# Patient Record
Sex: Male | Born: 1945
Health system: Southern US, Community
[De-identification: ages and names within clinical notes are randomized; demographics above are authoritative.]

## PROBLEM LIST (undated history)

## (undated) DIAGNOSIS — E119 Type 2 diabetes mellitus without complications: Secondary | ICD-10-CM

## (undated) DIAGNOSIS — I951 Orthostatic hypotension: Secondary | ICD-10-CM

## (undated) DIAGNOSIS — E669 Obesity, unspecified: Secondary | ICD-10-CM

## (undated) DIAGNOSIS — N049 Nephrotic syndrome with unspecified morphologic changes: Secondary | ICD-10-CM

## (undated) DIAGNOSIS — IMO0001 Reserved for inherently not codable concepts without codable children: Secondary | ICD-10-CM

## (undated) DIAGNOSIS — IMO0002 Reserved for concepts with insufficient information to code with codable children: Secondary | ICD-10-CM

## (undated) DIAGNOSIS — J45909 Unspecified asthma, uncomplicated: Secondary | ICD-10-CM

## (undated) DIAGNOSIS — Z8739 Personal history of other diseases of the musculoskeletal system and connective tissue: Secondary | ICD-10-CM

## (undated) DIAGNOSIS — I272 Pulmonary hypertension, unspecified: Secondary | ICD-10-CM

## (undated) DIAGNOSIS — I10 Essential (primary) hypertension: Secondary | ICD-10-CM

## (undated) DIAGNOSIS — R0602 Shortness of breath: Secondary | ICD-10-CM

## (undated) DIAGNOSIS — J45998 Other asthma: Secondary | ICD-10-CM

## (undated) DIAGNOSIS — E785 Hyperlipidemia, unspecified: Secondary | ICD-10-CM

## (undated) DIAGNOSIS — R001 Bradycardia, unspecified: Secondary | ICD-10-CM

## (undated) DIAGNOSIS — I38 Endocarditis, valve unspecified: Secondary | ICD-10-CM

## (undated) DIAGNOSIS — G4730 Sleep apnea, unspecified: Secondary | ICD-10-CM

## (undated) DIAGNOSIS — K5900 Constipation, unspecified: Secondary | ICD-10-CM

## (undated) DIAGNOSIS — N184 Chronic kidney disease, stage 4 (severe): Secondary | ICD-10-CM

## (undated) DIAGNOSIS — I493 Ventricular premature depolarization: Secondary | ICD-10-CM

## (undated) DIAGNOSIS — R609 Edema, unspecified: Secondary | ICD-10-CM

## (undated) DIAGNOSIS — M2550 Pain in unspecified joint: Secondary | ICD-10-CM

## (undated) DIAGNOSIS — M1990 Unspecified osteoarthritis, unspecified site: Secondary | ICD-10-CM

## (undated) DIAGNOSIS — M25519 Pain in unspecified shoulder: Secondary | ICD-10-CM

## (undated) DIAGNOSIS — I4891 Unspecified atrial fibrillation: Secondary | ICD-10-CM

## (undated) DIAGNOSIS — C609 Malignant neoplasm of penis, unspecified: Secondary | ICD-10-CM

## (undated) HISTORY — DX: Obesity, unspecified: E66.9

## (undated) HISTORY — DX: Pulmonary hypertension, unspecified: I27.20

## (undated) HISTORY — PX: BUNIONECTOMY WITH HAMMERTOE RECONSTRUCTION: SHX5600

## (undated) HISTORY — DX: Chronic kidney disease, stage 4 (severe): N18.4

## (undated) HISTORY — DX: Bradycardia, unspecified: R00.1

## (undated) HISTORY — PX: CATARACT EXTRACTION: SUR2

## (undated) HISTORY — DX: Shortness of breath: R06.02

## (undated) HISTORY — DX: Ventricular premature depolarization: I49.3

## (undated) HISTORY — PX: HERNIA REPAIR: SHX51

## (undated) HISTORY — DX: Nephrotic syndrome with unspecified morphologic changes: N04.9

## (undated) HISTORY — DX: Pain in unspecified shoulder: M25.519

## (undated) HISTORY — DX: Essential (primary) hypertension: I10

## (undated) HISTORY — DX: Unspecified asthma, uncomplicated: J45.909

## (undated) HISTORY — DX: Endocarditis, valve unspecified: I38

## (undated) HISTORY — DX: Orthostatic hypotension: I95.1

## (undated) HISTORY — DX: Edema, unspecified: R60.9

## (undated) HISTORY — DX: Pain in unspecified joint: M25.50

## (undated) HISTORY — DX: Unspecified atrial fibrillation: I48.91

## (undated) HISTORY — DX: Constipation, unspecified: K59.00

## (undated) HISTORY — PX: SQUAMOUS CELL CARCINOMA EXCISION: SHX2433

## (undated) HISTORY — DX: Sleep apnea, unspecified: G47.30

## (undated) HISTORY — DX: Reserved for inherently not codable concepts without codable children: IMO0001

---

## 2000-10-11 ENCOUNTER — Encounter: Admission: RE | Admit: 2000-10-11 | Discharge: 2001-01-09 | Payer: Self-pay | Admitting: Family Medicine

## 2001-05-16 ENCOUNTER — Encounter: Admission: RE | Admit: 2001-05-16 | Discharge: 2001-08-14 | Payer: Self-pay | Admitting: Family Medicine

## 2001-10-31 ENCOUNTER — Encounter: Admission: RE | Admit: 2001-10-31 | Discharge: 2002-01-29 | Payer: Self-pay | Admitting: Family Medicine

## 2002-02-19 ENCOUNTER — Encounter: Admission: RE | Admit: 2002-02-19 | Discharge: 2002-05-20 | Payer: Self-pay | Admitting: Family Medicine

## 2002-05-21 ENCOUNTER — Encounter: Admission: RE | Admit: 2002-05-21 | Discharge: 2002-08-19 | Payer: Self-pay | Admitting: Family Medicine

## 2002-09-16 ENCOUNTER — Ambulatory Visit (HOSPITAL_COMMUNITY): Admission: RE | Admit: 2002-09-16 | Discharge: 2002-09-16 | Payer: Self-pay | Admitting: Gastroenterology

## 2005-11-04 ENCOUNTER — Ambulatory Visit (HOSPITAL_BASED_OUTPATIENT_CLINIC_OR_DEPARTMENT_OTHER): Admission: RE | Admit: 2005-11-04 | Discharge: 2005-11-04 | Payer: Self-pay | Admitting: Surgery

## 2005-11-04 HISTORY — PX: UMBILICAL HERNIA REPAIR: SHX196

## 2008-05-07 ENCOUNTER — Encounter: Admission: RE | Admit: 2008-05-07 | Discharge: 2008-05-07 | Payer: Self-pay | Admitting: Nephrology

## 2008-05-16 ENCOUNTER — Ambulatory Visit (HOSPITAL_COMMUNITY): Admission: RE | Admit: 2008-05-16 | Discharge: 2008-05-16 | Payer: Self-pay | Admitting: Nephrology

## 2008-05-16 ENCOUNTER — Encounter (INDEPENDENT_AMBULATORY_CARE_PROVIDER_SITE_OTHER): Payer: Self-pay | Admitting: Interventional Radiology

## 2010-08-16 LAB — CBC
Hemoglobin: 16.4 g/dL (ref 13.0–17.0)
RBC: 5.37 MIL/uL (ref 4.22–5.81)
WBC: 5.6 10*3/uL (ref 4.0–10.5)

## 2010-08-16 LAB — GLUCOSE, CAPILLARY
Glucose-Capillary: 93 mg/dL (ref 70–99)
Glucose-Capillary: 98 mg/dL (ref 70–99)

## 2010-08-16 LAB — PROTIME-INR
INR: 1 (ref 0.00–1.49)
Prothrombin Time: 13.4 seconds (ref 11.6–15.2)

## 2010-08-16 LAB — APTT: aPTT: 32 seconds (ref 24–37)

## 2010-09-17 NOTE — Op Note (Signed)
   NAME:  DRADEN, RUMLEY NO.:  [id number]   MEDICAL RECORD NO.:  [id number]                   PATIENT TYPE:  AMB   LOCATION:  ENDO                                 FACILITY:  [facility]   PHYSICIAN:  Earle Gell, M.D.                DATE OF BIRTH:  [birth year]   DATE OF PROCEDURE:  09/16/2002  DATE OF DISCHARGE:                                 OPERATIVE REPORT   PROCEDURE:  Colonoscopy.   INDICATIONS FOR PROCEDURE:  Mr. Mark Singh is a 66 year old male  born 04-Mar-1946. Mr. Marchetta is scheduled to undergo his first  screening colonoscopy with polypectomy to prevent colon cancer.   ENDOSCOPIST:  Garlan Fair, M.D.   PREMEDICATION:  Versed 7.5 mg, Demerol 50 mg .   DESCRIPTION OF PROCEDURE:  After obtaining informed consent, Mr. Shawhan  was placed in the left lateral decubitus position. I administered  intravenous Demerol and intravenous Versed to achieve conscious sedation for  the procedure. The patient's blood pressure, oxygen saturation and cardiac  rhythm were monitored throughout the procedure and documented in the medical  record.   Anal inspection was normal. Digital rectal exam was normal. The Olympus  adult colonoscope was introduced into the rectum and advanced to the cecum.  A normal appearing ileocecal valve was intubated and the distal ileum  inspected.  Colonic preparation for the exam today was excellent.   RECTUM:  Normal.   SIGMOID COLON AND DESCENDING COLON:  Normal.   SPLENIC FLEXURE:  Normal.   TRANSVERSE COLON:  Normal.   HEPATIC FLEXURE:  Normal.   ASCENDING COLON:  Normal.   CECUM AND ILEOCECAL VALVE:  Normal.   DISTAL ILEUM:  Normal.    ASSESSMENT:  Normal screening proctocolonoscopy to the cecum with inspection  of the distal ileum. No endoscopic evidence for the presence of colorectal  neoplasia.                                               Earle Gell, M.D.    MJ/MEDQ   D:  09/16/2002  T:  09/16/2002  Job:  [id number]   cc:   Darletta Moll. Bubba Hales, M.D.  [address]  [place]  [place] [address]  Fax: [phone]

## 2010-09-17 NOTE — Op Note (Signed)
NAME:  Mark Singh, Mark Singh NO.:  [id number]   MEDICAL RECORD NO.:  [id number]          PATIENT TYPE:  AMB   LOCATION:  [facility]                         FACILITY:  [facility]   PHYSICIAN:  Thomas A. Cornett, M.D.DATE OF BIRTH:  Mar 03, 1946   DATE OF PROCEDURE:  11/04/2005  DATE OF DISCHARGE:                                 OPERATIVE REPORT   PREOPERATIVE DIAGNOSIS:  Umbilical hernia.   POSTOP DIAGNOSIS:  Umbilical hernia.   PROCEDURE:  Umbilical hernia pair with the mesh.   SURGEON:  Erroll Luna, M.D.   ANESTHESIA:  LMA with 10 mL of 0.5% Sensorcaine local with epinephrine.   ESTIMATED BLOOD LOSS:  10 mL.   SPECIMENS:  None.   INDICATIONS FOR PROCEDURE:  The patient is a 65 year old male with a  umbilical hernia.  It was reducible but it was giving him symptoms and he  wished to have  it repaired.  I discussed the procedure with him, as well as  potential complications and he understood and agreed to proceed.   DESCRIPTION OF PROCEDURE:  The patient was brought to the operating room,  placed supine.  After induction of general endotracheal anesthesia, the  abdomen was prepped and draped in sterile fashion.  I infiltrated the skin  below his umbilicus with 10 mL of  0.5% Sensorcaine.  Incision was made.  Dissection was carried down and I was able to mobilize the umbilicus off the  fascia.  There was a roughly 1-cm defect noted.  I was able to clear away  the fascial edges well.  I placed my finger into the fascial defect and  swept away tissue from under the fascia for  mesh placement.  A 1.7 x 1.7  inch Ventralex mesh was brought on the field.  The smooth side was placed  taken toward the peritoneal cavity.  I then pulled the mesh up snug to the  undersurface of the fascia.  Four sutures of 0 Novofil were used to hold the  mesh in place.  I then closed the fascia mesh with 0 Novofil to cover it.  I  then used a 3-0 Vicryl to  tack the umbilicus down to the fascia.   4-0  Monocryl was used to close the  skin.  All sponge, needle, and instruments were found be correct for this  portion of the case.  Sterile dressings were applied.  The patient was awoke  and taken to recovery in satisfactory condition.      Thomas A. Cornett, M.D.  Electronically Signed     TAC/MEDQ  D:  11/04/2005  T:  11/04/2005  Job:  [id number]   cc:   Darletta Moll. Bubba Hales, M.D.  Fax: [phone]

## 2012-11-19 ENCOUNTER — Encounter (HOSPITAL_BASED_OUTPATIENT_CLINIC_OR_DEPARTMENT_OTHER): Payer: Self-pay | Admitting: *Deleted

## 2012-11-19 ENCOUNTER — Other Ambulatory Visit: Payer: Self-pay | Admitting: Urology

## 2012-11-21 ENCOUNTER — Encounter (HOSPITAL_BASED_OUTPATIENT_CLINIC_OR_DEPARTMENT_OTHER): Payer: Self-pay | Admitting: *Deleted

## 2012-11-21 NOTE — Progress Notes (Signed)
NPO AFTER MN. ARRIVES AT 0600. NEEDS ISTAT AND EKG. 

## 2012-11-26 NOTE — Anesthesia Preprocedure Evaluation (Addendum)
Anesthesia Evaluation  Patient identified by MRN, date of birth, ID band Patient awake    Reviewed: Allergy & Precautions, H&P , NPO status , Patient's Chart, lab work & pertinent test results  Airway Mallampati: II TM Distance: >3 FB Neck ROM: full    Dental no notable dental hx. (+) Teeth Intact and Dental Advisory Given   Pulmonary asthma ,  Seasonal asthma breath sounds clear to auscultation  Pulmonary exam normal       Cardiovascular Exercise Tolerance: Good negative cardio ROS  Rhythm:regular Rate:Normal     Neuro/Psych negative neurological ROS  negative psych ROS   GI/Hepatic negative GI ROS, Neg liver ROS,   Endo/Other  diabetes, Well Controlled, Type 2, Oral Hypoglycemic Agents  Renal/GU negative Renal ROS  negative genitourinary   Musculoskeletal   Abdominal   Peds  Hematology negative hematology ROS (+)   Anesthesia Other Findings   Reproductive/Obstetrics negative OB ROS                         Anesthesia Physical Anesthesia Plan  ASA: III  Anesthesia Plan: General   Post-op Pain Management:    Induction: Intravenous  Airway Management Planned: LMA  Additional Equipment:   Intra-op Plan:   Post-operative Plan:   Informed Consent: I have reviewed the patients History and Physical, chart, labs and discussed the procedure including the risks, benefits and alternatives for the proposed anesthesia with the patient or authorized representative who has indicated his/her understanding and acceptance.   Dental Advisory Given  Plan Discussed with: CRNA and Surgeon  Anesthesia Plan Comments:         Anesthesia Quick Evaluation

## 2012-11-26 NOTE — H&P (Signed)
History of Present Illness        Patient seen in consult for foreskin mass referred by Dr. Kathyrn Lass Jul 2014. He has noticed a growth on the foreskin about the size of a pencil eraser for about 6 months. He tried nystatin and triamcinolone ointment with no improvement. He hasnt noted anything to make it worse. There are no associated sign or symptoms. He saw Dr. Terance Hart in 2009 for penile irritation of the glans penis that had occured since 2005. He had been treated with steroids and nystatin cream. He had cracking and splitting of the foreskin.   He has a good flow. No dysuria or hematuria. No excessive frequency or urgency. He has occasional urgency after lasix.   He works as a Development worker, international aid. He is here with his wife, they are not sexually active. He has had squamous cell ca.    Past Medical History Problems  1. History of  Arthritis V13.4 2. History of  Asthma 493.90 3. History of  Diabetes Mellitus 250.00 4. History of  Gout 274.9 5. History of  Hypercholesterolemia 272.0 6. History of  Renal Disease 593.9 7. History of  Skin Cancer V10.83  Surgical History Problems  1. History of  Biopsy Skin 2. History of  Foot Surgery Left 3. History of  Foot Surgery Left 4. History of  Umbilical Hernia Repair  Current Meds 1. Advair Diskus 100-50 MCG/DOSE MISC; Therapy: (Recorded:10Dec2009) to 2. Aspirin 81 MG Oral Tablet; Therapy: (Recorded:10Dec2009) to 3. Elidel 1 % External Cream; Therapy: (Recorded:10Dec2009) to 4. Furosemide 80 MG Oral Tablet; Therapy: 14Apr2014 to 5. MetFORMIN HCl ER 500 MG Oral Tablet Extended Release 24 Hour; Therapy:  (Recorded:21Jul2014) to 6. Nystatin-Triamcinolone CREA; Therapy: (Recorded:10Dec2009) to 7. Probenecid 500 MG Oral Tablet; Therapy: [id number] to 8. Vytorin 10-40 MG Oral Tablet; Therapy: (Recorded:10Dec2009) to  Allergies Medication  1. No Known Drug Allergies  Family History Problems  1. Fraternal history of  Acute Myocardial Infarction  V17.3 2. Sororal history of  Arthritis V17.7 3. Fraternal history of  Arthritis V17.7 4. Paternal history of  Death In The Family Father StrokeAge 10-04-1972. Maternal history of  Death In The Family Mother Complications from broken hipAge 84 6. Fraternal history of  Diabetes Mellitus V18.0 7. Sororal history of  Diabetes Mellitus V18.0 8. Family history of  Family Health Status Number Of Children No children 9. Maternal history of  Reported Previous Cardiac Problems 10. Paternal history of  Stroke Syndrome V17.1  Social History Problems    Daily Cola Consumption (___ Cans/Day) 3 per day   Marital History - Currently Married   Occupation: Landscaper Denied    History of  Alcohol Use   History of  Tobacco Use  Review of Systems Genitourinary, constitutional, skin, eye, otolaryngeal, hematologic/lymphatic, cardiovascular, pulmonary, endocrine, musculoskeletal, gastrointestinal, neurological and psychiatric system(s) were reviewed and pertinent findings if present are noted.  Hematologic/Lymphatic: a tendency to easily bruise.  Respiratory: shortness of breath.  Musculoskeletal: joint pain.  Neurological: dizziness.    Vitals Vital Signs [Data Includes: Last 1 Day]  21Jul2014 09:52AM  BMI Calculated: 36.14 BSA Calculated: 2.25 Height: 5 ft 9 in Weight: 244 lb  Blood Pressure: 139 / 81 Temperature: 98 F Heart Rate: 60  Physical Exam Constitutional: Well nourished and well developed . No acute distress.  ENT:. The ears and nose are normal in appearance.  Neck: The appearance of the neck is normal and no neck mass is present.  Pulmonary: No respiratory distress and normal respiratory  rhythm and effort.  Cardiovascular: Heart rate and rhythm are normal . No peripheral edema.  Abdomen: The abdomen is soft and nontender. No masses are palpated. No CVA tenderness. No hernias are palpable. No hepatosplenomegaly noted.  Rectal: Rectal exam demonstrates normal sphincter tone, no  tenderness and no masses. The prostate has no nodularity and is not tender. The left seminal vesicle is nonpalpable. The right seminal vesicle is nonpalpable. The perineum is normal on inspection.  Genitourinary: Examination of the penis demonstrates no discharge, no masses and a normal meatus. Penile lesion: Multiple papule(s) noted.  Large warty lesion on left dorsal foreskin, 1-2 cm, raised possbile involvement of glans penis but difficult to tell with some phimosis. Glans has some erythema and possible mass. The scrotum is without lesions. The right epididymis is palpably normal and non-tender. The left epididymis is palpably normal and non-tender. The right testis is non-tender and without masses. The left testis is non-tender and without masses.  Lymphatics: The femoral and inguinal nodes are not enlarged or tender.  Skin: Normal skin turgor, no visible rash and no visible skin lesions.  Neuro/Psych:. Mood and affect are appropriate.    Results/Data Urine [Data Includes: Last 1 Day]   [id number]  COLOR YELLOW   APPEARANCE CLEAR   SPECIFIC GRAVITY 1.020   pH 5.5   GLUCOSE NEG mg/dL  BILIRUBIN NEG   KETONE NEG mg/dL  BLOOD NEG   PROTEIN NEG mg/dL  UROBILINOGEN 0.2 mg/dL  NITRITE NEG   LEUKOCYTE ESTERASE NEG    Assessment Assessed  1. Phimosis 605 2. Penile Neoplasm 239.5  Plan Health Maintenance (V70.0)  1. UA With REFLEX  Done: [id number] 09:33AM Penile Neoplasm (239.5)  2. Follow-up Schedule Surgery Office  Follow-up  Requested for: 21Jul2014 3. Get Outside Records Office  Follow-up  Requested for: [phone]  Discussion/Summary   Discussed with patient and his wife this is concerning for penile cancer but could be a benign lesion such as a large condyloma.  Plan mass excision, possible circumcision, poss frozen, poss excision of glans mass/tumor. Discussed risks such as poor cosmesis and sexual dysfunction postoperatively among others. Discussed may need multiple procedures  depending on path, referral to tertiary referral center. I didn' t note any inguinal LAD.       Signatures Electronically signed by : Festus Aloe, M.D.; Nov 19 2012 11:01AM

## 2012-11-27 ENCOUNTER — Encounter (HOSPITAL_BASED_OUTPATIENT_CLINIC_OR_DEPARTMENT_OTHER)
Admission: RE | Disposition: A | Discharge status: Home / Self Care | Payer: Self-pay | Source: Ambulatory Visit | Attending: Urology

## 2012-11-27 ENCOUNTER — Ambulatory Visit (HOSPITAL_BASED_OUTPATIENT_CLINIC_OR_DEPARTMENT_OTHER)
Admission: RE | Admit: 2012-11-27 | Discharge: 2012-11-27 | Disposition: A | Discharge status: Home / Self Care | Payer: Medicare Other | Source: Ambulatory Visit | Attending: Urology | Admitting: Urology

## 2012-11-27 ENCOUNTER — Other Ambulatory Visit: Payer: Self-pay

## 2012-11-27 ENCOUNTER — Encounter (HOSPITAL_BASED_OUTPATIENT_CLINIC_OR_DEPARTMENT_OTHER): Payer: Self-pay | Admitting: *Deleted

## 2012-11-27 ENCOUNTER — Encounter (HOSPITAL_BASED_OUTPATIENT_CLINIC_OR_DEPARTMENT_OTHER): Payer: Self-pay | Admitting: Anesthesiology

## 2012-11-27 ENCOUNTER — Ambulatory Visit (HOSPITAL_BASED_OUTPATIENT_CLINIC_OR_DEPARTMENT_OTHER): Payer: Medicare Other | Admitting: Anesthesiology

## 2012-11-27 DIAGNOSIS — M109 Gout, unspecified: Secondary | ICD-10-CM | POA: Insufficient documentation

## 2012-11-27 DIAGNOSIS — Z79899 Other long term (current) drug therapy: Secondary | ICD-10-CM | POA: Insufficient documentation

## 2012-11-27 DIAGNOSIS — N476 Balanoposthitis: Secondary | ICD-10-CM | POA: Insufficient documentation

## 2012-11-27 DIAGNOSIS — J45909 Unspecified asthma, uncomplicated: Secondary | ICD-10-CM | POA: Insufficient documentation

## 2012-11-27 DIAGNOSIS — Z85828 Personal history of other malignant neoplasm of skin: Secondary | ICD-10-CM | POA: Insufficient documentation

## 2012-11-27 DIAGNOSIS — C600 Malignant neoplasm of prepuce: Secondary | ICD-10-CM | POA: Insufficient documentation

## 2012-11-27 DIAGNOSIS — E119 Type 2 diabetes mellitus without complications: Secondary | ICD-10-CM | POA: Insufficient documentation

## 2012-11-27 DIAGNOSIS — C601 Malignant neoplasm of glans penis: Secondary | ICD-10-CM | POA: Insufficient documentation

## 2012-11-27 DIAGNOSIS — Z7982 Long term (current) use of aspirin: Secondary | ICD-10-CM | POA: Insufficient documentation

## 2012-11-27 DIAGNOSIS — E7800 Pure hypercholesterolemia, unspecified: Secondary | ICD-10-CM | POA: Insufficient documentation

## 2012-11-27 HISTORY — DX: Personal history of other diseases of the musculoskeletal system and connective tissue: Z87.39

## 2012-11-27 HISTORY — DX: Unspecified osteoarthritis, unspecified site: M19.90

## 2012-11-27 HISTORY — DX: Type 2 diabetes mellitus without complications: E11.9

## 2012-11-27 HISTORY — DX: Other asthma: J45.998

## 2012-11-27 HISTORY — PX: CIRCUMCISION: SHX1350

## 2012-11-27 HISTORY — DX: Hyperlipidemia, unspecified: E78.5

## 2012-11-27 LAB — GLUCOSE, CAPILLARY: Glucose-Capillary: 91 mg/dL (ref 70–99)

## 2012-11-27 LAB — POCT I-STAT 4, (NA,K, GLUC, HGB,HCT)
Glucose, Bld: 107 mg/dL — ABNORMAL HIGH (ref 70–99)
Potassium: 3.6 mEq/L (ref 3.5–5.1)
Sodium: 142 mEq/L (ref 135–145)

## 2012-11-27 SURGERY — CIRCUMCISION, ADULT
Anesthesia: General | Site: Penis | Wound class: Clean

## 2012-11-27 MED ORDER — BUPIVACAINE HCL 0.25 % IJ SOLN
INTRAMUSCULAR | Status: DC | PRN
  Administered 2012-11-27: 10 mL

## 2012-11-27 MED ORDER — PROPOFOL 10 MG/ML IV BOLUS
INTRAVENOUS | Status: DC | PRN
  Administered 2012-11-27: 150 mg via INTRAVENOUS

## 2012-11-27 MED ORDER — ONDANSETRON HCL 4 MG/2ML IJ SOLN
INTRAMUSCULAR | Status: DC | PRN
  Administered 2012-11-27: 4 mg via INTRAVENOUS

## 2012-11-27 MED ORDER — LACTATED RINGERS IV SOLN
INTRAVENOUS | Status: DC
  Administered 2012-11-27: 07:00:00 via INTRAVENOUS
  Filled 2012-11-27: qty 1000

## 2012-11-27 MED ORDER — MIDAZOLAM HCL 5 MG/5ML IJ SOLN
INTRAMUSCULAR | Status: DC | PRN
  Administered 2012-11-27: 1 mg via INTRAVENOUS

## 2012-11-27 MED ORDER — OXYCODONE-ACETAMINOPHEN 5-325 MG PO TABS: 1.0000 | ORAL_TABLET | ORAL | Status: DC | PRN

## 2012-11-27 MED ORDER — CEFAZOLIN SODIUM-DEXTROSE 2-3 GM-% IV SOLR
2.0000 g | INTRAVENOUS | Status: AC
  Administered 2012-11-27: 2 g via INTRAVENOUS
  Filled 2012-11-27: qty 50

## 2012-11-27 MED ORDER — FENTANYL CITRATE 0.05 MG/ML IJ SOLN
25.0000 ug | INTRAMUSCULAR | Status: DC | PRN
  Filled 2012-11-27: qty 1

## 2012-11-27 MED ORDER — FENTANYL CITRATE 0.05 MG/ML IJ SOLN
INTRAMUSCULAR | Status: DC | PRN
  Administered 2012-11-27: 25 ug via INTRAVENOUS
  Administered 2012-11-27: 50 ug via INTRAVENOUS
  Administered 2012-11-27: 25 ug via INTRAVENOUS

## 2012-11-27 MED ORDER — LIDOCAINE HCL (CARDIAC) 20 MG/ML IV SOLN
INTRAVENOUS | Status: DC | PRN
  Administered 2012-11-27: 50 mg via INTRAVENOUS

## 2012-11-27 MED ORDER — LACTATED RINGERS IV SOLN
INTRAVENOUS | Status: DC
  Administered 2012-11-27: 10:00:00 via INTRAVENOUS
  Filled 2012-11-27: qty 1000

## 2012-11-27 MED ORDER — ASPIRIN 81 MG PO TABS: 81.0000 mg | ORAL_TABLET | Freq: Every day | ORAL | Status: DC

## 2012-11-27 SURGICAL SUPPLY — 28 items
BANDAGE CONFORM 2  STR LF (GAUZE/BANDAGES/DRESSINGS) ×2 IMPLANT
BLADE SURG 15 STRL LF DISP TIS (BLADE) ×1 IMPLANT
BLADE SURG 15 STRL SS (BLADE) ×1
BNDG COHESIVE 1X5 TAN STRL LF (GAUZE/BANDAGES/DRESSINGS) ×2 IMPLANT
CLOTH BEACON ORANGE TIMEOUT ST (SAFETY) ×2 IMPLANT
COVER MAYO STAND STRL (DRAPES) ×2 IMPLANT
COVER TABLE BACK 60X90 (DRAPES) ×2 IMPLANT
DRAIN PENROSE 18X1/4 LTX STRL (WOUND CARE) ×2 IMPLANT
DRAPE PED LAPAROTOMY (DRAPES) ×2 IMPLANT
ELECT REM PT RETURN 9FT ADLT (ELECTROSURGICAL) ×2
ELECTRODE REM PT RTRN 9FT ADLT (ELECTROSURGICAL) ×1 IMPLANT
GAUZE VASELINE 1X8 (GAUZE/BANDAGES/DRESSINGS) ×2 IMPLANT
GLOVE BIO SURGEON STRL SZ7.5 (GLOVE) ×2 IMPLANT
GLOVE ECLIPSE 6.5 STRL STRAW (GLOVE) ×2 IMPLANT
GLOVE INDICATOR 6.5 STRL GRN (GLOVE) ×2 IMPLANT
GOWN PREVENTION PLUS LG XLONG (DISPOSABLE) ×2 IMPLANT
GOWN STRL REIN XL XLG (GOWN DISPOSABLE) ×2 IMPLANT
NEEDLE HYPO 25X1 1.5 SAFETY (NEEDLE) ×2 IMPLANT
NS IRRIG 500ML POUR BTL (IV SOLUTION) ×2 IMPLANT
PACK BASIN DAY SURGERY FS (CUSTOM PROCEDURE TRAY) ×2 IMPLANT
PENCIL BUTTON HOLSTER BLD 10FT (ELECTRODE) ×2 IMPLANT
SUT CHROMIC 3 0 PS 2 (SUTURE) IMPLANT
SUT CHROMIC 3 0 SH 27 (SUTURE) ×4 IMPLANT
SUT CHROMIC 4 0 RB 1X27 (SUTURE) ×2 IMPLANT
SUT SILK 2 0 (SUTURE)
SUT SILK 2-0 18XBRD TIE 12 (SUTURE) IMPLANT
SYR CONTROL 10ML LL (SYRINGE) ×2 IMPLANT
WATER STERILE IRR 500ML POUR (IV SOLUTION) IMPLANT

## 2012-11-27 NOTE — Interval H&P Note (Signed)
History and Physical Interval Note:  11/27/2012 7:26 AM  Mark Singh  has presented today for surgery, with the diagnosis of penile neoplasm  The various methods of treatment have been discussed with the patient and family. After consideration of risks, benefits and other options for treatment, the patient has consented to  Procedure(s): [place]  (N/A) as a surgical intervention .  The patient's history has been reviewed, patient examined, no change in status, stable for surgery.  I have reviewed the patient's chart and labs.  Questions were answered to the patient's satisfaction.  I told him we'd probably perform a circumcision with some other biopsies to stage the process. We may or may not need frozens. Istat is normal.    Fredricka Bonine

## 2012-11-27 NOTE — Op Note (Signed)
Preop diagnosis: #1 foreskin neoplasm #2 glans neoplasm #3 severe balanoposthitis  Postop diagnosis: Same  Procedure: #1 excisional biopsy of glans #2 circumcision including foreskin neoplasm #3 exam under anesthesia  Surgeon: Vyctoria Dickman Type of anesthesia: Gen. with a penile block  Findings: There was severe balanoposthitis with complete fusion of the preputial surface covering the back 1/3 to 1/2 of the glans dorsally and no real plain could be made here. There was a raised plaque-like lesion with a fine papillary appearance on the glans. In line with this glans mass and touching it was a raised keratinized warty lesion of the foreskin. There was some remaining significant inflammation and erythema of the glans penis and some of the preputial skin but this did not appear to be neoplastic (there were no well marginated velvety red areas).  There was no palpable inguinal lymphadenopathy.  Description of procedure: After consent was obtained patient brought to the operating room. After adequate anesthesia he was prepped and draped in the usual fashion. A timeout was performed to confirm the patient and procedure. Along this fused to preputial dorsal surface of the glans I developed a plane sharply and bluntly to began coming under the penile skin. A 10 cc Marcaine dorsal penile block was given to the proximal penis. A light tourniquet was placed with a Penrose. There was again a crusty plaque-like lesion on the glans which was incised elliptically and removed. There was another area of suspicion contiguous with this and this was excised. These were sent as specimens 1 and 2. The wound was irrigated. The glans defect was then run closed with a 4-0 chromic suture.   Next I marked a line for the circumcision. The proximal incision was marked proximal to the suspicious foreskin lesion, and around more distal on the ventral surface. The mucosal marked included all the epithelium visible on the dorsal  glans to include the plain I developed earlier to come under the lesion. Laterally on both sides I was able to incorporate some normal foreskin and the incision down ventrally. The distal incision was made. Then the proximal incision. I started ventrally to develop a plane under the dartos fascia right on top of the urethra and corporal bodies more laterally. This plane was brought up dorsally with cautery dissection as well as sharp dissection.  I was able to completely come under the concerning lesion. The foreskin mass and foreskin were sent to path as #3. In the area where the mucosal surface was fused to the glans under the foreskin mass I took some more tissue as a deep margin as #4. The tourniquet was released. Hemostasis was obtained with the Bovie cautery and was excellent. The wound was irrigated. The penile skin and preputial skin was then reapproximated with interrupted 4-0 chromic. More dorsally the penile skin was attached directly to the glans again as this area suffered complete fusion.   A dressing of Neosporin, Vaseline gauze and Kling wrap was placed. The drapes were removed the patient was cleaned. An exam under anesthesia the inguinal nodes was performed.  Complications: None  Blood loss: Minimal  Drains: None   Specimens:  #1 glans lesion  #2 glans lesion  #3 foreskin lesion and foreskin from circumcision  #4 deep margin of  Foreskin  Disposition: Patient stable to PACU.

## 2012-11-27 NOTE — Transfer of Care (Signed)
Immediate Anesthesia Transfer of Care Note  Patient: Mark Singh  Procedure(s) Performed: Procedure(s):  CIRCUMCISION  AND EXCISION OF GLANS PENIS (N/A)  Patient Location: PACU  Anesthesia Type:General  Level of Consciousness: sedated  Airway & Oxygen Therapy: Patient Spontanous Breathing and Patient connected to nasal cannula oxygen  Post-op Assessment: Report given to PACU RN and Post -op Vital signs reviewed and stable  Post vital signs: stable  Complications: No apparent anesthesia complications

## 2012-11-27 NOTE — Anesthesia Postprocedure Evaluation (Signed)
  Anesthesia Post-op Note  Patient: Mark Singh  Procedure(s) Performed: Procedure(s) (LRB):  CIRCUMCISION  AND EXCISION OF GLANS PENIS (N/A)  Patient Location: PACU  Anesthesia Type: General  Level of Consciousness: awake and alert   Airway and Oxygen Therapy: Patient Spontanous Breathing  Post-op Pain: mild  Post-op Assessment: Post-op Vital signs reviewed, Patient's Cardiovascular Status Stable, Respiratory Function Stable, Patent Airway and No signs of Nausea or vomiting  Last Vitals:  Filed Vitals:   11/27/12 0945  BP: 105/92  Pulse: 60  Temp:   Resp: 13    Post-op Vital Signs: stable   Complications: No apparent anesthesia complications

## 2012-11-28 ENCOUNTER — Encounter (HOSPITAL_BASED_OUTPATIENT_CLINIC_OR_DEPARTMENT_OTHER): Payer: Self-pay | Admitting: Urology

## 2012-12-03 ENCOUNTER — Other Ambulatory Visit: Payer: Self-pay | Admitting: Dermatology

## 2012-12-05 ENCOUNTER — Other Ambulatory Visit: Payer: Self-pay

## 2013-03-07 ENCOUNTER — Other Ambulatory Visit: Payer: Self-pay

## 2013-04-29 ENCOUNTER — Encounter: Payer: Self-pay | Admitting: Cardiology

## 2013-04-29 DIAGNOSIS — R609 Edema, unspecified: Secondary | ICD-10-CM | POA: Insufficient documentation

## 2013-04-29 DIAGNOSIS — I10 Essential (primary) hypertension: Secondary | ICD-10-CM | POA: Insufficient documentation

## 2013-04-29 DIAGNOSIS — I951 Orthostatic hypotension: Secondary | ICD-10-CM | POA: Insufficient documentation

## 2013-05-07 ENCOUNTER — Encounter (INDEPENDENT_AMBULATORY_CARE_PROVIDER_SITE_OTHER): Payer: Medicare Other

## 2013-05-07 ENCOUNTER — Encounter: Payer: Self-pay | Admitting: *Deleted

## 2013-05-07 ENCOUNTER — Ambulatory Visit (INDEPENDENT_AMBULATORY_CARE_PROVIDER_SITE_OTHER): Payer: Medicare Other | Admitting: Cardiology

## 2013-05-07 ENCOUNTER — Encounter: Payer: Self-pay | Admitting: Cardiology

## 2013-05-07 VITALS — BP 143/58 | HR 66 | Ht 69.0 in | Wt 257.0 lb

## 2013-05-07 DIAGNOSIS — I4949 Other premature depolarization: Secondary | ICD-10-CM

## 2013-05-07 DIAGNOSIS — E669 Obesity, unspecified: Secondary | ICD-10-CM

## 2013-05-07 DIAGNOSIS — I493 Ventricular premature depolarization: Secondary | ICD-10-CM

## 2013-05-07 DIAGNOSIS — E785 Hyperlipidemia, unspecified: Secondary | ICD-10-CM

## 2013-05-07 DIAGNOSIS — I10 Essential (primary) hypertension: Secondary | ICD-10-CM

## 2013-05-07 LAB — BASIC METABOLIC PANEL
BUN: 13 mg/dL (ref 6–23)
CO2: 31 mEq/L (ref 19–32)
CREATININE: 1.4 mg/dL (ref 0.4–1.5)
Calcium: 8.7 mg/dL (ref 8.4–10.5)
Chloride: 99 mEq/L (ref 96–112)
GFR: 52.24 mL/min — AB (ref >60.00)
Glucose, Bld: 147 mg/dL — ABNORMAL HIGH (ref 70–99)
POTASSIUM: 3.9 meq/L (ref 3.5–5.1)
Sodium: 138 mEq/L (ref 135–145)

## 2013-05-07 LAB — T4, FREE: Free T4: 0.57 ng/dL — ABNORMAL LOW (ref 0.60–1.60)

## 2013-05-07 LAB — TSH: TSH: 3.45 u[IU]/mL (ref 0.35–5.50)

## 2013-05-07 NOTE — Patient Instructions (Addendum)
Your physician recommends that you continue on your current medications as directed. Please refer to the Current Medication list given to you today.  Your physician recommends that you have labs today: BMET, TSH, FreeT4   Your physician has recommended that you wear a 24 hour holter monitor. Holter monitors are medical devices that record the heart's electrical activity. Doctors most often use these monitors to diagnose arrhythmias. Arrhythmias are problems with the speed or rhythm of the heartbeat. The monitor is a small, portable device. You can wear one while you do your normal daily activities. This is usually used to diagnose what is causing palpitations/syncope (passing out).  Your physician wants you to follow-up in: 6 months with Dr. Marlou Porch. You will receive a reminder letter in the mail two months in advance. If you don't receive a letter, please call our office to schedule the follow-up appointment.  *[place] Blood Pressure Cuff

## 2013-05-07 NOTE — Progress Notes (Signed)
Patient ID: Mark Singh, male   DOB: August 26, 1945, 68 y.o.   MRN: [id number] E=Cardio 24 hour holter monitor applied to patient.

## 2013-05-07 NOTE — Progress Notes (Signed)
[facility] [address], [place]  [address] Phone: [phone] Fax:  [phone]  Date:  05/07/2013   ID:  Mark Singh, DOB Oct 18, 1945, MRN [id number]  PCP:  [name], MD   History of Present Illness: Mark Singh is a 68 y.o. male with nephrotic syndrome a complete responder to steroid therapy by Dr. Lorrene Reid, diabetes, hypertension who is here for a one-year followup. His prior echocardiogram showed a normal EF with diastolic dysfunction and dilated left atrium. He no longer complains of any excessive dyspnea or chest burning/tightness. Prior nuclear stress test was low risk.He still continues to battle with obesity and is eager to lose weight. He continues to take his cholesterol medication. He is normotensive with blood pressures running in the 120s to 130s at all doctors visits.  In 2014 had skin cancer surgery. Tired all of the time at that point. HR at that point was in the 40's, during colonoscopy. ECG showed PVC's frequent. Personally viewed EKG which shows sinus rhythm with ventricular trigeminy. Upright in aVF, upright in V3 through V6, down in aVR.    Wt Readings from Last 3 Encounters:  05/07/13 257 lb (116.574 kg)  11/27/12 246 lb (111.585 kg)  11/27/12 246 lb (111.585 kg)     Past Medical History  Diagnosis Date  . Penile neoplasm   . Hyperlipidemia   . Type 2 diabetes mellitus   . H/O: gout     right foot  . Arthritis   . Seasonal asthma     mild  . Asthma   . HTN (hypertension)   . Orthostatic hypotension   . Myalgia and myositis, unspecified   . Edema     , ECHO 04/18/08 - Nl EF 65%, mild to moderate LAE, mild diastolic dysfunction  . Nephrotic syndrome   . Back pain   . Shoulder pain     Past Surgical History  Procedure Laterality Date  . Umbilical hernia repair  11-04-2005  . Umbilical hernia repair  11-04-2005  . Left foot surgery  1992  &  2008    REMOVAL HEAL SPUR/ BUNIONECTOMY AND HAMMERTOE REPAIR X2   . Circumcision N/A 11/27/2012    Procedure:  CIRCUMCISION  AND EXCISION OF GLANS PENIS;  Surgeon: Fredricka Bonine, MD;  Location[facility];  Service: Urology;  Laterality: N/A;  . Hammer toe surgery      Current Outpatient Prescriptions  Medication Sig Dispense Refill  . aspirin 81 MG tablet Take 1 tablet (81 mg total) by mouth daily.  30 tablet  0  . Fluticasone-Salmeterol (ADVAIR DISKUS) 250-50 MCG/DOSE AEPB Inhale 1 puff into the lungs as needed.      . furosemide (LASIX) 80 MG tablet Take 40 mg by mouth daily.       . metFORMIN (GLUCOPHAGE) 500 MG tablet Take 500 mg by mouth 2 (two) times daily with a meal.      . probenecid (BENEMID) 500 MG tablet Take 500 mg by mouth 2 (two) times daily.       . simvastatin (ZOCOR) 40 MG tablet Take 40 mg by mouth daily.       No current facility-administered medications for this visit.    Allergies:    Allergies  Allergen Reactions  . Indomethacin Other (See Comments)    dizziness    Social History:  The patient  reports that he has never smoked. He has never used smokeless tobacco. He reports that  he does not drink alcohol or use illicit drugs.   ROS:  Please see the history of present illness.   Denies any syncope, stroke like symptoms, orthopnea, PND, chest pain. Occasionally he will feel some slight dizziness.  PHYSICAL EXAM: VS:  BP 143/58  Pulse 66  Ht 5\' 9"  (1.753 m)  Wt 257 lb (116.574 kg)  BMI 37.93 kg/m2 Well nourished, well developed, in no acute distress HEENT: normal Neck: no JVD Cardiac:  normal S1, S2; RRR; no murmur occasional ectopy Lungs:  clear to auscultation bilaterally, no wheezing, rhonchi or rales Abd: soft, nontender, no hepatomegalyObese Ext: no edema Skin: warm and dry Neuro: no focal abnormalities noted  EKG:  04/22/13 from Dr. Ammie Ferrier office:  ECG showed PVC's frequent. Personally viewed EKG which shows sinus rhythm with ventricular trigeminy. Upright in aVF, upright in V3  through V6, down in aVR.   ASSESSMENT AND PLAN:  1. Frequent PVCs-unifocal. No syncope. Mild dizziness may be associated with lower effective heart rate. I would like to check a 24-hour Holter monitor to ensure that there no dangerous arrhythmias and to make sure that he does not have a significant PVC burden. I will also check a TSH, free T4 and basic metabolic profile. Previous echocardiogram performed showed normal ejection fraction without any significant left ventricular hypertrophy. Prior nuclear stress test was also low risk. 2. Obesity-encourage weight loss. 3. Hypertension-currently reasonably controlled. Recommended Omron blood pressure cuff. 4. Hyperlipidemia-simvastatin. Continue.  Signed, [name], MD [facility]  05/07/2013 1:12 PM

## 2013-05-13 ENCOUNTER — Telehealth: Payer: Self-pay | Admitting: *Deleted

## 2013-05-13 MED ORDER — METOPROLOL SUCCINATE ER 25 MG PO TB24: 25.0000 mg | ORAL_TABLET | Freq: Every day | ORAL | Status: DC

## 2013-05-13 NOTE — Telephone Encounter (Signed)
Per Dr. Marlou Porch Start Metoprolol ER 25 mg 1 tab daily See him back in 1 month Called and told him the plan, Pt aware of follow up appointment  after starting medication

## 2013-06-18 ENCOUNTER — Ambulatory Visit: Payer: Medicare Other | Admitting: Cardiology

## 2013-06-28 ENCOUNTER — Ambulatory Visit (INDEPENDENT_AMBULATORY_CARE_PROVIDER_SITE_OTHER): Payer: Medicare Other | Admitting: Cardiology

## 2013-06-28 ENCOUNTER — Encounter: Payer: Self-pay | Admitting: Cardiology

## 2013-06-28 VITALS — BP 130/78 | HR 59 | Ht 69.0 in | Wt 260.0 lb

## 2013-06-28 DIAGNOSIS — I10 Essential (primary) hypertension: Secondary | ICD-10-CM

## 2013-06-28 DIAGNOSIS — I4949 Other premature depolarization: Secondary | ICD-10-CM

## 2013-06-28 DIAGNOSIS — E669 Obesity, unspecified: Secondary | ICD-10-CM

## 2013-06-28 DIAGNOSIS — I493 Ventricular premature depolarization: Secondary | ICD-10-CM

## 2013-06-28 DIAGNOSIS — E7800 Pure hypercholesterolemia, unspecified: Secondary | ICD-10-CM

## 2013-06-28 NOTE — Progress Notes (Signed)
[place]. [address], [address] [place], [place]  [address] Phone: [phone] Fax:  [phone]  Date:  06/28/2013   ID:  HAIDEN GARDE, DOB 06-Apr-1946, MRN [id number]  PCP:  [name], MD   History of Present Illness: Mark Singh is a 68 y.o. male with nephrotic syndrome a complete responder to steroid therapy by Dr. Lorrene Reid, diabetes, hypertension who is here for a one-year followup. His prior echocardiogram showed a normal EF with diastolic dysfunction and dilated left atrium. He no longer complains of any excessive dyspnea or chest burning/tightness. Prior nuclear stress test was low risk.He still continues to battle with obesity and is eager to lose weight. He continues to take his cholesterol medication. He is normotensive with blood pressures running in the 120s to 130s at all doctors visits.  In 2014 had skin cancer surgery. Tired all of the time at that point. HR at that point was in the 40's, during colonoscopy. ECG showed PVC's frequent. Personally viewed EKG which shows sinus rhythm with ventricular trigeminy. Upright in aVF, upright in V3 through V6, down in aVR.  Frequent PVCs 18000 were noted on Holter monitor. We started metoprolol extended release 25 mg once a day on 05/13/13.  Wt Readings from Last 3 Encounters:  06/28/13 260 lb (117.935 kg)  05/07/13 257 lb (116.574 kg)  11/27/12 246 lb (111.585 kg)     Past Medical History  Diagnosis Date  . Penile neoplasm   . Hyperlipidemia   . Type 2 diabetes mellitus   . H/O: gout     right foot  . Arthritis   . Seasonal asthma     mild  . Asthma   . HTN (hypertension)   . Orthostatic hypotension   . Myalgia and myositis, unspecified   . Edema     , ECHO 04/18/08 - Nl EF 65%, mild to moderate LAE, mild diastolic dysfunction  . Nephrotic syndrome   . Back pain   . Shoulder pain     Past Surgical History  Procedure Laterality Date  . Umbilical hernia repair  11-04-2005  . Umbilical  hernia repair  11-04-2005  . Left foot surgery  1992  &  2008    REMOVAL HEAL SPUR/ BUNIONECTOMY AND HAMMERTOE REPAIR X2  . Circumcision N/A 11/27/2012    Procedure:  CIRCUMCISION  AND EXCISION OF GLANS PENIS;  Surgeon: Fredricka Bonine, MD;  Location: [facility];  Service: Urology;  Laterality: N/A;  . Hammer toe surgery      Current Outpatient Prescriptions  Medication Sig Dispense Refill  . aspirin 81 MG tablet Take 1 tablet (81 mg total) by mouth daily.  30 tablet  0  . Fluticasone-Salmeterol (ADVAIR DISKUS) 250-50 MCG/DOSE AEPB Inhale 1 puff into the lungs as needed.      . furosemide (LASIX) 40 MG tablet Take 40 mg by mouth daily.      . metFORMIN (GLUCOPHAGE) 500 MG tablet Take 500 mg by mouth 2 (two) times daily with a meal.      . metoprolol succinate (TOPROL XL) 25 MG 24 hr tablet Take 1 tablet (25 mg total) by mouth daily.  90 tablet  3  . probenecid (BENEMID) 500 MG tablet Take 500 mg by mouth 2 (two) times daily.       . simvastatin (ZOCOR) 40 MG tablet Take 40 mg by mouth daily.      . vitamin B-12 (CYANOCOBALAMIN) 1000 MCG tablet Take 1,000  mcg by mouth daily.       No current facility-administered medications for this visit.    Allergies:    Allergies  Allergen Reactions  . Indomethacin Other (See Comments)    dizziness    Social History:  The patient  reports that he has never smoked. He has never used smokeless tobacco. He reports that he does not drink alcohol or use illicit drugs.   ROS:  Please see the history of present illness.   Denies any syncope, stroke like symptoms, orthopnea, PND, chest pain. Occasionally he will feel some slight dizziness.  PHYSICAL EXAM: VS:  BP 130/78  Pulse 59  Ht 5\' 9"  (1.753 m)  Wt 260 lb (117.935 kg)  BMI 38.38 kg/m2  SpO2 99% Well nourished, well developed, in no acute distress HEENT: normal Neck: no JVD Cardiac:  normal S1, S2; RRR; no murmur NO ectopy Lungs:  clear to auscultation bilaterally, no  wheezing, rhonchi or rales Abd: soft, nontender, no hepatomegalyObese Ext: no edema Skin: warm and dry Neuro: no focal abnormalities noted  EKG:  04/22/13 from Dr. Ammie Ferrier office:  ECG showed PVC's frequent. Personally viewed EKG which shows sinus rhythm with ventricular trigeminy. Upright in aVF, upright in V3 through V6, down in aVR.  Holter monitor 05/07/13-18,000 PVCs   ASSESSMENT AND PLAN:  1. Frequent PVCs-unifocal. No syncope. Much improved on low-dose metoprolol 25 mg extended release. On auscultation today I did not hear one ectopic beat. He is feeling much better. Mild dizziness previously may have been associated with lower effective heart rate. 24-hour Holter monitor-18,000 PVCs. Very frequent burden. I explained to him that suppression is important to help prevent cardiomyopathy. Previous echocardiogram performed showed normal ejection fraction without any significant left ventricular hypertrophy. Prior nuclear stress test was also low risk. TSH was normal. 2. Obesity-encourage weight loss. Fit bit. 3. Hypertension-currently reasonably controlled. Recommended Omron blood pressure cuff. 4. Hyperlipidemia-simvastatin. Continue. 5. We will see him back in 6 months.  Signed, [name], MD [facility]  06/28/2013 11:16 AM

## 2013-06-28 NOTE — Patient Instructions (Signed)
Your physician recommends that you continue on your current medications as directed. Please refer to the Current Medication list given to you today.  Your physician wants you to follow-up in: 6 months with Dr. Skains. You will receive a reminder letter in the mail two months in advance. If you don't receive a letter, please call our office to schedule the follow-up appointment.  

## 2013-10-09 ENCOUNTER — Other Ambulatory Visit: Payer: Self-pay | Admitting: Dermatology

## 2013-12-25 ENCOUNTER — Ambulatory Visit (INDEPENDENT_AMBULATORY_CARE_PROVIDER_SITE_OTHER): Payer: Medicare Other | Admitting: Cardiology

## 2013-12-25 ENCOUNTER — Encounter: Payer: Self-pay | Admitting: Cardiology

## 2013-12-25 VITALS — BP 138/78 | HR 71 | Ht 70.0 in | Wt 266.0 lb

## 2013-12-25 DIAGNOSIS — I10 Essential (primary) hypertension: Secondary | ICD-10-CM

## 2013-12-25 DIAGNOSIS — I4949 Other premature depolarization: Secondary | ICD-10-CM

## 2013-12-25 DIAGNOSIS — I493 Ventricular premature depolarization: Secondary | ICD-10-CM

## 2013-12-25 DIAGNOSIS — R9431 Abnormal electrocardiogram [ECG] [EKG]: Secondary | ICD-10-CM | POA: Insufficient documentation

## 2013-12-25 DIAGNOSIS — E669 Obesity, unspecified: Secondary | ICD-10-CM

## 2013-12-25 NOTE — Patient Instructions (Signed)
The current medical regimen is effective;  continue present plan and medications.  Your physician has requested that you have an echocardiogram. Echocardiography is a painless test that uses sound waves to create images of your heart. It provides your doctor with information about the size and shape of your heart and how well your heart's chambers and valves are working. This procedure takes approximately one hour. There are no restrictions for this procedure.  Follow up in 6 months with Dr Skains.  You will receive a letter in the mail 2 months before you are due.  Please call us when you receive this letter to schedule your follow up appointment.  

## 2013-12-25 NOTE — Progress Notes (Signed)
[place]. [address] [place], [place]  [address] Phone: [phone] Fax:  [phone]  Date:  12/25/2013   ID:  Mark Singh, DOB August 06, 1945, MRN [id number]  PCP:  [name], MD   History of Present Illness: KLARK CLERC is a 68 y.o. male with nephrotic syndrome a complete responder to steroid therapy by Dr. Lorrene Reid, diabetes, hypertension who is here for a one-year followup. His prior echocardiogram showed a normal EF with diastolic dysfunction and dilated left atrium. He no longer complains of any excessive dyspnea or chest burning/tightness. Prior nuclear stress test was low risk.He still continues to battle with obesity and is eager to lose weight. He continues to take his cholesterol medication. He is normotensive with blood pressures running in the 120s to 130s at all doctors visits.  In 2014 had skin cancer surgery. Tired all of the time at that point. HR at that point was in the 40's, during colonoscopy. ECG showed PVC's frequent. Personally viewed EKG which shows sinus rhythm with ventricular trigeminy. Upright in aVF, upright in V3 through V6, down in aVR.  Frequent PVCs 18000 were noted on Holter monitor. We started metoprolol extended release 25 mg once a day on 05/13/13.  Once again similar PVCs were seen on EKG. We discussed at length. He is walking quite well in doors. He does have an asthma-like sensation when he walks in the summer heat.  Wt Readings from Last 3 Encounters:  12/25/13 266 lb (120.657 kg)  06/28/13 260 lb (117.935 kg)  05/07/13 257 lb (116.574 kg)     Past Medical History  Diagnosis Date  . Penile neoplasm   . Hyperlipidemia   . Type 2 diabetes mellitus   . H/O: gout     right foot  . Arthritis   . Seasonal asthma     mild  . Asthma   . HTN (hypertension)   . Orthostatic hypotension   . Myalgia and myositis, unspecified   . Edema     , ECHO 04/18/08 - Nl EF 65%, mild to moderate LAE, mild diastolic  dysfunction  . Nephrotic syndrome   . Back pain   . Shoulder pain     Past Surgical History  Procedure Laterality Date  . Umbilical hernia repair  11-04-2005  . Umbilical hernia repair  11-04-2005  . Left foot surgery  1992  &  2008    REMOVAL HEAL SPUR/ BUNIONECTOMY AND HAMMERTOE REPAIR X2  . Circumcision N/A 11/27/2012    Procedure:  CIRCUMCISION  AND EXCISION OF GLANS PENIS;  Surgeon: Fredricka Bonine, MD;  Location: [facility];  Service: Urology;  Laterality: N/A;  . Hammer toe surgery      Current Outpatient Prescriptions  Medication Sig Dispense Refill  . aspirin 81 MG tablet Take 1 tablet (81 mg total) by mouth daily.  30 tablet  0  . Fluticasone-Salmeterol (ADVAIR DISKUS) 100-50 MCG/DOSE AEPB Inhale 1 puff into the lungs 2 (two) times daily.      . furosemide (LASIX) 40 MG tablet Take 40 mg by mouth daily.      [name] glucose blood (ONE TOUCH ULTRA TEST) test strip Inject into the skin.      . metFORMIN (GLUCOPHAGE) 500 MG tablet Take 500 mg by mouth 2 (two) times daily with a meal.      . metoprolol succinate (TOPROL XL) 25 MG 24 hr tablet Take 1 tablet (25 mg total) by mouth daily.  90 tablet  3  . nystatin (MYCOSTATIN) powder Apply topically.      . pimecrolimus (ELIDEL) 1 % cream Apply topically.      . probenecid (BENEMID) 500 MG tablet Take 500 mg by mouth 2 (two) times daily.       . simvastatin (ZOCOR) 40 MG tablet Take 40 mg by mouth daily.      . vitamin B-12 (CYANOCOBALAMIN) 1000 MCG tablet Take 1,000 mcg by mouth daily.       No current facility-administered medications for this visit.    Allergies:    Allergies  Allergen Reactions  . Indomethacin Other (See Comments)    dizziness    Social History:  The patient  reports that he has never smoked. He has never used smokeless tobacco. He reports that he does not drink alcohol or use illicit drugs.   ROS:  Please see the history of present illness.   Denies any syncope, stroke like  symptoms, orthopnea, PND, chest pain. Occasionally he will feel some slight dizziness.  PHYSICAL EXAM: VS:  BP 138/78  Pulse 71  Ht 5\' 10"  (1.778 m)  Wt 266 lb (120.657 kg)  BMI 38.17 kg/m2 Well nourished, well developed, in no acute distress HEENT: normal Neck: no JVD Cardiac:  normal S1, S2; RRR; no murmur NO ectopy Lungs:  clear to auscultation bilaterally, no wheezing, rhonchi or rales Abd: soft, nontender, no hepatomegalyObese Ext: no edema Skin: warm and dry Neuro: no focal abnormalities noted  EKG:  04/22/13 from Dr. Ammie Ferrier office:  ECG showed PVC's frequent. Personally viewed EKG which shows sinus rhythm with ventricular trigeminy. Upright in aVF, upright in V3 through V6, down in aVR. 12/25/13-sinus bradycardia underlying rate 50 with 3 PVCs noted on 12-lead-frequent. PVC isoelectric in lead 1, positive lead to, 3, aVF, negative in aVL, negative aVR, positive in V4 through 6.  Holter monitor 05/07/13-18,000 PVCs Echocardiogram pending   ASSESSMENT AND PLAN:  1. Frequent PVCs-unifocal. No syncope. At last visit, seemed like it improved on metoprolol. None were heard on auscultation. His EKG today however demonstrates 3 PVCs. I would like to check an echocardiogram to ensure that he is having proper structure and function given his abnormal EKG. He states that in the summertime, he has "asthma" and some difficulty with shortness of breath when walking in a hot environment. He has been walking otherwise at the [facility] several miles a day. Doing very well. Continue on low-dose metoprolol 25 mg extended release.  Mild dizziness previously may have been associated with lower effective heart rate. 24-hour Holter monitor-18,000 PVCs. Very frequent burden. I explained to him that suppression is important to help prevent cardiomyopathy. Previous echocardiogram years ago 2009 performed showed normal ejection fraction without any significant left ventricular hypertrophy. Prior nuclear stress test  was also low risk. TSH was normal. Repeat echocardiogram. 2. Obesity-encourage weight loss. Fit bit. Discussed decreasing carbohydrates. Walks [employer]. 3. Hypertension-currently reasonably controlled. Recommended Omron blood pressure cuff. 4. Hyperlipidemia-simvastatin. Continue. 5. We will see him back in 6 months.  Signed, [name], MD [facility]  12/25/2013 10:18 AM

## 2013-12-27 ENCOUNTER — Ambulatory Visit (HOSPITAL_COMMUNITY): Payer: Medicare Other | Attending: Family Medicine | Admitting: Cardiology

## 2013-12-27 DIAGNOSIS — I4949 Other premature depolarization: Secondary | ICD-10-CM | POA: Insufficient documentation

## 2013-12-27 DIAGNOSIS — R9389 Abnormal findings on diagnostic imaging of other specified body structures: Secondary | ICD-10-CM | POA: Insufficient documentation

## 2013-12-27 DIAGNOSIS — E785 Hyperlipidemia, unspecified: Secondary | ICD-10-CM | POA: Insufficient documentation

## 2013-12-27 DIAGNOSIS — R9431 Abnormal electrocardiogram [ECG] [EKG]: Secondary | ICD-10-CM | POA: Insufficient documentation

## 2013-12-27 DIAGNOSIS — I493 Ventricular premature depolarization: Secondary | ICD-10-CM

## 2013-12-27 DIAGNOSIS — E119 Type 2 diabetes mellitus without complications: Secondary | ICD-10-CM | POA: Insufficient documentation

## 2013-12-27 DIAGNOSIS — E669 Obesity, unspecified: Secondary | ICD-10-CM | POA: Insufficient documentation

## 2013-12-27 DIAGNOSIS — I10 Essential (primary) hypertension: Secondary | ICD-10-CM | POA: Diagnosis not present

## 2013-12-27 NOTE — Progress Notes (Signed)
Echo performed. 

## 2013-12-30 ENCOUNTER — Telehealth: Payer: Self-pay | Admitting: Cardiology

## 2013-12-30 NOTE — Telephone Encounter (Signed)
New message ° ° ° ° ° °Want echo results °

## 2013-12-30 NOTE — Telephone Encounter (Signed)
Reviewed results of echo with pt who states understanding.

## 2014-04-30 ENCOUNTER — Other Ambulatory Visit: Payer: Self-pay | Admitting: Cardiology

## 2014-06-25 ENCOUNTER — Encounter: Payer: Self-pay | Admitting: Cardiology

## 2014-06-25 ENCOUNTER — Ambulatory Visit (INDEPENDENT_AMBULATORY_CARE_PROVIDER_SITE_OTHER): Payer: Medicare Other | Admitting: Cardiology

## 2014-06-25 VITALS — BP 140/70 | HR 70 | Ht 70.0 in | Wt 270.4 lb

## 2014-06-25 DIAGNOSIS — E785 Hyperlipidemia, unspecified: Secondary | ICD-10-CM | POA: Insufficient documentation

## 2014-06-25 DIAGNOSIS — I10 Essential (primary) hypertension: Secondary | ICD-10-CM

## 2014-06-25 DIAGNOSIS — I493 Ventricular premature depolarization: Secondary | ICD-10-CM

## 2014-06-25 DIAGNOSIS — E669 Obesity, unspecified: Secondary | ICD-10-CM

## 2014-06-25 NOTE — Progress Notes (Signed)
[place]. [address], [address] [place], [place]  [address] Phone: [phone] Fax:  [phone]  Date:  06/25/2014   ID:  Mark Singh, DOB 02-26-1946, MRN [id number]  PCP:  [name], MD   History of Present Illness: Mark Singh is a 69 y.o. male with nephrotic syndrome a complete responder to steroid therapy by Dr. Lorrene Reid, diabetes, hypertension who is here for a one-year followup. His prior echocardiogram showed a normal EF with diastolic dysfunction and dilated left atrium. He no longer complains of any excessive dyspnea or chest burning/tightness. Prior nuclear stress test was low risk.He still continues to battle with obesity and is eager to lose weight. He continues to take his cholesterol medication. He is normotensive with blood pressures running in the 120s to 130s at all doctors visits.  In 2014 had skin cancer surgery. Tired all of the time at that point. HR at that point was in the 40's, during colonoscopy. ECG showed PVC's frequent. Personally viewed EKG which shows sinus rhythm with ventricular trigeminy. Upright in aVF, upright in V3 through V6, down in aVR.  Frequent PVCs 18000 were noted on Holter monitor. We started metoprolol extended release 25 mg once a day on 05/13/13.  Once again similar PVCs were seen on EKG. We discussed at length. He is walking quite well inside. He does have an asthma-like sensation when he walks in the summer heat.  06/25/14-overall he's been doing well that he has gained some weight. No significant shortness of breath. No chest pain. He does not feel his PVCs. Echocardiogram was reassuring. He does have mild pulmonary hypertension which is secondary from obesity. Prior thyroid evaluation was unremarkable. No evidence of hyperthyroidism.  Wt Readings from Last 3 Encounters:  06/25/14 270 lb 6.4 oz (122.653 kg)  12/25/13 266 lb (120.657 kg)  06/28/13 260 lb (117.935 kg)     Past Medical History  Diagnosis Date    . Penile neoplasm   . Hyperlipidemia   . Type 2 diabetes mellitus   . H/O: gout     right foot  . Arthritis   . Seasonal asthma     mild  . Asthma   . HTN (hypertension)   . Orthostatic hypotension   . Myalgia and myositis, unspecified   . Edema     , ECHO 04/18/08 - Nl EF 65%, mild to moderate LAE, mild diastolic dysfunction  . Nephrotic syndrome   . Back pain   . Shoulder pain     Past Surgical History  Procedure Laterality Date  . Umbilical hernia repair  11-04-2005  . Umbilical hernia repair  11-04-2005  . Left foot surgery  1992  &  2008    REMOVAL HEAL SPUR/ BUNIONECTOMY AND HAMMERTOE REPAIR X2  . Circumcision N/A 11/27/2012    Procedure:  CIRCUMCISION  AND EXCISION OF GLANS PENIS;  Surgeon: Fredricka Bonine, MD;  Location: [facility];  Service: Urology;  Laterality: N/A;  . Hammer toe surgery      Current Outpatient Prescriptions  Medication Sig Dispense Refill  . aspirin 81 MG tablet Take 1 tablet (81 mg total) by mouth daily. 30 tablet 0  . Fluticasone-Salmeterol (ADVAIR DISKUS) 100-50 MCG/DOSE AEPB Inhale 1 puff into the lungs 2 (two) times daily.    . furosemide (LASIX) 40 MG tablet Take 40 mg by mouth daily.    [name] glucose blood (ONE TOUCH ULTRA TEST) test strip Inject into the skin.    [name]  metFORMIN (GLUCOPHAGE) 500 MG tablet Take 500 mg by mouth 2 (two) times daily with a meal.    . metoprolol succinate (TOPROL-XL) 25 MG 24 hr tablet TAKE 1 TABLET (25 MG TOTAL) BY MOUTH DAILY. 90 tablet 1  . nystatin (MYCOSTATIN) powder Apply topically.    . pimecrolimus (ELIDEL) 1 % cream Apply topically.    . probenecid (BENEMID) 500 MG tablet Take 500 mg by mouth 2 (two) times daily.     . simvastatin (ZOCOR) 40 MG tablet Take 40 mg by mouth daily.    . vitamin B-12 (CYANOCOBALAMIN) 1000 MCG tablet Take 1,000 mcg by mouth daily.     No current facility-administered medications for this visit.    Allergies:    Allergies  Allergen Reactions  .  Indomethacin Other (See Comments)    dizziness    Social History:  The patient  reports that he has never smoked. He has never used smokeless tobacco. He reports that he does not drink alcohol or use illicit drugs.   ROS:  Please see the history of present illness.   Denies any syncope, stroke like symptoms, orthopnea, PND, chest pain. Occasionally he will feel some slight dizziness.  PHYSICAL EXAM: VS:  BP 140/70 mmHg  Pulse 70  Ht 5\' 10"  (1.778 m)  Wt 270 lb 6.4 oz (122.653 kg)  BMI 38.80 kg/m2  SpO2 91% Well nourished, well developed, in no acute distress HEENT: normal Neck: no JVD Cardiac:  normal S1, S2; RRR; no murmur NO ectopy Lungs:  clear to auscultation bilaterally, no wheezing, rhonchi or rales Abd: soft, nontender, no hepatomegalyObese Ext: no edema Skin: warm and dry Neuro: no focal abnormalities noted  EKG:  04/22/13 from Dr. Ammie Ferrier office:  ECG showed PVC's frequent. Personally viewed EKG which shows sinus rhythm with ventricular trigeminy. Upright in aVF, upright in V3 through V6, down in aVR. 12/25/13-sinus bradycardia underlying rate 50 with 3 PVCs noted on 12-lead-frequent. PVC isoelectric in lead 1, positive lead to, 3, aVF, negative in aVL, negative aVR, positive in V4 through 6.  Holter monitor 05/07/13-18,000 PVCs Echocardiogram 12/27/13-normal ejection fraction, mild LVH, pulmonary artery pressure estimated 38 mmHg. Labs: 05/07/13-TSH was normal. Electrolytes normal.   ASSESSMENT AND PLAN:  1. Frequent PVCs-unifocal. No syncope. Echocardiogram reassuring with normal pump function. At prior visit, seemed like it improved on metoprolol. None were heard on auscultation. Last visit EKG however demonstrates 3 PVCs. He states that in the summertime, he has "asthma" and some difficulty with shortness of breath when walking in a hot environment. He is no longer going to the [facility], trouble parking. 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 He is  motivated to try to lose weight. Continue on low-dose metoprolol 25 mg extended release.  Mild dizziness previously may have been associated with lower effective heart rate. 24-hour Holter monitor-18,000 PVCs. Very frequent burden. Previous echocardiogram years ago 2009 performed showed normal ejection fraction without any significant left ventricular hypertrophy. Prior nuclear stress test was also low risk. TSH was normal. Most recent echocardiogram shows normal ejection fraction as well. 2. Obesity-encourage weight loss. Fit bit. Discussed decreasing carbohydrates. 3. Hypertension-currently reasonably controlled but on upper limit of normal.. Has Omron blood pressure cuff. 4. Hyperlipidemia-simvastatin. Continue. 5. We will see him back in 12 months.  Signed, [name], MD [facility]  06/25/2014 2:18 PM

## 2014-06-25 NOTE — Patient Instructions (Signed)
**Note De-identified Samanda Buske Obfuscation** Your physician recommends that you continue on your current medications as directed. Please refer to the Current Medication list given to you today.  Your physician wants you to follow-up in: 1 year. You will receive a reminder letter in the mail two months in advance. If you don't receive a letter, please call our office to schedule the follow-up appointment.  

## 2014-10-22 ENCOUNTER — Other Ambulatory Visit: Payer: Self-pay | Admitting: Cardiology

## 2015-02-02 ENCOUNTER — Encounter: Payer: Self-pay | Admitting: *Deleted

## 2015-02-02 NOTE — Progress Notes (Signed)
Cardiology Office Note Date:  02/03/2015  Patient ID:  Mark Singh, DOB May 22, 1945, MRN [id number] PCP:  Mark Solo, MD  Cardiologist:  Dr. Marlou Singh   Chief Complaint: evaluate PVCs and possible bradycardia  History of Present Illness: Mark Singh is a 69 y.o. male with history of PVCs, bradycardia, nephrotic syndrome (complete responder to steroid therapy by Dr. Lorrene Singh), DM, HTN, HLD, obesity, orthostatic hypotension, mild pulm HTN felt 2/2 obesity who presents for evaluation of bradycardia. Per Dr. Marlou Singh' note, he has prior nuclear stress test that was low risk. In 2014 he had skin cancer surgery and was tired all the time at that point. HR at that point was in the 40's, during colonoscopy and ECG showed frequent PVCs/ventricular trigeminy. He wore a Holter 05/2013 showing 18k PVCs (18% of the time), min HR 47bpm, mean rate 68bpm. He was started on metoprolol ER. Labs 05/2013 showed normal TSH, free T4 0.57, K 3.9, Cr 1.4. 2D echo 11/2013: EF 0000000, LV diastolic function parameters were normal, severely dilated LA, PASP 70mmHg.  He saw his PCP recently for his physical on 01/20/15. He was reporting dizziness upon standing. HR was 52. EKG reportedly showed 1st degree AVB, bigeminal PVCs. His PCP stopped metoprolol. Mark Singh went back for follow-up 02/02/15 at which time he continued to report unchanged dizziness upon standing. He was following his vitals at home with an auto-BP-cuff. He had 2 recorded HRs of 34 (unclear if this was pseudobradycardia due to monitor not picking up PVCs versus true bradycardia). Repeat EKG reportedly showed trigeminy, PVCs, HR 61. Manual pulse check by PCP was 40 (again, question pseudobradycardia). Labs 01/20/15: A1C 6.2, WBC 6.9, Hgb 15.3, plt 282. No recent BMET or thyroid function available.  He comes in today for evaluation of the above. He only reports dizziness upon postural changes. He denies any dizziness occurring any other time.  EKG shows ventricular trigeminy. He is not particularly aware of the PVCs themselves. No syncope, chest pain, dyspnea. He exercises regularly without functional limitation. We ambulated him in clinic today and HR got up to the 80s.    Past Medical History  Diagnosis Date  . Penile neoplasm   . Hyperlipidemia   . Type 2 diabetes mellitus (Mark Singh)   . H/O: gout     right foot  . Arthritis   . Seasonal asthma     mild  . Asthma   . HTN (hypertension)   . Orthostatic hypotension   . Myalgia and myositis, unspecified   . Edema     a. 2D echo 11/2013: EF 0000000, LV diastolic function parameters were normal, severely dilated LA, PASP 79mmHg..  . Nephrotic syndrome     a. responder to steroid therapy by Dr. Lorrene Singh.   . Back pain   . Shoulder pain   . PVC's (premature ventricular contractions)     a. Holter 05/2013 showing 18k PVCs (18% of the time).  . Bradycardia     a. Repoted h/o HR in the 40s.  . Obesity   . Pulmonary hypertension ([place])     a. Mild pulm HTN felt 2/2 obesity.    Past Surgical History  Procedure Laterality Date  . Umbilical hernia repair  11-04-2005  . Left foot surgery  1992  &  2008    REMOVAL HEAL SPUR/ BUNIONECTOMY AND HAMMERTOE REPAIR X2  . Circumcision N/A 11/27/2012    Procedure:  CIRCUMCISION  AND EXCISION OF GLANS PENIS;  Surgeon: Fredricka Bonine, MD;  Location:  [facility];  Service: Urology;  Laterality: N/A;  . Hammer toe surgery      Current Outpatient Prescriptions  Medication Sig Dispense Refill  . aspirin 81 MG tablet Take 1 tablet (81 mg total) by mouth daily. 30 tablet 0  . Fluticasone-Salmeterol (ADVAIR DISKUS) 100-50 MCG/DOSE AEPB Inhale 1 puff into the lungs 2 (two) times daily as needed (short of breath).     . furosemide (LASIX) 40 MG tablet Take 40 mg by mouth daily.    [name] glucose blood (ONE TOUCH ULTRA TEST) test strip Inject into the skin as directed.     . metFORMIN (GLUCOPHAGE) 500 MG tablet Take 500 mg by mouth 2  (two) times daily with a meal.    . nystatin (MYCOSTATIN) powder Apply topically.    . pimecrolimus (ELIDEL) 1 % cream Apply topically as directed.     . probenecid (BENEMID) 500 MG tablet Take 500 mg by mouth 2 (two) times daily.     . simvastatin (ZOCOR) 40 MG tablet Take 40 mg by mouth daily.    . vitamin B-12 (CYANOCOBALAMIN) 1000 MCG tablet Take 1,000 mcg by mouth daily.    [name] ZOSTAVAX 16109 UNT/0.65ML injection      No current facility-administered medications for this visit.    Allergies:   Indomethacin   Social History:  The patient  reports that he has never smoked. He has never used smokeless tobacco. He reports that he does not drink alcohol or use illicit drugs.   Family History:  The patient's family history includes CVA in his father; Coronary artery disease in his mother.  ROS:  Please see the history of present illness.  All other systems are reviewed and otherwise negative.   PHYSICAL EXAM:  VS:  BP 142/60 mmHg  Pulse 60  Ht 5\' 9"  (1.753 m)  Wt 245 lb (111.131 kg)  BMI 36.16 kg/m2 BMI: Body mass index is 36.16 kg/(m^2). Well nourished, well developed WM, in no acute distress HEENT: normocephalic, atraumatic Neck: no JVD, carotid bruits or masses Cardiac:  normal S1, S2; RRR with regular ectopy; no murmurs, rubs, or gallops Lungs:  clear to auscultation bilaterally, no wheezing, rhonchi or rales Abd: soft, nontender, no hepatomegaly, + BS MS: no deformity or atrophy Ext: no edema Skin: warm and dry, no rash Neuro:  moves all extremities spontaneously, no focal abnormalities noted, follows commands Psych: euthymic mood, full affect   EKG:  Done today shows NSR 60bpm with frequent PVCs in a trigeminal pattern, QRS 53ms, QTc 441ms  Recent Labs: No results found for requested labs within last 365 days.  No results found for requested labs within last 365 days.   CrCl cannot be calculated (Patient has no serum creatinine result on file.).   Wt Readings from  Last 3 Encounters:  02/03/15 245 lb (111.131 kg)  06/25/14 270 lb 6.4 oz (122.653 kg)  12/25/13 266 lb (120.657 kg)     Other studies reviewed: Additional studies/records reviewed today include: summarized above  ASSESSMENT AND PLAN:  1. PVCs - patient has history of high PVC burden. He was recently seen by PCP for dizziness that sounds orthostatic in nature, but there was also reported slow HR as well. It is difficult to know if this was true bradycardia versus an error in pulse check due to his frequent ectopy. We often see pseudobradycardia in patients with frequent PVCs where their ectopy is not accurately represented in the pulse count. Will check BMET, Mg, thyroid function.  Will plan on a repeat 48-hour Holter - the slow HR counts are happening daily so we should be able to discern within this timeframe if they are real. If he continues to have significant PVC burden, he would benefit from referral to electrophysiology for suppression given potential to contribute to future cardiomyopathy. Might consider alternative agent such as acebutolol for specific ventricular premature beat suppression, as long as no significant bradycardia is seen on Holter. If this is added, he may need to reduce dose of Lasix due to his h/o orthostasis. 2. Essential HTN with probable component of orthostatic hypotension - his dizziness sounds predominantly due to orthostasis. He is on Lasix chronically which he reports due to prior kidney issues. I am hesitant to change this as I am not sure if it's related specifically to his history of nephrotic disease. If event monitor is unrevealing for arrhythmia upon standing, he should f/u with his PCP to discuss switching Lasix to see if it helps. Compression stockings recommended. 3. Hyperlipidemia - followed by PCP. 4. Obesity - patient has lost weight compared to last f/u visit. He continues to exercise regularly.  Disposition: F/u with EP as above.  Current medicines are  reviewed at length with the patient today.  The patient did not have any concerns regarding medicines.  Raechel Ache PA-C 02/03/2015 10:52 AM     [facility] [place] [address] [phone] (office)  [phone] (fax)

## 2015-02-03 ENCOUNTER — Encounter: Payer: Self-pay | Admitting: Physician Assistant

## 2015-02-03 ENCOUNTER — Ambulatory Visit (INDEPENDENT_AMBULATORY_CARE_PROVIDER_SITE_OTHER): Payer: Medicare Other | Admitting: Physician Assistant

## 2015-02-03 VITALS — BP 142/60 | HR 60 | Ht 69.0 in | Wt 245.0 lb

## 2015-02-03 DIAGNOSIS — R001 Bradycardia, unspecified: Secondary | ICD-10-CM | POA: Diagnosis not present

## 2015-02-03 DIAGNOSIS — I493 Ventricular premature depolarization: Secondary | ICD-10-CM

## 2015-02-03 DIAGNOSIS — E669 Obesity, unspecified: Secondary | ICD-10-CM

## 2015-02-03 DIAGNOSIS — I10 Essential (primary) hypertension: Secondary | ICD-10-CM | POA: Diagnosis not present

## 2015-02-03 DIAGNOSIS — E785 Hyperlipidemia, unspecified: Secondary | ICD-10-CM

## 2015-02-03 NOTE — Patient Instructions (Addendum)
Medication Instructions:   Your physician recommends that you continue on your current medications as directed. Please refer to the Current Medication list given to you today.    Labwork: BMET TSH MAG AND  T FREE 4    Testing/Procedures:  Your physician has recommended that you wear a holter monitor. Holter monitors are medical devices that record the heart's electrical activity. Doctors most often use these monitors to diagnose arrhythmias. Arrhythmias are problems with the speed or rhythm of the heartbeat. The monitor is a small, portable device. You can wear one while you do your normal daily activities. This is usually used to diagnose what is causing palpitations/syncope (passing out).   Follow-Up:  WITH  EP DR AS NEW EP PATIENT  ([facility]?)   Any Other Special Instructions Will Be Listed Below (If Applicable).

## 2015-02-04 ENCOUNTER — Ambulatory Visit (INDEPENDENT_AMBULATORY_CARE_PROVIDER_SITE_OTHER): Payer: Medicare Other

## 2015-02-04 DIAGNOSIS — I10 Essential (primary) hypertension: Secondary | ICD-10-CM | POA: Diagnosis not present

## 2015-02-04 DIAGNOSIS — I493 Ventricular premature depolarization: Secondary | ICD-10-CM | POA: Diagnosis not present

## 2015-02-04 DIAGNOSIS — E785 Hyperlipidemia, unspecified: Secondary | ICD-10-CM

## 2015-02-04 DIAGNOSIS — R001 Bradycardia, unspecified: Secondary | ICD-10-CM

## 2015-02-04 DIAGNOSIS — E669 Obesity, unspecified: Secondary | ICD-10-CM

## 2015-02-04 LAB — BASIC METABOLIC PANEL
BUN: 15 mg/dL (ref 7–25)
CALCIUM: 9.8 mg/dL (ref 8.6–10.3)
CO2: 27 mmol/L (ref 20–31)
Chloride: 102 mmol/L (ref 98–110)
Creat: 1.35 mg/dL — ABNORMAL HIGH (ref 0.70–1.25)
GLUCOSE: 103 mg/dL — AB (ref 65–99)
Potassium: 4.4 mmol/L (ref 3.5–5.3)
Sodium: 140 mmol/L (ref 135–146)

## 2015-02-04 LAB — T4, FREE: Free T4: 0.68 ng/dL — ABNORMAL LOW (ref 0.80–1.80)

## 2015-02-04 LAB — TSH: TSH: 3.091 u[IU]/mL (ref 0.350–4.500)

## 2015-02-04 LAB — MAGNESIUM: Magnesium: 1.9 mg/dL (ref 1.5–2.5)

## 2015-02-26 NOTE — Progress Notes (Signed)
Electrophysiology Office Note   Date:  02/27/2015   ID:  Mark Singh, DOB Feb 05, 1946, MRN [id number]  PCP:  [name], MD  Cardiologist:  [name] Primary Electrophysiologist: Lynnix Schoneman Meredith Leeds, MD    Chief Complaint  Patient presents with  . PVC  . Hypertension     History of Present Illness: Mark Singh is a 69 y.o. male who presents today for electrophysiology evaluation.   He has a history PVCs, bradycardia, nephrotic syndrome (complete responder to steroid therapy by Dr. Lorrene Reid), DM, HTN, HLD, obesity, orthostatic hypotension, mild pulm HTN felt 2/2 obesity who presents for evaluation of bradycardia.  In 2014 he had skin cancer surgery and was tired all the time at that point. HR at that point was in the 40's, during colonoscopy and ECG showed frequent PVCs/ventricular trigeminy. He wore a Holter 05/2013 showing 18k PVCs (18% of the time), min HR 47bpm, mean rate 68bpm. He was started on metoprolol ER. Labs 05/2013 showed normal TSH, free T4 0.57, K 3.9, Cr 1.4. 2D echo 11/2013: EF 0000000, LV diastolic function parameters were normal, severely dilated LA, PASP 30mmHg.  He saw his PCP in September and was noted to have dizziness on standing.  At that time his HR was 52 with bigeminy.  He continues to have dizziness on standing.  He has had HR at home of 34, possibly due to increased PVCs.  Today, he denies symptoms of palpitations, chest pain, orthopnea, PND, lower extremity edema, claudication, dizziness, presyncope, syncope, bleeding, or neurologic sequela.  He is having increasing fatigue, shortness of breath and orthostatic symptoms.  He states that this has been worsening over the last few months.  He used to exercise each morning, but now just exercises twice per week.   Past Medical History  Diagnosis Date  . Penile neoplasm   . Hyperlipidemia   . Type 2 diabetes mellitus ([place])   . H/O: gout     right foot  . Arthritis   . Seasonal asthma       mild  . Asthma   . HTN (hypertension)   . Orthostatic hypotension   . Myalgia and myositis, unspecified   . Edema     a. 2D echo 11/2013: EF 0000000, LV diastolic function parameters were normal, severely dilated LA, PASP 47mmHg..  . Nephrotic syndrome     a. responder to steroid therapy by Dr. Lorrene Reid.   . Back pain   . Shoulder pain   . PVC's (premature ventricular contractions)     a. Holter 05/2013 showing 18k PVCs (18% of the time).  . Bradycardia     a. Repoted h/o HR in the 40s.  . Obesity   . Pulmonary hypertension ([place])     a. Mild pulm HTN felt 2/2 obesity.   Past Surgical History  Procedure Laterality Date  . Umbilical hernia repair  11-04-2005  . Left foot surgery  1992  &  2008    REMOVAL HEAL SPUR/ BUNIONECTOMY AND HAMMERTOE REPAIR X2  . Circumcision N/A 11/27/2012    Procedure:  CIRCUMCISION  AND EXCISION OF GLANS PENIS;  Surgeon: Fredricka Bonine, MD;  Location: [facility];  Service: Urology;  Laterality: N/A;  . Hammer toe surgery       Current Outpatient Prescriptions  Medication Sig Dispense Refill  . aspirin 81 MG tablet Take 1 tablet (81 mg total) by mouth daily. 30 tablet 0  . Fluticasone-Salmeterol (ADVAIR DISKUS) 100-50 MCG/DOSE AEPB Inhale 1 puff  into the lungs 2 (two) times daily as needed (short of breath).     [name] glucose blood (ONE TOUCH ULTRA TEST) test strip Inject into the skin as directed.     . metFORMIN (GLUCOPHAGE) 500 MG tablet Take 500 mg by mouth 2 (two) times daily with a meal.    . nystatin (MYCOSTATIN) powder Apply topically as directed.     . pimecrolimus (ELIDEL) 1 % cream Apply topically as directed.     . probenecid (BENEMID) 500 MG tablet Take 500 mg by mouth 2 (two) times daily.     . simvastatin (ZOCOR) 40 MG tablet Take 40 mg by mouth daily.    . vitamin B-12 (CYANOCOBALAMIN) 1000 MCG tablet Take 1,000 mcg by mouth daily.    [name] ZOSTAVAX 03474 UNT/0.65ML injection as directed.      No current  facility-administered medications for this visit.    Allergies:   Indomethacin   Social History:  The patient  reports that he has never smoked. He has never used smokeless tobacco. He reports that he does not drink alcohol or use illicit drugs.   Family History:  The patient's family history includes CVA in his father; Coronary artery disease in his mother.    ROS:  Please see the history of present illness.   Otherwise, review of systems is positive for palpitations.   All other systems are reviewed and negative.    PHYSICAL EXAM: VS:  BP 140/70 mmHg  Pulse 87  Ht 5\' 9"  (1.753 m)  Wt 250 lb 9.6 oz (113.671 kg)  BMI 36.99 kg/m2 , BMI Body mass index is 36.99 kg/(m^2). GEN: Well nourished, well developed, in no acute distress HEENT: normal Neck: no JVD, carotid bruits, or masses Cardiac: irregular rhythm; no murmurs, rubs, or gallops,no edema  Respiratory:  clear to auscultation bilaterally, normal work of breathing GI: soft, nontender, nondistended, + BS MS: no deformity or atrophy Skin: warm and dry Neuro:  Strength and sensation are intact Psych: euthymic mood, full affect  EKG:  EKG is ordered today. The ekg ordered today shows sinus rhythm, trigeminy with occasional junctional beats  Recent Labs: 02/03/2015: BUN 15; Creat 1.35*; Magnesium 1.9; Potassium 4.4; Sodium 140; TSH 3.091    Lipid Panel  No results found for: CHOL, TRIG, HDL, CHOLHDL, VLDL, LDLCALC, LDLDIRECT   Wt Readings from Last 3 Encounters:  02/27/15 250 lb 9.6 oz (113.671 kg)  02/03/15 245 lb (111.131 kg)  06/25/14 270 lb 6.4 oz (122.653 kg)      Other studies Reviewed: Additional studies/ records that were reviewed today include: TTE 2015 Review of the above records today demonstrates:  - Left ventricle: The cavity size was normal. There was mild concentric hypertrophy. Systolic function was normal. The estimated ejection fraction was in the range of 55% to 60%. Although no diagnostic  regional wall motion abnormality was identified, this possibility cannot be completely excluded on the basis of this study. Left ventricular diastolic function parameters were normal. - Left atrium: The atrium was severely dilated. - Pulmonary arteries: Systolic pressure was mildly increased. PA peak pressure: 38 mm Hg (S).  Holter   53000 PVC's   Rare ventricular triplet   Ventricular bigeminy 990   Longest pause 2.2 seconds   Mean heart rate 78bpm, lowest 39bpm at 4:18am.  ASSESSMENT AND PLAN:  1.  PVCs: high burden with low HR.  This could be due to his PVCs and a lack of perfusion due to them.  This could also  be the cause of his dizziness on standing.  His PVCs appear to be coming from the anterior base of the heart, likely more posterior.  Discussed medical management with antiarrhythmics vs ablation.  Discussed the advantages of medical therapy with amiodarone as well as ablation.  Patient has agreed to ablation.  Shelvie Salsberry use CARTO mapping.  Explained the risks and benefits of the procedure including bleeding and tamponade.   Current medicines are reviewed at length with the patient today.   The patient does not have concerns regarding his medicines.  The following changes were made today:  none  Labs/ tests ordered today include: PVC ablation  No orders of the defined types were placed in this encounter.     Disposition:   FU with post ablation  Signed, Augusta Hilbert Meredith Leeds, MD  02/27/2015 9:22 AM     [facility] [address] [place] [address] [phone] (office) [phone] (fax)

## 2015-02-27 ENCOUNTER — Encounter: Payer: Self-pay | Admitting: Cardiology

## 2015-02-27 ENCOUNTER — Ambulatory Visit (INDEPENDENT_AMBULATORY_CARE_PROVIDER_SITE_OTHER): Payer: Medicare Other | Admitting: Cardiology

## 2015-02-27 ENCOUNTER — Encounter: Payer: Self-pay | Admitting: *Deleted

## 2015-02-27 VITALS — BP 140/70 | HR 87 | Ht 69.0 in | Wt 250.6 lb

## 2015-02-27 DIAGNOSIS — Z01812 Encounter for preprocedural laboratory examination: Secondary | ICD-10-CM | POA: Diagnosis not present

## 2015-02-27 DIAGNOSIS — I493 Ventricular premature depolarization: Secondary | ICD-10-CM | POA: Diagnosis not present

## 2015-02-27 NOTE — Patient Instructions (Signed)
Medication Instructions:  Your physician recommends that you continue on your current medications as directed. Please refer to the Current Medication list given to you today.  Labwork: Your physician recommends that you return for pre procedure lab work on 03/23/15.  Testing/Procedures: Your physician has recommended that you have an PVC ablation. Catheter ablation is a medical procedure used to treat some cardiac arrhythmias (irregular heartbeats). During catheter ablation, a long, thin, flexible tube is put into a blood vessel in your groin (upper thigh), or neck. This tube is called an ablation catheter. It is then guided to your heart through the blood vessel. Radio frequency waves destroy small areas of heart tissue where abnormal heartbeats may cause an arrhythmia to start. Please see the instruction sheet given to you today.  Follow-Up: Your physician recommends that you schedule a follow-up appointment in: 3 months with [name], after your procedure on 03/30/15.   Any Other Special Instructions Will Be Listed Below (If Applicable).  If you need a refill on your cardiac medications before your next appointment, please call your pharmacy.  Thank you for choosing [facility]!!   [name], RN [phone]    Cardiac Ablation Cardiac ablation is a procedure to disable a small amount of heart tissue in very specific places. The heart has many electrical connections. Sometimes these connections are abnormal and can cause the heart to beat very fast or irregularly. By disabling some of the problem areas, heart rhythm can be improved or made normal. Ablation is done for people who:   Have Wolff-Parkinson-White syndrome.   Have other fast heart rhythms (tachycardia).   Have taken medicines for an abnormal heart rhythm (arrhythmia) that resulted in:   No success.   Side effects.   May have a high-risk heartbeat that could result in death.  LET [facility] CARE  PROVIDER KNOW ABOUT:   Any allergies you have or any previous reactions you have had to X-ray dye, food (such as seafood), medicine, or tape.   All medicines you are taking, including vitamins, herbs, eye drops, creams, and over-the-counter medicines.   Previous problems you or members of your family have had with the use of anesthetics.   Any blood disorders you have.   Previous surgeries or procedures (such as a kidney transplant) you have had.   Medical conditions you have (such as kidney failure).  RISKS AND COMPLICATIONS Generally, cardiac ablation is a safe procedure. However, problems can occur and include:   Increased risk of cancer. Depending on how long it takes to do the ablation, the dose of radiation can be high.  Bruising and bleeding where a thin, flexible tube (catheter) was inserted during the procedure.   Bleeding into the chest, especially into the sac that surrounds the heart (serious).  Need for a permanent pacemaker if the normal electrical system is damaged.   The procedure may not be fully effective, and this may not be recognized for months. Repeat ablation procedures are sometimes required. BEFORE THE PROCEDURE   Follow any instructions from your health care provider regarding eating and drinking before the procedure.   Take your medicines as directed at regular times with water, unless instructed otherwise by your health care provider. If you are taking diabetes medicine, including insulin, ask how you are to take it and if there are any special instructions you should follow. It is common to adjust insulin dosing the day of the ablation.  PROCEDURE  An ablation is usually performed in a  catheterization laboratory with the guidance of fluoroscopy. Fluoroscopy is a type of X-ray that helps your health care provider see images of your heart during the procedure.   An ablation is a minimally invasive procedure. This means a small cut (incision) is  made in either your neck or groin. Your health care provider will decide where to make the incision based on your medical history and physical exam.  An IV tube will be started before the procedure begins. You will be given an anesthetic or medicine to help you relax (sedative).  The skin on your neck or groin will be numbed. A needle will be inserted into a large vein in your neck or groin and catheters will be threaded to your heart.  A special dye that shows up on fluoroscopy pictures may be injected through the catheter. The dye helps your health care provider see the area of the heart that needs treatment.  The catheter has electrodes on the tip. When the area of heart tissue that is causing the arrhythmia is found, the catheter tip will send an electrical current to the area and "scar" the tissue. Three types of energy can be used to ablate the heart tissue:   Heat (radiofrequency energy).   Laser energy.   Extreme cold (cryoablation).   When the area of the heart has been ablated, the catheter will be taken out. Pressure will be held on the insertion site. This will help the insertion site clot and keep it from bleeding. A bandage will be placed on the insertion site.  AFTER THE PROCEDURE   After the procedure, you will be taken to a recovery area where your vital signs (blood pressure, heart rate, and breathing) will be monitored. The insertion site will also be monitored for bleeding.   You will need to lie still for 4-6 hours. This is to ensure you do not bleed from the catheter insertion site.    This information is not intended to replace advice given to you by your health care provider. Make sure you discuss any questions you have with your health care provider.   Document Released: 09/04/2008 Document Revised: 05/09/2014 Document Reviewed: 09/10/2012 Elsevier Interactive Patient Education [employer].

## 2015-02-27 NOTE — Progress Notes (Signed)
Faxed OV note to PCP

## 2015-02-27 NOTE — Addendum Note (Signed)
Addended by: Stanton Kidney on: 02/27/2015 10:10 AM   Modules accepted: Orders

## 2015-03-06 NOTE — Addendum Note (Signed)
Addended by: Freada Bergeron on: 03/06/2015 03:00 PM   Modules accepted: Orders

## 2015-03-13 ENCOUNTER — Telehealth: Payer: Self-pay | Admitting: Cardiology

## 2015-03-13 NOTE — Telephone Encounter (Signed)
New problem    Pt has questions concerning his upcoming surgery on 11.28.16.

## 2015-03-13 NOTE — Telephone Encounter (Signed)
Called patient and advised that I would be fowarding his message to Dr Curt Bears' nurse who will be back on Monday.  Patient in agreement.

## 2015-03-17 NOTE — Telephone Encounter (Signed)
Addressed patient's questions about upcoming procedure. Reviewed restrictions with lifting/exercising post procedure. Patient verbalized understanding and agreeable to proceeding with procedure scheduled for 03/30/15.

## 2015-03-23 ENCOUNTER — Other Ambulatory Visit (INDEPENDENT_AMBULATORY_CARE_PROVIDER_SITE_OTHER): Payer: Medicare Other | Admitting: *Deleted

## 2015-03-23 DIAGNOSIS — I493 Ventricular premature depolarization: Secondary | ICD-10-CM

## 2015-03-23 DIAGNOSIS — Z01812 Encounter for preprocedural laboratory examination: Secondary | ICD-10-CM

## 2015-03-23 LAB — BASIC METABOLIC PANEL
BUN: 14 mg/dL (ref 7–25)
CALCIUM: 9.4 mg/dL (ref 8.6–10.3)
CO2: 29 mmol/L (ref 20–31)
Chloride: 102 mmol/L (ref 98–110)
Creat: 1.26 mg/dL — ABNORMAL HIGH (ref 0.70–1.18)
Glucose, Bld: 84 mg/dL (ref 65–99)
POTASSIUM: 4.6 mmol/L (ref 3.5–5.3)
SODIUM: 139 mmol/L (ref 135–146)

## 2015-03-23 LAB — CBC WITH DIFFERENTIAL/PLATELET
BASOS PCT: 1 % (ref 0–1)
Basophils Absolute: 0.1 10*3/uL (ref 0.0–0.1)
EOS ABS: 0.2 10*3/uL (ref 0.0–0.7)
EOS PCT: 3 % (ref 0–5)
HCT: 46.7 % (ref 39.0–52.0)
Hemoglobin: 15.9 g/dL (ref 13.0–17.0)
Lymphocytes Relative: 24 % (ref 12–46)
Lymphs Abs: 1.6 10*3/uL (ref 0.7–4.0)
MCH: 30.5 pg (ref 26.0–34.0)
MCHC: 34 g/dL (ref 30.0–36.0)
MCV: 89.6 fL (ref 78.0–100.0)
MONO ABS: 0.7 10*3/uL (ref 0.1–1.0)
MPV: 10.9 fL (ref 8.6–12.4)
Monocytes Relative: 11 % (ref 3–12)
NEUTROS ABS: 4 10*3/uL (ref 1.7–7.7)
Neutrophils Relative %: 61 % (ref 43–77)
PLATELETS: 318 10*3/uL (ref 150–400)
RBC: 5.21 MIL/uL (ref 4.22–5.81)
RDW: 13.9 % (ref 11.5–15.5)
WBC: 6.6 10*3/uL (ref 4.0–10.5)

## 2015-03-23 NOTE — Addendum Note (Signed)
Addended by: Eulis Foster on: 03/23/2015 09:38 AM   Modules accepted: Orders

## 2015-03-30 ENCOUNTER — Ambulatory Visit (HOSPITAL_COMMUNITY)
Admission: RE | Admit: 2015-03-30 | Discharge: 2015-03-30 | Disposition: A | Discharge status: Home / Self Care | Payer: Medicare Other | Source: Ambulatory Visit | Attending: Cardiology | Admitting: Cardiology

## 2015-03-30 ENCOUNTER — Encounter (HOSPITAL_COMMUNITY): Payer: Self-pay

## 2015-03-30 ENCOUNTER — Encounter (HOSPITAL_COMMUNITY)
Admission: RE | Disposition: A | Discharge status: Home / Self Care | Payer: Self-pay | Source: Ambulatory Visit | Attending: Cardiology

## 2015-03-30 DIAGNOSIS — I951 Orthostatic hypotension: Secondary | ICD-10-CM | POA: Diagnosis not present

## 2015-03-30 DIAGNOSIS — I472 Ventricular tachycardia: Secondary | ICD-10-CM | POA: Insufficient documentation

## 2015-03-30 DIAGNOSIS — E119 Type 2 diabetes mellitus without complications: Secondary | ICD-10-CM | POA: Insufficient documentation

## 2015-03-30 DIAGNOSIS — E785 Hyperlipidemia, unspecified: Secondary | ICD-10-CM | POA: Insufficient documentation

## 2015-03-30 DIAGNOSIS — J45909 Unspecified asthma, uncomplicated: Secondary | ICD-10-CM | POA: Insufficient documentation

## 2015-03-30 DIAGNOSIS — Z7984 Long term (current) use of oral hypoglycemic drugs: Secondary | ICD-10-CM | POA: Diagnosis not present

## 2015-03-30 DIAGNOSIS — Z79899 Other long term (current) drug therapy: Secondary | ICD-10-CM | POA: Insufficient documentation

## 2015-03-30 DIAGNOSIS — I272 Other secondary pulmonary hypertension: Secondary | ICD-10-CM | POA: Diagnosis not present

## 2015-03-30 DIAGNOSIS — I10 Essential (primary) hypertension: Secondary | ICD-10-CM | POA: Diagnosis not present

## 2015-03-30 DIAGNOSIS — E669 Obesity, unspecified: Secondary | ICD-10-CM | POA: Diagnosis not present

## 2015-03-30 DIAGNOSIS — Z7982 Long term (current) use of aspirin: Secondary | ICD-10-CM | POA: Diagnosis not present

## 2015-03-30 DIAGNOSIS — I493 Ventricular premature depolarization: Secondary | ICD-10-CM | POA: Diagnosis not present

## 2015-03-30 HISTORY — PX: ELECTROPHYSIOLOGIC STUDY: SHX172A

## 2015-03-30 LAB — POCT ACTIVATED CLOTTING TIME
ACTIVATED CLOTTING TIME: 294 s
ACTIVATED CLOTTING TIME: 263 s
ACTIVATED CLOTTING TIME: 146 s

## 2015-03-30 LAB — GLUCOSE, CAPILLARY
GLUCOSE-CAPILLARY: 114 mg/dL — AB (ref 65–99)
GLUCOSE-CAPILLARY: 93 mg/dL (ref 65–99)
GLUCOSE-CAPILLARY: 98 mg/dL (ref 65–99)
GLUCOSE-CAPILLARY: 117 mg/dL — AB (ref 65–99)

## 2015-03-30 SURGERY — V TACH ABLATION
Anesthesia: LOCAL

## 2015-03-30 MED ORDER — PROBENECID 500 MG PO TABS
500.0000 mg | ORAL_TABLET | Freq: Two times a day (BID) | ORAL | Status: DC
  Filled 2015-03-30 (×2): qty 1

## 2015-03-30 MED ORDER — SODIUM CHLORIDE 0.9 % IV SOLN
250.0000 mL | INTRAVENOUS | Status: DC | PRN
Start: 2015-03-30 — End: 2015-03-30

## 2015-03-30 MED ORDER — HEPARIN SODIUM (PORCINE) 1000 UNIT/ML IJ SOLN
INTRAMUSCULAR | Status: AC
  Filled 2015-03-30: qty 1

## 2015-03-30 MED ORDER — ACETAMINOPHEN 325 MG PO TABS: 650.0000 mg | ORAL_TABLET | ORAL | Status: DC | PRN

## 2015-03-30 MED ORDER — FENTANYL CITRATE (PF) 100 MCG/2ML IJ SOLN
INTRAMUSCULAR | Status: AC
  Filled 2015-03-30: qty 2

## 2015-03-30 MED ORDER — HEPARIN (PORCINE) IN NACL 2-0.9 UNIT/ML-% IJ SOLN
INTRAMUSCULAR | Status: DC | PRN
  Administered 2015-03-30: 09:00:00

## 2015-03-30 MED ORDER — ONDANSETRON HCL 4 MG/2ML IJ SOLN: 4.0000 mg | Freq: Four times a day (QID) | INTRAMUSCULAR | Status: DC | PRN

## 2015-03-30 MED ORDER — SODIUM CHLORIDE 0.9 % IJ SOLN: 3.0000 mL | INTRAMUSCULAR | Status: DC | PRN

## 2015-03-30 MED ORDER — HEPARIN SODIUM (PORCINE) 1000 UNIT/ML IJ SOLN
INTRAMUSCULAR | Status: DC | PRN
  Administered 2015-03-30: 15000 [IU] via INTRAVENOUS

## 2015-03-30 MED ORDER — SODIUM CHLORIDE 0.9 % IV SOLN
INTRAVENOUS | Status: DC | PRN
  Administered 2015-03-30: 50 mL/h via INTRAVENOUS

## 2015-03-30 MED ORDER — MIDAZOLAM HCL 5 MG/5ML IJ SOLN
INTRAMUSCULAR | Status: DC | PRN
  Administered 2015-03-30 (×5): 1 mg via INTRAVENOUS

## 2015-03-30 MED ORDER — SODIUM CHLORIDE 0.9 % IJ SOLN: 3.0000 mL | Freq: Two times a day (BID) | INTRAMUSCULAR | Status: DC

## 2015-03-30 MED ORDER — HEPARIN SODIUM (PORCINE) 1000 UNIT/ML IJ SOLN
INTRAMUSCULAR | Status: DC | PRN
  Administered 2015-03-30: 1000 [IU] via INTRAVENOUS

## 2015-03-30 MED ORDER — BUPIVACAINE HCL (PF) 0.25 % IJ SOLN
INTRAMUSCULAR | Status: DC | PRN
  Administered 2015-03-30: 49 mL

## 2015-03-30 MED ORDER — MIDAZOLAM HCL 5 MG/5ML IJ SOLN
INTRAMUSCULAR | Status: AC
  Filled 2015-03-30: qty 5

## 2015-03-30 MED ORDER — BUPIVACAINE HCL (PF) 0.25 % IJ SOLN
INTRAMUSCULAR | Status: AC
  Filled 2015-03-30: qty 60

## 2015-03-30 MED ORDER — SIMVASTATIN 40 MG PO TABS
40.0000 mg | ORAL_TABLET | Freq: Every day | ORAL | Status: DC
  Administered 2015-03-30: 40 mg via ORAL
  Filled 2015-03-30: qty 1

## 2015-03-30 MED ORDER — HEPARIN (PORCINE) IN NACL 2-0.9 UNIT/ML-% IJ SOLN
INTRAMUSCULAR | Status: AC
  Filled 2015-03-30: qty 500

## 2015-03-30 MED ORDER — METFORMIN HCL 500 MG PO TABS
500.0000 mg | ORAL_TABLET | Freq: Two times a day (BID) | ORAL | Status: DC
  Administered 2015-03-30: 500 mg via ORAL
  Filled 2015-03-30: qty 1

## 2015-03-30 MED ORDER — FENTANYL CITRATE (PF) 100 MCG/2ML IJ SOLN
INTRAMUSCULAR | Status: DC | PRN
  Administered 2015-03-30 (×2): 25 ug via INTRAVENOUS
  Administered 2015-03-30 (×2): 12.5 ug via INTRAVENOUS

## 2015-03-30 MED ORDER — MOMETASONE FURO-FORMOTEROL FUM 100-5 MCG/ACT IN AERO
2.0000 | INHALATION_SPRAY | Freq: Two times a day (BID) | RESPIRATORY_TRACT | Status: DC
  Filled 2015-03-30: qty 8.8

## 2015-03-30 MED ORDER — PROTAMINE SULFATE 10 MG/ML IV SOLN
INTRAVENOUS | Status: DC | PRN
  Administered 2015-03-30: 30 mg via INTRAVENOUS

## 2015-03-30 SURGICAL SUPPLY — 17 items
BAG SNAP BAND KOVER 36X36 (MISCELLANEOUS) ×2 IMPLANT
BLANKET WARM UNDERBOD FULL ACC (MISCELLANEOUS) ×2 IMPLANT
CATH JOSEPH QUAD ALLRED 6F REP (CATHETERS) ×2 IMPLANT
CATH NAVISTAR SMARTTOUCH DF (ABLATOR) ×2 IMPLANT
CATH SOUNDSTAR ECO REPROCESSED (CATHETERS) ×2 IMPLANT
COVER SWIFTLINK CONNECTOR (BAG) ×2 IMPLANT
PACK EP LATEX FREE (CUSTOM PROCEDURE TRAY) ×1
PACK EP LF (CUSTOM PROCEDURE TRAY) ×1 IMPLANT
PAD DEFIB LIFELINK (PAD) ×2 IMPLANT
PATCH CARTO3 (PAD) ×2 IMPLANT
SHEATH AVANTI 11F 11CM (SHEATH) ×2 IMPLANT
SHEATH PINNACLE 6F 10CM (SHEATH) ×2 IMPLANT
SHEATH PINNACLE 8F 10CM (SHEATH) ×4 IMPLANT
SHIELD RADPAD SCOOP 12X17 (MISCELLANEOUS) ×2 IMPLANT
TUBING ART PRESS 72  MALE/FEM (TUBING) ×1
TUBING ART PRESS 72 MALE/FEM (TUBING) ×1 IMPLANT
TUBING SMART ABLATE COOLFLOW (TUBING) ×2 IMPLANT

## 2015-03-30 NOTE — Progress Notes (Signed)
    Site area: Right groin a 8 french arterial and a 8 french venous sheath was removed  Site Prior to Removal:  Level 0  Pressure Applied For 15 MINUTES    Minutes Beginning at 1115a  Manual:   Yes.    Patient Status During Pull:  stable  Post Pull Groin Site:  Level 0  Post Pull Instructions Given:  Yes.    Post Pull Pulses Present:  Yes.    Dressing Applied:  Yes.    Comments:  VS remain stable during sheath pull.

## 2015-03-30 NOTE — Progress Notes (Signed)
The patient has been seen by [name], cleared to discharge after bedrest period is completed.  To discharge after 1730 tonight.  Tommye Standard, PA-C

## 2015-03-30 NOTE — H&P (Signed)
ID: Mark Singh, DOB 01-27-1946, MRN [id number]  PCP: [name], MD Cardiologist: [name] Primary Electrophysiologist: [name], MD   Chief Complaint  Patient presents with  . PVC  . Hypertension    History of Present Illness: Mark Singh is a 69 y.o. male who presents today for electrophysiology evaluation. He has a history PVCs, bradycardia, nephrotic syndrome (complete responder to steroid therapy by Dr. Lorrene Reid), DM, HTN, HLD, obesity, orthostatic hypotension, mild pulm HTN felt 2/2 obesity who presents for evaluation of bradycardia. In 2014 he had skin cancer surgery and was tired all the time at that point. HR at that point was in the 40's, during colonoscopy and ECG showed frequent PVCs/ventricular trigeminy. He wore a Holter 05/2013 showing 18k PVCs (18% of the time), min HR 47bpm, mean rate 68bpm. He was started on metoprolol ER. Labs 05/2013 showed normal TSH, free T4 0.57, K 3.9, Cr 1.4. 2D echo 11/2013: EF 0000000, LV diastolic function parameters were normal, severely dilated LA, PASP 25mmHg.  He saw his PCP in September and was noted to have dizziness on standing. At that time his HR was 52 with bigeminy. He continues to have dizziness on standing. He has had HR at home of 34, possibly due to increased PVCs.  Today he feels well without major complaint.  Having PVCs still but without dizziness, weakness, shortness of breath, PND or orthopnea.  Past Medical History  Diagnosis Date  . Penile neoplasm   . Hyperlipidemia   . Type 2 diabetes mellitus ([place])   . H/O: gout     right foot  . Arthritis   . Seasonal asthma     mild  . Asthma   . HTN (hypertension)   . Orthostatic hypotension   . Myalgia and myositis, unspecified   . Edema     a. 2D echo 11/2013: EF 0000000, LV diastolic function parameters were normal, severely dilated LA, PASP 39mmHg..  .  Nephrotic syndrome     a. responder to steroid therapy by Dr. Lorrene Reid.   . Back pain   . Shoulder pain   . PVC's (premature ventricular contractions)     a. Holter 05/2013 showing 18k PVCs (18% of the time).  . Bradycardia     a. Repoted h/o HR in the 40s.  . Obesity   . Pulmonary hypertension ([place])     a. Mild pulm HTN felt 2/2 obesity.   Past Surgical History  Procedure Laterality Date  . Umbilical hernia repair  11-04-2005  . Left foot surgery  1992 & 2008    REMOVAL HEAL SPUR/ BUNIONECTOMY AND HAMMERTOE REPAIR X2  . Circumcision N/A 11/27/2012    Procedure: CIRCUMCISION AND EXCISION OF GLANS PENIS; Surgeon: Fredricka Bonine, MD; Location: [facility]; Service: Urology; Laterality: N/A;  . Hammer toe surgery       Current Outpatient Prescriptions  Medication Sig Dispense Refill  . aspirin 81 MG tablet Take 1 tablet (81 mg total) by mouth daily. 30 tablet 0  . Fluticasone-Salmeterol (ADVAIR DISKUS) 100-50 MCG/DOSE AEPB Inhale 1 puff into the lungs 2 (two) times daily as needed (short of breath).     [name] glucose blood (ONE TOUCH ULTRA TEST) test strip Inject into the skin as directed.     . metFORMIN (GLUCOPHAGE) 500 MG tablet Take 500 mg by mouth 2 (two) times daily with a meal.    . nystatin (MYCOSTATIN) powder Apply topically as directed.     . pimecrolimus (ELIDEL) 1 %  cream Apply topically as directed.     . probenecid (BENEMID) 500 MG tablet Take 500 mg by mouth 2 (two) times daily.     . simvastatin (ZOCOR) 40 MG tablet Take 40 mg by mouth daily.    . vitamin B-12 (CYANOCOBALAMIN) 1000 MCG tablet Take 1,000 mcg by mouth daily.    [name] ZOSTAVAX 69629 UNT/0.65ML injection as directed.      No current facility-administered medications for this visit.    Allergies: Indomethacin   Social History: The patient  reports that he has  never smoked. He has never used smokeless tobacco. He reports that he does not drink alcohol or use illicit drugs.   Family History: The patient's family history includes CVA in his father; Coronary artery disease in his mother.    ROS: Please see the history of present illness. Otherwise, review of systems is positive for palpitations. All other systems are reviewed and negative.    PHYSICAL EXAM: VS: BP 140/70 mmHg  Pulse 87  Ht 5\' 9"  (1.753 m)  Wt 250 lb 9.6 oz (113.671 kg)  BMI 36.99 kg/m2 , BMI Body mass index is 36.99 kg/(m^2). GEN: Well nourished, well developed, in no acute distress  HEENT: normal  Neck: no JVD, carotid bruits, or masses Cardiac: irregular rhythm; no murmurs, rubs, or gallops,no edema  Respiratory: clear to auscultation bilaterally, normal work of breathing GI: soft, nontender, nondistended, + BS MS: no deformity or atrophy  Skin: warm and dry Neuro: Strength and sensation are intact Psych: euthymic mood, full affect  EKG: EKG is ordered today. The ekg ordered today shows sinus rhythm, trigeminy with occasional junctional beats  Recent Labs: 02/03/2015: BUN 15; Creat 1.35*; Magnesium 1.9; Potassium 4.4; Sodium 140; TSH 3.091    Lipid Panel   Labs (Brief)    No results found for: CHOL, TRIG, HDL, CHOLHDL, VLDL, LDLCALC, LDLDIRECT     Wt Readings from Last 3 Encounters:  02/27/15 250 lb 9.6 oz (113.671 kg)  02/03/15 245 lb (111.131 kg)  06/25/14 270 lb 6.4 oz (122.653 kg)      Other studies Reviewed: Additional studies/ records that were reviewed today include: TTE 2015 Review of the above records today demonstrates:  - Left ventricle: The cavity size was normal. There was mild concentric hypertrophy. Systolic function was normal. The estimated ejection fraction was in the range of 55% to 60%. Although no diagnostic regional wall motion abnormality was identified, this possibility cannot be completely excluded on  the basis of this study. Left ventricular diastolic function parameters were normal. - Left atrium: The atrium was severely dilated. - Pulmonary arteries: Systolic pressure was mildly increased. PA peak pressure: 38 mm Hg (S).  Holter   53000 PVC's   Rare ventricular triplet   Ventricular bigeminy 990   Longest pause 2.2 seconds   Mean heart rate 78bpm, lowest 39bpm at 4:18am.  ASSESSMENT AND PLAN:  1. PVCs: high burden with low HR. This could be due to his PVCs and a lack of perfusion due to them. This could also be the cause of his dizziness on standing. His PVCs appear to be coming from the anterior base of the heart, likely more posterior.Ablation planned for today, will use CARTO and ICE for mapping.  Likely from the LVOT/coronary cusps.

## 2015-03-30 NOTE — Progress Notes (Signed)
   Site area: Left  Groin a 6, and 11 french venous sheath was removed  Site Prior to Removal:  Level 0  Pressure Applied For 15 MINUTES    Minutes Beginning at 1100am  Manual:   Yes.    Patient Status During Pull:  stable  Post Pull Groin Site:  Level 0  Post Pull Instructions Given:  Yes.    Post Pull Pulses Present:  Yes.    Dressing Applied:  Yes.    Comments:  VS remain stable during sheath pull

## 2015-03-30 NOTE — Plan of Care (Signed)
Problem: Education: Goal: Knowledge of [facility] Education information/materials will improve Outcome: Completed/Met Date Met:  03/30/15 Pt educated

## 2015-03-30 NOTE — Discharge Instructions (Signed)
No driving for 1 week. No lifting over 5 lbs for 1 week. No sexual activity for 1 week. Keep procedure site clean & dry. If you notice increased pain, swelling, bleeding or pus, call/return!  You may shower, but no soaking baths/hot tubs/pools for 1 week.   

## 2015-04-06 ENCOUNTER — Telehealth: Payer: Self-pay | Admitting: Cardiology

## 2015-04-06 NOTE — Telephone Encounter (Signed)
New message      Pt had an ablation on last Monday----when can he start walking on a treadmill at the gym?

## 2015-04-06 NOTE — Telephone Encounter (Signed)
Informed that it would be ok to begin walking on treadmill tomorrow.  He does not use incline treadmill. Patient will call if issues arise.

## 2015-04-16 ENCOUNTER — Other Ambulatory Visit: Payer: Self-pay | Admitting: Cardiology

## 2015-04-27 ENCOUNTER — Ambulatory Visit (INDEPENDENT_AMBULATORY_CARE_PROVIDER_SITE_OTHER): Payer: Medicare Other | Admitting: Podiatry

## 2015-04-27 ENCOUNTER — Encounter: Payer: Self-pay | Admitting: Podiatry

## 2015-04-27 VITALS — BP 157/83 | HR 57 | Resp 18

## 2015-04-27 DIAGNOSIS — B351 Tinea unguium: Secondary | ICD-10-CM | POA: Diagnosis not present

## 2015-04-27 DIAGNOSIS — M79609 Pain in unspecified limb: Principal | ICD-10-CM

## 2015-04-27 NOTE — Progress Notes (Signed)
   Subjective:    Patient ID: Mark Singh, male    DOB: 08/10/1945, 69 y.o.   MRN: [id number]  HPI I NEED MY TOENAILS TRIMMED UP  This patient presents to the office stating he is diabetic and he desires to have his nails worked on. He also says there are callus on the bottom of his feet, but they are not painful at this time. He states that the nails are painful as he walks and wears his shoes. He presents the office for an evaluation and treatment of this condition   Review of Systems  All other systems reviewed and are negative.      Objective:   Physical Exam GENERAL APPEARANCE: Alert, conversant. Appropriately groomed. No acute distress.  VASCULAR: Pedal pulses palpable at  [facility] and PT bilateral.  Capillary refill time is immediate to all digits,  Normal temperature gradient.  Digital hair growth is present bilateral  NEUROLOGIC: sensation is normal to 5.07 monofilament at 5/5 sites bilateral.  Light touch is intact bilateral, Muscle strength normal.  MUSCULOSKELETAL: acceptable muscle strength, tone and stability bilateral.  Intrinsic muscluature intact bilateral.  Rectus appearance of foot and digits noted bilateral. HAV 1st MPJ right foot  S/P foot surgery 1st MPJ left foot.  DERMATOLOGIC: skin color, texture, and turgor are within normal limits.  No preulcerative lesions or ulcers  are seen, no interdigital maceration noted.  No open lesions present.  . No drainage noted. Asymptomatic sub 5th callus  Nails  Thick disfigured discolored great toenails both feet.         Assessment & Plan:  Onychomycosis B/L  IE  Debridement of Nails.  RTC 3 months   Gardiner Barefoot DPM

## 2015-04-28 NOTE — Progress Notes (Signed)
Cardiology Office Note Date:  04/29/2015  Patient ID:  Mark Singh, DOB 1945/11/26, MRN [id number] PCP:  [name], MD  Cardiologist:  Dr. Marlou Porch Electrophysiologist: [name]   Chief Complaint: office f/u s/p PVC ablation on 03/30/15 by [name]  History of Present Illness: Mark Singh is a 69 y.o. male with history of PVC's, bradycardia, nephrotic syndrome (complete responder to steroid therapy by Dr. Lorrene Reid), DM, HTN, HLD, obesity, orthostatic hypotension, and mild pulm HTN felt 2/2 obesity  Comes in today now s/p ablation procedure for his PVC's.  He feels much better, looking forward to getting back to exercise.   He has had no dizziness, no orthostatic symptoms at all since his procedure.  He mentions a couple episodes of left sided/infraclavicuar pains, very fleeting lasting less then a second, sharp, nonradiating, no associated symptoms, and none in a couple weeks.  He feels very well.  No palpitations, no near syncope or syncope.  He denies any pain, bleeding or complications with his procedure sites.   Past Medical History  Diagnosis Date  . Penile neoplasm   . Hyperlipidemia   . Type 2 diabetes mellitus ([place])   . H/O: gout     right foot  . Arthritis   . Seasonal asthma     mild  . Asthma   . HTN (hypertension)   . Orthostatic hypotension   . Myalgia and myositis, unspecified   . Edema     a. 2D echo 11/2013: EF 0000000, LV diastolic function parameters were normal, severely dilated LA, PASP 35mmHg..  . Nephrotic syndrome     a. responder to steroid therapy by Dr. Lorrene Reid.   . Back pain   . Shoulder pain   . PVC's (premature ventricular contractions)     a. Holter 05/2013 showing 18k PVCs (18% of the time).  . Bradycardia     a. Repoted h/o HR in the 40s.  . Obesity   . Pulmonary hypertension ([place])     a. Mild pulm HTN felt 2/2 obesity.    Past Surgical History  Procedure Laterality Date  . Umbilical hernia repair  11-04-2005    . Left foot surgery  1992  &  2008    REMOVAL HEAL SPUR/ BUNIONECTOMY AND HAMMERTOE REPAIR X2  . Circumcision N/A 11/27/2012    Procedure:  CIRCUMCISION  AND EXCISION OF GLANS PENIS;  Surgeon: Fredricka Bonine, MD;  Location: [facility];  Service: Urology;  Laterality: N/A;  . Hammer toe surgery    . Electrophysiologic study N/A 03/30/2015    Procedure: PVC Ablation;  Surgeon: [name], MD;  Location: [facility];  Service: Cardiovascular;  Laterality: N/A;    Current Outpatient Prescriptions  Medication Sig Dispense Refill  . aspirin 81 MG tablet Take 1 tablet (81 mg total) by mouth daily. 30 tablet 0  . Fluticasone-Salmeterol (ADVAIR DISKUS) 100-50 MCG/DOSE AEPB Inhale 1 puff into the lungs 2 (two) times daily as needed (short of breath).     . metFORMIN (GLUCOPHAGE) 500 MG tablet Take 500 mg by mouth 2 (two) times daily with a meal.    . nystatin (MYCOSTATIN) powder Apply topically as directed.     . ONE TOUCH ULTRA TEST test strip USE TO TEST BLOOD SUGAR ONCE A DAY  5  . pimecrolimus (ELIDEL) 1 % cream Apply topically as directed.     . probenecid (BENEMID) 500 MG tablet Take 500 mg by mouth 2 (two) times daily.     [name]  simvastatin (ZOCOR) 40 MG tablet Take 40 mg by mouth daily.    . vitamin B-12 (CYANOCOBALAMIN) 1000 MCG tablet Take 1,000 mcg by mouth daily.     No current facility-administered medications for this visit.    Allergies:   Indomethacin   Social History:  The patient  reports that he has never smoked. He has never used smokeless tobacco. He reports that he does not drink alcohol or use illicit drugs.   Family History:  The patient's family history includes CVA in his father; Coronary artery disease in his mother.  ROS:  Please see the history of present illness.  All other systems are reviewed and otherwise negative.   PHYSICAL EXAM:  VS:  BP 144/76 mmHg  Pulse 56  Ht 5\' 9"  (1.753 m)  Wt 256 lb 3.2 oz (116.212 kg)  BMI  37.82 kg/m2  SpO2 98% BMI: Body mass index is 37.82 kg/(m^2). Well nourished, well developed, in no acute distress HEENT: normocephalic, atraumatic Neck: no JVD, carotid bruits or masses Cardiac:  normal S1, S2; RRR; no significant murmurs, no rubs, or gallops Lungs:  clear to auscultation bilaterally, no wheezing, rhonchi or rales Abd: soft, nontender MS: no deformity or atrophy Ext: no edema,  2+ pedal pulses b/l Skin: warm and dry, no rash Neuro:  No gross deficits appreciated Psych: euthymic mood, full affect  EKG:  Done today shows SB, 56bpm.  No PVCs 03/30/15: EKG is SR, frequent PVCs in trigemenal pattern  03/30/15: PROCEDURE: 1. Comprehensive EP study. 2. Left atrial and left ventricular pacing and recording. 3. A 3D mapping of PVCs. 4. Radiofrequency ablation of PVC. 5. Arterial blood pressure monitoring. 6. Intracardiac echocardiography  02/04/15: 48 hour holter   53000 PVC's   Rare ventricular triplet   Ventricular bigeminy 990   Longest pause 2.2 seconds   Mean heart rate 78bpm, lowest 39bpm at 4:18am. With frequent PVCs, 53,000 total, please have electrophysiology evaluate. [name], MD  12/27/13: Echocardiogram Study Conclusions - Left ventricle: The cavity size was normal. There was mild concentric hypertrophy. Systolic function was normal. The estimated ejection fraction was in the range of 55% to 60%. Although no diagnostic regional wall motion abnormality was identified, this possibility cannot be completely excluded on the basis of this study. Left ventricular diastolic function parameters were normal. - Left atrium: The atrium was severely dilated. - Pulmonary arteries: Systolic pressure was mildly increased. PA peak pressure: 38 mm Hg (S).  Recent Labs: 02/03/2015: Magnesium 1.9; TSH 3.091 03/23/2015: BUN 14; Creat 1.26*; Hemoglobin 15.9; Platelets 318; Potassium 4.6; Sodium 139   Wt Readings from Last 3 Encounters:    04/29/15 256 lb 3.2 oz (116.212 kg)  03/30/15 246 lb (111.585 kg)  02/27/15 250 lb 9.6 oz (113.671 kg)     Other studies reviewed: Additional studies/records reviewed today include: summarized above   ASSESSMENT AND PLAN:  1. PVC's S/p ablation 03/30/15 with Dr. Fredna Dow very well  2. Bradycardia holter noted as above No symptoms of bradycardia  3. Dizziness Described as orthostatic in nature by notes Reported as resolved by the patient  4. BP today is 144/80 He is encouraged to walk/exercise, minimize his sodium intake, eating a significant amount of canned foods and weight loss He will keep an eye on his home BP and f/u with his PMD and/or Dr. Marlou Porch   Disposition: F/u with [name] as scheduled  Current medicines are reviewed at length with the patient today.  The patient did not have any  concerns regarding medicines.  Haywood Lasso, PA-C 04/29/2015 10:36 AM     [facility] [address] [place] [address] [phone] (office)  [phone] (fax)

## 2015-04-29 ENCOUNTER — Encounter: Payer: Self-pay | Admitting: Physician Assistant

## 2015-04-29 ENCOUNTER — Ambulatory Visit (INDEPENDENT_AMBULATORY_CARE_PROVIDER_SITE_OTHER): Payer: Medicare Other | Admitting: Physician Assistant

## 2015-04-29 VITALS — BP 144/76 | HR 56 | Ht 69.0 in | Wt 256.2 lb

## 2015-04-29 DIAGNOSIS — R9431 Abnormal electrocardiogram [ECG] [EKG]: Secondary | ICD-10-CM | POA: Diagnosis not present

## 2015-04-29 DIAGNOSIS — I493 Ventricular premature depolarization: Secondary | ICD-10-CM

## 2015-04-29 NOTE — Patient Instructions (Signed)
Medication Instructions:   CONTINUE SAME MEDICATIONS  If you need a refill on your cardiac medications before your next appointment, please call your pharmacy.  Labwork: NONE ORDER TODAY  Testing/Procedures: NONE ORDER TODAY   Follow-Up: WITH DR CAMNITZ AS SCHEDULED   Any Other Special Instructions Will Be Listed Below (If Applicable).

## 2015-06-30 ENCOUNTER — Ambulatory Visit (INDEPENDENT_AMBULATORY_CARE_PROVIDER_SITE_OTHER): Payer: Medicare Other | Admitting: Cardiology

## 2015-06-30 ENCOUNTER — Encounter: Payer: Self-pay | Admitting: Cardiology

## 2015-06-30 VITALS — BP 124/82 | HR 83 | Ht 69.0 in | Wt 255.6 lb

## 2015-06-30 DIAGNOSIS — I4891 Unspecified atrial fibrillation: Secondary | ICD-10-CM

## 2015-06-30 DIAGNOSIS — I493 Ventricular premature depolarization: Secondary | ICD-10-CM | POA: Diagnosis not present

## 2015-06-30 DIAGNOSIS — G4710 Hypersomnia, unspecified: Secondary | ICD-10-CM | POA: Diagnosis not present

## 2015-06-30 DIAGNOSIS — R0683 Snoring: Secondary | ICD-10-CM

## 2015-06-30 DIAGNOSIS — R400 Somnolence: Secondary | ICD-10-CM

## 2015-06-30 DIAGNOSIS — I480 Paroxysmal atrial fibrillation: Secondary | ICD-10-CM | POA: Insufficient documentation

## 2015-06-30 MED ORDER — RIVAROXABAN 20 MG PO TABS: 20.0000 mg | ORAL_TABLET | Freq: Every day | ORAL | Status: DC

## 2015-06-30 NOTE — Patient Instructions (Signed)
Medication Instructions:  Your physician has recommended you make the following change in your medication:  1) START Xarelto 20 mg daily at dinnertime  Labwork: None ordered  Testing/Procedures: Your physician has recommended that you have a Cardioversion (DCCV). Electrical Cardioversion uses a jolt of electricity to your heart either through paddles or wired patches attached to your chest. This is a controlled, usually prescheduled, procedure. Defibrillation is done under light anesthesia in the hospital, and you usually go home the day of the procedure. This is done to get your heart back into a normal rhythm. You are not awake for the procedure.   Sherri, RN will call you to schedule this procedure.  You will need to be on Xarelto 3-4 weeks prior to cardioversion.   Your physician has recommended that you have a sleep study. This test records several body functions during sleep, including: brain activity, eye movement, oxygen and carbon dioxide blood levels, heart rate and rhythm, breathing rate and rhythm, the flow of air through your mouth and nose, snoring, body muscle movements, and chest and belly movement.  Follow-Up: Your physician recommends that you schedule a follow-up appointment in: 3 months with [name].   If you need a refill on your cardiac medications before your next appointment, please call your pharmacy.  Thank you for choosing [facility]!!   [name], RN [phone]  Any Other Special Instructions Will Be Listed Below (If Applicable).  Atrial Fibrillation Atrial fibrillation is a type of irregular or rapid heartbeat (arrhythmia). In atrial fibrillation, the heart quivers continuously in a chaotic pattern. This occurs when parts of the heart receive disorganized signals that make the heart unable to pump blood normally. This can increase the risk for stroke, heart failure, and other heart-related conditions. There are different types of atrial  fibrillation, including:  Paroxysmal atrial fibrillation. This type starts suddenly, and it usually stops on its own shortly after it starts.  Persistent atrial fibrillation. This type often lasts longer than a week. It may stop on its own or with treatment.  Long-lasting persistent atrial fibrillation. This type lasts longer than 12 months.  Permanent atrial fibrillation. This type does not go away. Talk with your health care provider to learn about the type of atrial fibrillation that you have. CAUSES This condition is caused by some heart-related conditions or procedures, including:  A heart attack.  Coronary artery disease.  Heart failure.  Heart valve conditions.  High blood pressure.  Inflammation of the sac that surrounds the heart (pericarditis).  Heart surgery.  Certain heart rhythm disorders, such as Wolf-Parkinson-White syndrome. Other causes include:  Pneumonia.  Obstructive sleep apnea.  Blockage of an artery in the lungs (pulmonary embolism, or PE).  Lung cancer.  Chronic lung disease.  Thyroid problems, especially if the thyroid is overactive (hyperthyroidism).  Caffeine.  Excessive alcohol use or illegal drug use.  Use of some medicines, including certain decongestants and diet pills. Sometimes, the cause cannot be found. RISK FACTORS This condition is more likely to develop in:  People who are older in age.  People who smoke.  People who have diabetes mellitus.  People who are overweight (obese).  Athletes who exercise vigorously. SYMPTOMS Symptoms of this condition include:  A feeling that your heart is beating rapidly or irregularly.  A feeling of discomfort or pain in your chest.  Shortness of breath.  Sudden light-headedness or weakness.  Getting tired easily during exercise. In some cases, there are no symptoms. DIAGNOSIS Your health  care provider may be able to detect atrial fibrillation when taking your pulse. If  detected, this condition may be diagnosed with:  An electrocardiogram (ECG).  A Holter monitor test that records your heartbeat patterns over a 24-hour period.  Transthoracic echocardiogram (TTE) to evaluate how blood flows through your heart.  Transesophageal echocardiogram (TEE) to view more detailed images of your heart.  A stress test.  Imaging tests, such as a CT scan or chest X-ray.  Blood tests. TREATMENT The main goals of treatment are to prevent blood clots from forming and to keep your heart beating at a normal rate and rhythm. The type of treatment that you receive depends on many factors, such as your underlying medical conditions and how you feel when you are experiencing atrial fibrillation. This condition may be treated with:  Medicine to slow down the heart rate, bring the heart's rhythm back to normal, or prevent clots from forming.  Electrical cardioversion. This is a procedure that resets your heart's rhythm by delivering a controlled, low-energy shock to the heart through your skin.  Different types of ablation, such as catheter ablation, catheter ablation with pacemaker, or surgical ablation. These procedures destroy the heart tissues that send abnormal signals. When the pacemaker is used, it is placed under your skin to help your heart beat in a regular rhythm. HOME CARE INSTRUCTIONS  Take over-the counter and prescription medicines only as told by your health care provider.  If your health care provider prescribed a blood-thinning medicine (anticoagulant), take it exactly as told. Taking too much blood-thinning medicine can cause bleeding. If you do not take enough blood-thinning medicine, you will not have the protection that you need against stroke and other problems.  Do not use tobacco products, including cigarettes, chewing tobacco, and e-cigarettes. If you need help quitting, ask your health care provider.  If you have obstructive sleep apnea, manage your  condition as told by your health care provider.  Do not drink alcohol.  Do not drink beverages that contain caffeine, such as coffee, soda, and tea.  Maintain a healthy weight. Do not use diet pills unless your health care provider approves. Diet pills may make heart problems worse.  Follow diet instructions as told by your health care provider.  Exercise regularly as told by your health care provider.  Keep all follow-up visits as told by your health care provider. This is important. PREVENTION  Avoid drinking beverages that contain caffeine or alcohol.  Avoid certain medicines, especially medicines that are used for breathing problems.  Avoid certain herbs and herbal medicines, such as those that contain ephedra or ginseng.  Do not use illegal drugs, such as cocaine and amphetamines.  Do not smoke.  Manage your high blood pressure. SEEK MEDICAL CARE IF:  You notice a change in the rate, rhythm, or strength of your heartbeat.  You are taking an anticoagulant and you notice increased bruising.  You tire more easily when you exercise or exert yourself. SEEK IMMEDIATE MEDICAL CARE IF:  You have chest pain, abdominal pain, sweating, or weakness.  You feel nauseous.  You notice blood in your vomit, bowel movement, or urine.  You have shortness of breath.  You suddenly have swollen feet and ankles.  You feel dizzy.  You have sudden weakness or numbness of the face, arm, or leg, especially on one side of the body.  You have trouble speaking, trouble understanding, or both (aphasia).  Your face or your eyelid droops on one side. These symptoms  may represent a serious problem that is an emergency. Do not wait to see if the symptoms will go away. Get medical help right away. Call your local emergency services (911 in the [place] Do not drive yourself to the hospital.   This information is not intended to replace advice given to you by your health care provider. Make sure  you discuss any questions you have with your health care provider.   Document Released: 04/18/2005 Document Revised: 01/07/2015 Document Reviewed: 08/13/2014 Elsevier Interactive Patient Education 2016 Elsevier Inc.   Rivaroxaban oral tablets What is this medicine? RIVAROXABAN (ri va ROX a ban) is an anticoagulant (blood thinner). It is used to treat blood clots in the lungs or in the veins. It is also used after knee or hip surgeries to prevent blood clots. It is also used to lower the chance of stroke in people with a medical condition called atrial fibrillation. This medicine may be used for other purposes; ask your health care provider or pharmacist if you have questions. What should I tell my health care provider before I take this medicine? They need to know if you have any of these conditions: -bleeding disorders -bleeding in the brain -blood in your stools (black or tarry stools) or if you have blood in your vomit -history of stomach bleeding -kidney disease -liver disease -low blood counts, like low white cell, platelet, or red cell counts -recent or planned spinal or epidural procedure -take medicines that treat or prevent blood clots -an unusual or allergic reaction to rivaroxaban, other medicines, foods, dyes, or preservatives -pregnant or trying to get pregnant -breast-feeding How should I use this medicine? Take this medicine by mouth with a glass of water. Follow the directions on the prescription label. Take your medicine at regular intervals. Do not take it more often than directed. Do not stop taking except on your doctor's advice. Stopping this medicine may increase your risk of a blood clot. Be sure to refill your prescription before you run out of medicine. If you are taking this medicine after hip or knee replacement surgery, take it with or without food. If you are taking this medicine for atrial fibrillation, take it with your evening meal. If you are taking this  medicine to treat blood clots, take it with food at the same time each day. If you are unable to swallow your tablet, you may crush the tablet and mix it in applesauce. Then, immediately eat the applesauce. You should eat more food right after you eat the applesauce containing the crushed tablet. Talk to your pediatrician regarding the use of this medicine in children. Special care may be needed. Overdosage: If you think you have taken too much of this medicine contact a poison control center or emergency room at once. NOTE: This medicine is only for you. Do not share this medicine with others. What if I miss a dose? If you take your medicine once a day and miss a dose, take the missed dose as soon as you remember. If you take your medicine twice a day and miss a dose, take the missed dose immediately. In this instance, 2 tablets may be taken at the same time. The next day you should take 1 tablet twice a day as directed. What may interact with this medicine? -aspirin and aspirin-like medicines -certain antibiotics like erythromycin, azithromycin, and clarithromycin -certain medicines for fungal infections like ketoconazole and itraconazole -certain medicines for irregular heart beat like amiodarone, quinidine, dronedarone -certain medicines for seizures  like carbamazepine, phenytoin -certain medicines that treat or prevent blood clots like warfarin, enoxaparin, and dalteparin -conivaptan -diltiazem -felodipine -indinavir -lopinavir; ritonavir -NSAIDS, medicines for pain and inflammation, like ibuprofen or naproxen -ranolazine -rifampin -ritonavir -St. John's wort -verapamil This list may not describe all possible interactions. Give your health care provider a list of all the medicines, herbs, non-prescription drugs, or dietary supplements you use. Also tell them if you smoke, drink alcohol, or use illegal drugs. Some items may interact with your medicine. What should I watch for while using  this medicine? Visit your doctor or health care professional for regular checks on your progress. Your condition will be monitored carefully while you are receiving this medicine. Notify your doctor or health care professional and seek emergency treatment if you develop breathing problems; changes in vision; chest pain; severe, sudden headache; pain, swelling, warmth in the leg; trouble speaking; sudden numbness or weakness of the face, arm, or leg. These can be signs that your condition has gotten worse. If you are going to have surgery, tell your doctor or health care professional that you are taking this medicine. Tell your health care professional that you use this medicine before you have a spinal or epidural procedure. Sometimes people who take this medicine have bleeding problems around the spine when they have a spinal or epidural procedure. This bleeding is very rare. If you have a spinal or epidural procedure while on this medicine, call your health care professional immediately if you have back pain, numbness or tingling (especially in your legs and feet), muscle weakness, paralysis, or loss of bladder or bowel control. Avoid sports and activities that might cause injury while you are using this medicine. Severe falls or injuries can cause unseen bleeding. Be careful when using sharp tools or knives. Consider using an Copy. Take special care brushing or flossing your teeth. Report any injuries, bruising, or red spots on the skin to your doctor or health care professional. What side effects may I notice from receiving this medicine? Side effects that you should report to your doctor or health care professional as soon as possible: -allergic reactions like skin rash, itching or hives, swelling of the face, lips, or tongue -back pain -redness, blistering, peeling or loosening of the skin, including inside the mouth -signs and symptoms of bleeding such as bloody or black, tarry stools;  red or dark-brown urine; spitting up blood or brown material that looks like coffee grounds; red spots on the skin; unusual bruising or bleeding from the eye, gums, or nose Side effects that usually do not require medical attention (Report these to your doctor or health care professional if they continue or are bothersome.): -dizziness -muscle pain This list may not describe all possible side effects. Call your doctor for medical advice about side effects. You may report side effects to FDA at 1-800-FDA-1088. Where should I keep my medicine? Keep out of the reach of children. Store at room temperature between 15 and 30 degrees C (59 and 86 degrees F). Throw away any unused medicine after the expiration date. NOTE: This sheet is a summary. It may not cover all possible information. If you have questions about this medicine, talk to your doctor, pharmacist, or health care provider.    2016, Elsevier/Gold Standard. (2014-04-16 12:45:34)

## 2015-06-30 NOTE — Progress Notes (Signed)
Electrophysiology Office Note   Date:  06/30/2015   ID:  Mark Singh, DOB Aug 02, 1945, MRN [id number]  PCP:  [name], MD  Cardiologist:  Marlou Porch Primary Electrophysiologist:  Brettany Sydney Meredith Leeds, MD    Chief Complaint  Patient presents with  . Follow-up  . Atrial Fibrillation     History of Present Illness: Mark Singh is a 70 y.o. male who presents today for electrophysiology evaluation.   history of PVC's, bradycardia, nephrotic syndrome (complete responder to steroid therapy by Dr. Lorrene Reid), DM, HTN, HLD, obesity, orthostatic hypotension, and mild pulm HTN felt 2/2 obesity Comes in today now s/p ablation procedure for his PVC's.  His PVCs were mapped to inferior to the [facility] and ablated.    Today, he denies symptoms of palpitations, chest pain, shortness of breath, orthopnea, PND, lower extremity edema, claudication, dizziness, presyncope, syncope, bleeding, or neurologic sequela. The patient is tolerating medications without difficulties and is otherwise without complaint today.  His EKG today is consistent with atrial fibrillation. He did not know that he had atrial fibrillation in the past. He has not been anticoagulated. He does not have palpitations, shortness of breath, fatigue or weakness. He was seen by a nutritionist a few weeks ago who did an EKG and told him that he should see a cardiologist.   Past Medical History  Diagnosis Date  . Penile neoplasm   . Hyperlipidemia   . Type 2 diabetes mellitus ([place])   . H/O: gout     right foot  . Arthritis   . Seasonal asthma     mild  . Asthma   . HTN (hypertension)   . Orthostatic hypotension   . Myalgia and myositis, unspecified   . Edema     a. 2D echo 11/2013: EF 0000000, LV diastolic function parameters were normal, severely dilated LA, PASP 28mmHg..  . Nephrotic syndrome     a. responder to steroid therapy by Dr. Lorrene Reid.   . Back pain   . Shoulder pain   . PVC's (premature ventricular  contractions)     a. Holter 05/2013 showing 18k PVCs (18% of the time).  . Bradycardia     a. Repoted h/o HR in the 40s.  . Obesity   . Pulmonary hypertension ([place])     a. Mild pulm HTN felt 2/2 obesity.   Past Surgical History  Procedure Laterality Date  . Umbilical hernia repair  11-04-2005  . Left foot surgery  1992  &  2008    REMOVAL HEAL SPUR/ BUNIONECTOMY AND HAMMERTOE REPAIR X2  . Circumcision N/A 11/27/2012    Procedure:  CIRCUMCISION  AND EXCISION OF GLANS PENIS;  Surgeon: Fredricka Bonine, MD;  Location: [facility];  Service: Urology;  Laterality: N/A;  . Hammer toe surgery    . Electrophysiologic study N/A 03/30/2015    Procedure: PVC Ablation;  Surgeon: Addiel Mccardle Meredith Leeds, MD;  Location: [facility];  Service: Cardiovascular;  Laterality: N/A;     Current Outpatient Prescriptions  Medication Sig Dispense Refill  . aspirin 81 MG tablet Take 1 tablet (81 mg total) by mouth daily. 30 tablet 0  . Cholecalciferol (VITAMIN D3) 5000 units CAPS Take 1 capsule by mouth daily.    . Fluticasone-Salmeterol (ADVAIR DISKUS) 100-50 MCG/DOSE AEPB Inhale 1 puff into the lungs 2 (two) times daily as needed (short of breath).     . metFORMIN (GLUCOPHAGE) 500 MG tablet Take 500 mg by mouth 2 (two) times daily with  a meal.    . nystatin (MYCOSTATIN) powder Apply topically as directed.     . pimecrolimus (ELIDEL) 1 % cream Apply topically as directed.     . probenecid (BENEMID) 500 MG tablet Take 500 mg by mouth 2 (two) times daily.     . simvastatin (ZOCOR) 40 MG tablet Take 40 mg by mouth daily.    . vitamin B-12 (CYANOCOBALAMIN) 1000 MCG tablet Take 1,000 mcg by mouth daily.     No current facility-administered medications for this visit.    Allergies:   Indomethacin   Social History:  The patient  reports that he has never smoked. He has never used smokeless tobacco. He reports that he does not drink alcohol or use illicit drugs.   Family History:  The  patient's family history includes CVA in his father; Coronary artery disease in his mother.    ROS:  Please see the history of present illness.   Otherwise, review of systems is positive for none.   All other systems are reviewed and negative.    PHYSICAL EXAM: VS:  BP 124/82 mmHg  Pulse 83  Ht 5\' 9"  (1.753 m)  Wt 255 lb 9.6 oz (115.939 kg)  BMI 37.73 kg/m2 , BMI Body mass index is 37.73 kg/(m^2). GEN: Well nourished, well developed, in no acute distress HEENT: normal Neck: no JVD, carotid bruits, or masses Cardiac: irregular; no murmurs, rubs, or gallops,no edema  Respiratory:  clear to auscultation bilaterally, normal work of breathing GI: soft, nontender, nondistended, + BS MS: no deformity or atrophy Skin: warm and dry Neuro:  Strength and sensation are intact Psych: euthymic mood, full affect  EKG:  EKG is ordered today. The ekg ordered today shows atrial fibrillation  Recent Labs: 02/03/2015: Magnesium 1.9; TSH 3.091 03/23/2015: BUN 14; Creat 1.26*; Hemoglobin 15.9; Platelets 318; Potassium 4.6; Sodium 139    Lipid Panel  No results found for: CHOL, TRIG, HDL, CHOLHDL, VLDL, LDLCALC, LDLDIRECT   Wt Readings from Last 3 Encounters:  06/30/15 255 lb 9.6 oz (115.939 kg)  04/29/15 256 lb 3.2 oz (116.212 kg)  03/30/15 246 lb (111.585 kg)      Other studies Reviewed: Additional studies/ records that were reviewed today include: TTE 12/28/14  Review of the above records today demonstrates:  - Left ventricle: The cavity size was normal. There was mild concentric hypertrophy. Systolic function was normal. The estimated ejection fraction was in the range of 55% to 60%. Although no diagnostic regional wall motion abnormality was identified, this possibility cannot be completely excluded on the basis of this study. Left ventricular diastolic function parameters were normal. - Left atrium: The atrium was severely dilated. - Pulmonary arteries: Systolic pressure  was mildly increased. PA peak pressure: 38 mm Hg (S).   ASSESSMENT AND PLAN:  1.  PVCs: had ablation done in November with good result.    2. Atrial fibrillation: Currently with no symptoms of atrial fibrillation. He does say that his heart rate has been up for the last few weeks though. Due to his new onset atrial fibrillation, we Gresham Caetano put him on Xarelto for anticoagulation. He has not had many symptoms, but in speaking with him, a trial of sinus rhythm would be good. Unfortunately his seems that he has been in atrial fibrillation for the past few weeks, and we would need a 3 week time span before cardioversion would be safe. Post cardioversion, we Sheniece Ruggles get an EKG to determine if he is still in sinus rhythm. If he  is not we would consider drug therapy at that time if he is having any symptoms. If he continues to be asymptomatic he may benefit from rate control only. He is currently not on any beta blockers, but is rate controlled, and we Tavin Vernet therefore not start him on any new medications. We've also given him reading materials on atrial fibrillation.  This patients CHA2DS2-VASc Score and unadjusted Ischemic Stroke Rate (% per year) is equal to 3.2 % stroke rate/year from a score of 3  Above score calculated as 1 point each if present [CHF, HTN, DM, Vascular=MI/PAD/Aortic Plaque, Age if 65-74, or Male] Above score calculated as 2 points each if present [Age > 75, or Stroke/TIA/TE]    Current medicines are reviewed at length with the patient today.   The patient does not have concerns regarding his medicines.  The following changes were made today:  Xarelto  Labs/ tests ordered today include:  No orders of the defined types were placed in this encounter.     Disposition:   FU with Charlen Bakula 3 months  Signed, Yamil Oelke Meredith Leeds, MD  06/30/2015 9:51 AM     [facility] [address] [place] [address] [phone] (office) [phone] (fax)

## 2015-07-02 ENCOUNTER — Telehealth: Payer: Self-pay

## 2015-07-02 NOTE — Telephone Encounter (Signed)
Prior auth for Xarelto 20mg  obtained through [employer]. Good through 05/01/2016. [id number].

## 2015-07-15 ENCOUNTER — Telehealth: Payer: Self-pay | Admitting: *Deleted

## 2015-07-15 ENCOUNTER — Encounter: Payer: Self-pay | Admitting: *Deleted

## 2015-07-15 DIAGNOSIS — Z01812 Encounter for preprocedural laboratory examination: Secondary | ICD-10-CM

## 2015-07-15 DIAGNOSIS — I480 Paroxysmal atrial fibrillation: Secondary | ICD-10-CM

## 2015-07-15 NOTE — Telephone Encounter (Signed)
Scheduled DCCV for 3/22. Patient to arrive to [facility] between 8-8:30 a.m. Reviewed instructions with patient and left copy at front desk for him to pick up tomorrow when he comes in for pre procedure labs. Patient verbalized understanding and agreeable to plan.

## 2015-07-16 ENCOUNTER — Other Ambulatory Visit (INDEPENDENT_AMBULATORY_CARE_PROVIDER_SITE_OTHER): Payer: Medicare Other | Admitting: *Deleted

## 2015-07-16 DIAGNOSIS — Z01812 Encounter for preprocedural laboratory examination: Secondary | ICD-10-CM

## 2015-07-16 DIAGNOSIS — I480 Paroxysmal atrial fibrillation: Secondary | ICD-10-CM

## 2015-07-16 LAB — CBC WITH DIFFERENTIAL/PLATELET
BASOS PCT: 1 % (ref 0–1)
Basophils Absolute: 0.1 10*3/uL (ref 0.0–0.1)
EOS ABS: 0.1 10*3/uL (ref 0.0–0.7)
EOS PCT: 2 % (ref 0–5)
HCT: 45.9 % (ref 39.0–52.0)
Hemoglobin: 15.4 g/dL (ref 13.0–17.0)
Lymphocytes Relative: 21 % (ref 12–46)
Lymphs Abs: 1.3 10*3/uL (ref 0.7–4.0)
MCH: 29.9 pg (ref 26.0–34.0)
MCHC: 33.6 g/dL (ref 30.0–36.0)
MCV: 89.1 fL (ref 78.0–100.0)
MONO ABS: 0.6 10*3/uL (ref 0.1–1.0)
MONOS PCT: 10 % (ref 3–12)
MPV: 10.5 fL (ref 8.6–12.4)
Neutro Abs: 4 10*3/uL (ref 1.7–7.7)
Neutrophils Relative %: 66 % (ref 43–77)
Platelets: 328 10*3/uL (ref 150–400)
RBC: 5.15 MIL/uL (ref 4.22–5.81)
RDW: 14.8 % (ref 11.5–15.5)
WBC: 6 10*3/uL (ref 4.0–10.5)

## 2015-07-16 LAB — BASIC METABOLIC PANEL
BUN: 13 mg/dL (ref 7–25)
CALCIUM: 9.5 mg/dL (ref 8.6–10.3)
CO2: 25 mmol/L (ref 20–31)
CREATININE: 1.24 mg/dL — AB (ref 0.70–1.18)
Chloride: 104 mmol/L (ref 98–110)
GLUCOSE: 104 mg/dL — AB (ref 65–99)
Potassium: 4.6 mmol/L (ref 3.5–5.3)
Sodium: 141 mmol/L (ref 135–146)

## 2015-07-18 ENCOUNTER — Encounter (HOSPITAL_BASED_OUTPATIENT_CLINIC_OR_DEPARTMENT_OTHER): Payer: Medicare Other

## 2015-07-21 ENCOUNTER — Other Ambulatory Visit: Payer: Self-pay | Admitting: Cardiology

## 2015-07-22 ENCOUNTER — Ambulatory Visit (HOSPITAL_COMMUNITY): Payer: Medicare Other | Admitting: Certified Registered"

## 2015-07-22 ENCOUNTER — Encounter (HOSPITAL_COMMUNITY): Payer: Self-pay | Admitting: *Deleted

## 2015-07-22 ENCOUNTER — Ambulatory Visit (HOSPITAL_COMMUNITY)
Admission: RE | Admit: 2015-07-22 | Discharge: 2015-07-22 | Disposition: A | Discharge status: Home / Self Care | Payer: Medicare Other | Source: Ambulatory Visit | Attending: Cardiology | Admitting: Cardiology

## 2015-07-22 ENCOUNTER — Encounter (HOSPITAL_COMMUNITY)
Admission: RE | Disposition: A | Discharge status: Home / Self Care | Payer: Self-pay | Source: Ambulatory Visit | Attending: Cardiology

## 2015-07-22 DIAGNOSIS — E119 Type 2 diabetes mellitus without complications: Secondary | ICD-10-CM | POA: Insufficient documentation

## 2015-07-22 DIAGNOSIS — I4891 Unspecified atrial fibrillation: Secondary | ICD-10-CM | POA: Insufficient documentation

## 2015-07-22 DIAGNOSIS — I10 Essential (primary) hypertension: Secondary | ICD-10-CM | POA: Diagnosis not present

## 2015-07-22 DIAGNOSIS — I272 Other secondary pulmonary hypertension: Secondary | ICD-10-CM | POA: Diagnosis not present

## 2015-07-22 DIAGNOSIS — Z7984 Long term (current) use of oral hypoglycemic drugs: Secondary | ICD-10-CM | POA: Diagnosis not present

## 2015-07-22 DIAGNOSIS — E785 Hyperlipidemia, unspecified: Secondary | ICD-10-CM | POA: Diagnosis not present

## 2015-07-22 DIAGNOSIS — Z6835 Body mass index (BMI) 35.0-35.9, adult: Secondary | ICD-10-CM | POA: Insufficient documentation

## 2015-07-22 DIAGNOSIS — E669 Obesity, unspecified: Secondary | ICD-10-CM | POA: Diagnosis not present

## 2015-07-22 DIAGNOSIS — J45909 Unspecified asthma, uncomplicated: Secondary | ICD-10-CM | POA: Insufficient documentation

## 2015-07-22 DIAGNOSIS — Z7982 Long term (current) use of aspirin: Secondary | ICD-10-CM | POA: Diagnosis not present

## 2015-07-22 DIAGNOSIS — Z7901 Long term (current) use of anticoagulants: Secondary | ICD-10-CM | POA: Insufficient documentation

## 2015-07-22 DIAGNOSIS — Z79899 Other long term (current) drug therapy: Secondary | ICD-10-CM | POA: Diagnosis not present

## 2015-07-22 HISTORY — PX: CARDIOVERSION: SHX1299

## 2015-07-22 LAB — GLUCOSE, CAPILLARY: Glucose-Capillary: 102 mg/dL — ABNORMAL HIGH (ref 65–99)

## 2015-07-22 SURGERY — CARDIOVERSION
Anesthesia: Monitor Anesthesia Care

## 2015-07-22 MED ORDER — SODIUM CHLORIDE 0.9 % IV SOLN
INTRAVENOUS | Status: DC
  Administered 2015-07-22: 500 mL via INTRAVENOUS

## 2015-07-22 MED ORDER — LACTATED RINGERS IV SOLN: INTRAVENOUS | Status: DC

## 2015-07-22 MED ORDER — PROPOFOL 10 MG/ML IV BOLUS
INTRAVENOUS | Status: DC | PRN
  Administered 2015-07-22: 65 mg via INTRAVENOUS

## 2015-07-22 MED ORDER — LIDOCAINE HCL (CARDIAC) 20 MG/ML IV SOLN
INTRAVENOUS | Status: DC | PRN
  Administered 2015-07-22: 50 mg via INTRAVENOUS

## 2015-07-22 NOTE — Discharge Instructions (Signed)
Electrical Cardioversion, Care After °Refer to this sheet in the next few weeks. These instructions provide you with information on caring for yourself after your procedure. Your health care provider may also give you more specific instructions. Your treatment has been planned according to current medical practices, but problems sometimes occur. Call your health care provider if you have any problems or questions after your procedure. °WHAT TO EXPECT AFTER THE PROCEDURE °After your procedure, it is typical to have the following sensations: °· Some redness on the skin where the shocks were delivered. If this is tender, a sunburn lotion or hydrocortisone cream may help. °· Possible return of an abnormal heart rhythm within hours or days after the procedure. °HOME CARE INSTRUCTIONS °· Take medicines only as directed by your health care provider. Be sure you understand how and when to take your medicine. °· Learn how to feel your pulse and check it often. °· Limit your activity for 48 hours after the procedure or as directed by your health care provider. °· Avoid or minimize caffeine and other stimulants as directed by your health care provider. °SEEK MEDICAL CARE IF: °· You feel like your heart is beating too fast or your pulse is not regular. °· You have any questions about your medicines. °· You have bleeding that will not stop. °SEEK IMMEDIATE MEDICAL CARE IF: °· You are dizzy or feel faint. °· It is hard to breathe or you feel short of breath. °· There is a change in discomfort in your chest. °· Your speech is slurred or you have trouble moving an arm or leg on one side of your body. °· You get a serious muscle cramp that does not go away. °· Your fingers or toes turn cold or blue. °  °This information is not intended to replace advice given to you by your health care provider. Make sure you discuss any questions you have with your health care provider. °  °Document Released: 02/06/2013 Document Revised: 05/09/2014  Document Reviewed: 02/06/2013 °Elsevier Interactive Patient Education ©2016 [employer] ° °

## 2015-07-22 NOTE — Anesthesia Postprocedure Evaluation (Signed)
Anesthesia Post Note  Patient: Dayceon Ratkovich Joshi  Procedure(s) Performed: Procedure(s) (LRB): CARDIOVERSION (N/A)  Patient location during evaluation: PACU Anesthesia Type: General Level of consciousness: awake and alert Pain management: pain level controlled Vital Signs Assessment: post-procedure vital signs reviewed and stable Respiratory status: spontaneous breathing, nonlabored ventilation, respiratory function stable and patient connected to nasal cannula oxygen Cardiovascular status: blood pressure returned to baseline and stable Postop Assessment: no signs of nausea or vomiting Anesthetic complications: no    Last Vitals:  Filed Vitals:   07/22/15 0806 07/22/15 0931  BP: 150/82 141/76  Pulse:  61  Temp:  36.8 C  Resp:  21    Last Pain: There were no vitals filed for this visit.               Effie Berkshire

## 2015-07-22 NOTE — Anesthesia Preprocedure Evaluation (Addendum)
Anesthesia Evaluation  Patient identified by MRN, date of birth, ID band Patient awake    Reviewed: Allergy & Precautions, NPO status , Patient's Chart, lab work & pertinent test results  Airway Mallampati: III  TM Distance: >3 FB Neck ROM: Full    Dental  (+) Teeth Intact   Pulmonary asthma ,    breath sounds clear to auscultation       Cardiovascular hypertension,  Rhythm:Regular Rate:Normal     Neuro/Psych  Neuromuscular disease negative psych ROS   GI/Hepatic negative GI ROS, Neg liver ROS,   Endo/Other  diabetes, Type 2, Oral Hypoglycemic Agents  Renal/GU Renal disease  negative genitourinary   Musculoskeletal  (+) Arthritis ,   Abdominal   Peds negative pediatric ROS (+)  Hematology negative hematology ROS (+)   Anesthesia Other Findings   Reproductive/Obstetrics negative OB ROS                            Lab Results  Component Value Date   WBC 6.0 07/16/2015   HGB 15.4 07/16/2015   HCT 45.9 07/16/2015   MCV 89.1 07/16/2015   PLT 328 07/16/2015     Anesthesia Physical Anesthesia Plan  ASA: III  Anesthesia Plan: MAC   Post-op Pain Management:    Induction: Intravenous  Airway Management Planned: Simple Face Mask and Natural Airway  Additional Equipment:   Intra-op Plan:   Post-operative Plan:   Informed Consent: I have reviewed the patients History and Physical, chart, labs and discussed the procedure including the risks, benefits and alternatives for the proposed anesthesia with the patient or authorized representative who has indicated his/her understanding and acceptance.   Dental advisory given  Plan Discussed with: CRNA  Anesthesia Plan Comments:         Anesthesia Quick Evaluation

## 2015-07-22 NOTE — Transfer of Care (Signed)
Immediate Anesthesia Transfer of Care Note  Patient: Mark Singh  Procedure(s) Performed: Procedure(s): CARDIOVERSION (N/A)  Patient Location: Endoscopy Unit  Anesthesia Type:General  Level of Consciousness: awake, oriented and patient cooperative  Airway & Oxygen Therapy: Patient Spontanous Breathing and Patient connected to nasal cannula oxygen  Post-op Assessment: Report given to RN, Post -op Vital signs reviewed and stable and Patient moving all extremities  Post vital signs: Reviewed and stable  Last Vitals:  Filed Vitals:   07/22/15 0804 07/22/15 0806  BP:  150/82  Temp: 36.6 C   Resp: 20     Complications: No apparent anesthesia complications

## 2015-07-22 NOTE — H&P (View-Only) (Signed)
Electrophysiology Office Note   Date:  06/30/2015   ID:  Mark Singh, DOB 1945-08-20, MRN [id number]  PCP:  [name], MD  Cardiologist:  Marlou Porch Primary Electrophysiologist:  Chestine Belknap Meredith Leeds, MD    Chief Complaint  Patient presents with  . Follow-up  . Atrial Fibrillation     History of Present Illness: Mark Singh is a 70 y.o. male who presents today for electrophysiology evaluation.   history of PVC's, bradycardia, nephrotic syndrome (complete responder to steroid therapy by Dr. Lorrene Reid), DM, HTN, HLD, obesity, orthostatic hypotension, and mild pulm HTN felt 2/2 obesity Comes in today now s/p ablation procedure for his PVC's.  His PVCs were mapped to inferior to the [facility] and ablated.    Today, he denies symptoms of palpitations, chest pain, shortness of breath, orthopnea, PND, lower extremity edema, claudication, dizziness, presyncope, syncope, bleeding, or neurologic sequela. The patient is tolerating medications without difficulties and is otherwise without complaint today.  His EKG today is consistent with atrial fibrillation. He did not know that he had atrial fibrillation in the past. He has not been anticoagulated. He does not have palpitations, shortness of breath, fatigue or weakness. He was seen by a nutritionist a few weeks ago who did an EKG and told him that he should see a cardiologist.   Past Medical History  Diagnosis Date  . Penile neoplasm   . Hyperlipidemia   . Type 2 diabetes mellitus ([place])   . H/O: gout     right foot  . Arthritis   . Seasonal asthma     mild  . Asthma   . HTN (hypertension)   . Orthostatic hypotension   . Myalgia and myositis, unspecified   . Edema     a. 2D echo 11/2013: EF 0000000, LV diastolic function parameters were normal, severely dilated LA, PASP 37mmHg..  . Nephrotic syndrome     a. responder to steroid therapy by Dr. Lorrene Reid.   . Back pain   . Shoulder pain   . PVC's (premature ventricular  contractions)     a. Holter 05/2013 showing 18k PVCs (18% of the time).  . Bradycardia     a. Repoted h/o HR in the 40s.  . Obesity   . Pulmonary hypertension ([place])     a. Mild pulm HTN felt 2/2 obesity.   Past Surgical History  Procedure Laterality Date  . Umbilical hernia repair  11-04-2005  . Left foot surgery  1992  &  2008    REMOVAL HEAL SPUR/ BUNIONECTOMY AND HAMMERTOE REPAIR X2  . Circumcision N/A 11/27/2012    Procedure:  CIRCUMCISION  AND EXCISION OF GLANS PENIS;  Surgeon: Fredricka Bonine, MD;  Location: [facility];  Service: Urology;  Laterality: N/A;  . Hammer toe surgery    . Electrophysiologic study N/A 03/30/2015    Procedure: PVC Ablation;  Surgeon: Sevon Rotert Meredith Leeds, MD;  Location: [facility];  Service: Cardiovascular;  Laterality: N/A;     Current Outpatient Prescriptions  Medication Sig Dispense Refill  . aspirin 81 MG tablet Take 1 tablet (81 mg total) by mouth daily. 30 tablet 0  . Cholecalciferol (VITAMIN D3) 5000 units CAPS Take 1 capsule by mouth daily.    . Fluticasone-Salmeterol (ADVAIR DISKUS) 100-50 MCG/DOSE AEPB Inhale 1 puff into the lungs 2 (two) times daily as needed (short of breath).     . metFORMIN (GLUCOPHAGE) 500 MG tablet Take 500 mg by mouth 2 (two) times daily with  a meal.    . nystatin (MYCOSTATIN) powder Apply topically as directed.     . pimecrolimus (ELIDEL) 1 % cream Apply topically as directed.     . probenecid (BENEMID) 500 MG tablet Take 500 mg by mouth 2 (two) times daily.     . simvastatin (ZOCOR) 40 MG tablet Take 40 mg by mouth daily.    . vitamin B-12 (CYANOCOBALAMIN) 1000 MCG tablet Take 1,000 mcg by mouth daily.     No current facility-administered medications for this visit.    Allergies:   Indomethacin   Social History:  The patient  reports that he has never smoked. He has never used smokeless tobacco. He reports that he does not drink alcohol or use illicit drugs.   Family History:  The  patient's family history includes CVA in his father; Coronary artery disease in his mother.    ROS:  Please see the history of present illness.   Otherwise, review of systems is positive for none.   All other systems are reviewed and negative.    PHYSICAL EXAM: VS:  BP 124/82 mmHg  Pulse 83  Ht 5\' 9"  (1.753 m)  Wt 255 lb 9.6 oz (115.939 kg)  BMI 37.73 kg/m2 , BMI Body mass index is 37.73 kg/(m^2). GEN: Well nourished, well developed, in no acute distress HEENT: normal Neck: no JVD, carotid bruits, or masses Cardiac: irregular; no murmurs, rubs, or gallops,no edema  Respiratory:  clear to auscultation bilaterally, normal work of breathing GI: soft, nontender, nondistended, + BS MS: no deformity or atrophy Skin: warm and dry Neuro:  Strength and sensation are intact Psych: euthymic mood, full affect  EKG:  EKG is ordered today. The ekg ordered today shows atrial fibrillation  Recent Labs: 02/03/2015: Magnesium 1.9; TSH 3.091 03/23/2015: BUN 14; Creat 1.26*; Hemoglobin 15.9; Platelets 318; Potassium 4.6; Sodium 139    Lipid Panel  No results found for: CHOL, TRIG, HDL, CHOLHDL, VLDL, LDLCALC, LDLDIRECT   Wt Readings from Last 3 Encounters:  06/30/15 255 lb 9.6 oz (115.939 kg)  04/29/15 256 lb 3.2 oz (116.212 kg)  03/30/15 246 lb (111.585 kg)      Other studies Reviewed: Additional studies/ records that were reviewed today include: TTE 12/28/14  Review of the above records today demonstrates:  - Left ventricle: The cavity size was normal. There was mild concentric hypertrophy. Systolic function was normal. The estimated ejection fraction was in the range of 55% to 60%. Although no diagnostic regional wall motion abnormality was identified, this possibility cannot be completely excluded on the basis of this study. Left ventricular diastolic function parameters were normal. - Left atrium: The atrium was severely dilated. - Pulmonary arteries: Systolic pressure  was mildly increased. PA peak pressure: 38 mm Hg (S).   ASSESSMENT AND PLAN:  1.  PVCs: had ablation done in November with good result.    2. Atrial fibrillation: Currently with no symptoms of atrial fibrillation. He does say that his heart rate has been up for the last few weeks though. Due to his new onset atrial fibrillation, we Contrell Ballentine put him on Xarelto for anticoagulation. He has not had many symptoms, but in speaking with him, a trial of sinus rhythm would be good. Unfortunately his seems that he has been in atrial fibrillation for the past few weeks, and we would need a 3 week time span before cardioversion would be safe. Post cardioversion, we Amelio Brosky get an EKG to determine if he is still in sinus rhythm. If he  is not we would consider drug therapy at that time if he is having any symptoms. If he continues to be asymptomatic he may benefit from rate control only. He is currently not on any beta blockers, but is rate controlled, and we Andon Villard therefore not start him on any new medications. We've also given him reading materials on atrial fibrillation.  This patients CHA2DS2-VASc Score and unadjusted Ischemic Stroke Rate (% per year) is equal to 3.2 % stroke rate/year from a score of 3  Above score calculated as 1 point each if present [CHF, HTN, DM, Vascular=MI/PAD/Aortic Plaque, Age if 65-74, or Male] Above score calculated as 2 points each if present [Age > 75, or Stroke/TIA/TE]    Current medicines are reviewed at length with the patient today.   The patient does not have concerns regarding his medicines.  The following changes were made today:  Xarelto  Labs/ tests ordered today include:  No orders of the defined types were placed in this encounter.     Disposition:   FU with Brentin Shin 3 months  Signed, Velvie Thomaston Meredith Leeds, MD  06/30/2015 9:51 AM     [facility] [address] [place] [address] [phone] (office) [phone] (fax)

## 2015-07-22 NOTE — Interval H&P Note (Signed)
History and Physical Interval Note:  07/22/2015 8:27 AM  Mark Singh  has presented today for surgery, with the diagnosis of AFIB  The various methods of treatment have been discussed with the patient and family. After consideration of risks, benefits and other options for treatment, the patient has consented to  Procedure(s): CARDIOVERSION (N/A) as a surgical intervention .  The patient's history has been reviewed, patient examined, no change in status, stable for surgery.  I have reviewed the patient's chart and labs.  Questions were answered to the patient's satisfaction.     Dorothy Spark

## 2015-07-22 NOTE — Anesthesia Procedure Notes (Signed)
Procedure Name: MAC Date/Time: 07/22/2015 9:21 AM Performed by: Melina Copa, Jaxx Huish R Pre-anesthesia Checklist: Patient identified, Emergency Drugs available, Suction available, Patient being monitored and Timeout performed Patient Re-evaluated:Patient Re-evaluated prior to inductionOxygen Delivery Method: Ambu bag Preoxygenation: Pre-oxygenation with 100% oxygen Intubation Type: IV induction Ventilation: Mask ventilation throughout procedure Placement Confirmation: positive ETCO2 Dental Injury: Teeth and Oropharynx as per pre-operative assessment

## 2015-07-22 NOTE — CV Procedure (Signed)
   Cardioversion Note  Mark Singh [id number] 07/27/1945  Procedure: DC Cardioversion Indications: atrial fibrillation  Procedure Details Consent: Obtained Time Out: Verified patient identification, verified procedure, site/side was marked, verified correct patient position, special equipment/implants available, Radiology Safety Procedures followed,  medications/allergies/relevent history reviewed, required imaging and test results available.  Performed  The patient has been on adequate anticoagulation.  The patient received IV propofol administered by anesthesia staff for sedation.  Synchronous cardioversion was performed at 120 joules.  The cardioversion was successful.   Complications: No apparent complications Patient did tolerate procedure well.   Dorothy Spark, MD, [facility] 07/22/2015, 9:35 AM

## 2015-07-23 ENCOUNTER — Encounter (HOSPITAL_COMMUNITY): Payer: Self-pay | Admitting: Cardiology

## 2015-07-29 ENCOUNTER — Ambulatory Visit: Payer: Medicare Other | Admitting: Podiatry

## 2015-07-29 ENCOUNTER — Encounter (HOSPITAL_COMMUNITY): Payer: Medicare Other | Admitting: Nurse Practitioner

## 2015-07-29 ENCOUNTER — Encounter (HOSPITAL_COMMUNITY): Payer: Self-pay | Admitting: Nurse Practitioner

## 2015-07-29 ENCOUNTER — Ambulatory Visit (HOSPITAL_COMMUNITY)
Admission: RE | Admit: 2015-07-29 | Discharge: 2015-07-29 | Disposition: A | Discharge status: Home / Self Care | Payer: Medicare Other | Source: Ambulatory Visit | Attending: Nurse Practitioner | Admitting: Nurse Practitioner

## 2015-07-29 VITALS — BP 130/80 | HR 51 | Ht 69.5 in | Wt 250.4 lb

## 2015-07-29 DIAGNOSIS — I10 Essential (primary) hypertension: Secondary | ICD-10-CM | POA: Diagnosis not present

## 2015-07-29 DIAGNOSIS — Z8249 Family history of ischemic heart disease and other diseases of the circulatory system: Secondary | ICD-10-CM | POA: Diagnosis not present

## 2015-07-29 DIAGNOSIS — Z7901 Long term (current) use of anticoagulants: Secondary | ICD-10-CM | POA: Insufficient documentation

## 2015-07-29 DIAGNOSIS — I4819 Other persistent atrial fibrillation: Secondary | ICD-10-CM

## 2015-07-29 DIAGNOSIS — Z888 Allergy status to other drugs, medicaments and biological substances status: Secondary | ICD-10-CM | POA: Insufficient documentation

## 2015-07-29 DIAGNOSIS — E119 Type 2 diabetes mellitus without complications: Secondary | ICD-10-CM | POA: Insufficient documentation

## 2015-07-29 DIAGNOSIS — I481 Persistent atrial fibrillation: Secondary | ICD-10-CM | POA: Diagnosis not present

## 2015-07-29 DIAGNOSIS — I272 Other secondary pulmonary hypertension: Secondary | ICD-10-CM | POA: Diagnosis not present

## 2015-07-29 DIAGNOSIS — I4891 Unspecified atrial fibrillation: Secondary | ICD-10-CM | POA: Diagnosis present

## 2015-07-29 DIAGNOSIS — E785 Hyperlipidemia, unspecified: Secondary | ICD-10-CM | POA: Diagnosis not present

## 2015-07-29 DIAGNOSIS — E669 Obesity, unspecified: Secondary | ICD-10-CM | POA: Insufficient documentation

## 2015-07-29 DIAGNOSIS — Z6836 Body mass index (BMI) 36.0-36.9, adult: Secondary | ICD-10-CM | POA: Diagnosis not present

## 2015-07-29 DIAGNOSIS — Z79899 Other long term (current) drug therapy: Secondary | ICD-10-CM | POA: Diagnosis not present

## 2015-07-29 DIAGNOSIS — J45909 Unspecified asthma, uncomplicated: Secondary | ICD-10-CM | POA: Diagnosis not present

## 2015-07-29 DIAGNOSIS — Z7984 Long term (current) use of oral hypoglycemic drugs: Secondary | ICD-10-CM | POA: Insufficient documentation

## 2015-07-29 DIAGNOSIS — Z823 Family history of stroke: Secondary | ICD-10-CM | POA: Insufficient documentation

## 2015-07-29 NOTE — Progress Notes (Signed)
Patient ID: Mark Singh, male   DOB: 10/04/1945, 70 y.o.   MRN: [id number]     Primary Care Physician: [name], MD Referring Physician: Dr. Norlene Campbell Teas is a 70 y.o. male with a h/o s/p PVC ablation with recent onset afib. He was set up for cardioversion and had f/u today. He did have successful cardioversion and remains in SR today.  He does not notice any difference in SR than when in  Afib. Compliant with eliquis.  Today, he denies symptoms of palpitations, chest pain, shortness of breath, orthopnea, PND, lower extremity edema, dizziness, presyncope, syncope, or neurologic sequela. The patient is tolerating medications without difficulties and is otherwise without complaint today.   Past Medical History  Diagnosis Date  . Penile neoplasm   . Hyperlipidemia   . Type 2 diabetes mellitus ([place])   . H/O: gout     right foot  . Arthritis   . Seasonal asthma     mild  . Asthma   . HTN (hypertension)   . Orthostatic hypotension   . Myalgia and myositis, unspecified   . Edema     a. 2D echo 11/2013: EF 0000000, LV diastolic function parameters were normal, severely dilated LA, PASP 92mmHg..  . Nephrotic syndrome     a. responder to steroid therapy by Dr. Lorrene Reid.   . Back pain   . Shoulder pain   . PVC's (premature ventricular contractions)     a. Holter 05/2013 showing 18k PVCs (18% of the time).  . Bradycardia     a. Repoted h/o HR in the 40s.  . Obesity   . Pulmonary hypertension ([place])     a. Mild pulm HTN felt 2/2 obesity.   Past Surgical History  Procedure Laterality Date  . Umbilical hernia repair  11-04-2005  . Left foot surgery  1992  &  2008    REMOVAL HEAL SPUR/ BUNIONECTOMY AND HAMMERTOE REPAIR X2  . Circumcision N/A 11/27/2012    Procedure:  CIRCUMCISION  AND EXCISION OF GLANS PENIS;  Surgeon: Fredricka Bonine, MD;  Location: [facility];  Service: Urology;  Laterality: N/A;  . Hammer toe surgery    .  Electrophysiologic study N/A 03/30/2015    Procedure: PVC Ablation;  Surgeon: [name], MD;  Location: [place] [facility];  Service: Cardiovascular;  Laterality: N/A;  . Cardioversion N/A 07/22/2015    Procedure: CARDIOVERSION;  Surgeon: Dorothy Spark, MD;  Location: [facility];  Service: Cardiovascular;  Laterality: N/A;    Current Outpatient Prescriptions  Medication Sig Dispense Refill  . Cholecalciferol (VITAMIN D3) 5000 units CAPS Take 1 capsule by mouth daily.    . Fluticasone-Salmeterol (ADVAIR DISKUS) 100-50 MCG/DOSE AEPB Inhale 1 puff into the lungs 2 (two) times daily as needed (short of breath).     . metFORMIN (GLUCOPHAGE) 500 MG tablet Take 500 mg by mouth 2 (two) times daily with a meal.    . nystatin (MYCOSTATIN) powder Apply topically as directed.     . pimecrolimus (ELIDEL) 1 % cream Apply topically as directed.     . probenecid (BENEMID) 500 MG tablet Take 500 mg by mouth 2 (two) times daily.     . rivaroxaban (XARELTO) 20 MG TABS tablet Take 1 tablet (20 mg total) by mouth daily with supper. 30 tablet 0  . simvastatin (ZOCOR) 40 MG tablet Take 40 mg by mouth daily.    . vitamin B-12 (CYANOCOBALAMIN) 1000 MCG tablet Take 1,000  mcg by mouth daily.     No current facility-administered medications for this encounter.    Allergies  Allergen Reactions  . Indomethacin Other (See Comments)    dizziness    Social History   Social History  . Marital Status: Married    Spouse Name: N/A  . Number of Children: N/A  . Years of Education: N/A   Occupational History  . Not on file.   Social History Main Topics  . Smoking status: Never Smoker   . Smokeless tobacco: Never Used  . Alcohol Use: No  . Drug Use: No  . Sexual Activity: No   Other Topics Concern  . Not on file   Social History Narrative    Family History  Problem Relation Age of Onset  . Coronary artery disease Mother   . CVA Father     ROS- All systems are reviewed and negative  except as per the HPI above  Physical Exam: Filed Vitals:   07/29/15 0948 07/29/15 1118  BP: 142/80 130/80  Pulse: 51   Height: 5' 9.5" (1.765 m)   Weight: 250 lb 6.4 oz (113.581 kg)     GEN- The patient is well appearing, alert and oriented x 3 today.   Head- normocephalic, atraumatic Eyes-  Sclera clear, conjunctiva pink Ears- hearing intact Oropharynx- clear Neck- supple, no JVP Lymph- no cervical lymphadenopathy Lungs- Clear to ausculation bilaterally, normal work of breathing Heart- Regular rate and rhythm, no murmurs, rubs or gallops, PMI not laterally displaced GI- soft, NT, ND, + BS Extremities- no clubbing, cyanosis, or edema MS- no significant deformity or atrophy Skin- no rash or lesion Psych- euthymic mood, full affect Neuro- strength and sensation are intact  EKG- Sinus brady at 51 bpm, pr int 196 sec, qrs int 88 ms, qtc 392 ms Epic records reviewed  Assessment and Plan: 1. Persistent  minimally symptomatic afib S/P successful cardioversion Does not feel that different in SR than afib Continue xarelto  Was not on rate control and has asymptomatic brady in SR  2. Lifestyle measures Discussed regular exercise and weight loss  F/u with [name]  5/30

## 2015-08-02 ENCOUNTER — Other Ambulatory Visit: Payer: Self-pay | Admitting: Cardiology

## 2015-08-17 ENCOUNTER — Ambulatory Visit (INDEPENDENT_AMBULATORY_CARE_PROVIDER_SITE_OTHER): Payer: Medicare Other | Admitting: Cardiology

## 2015-08-17 ENCOUNTER — Encounter: Payer: Self-pay | Admitting: Cardiology

## 2015-08-17 VITALS — BP 128/84 | HR 58 | Ht 69.0 in | Wt 245.6 lb

## 2015-08-17 DIAGNOSIS — I493 Ventricular premature depolarization: Secondary | ICD-10-CM | POA: Diagnosis not present

## 2015-08-17 DIAGNOSIS — I480 Paroxysmal atrial fibrillation: Secondary | ICD-10-CM | POA: Diagnosis not present

## 2015-08-17 DIAGNOSIS — E669 Obesity, unspecified: Secondary | ICD-10-CM | POA: Diagnosis not present

## 2015-08-17 DIAGNOSIS — E785 Hyperlipidemia, unspecified: Secondary | ICD-10-CM | POA: Diagnosis not present

## 2015-08-17 MED ORDER — RIVAROXABAN 20 MG PO TABS: 20.0000 mg | ORAL_TABLET | Freq: Every day | ORAL | Status: DC

## 2015-08-17 MED ORDER — METOPROLOL SUCCINATE ER 25 MG PO TB24: 25.0000 mg | ORAL_TABLET | Freq: Every day | ORAL | Status: DC

## 2015-08-17 NOTE — Patient Instructions (Signed)
Medication Instructions:  Please start Metoprolol succinate 25 mg a day.  Please call if your heart rate increases and stays above 110 bpm. Continue all other medications as listed.  Follow-Up: Follow up with Dr Marlou Porch in 3 months.  If you need a refill on your cardiac medications before your next appointment, please call your pharmacy.  Thank you for choosing [facility]!!

## 2015-08-17 NOTE — Progress Notes (Signed)
Mark Singh. [address], [address], [place]  [address] Phone: [phone] Fax:  [phone]  Date:  08/17/2015   ID:  Mark Singh, DOB 23-Jul-1945, MRN [id number]  PCP:  [name], MD   History of Present Illness: Mark Singh is a 70 y.o. male with history of PVC ablation with recent onset paroxysmal atrial fibrillation with successful cardioversion in March 2017 followed by [name]. In review of his note, he did not notice any difference in sinus rhythm when compared to fibrillation. He has been compliant with Eliquis. No palpitations, no chest pain.  Prior nephrotic syndrome a complete responder to steroid therapy by Dr. Lorrene Reid, diabetes, hypertension. His prior echocardiogram showed a normal EF with diastolic dysfunction and dilated left atrium. He no longer complains of any excessive dyspnea or chest burning/tightness. Prior nuclear stress test was low risk.He still continues to battle with obesity and is eager to lose weight. He continues to take his cholesterol medication. He is normotensive with blood pressures running in the 120s to 130s at all doctors visits.  In 2014 had skin cancer surgery. Tired all of the time at that point. HR at that point was in the 40's, during colonoscopy. ECG showed PVC's frequent. Personally viewed EKG which shows sinus rhythm with ventricular trigeminy. Upright in aVF, upright in V3 through V6, down in aVR.  Previously, Frequent PVCs 18000 were noted on Holter monitor. We started metoprolol extended release 25 mg once a day on 05/13/13. He then underwent the PVC ablation procedure. Atrial fibrillation was then noted as above. Cardioversion successful. No change in symptoms. Last Friday however while working on the treadmill, his heart rate monitor showed a fairly high heart rate and this was what made him think that he went back into atrial fibrillation. He was correct. He did not necessarily feel any change in his  symptoms. He did state that he sweat more on the treadmill in usual. See below for details.  Previously, He does not feel his PVCs. Echocardiogram was reassuring. He does have mild pulmonary hypertension which is secondary from obesity. Prior thyroid evaluation was unremarkable. No evidence of hyperthyroidism.  Wt Readings from Last 3 Encounters:  08/17/15 245 lb 9.6 oz (111.403 kg)  07/29/15 250 lb 6.4 oz (113.581 kg)  07/22/15 245 lb (111.131 kg)     Past Medical History  Diagnosis Date  . Penile neoplasm   . Hyperlipidemia   . Type 2 diabetes mellitus ([place])   . H/O: gout     right foot  . Arthritis   . Seasonal asthma     mild  . Asthma   . HTN (hypertension)   . Orthostatic hypotension   . Myalgia and myositis, unspecified   . Edema     a. 2D echo 11/2013: EF 0000000, LV diastolic function parameters were normal, severely dilated LA, PASP 13mmHg..  . Nephrotic syndrome     a. responder to steroid therapy by Dr. Lorrene Reid.   . Back pain   . Shoulder pain   . PVC's (premature ventricular contractions)     a. Holter 05/2013 showing 18k PVCs (18% of the time).  . Bradycardia     a. Repoted h/o HR in the 40s.  . Obesity   . Pulmonary hypertension ([place])     a. Mild pulm HTN felt 2/2 obesity.    Past Surgical History  Procedure Laterality Date  . Umbilical hernia repair  11-04-2005  . Left foot  surgery  1992  &  2008    REMOVAL HEAL SPUR/ BUNIONECTOMY AND HAMMERTOE REPAIR X2  . Circumcision N/A 11/27/2012    Procedure:  CIRCUMCISION  AND EXCISION OF GLANS PENIS;  Surgeon: Fredricka Bonine, MD;  Location: [facility];  Service: Urology;  Laterality: N/A;  . Hammer toe surgery    . Electrophysiologic study N/A 03/30/2015    Procedure: PVC Ablation;  Surgeon: [name], MD;  Location: [facility];  Service: Cardiovascular;  Laterality: N/A;  . Cardioversion N/A 07/22/2015    Procedure: CARDIOVERSION;  Surgeon: Dorothy Spark, MD;   Location: [facility]OS[facility];  Service: Cardiovascular;  Laterality: N/A;    Current Outpatient Prescriptions  Medication Sig Dispense Refill  . Cholecalciferol (VITAMIN D3) 5000 units CAPS Take 1 capsule by mouth daily.    . Fluticasone-Salmeterol (ADVAIR DISKUS) 100-50 MCG/DOSE AEPB Inhale 1 puff into the lungs 2 (two) times daily as needed (short of breath).     . metFORMIN (GLUCOPHAGE) 500 MG tablet Take 500 mg by mouth 2 (two) times daily with a meal.    . nystatin (MYCOSTATIN) powder Apply topically as directed.     . pimecrolimus (ELIDEL) 1 % cream Apply topically as directed.     . probenecid (BENEMID) 500 MG tablet Take 500 mg by mouth 2 (two) times daily.     . simvastatin (ZOCOR) 40 MG tablet Take 40 mg by mouth daily.    . vitamin B-12 (CYANOCOBALAMIN) 1000 MCG tablet Take 1,000 mcg by mouth daily.    Alveda Reasons 20 MG TABS tablet TAKE 1 TABLET (20 MG TOTAL) BY MOUTH DAILY WITH SUPPER. 30 tablet 0  . metoprolol succinate (TOPROL-XL) 25 MG 24 hr tablet Take 1 tablet (25 mg total) by mouth daily. 30 tablet 11   No current facility-administered medications for this visit.    Allergies:    Allergies  Allergen Reactions  . Indomethacin Other (See Comments)    dizziness    Social History:  The patient  reports that he has never smoked. He has never used smokeless tobacco. He reports that he does not drink alcohol or use illicit drugs.   ROS:  Please see the history of present illness.   Denies any syncope, stroke like symptoms, orthopnea, PND, chest pain. Occasionally he will feel some slight dizziness.  PHYSICAL EXAM: VS:  BP 128/84 mmHg  Pulse 58  Ht 5\' 9"  (1.753 m)  Wt 245 lb 9.6 oz (111.403 kg)  BMI 36.25 kg/m2 Well nourished, well developed, in no acute distress HEENT: normal Neck: no JVD Cardiac:  normal S1, S2; irreg irreg; no murmur  Lungs:  clear to auscultation bilaterally, no wheezing, rhonchi or rales Abd: soft, nontender, no hepatomegalyObese Ext: no edema Skin:  warm and dry Neuro: no focal abnormalities noted  EKG:  04/22/13 from Dr. Ammie Ferrier office:  ECG showed PVC's frequent. Personally viewed EKG which shows sinus rhythm with ventricular trigeminy. Upright in aVF, upright in V3 through V6, down in aVR. 12/25/13-sinus bradycardia underlying rate 50 with 3 PVCs noted on 12-lead-frequent. PVC isoelectric in lead 1, positive lead to, 3, aVF, negative in aVL, negative aVR, positive in V4 through 6.  Holter monitor 05/07/13-18,000 PVCs Echocardiogram 12/27/13-normal ejection fraction, mild LVH, pulmonary artery pressure estimated 38 mmHg. Labs: 05/07/13-TSH was normal. Electrolytes normal.   ASSESSMENT AND PLAN:  1. Paroxysmal  atrial fibrillation-status post successful cardioversion in March 2017 followed by [name]. Eliquis. No change in symptoms with atrial  fibrillation. Felt like went out last Friday. He is correct. Sweat more than usual on treadmill and heart rate had gone up more than normal. Other than that, he does not feel any symptoms with his fibrillation. He noted that his heart rate was increased on his heart rate monitor. Because of this, we will go ahead and restart Toprol-XL 25 mg once a day. He may need further titration upward. He will call me if this is the case. He has an appointment with [name] in May. 2. Chronic anticoagulation-continue with Xarelto. Prior creatinine 1.24, hemoglobin 15.4. 3. Frequent PVCs-previous PVC ablative therapy on 03/30/15. PVCs previously unifocal. No syncope. Echocardiogram reassuring with normal pump function. He states that in the summertime, he has "asthma" and some difficulty with shortness of breath when walking in a hot environment. He is no longer going to the [facility], trouble parking. 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 He is motivated to try to lose weight. Mild dizziness previously may have been associated with lower effective heart rate. 24-hour Holter monitor-previously  18,000 PVCs. Very frequent burden.  Prior nuclear stress test was also low risk. TSH was normal.  4. Obesity-encourage weight loss. Fit bit. Discussed decreasing carbohydrates. 5. Hypertension-currently reasonably controlled but on upper limit of normal.. Has Omron blood pressure cuff. 6. Hyperlipidemia-simvastatin. Continue. 7. We will see him back in 12 months.  Signed, [name], MD [facility]  08/17/2015 8:29 AM

## 2015-09-28 NOTE — Progress Notes (Signed)
Electrophysiology Office Note   Date:  09/29/2015   ID:  Mark Singh, DOB 1945/10/01, MRN [id number]  PCP:  [name], MD  Cardiologist:  Marlou Porch Primary Electrophysiologist:  Kishaun Erekson Meredith Leeds, MD    Chief Complaint  Patient presents with  . Follow-up  . persistant afib     History of Present Illness: Mark Singh is a 70 y.o. male who presents today for electrophysiology evaluation.   history of PVC's, bradycardia, nephrotic syndrome (complete responder to steroid therapy by Dr. Lorrene Reid), DM, HTN, HLD, obesity, orthostatic hypotension, and mild pulm HTN felt 2/2 obesity Comes in today now s/p ablation procedure for his PVC's.  His PVCs were mapped to inferior to the [facility] and ablated.    Today, he denies symptoms of palpitations, chest pain, shortness of breath, orthopnea, PND, lower extremity edema, claudication, dizziness, presyncope, syncope, bleeding, or neurologic sequela. The patient is tolerating medications without difficulties and is otherwise without complaint today.  He is back in atrial fibrillation currently. He is not aware of atrial fibrillation today. He says that he is able to exercise without any problems. He does say that his heart rate gets into the 130s when he is walking on a treadmill and exerting himself. Otherwise he has no symptoms of shortness of breath or palpitations. He does not complain of fatigue.   Past Medical History  Diagnosis Date  . Penile neoplasm   . Hyperlipidemia   . Type 2 diabetes mellitus ([place])   . H/O: gout     right foot  . Arthritis   . Seasonal asthma     mild  . Asthma   . HTN (hypertension)   . Orthostatic hypotension   . Myalgia and myositis, unspecified   . Edema     a. 2D echo 11/2013: EF 0000000, LV diastolic function parameters were normal, severely dilated LA, PASP 30mmHg..  . Nephrotic syndrome     a. responder to steroid therapy by Dr. Lorrene Reid.   . Back pain   . Shoulder pain   . PVC's  (premature ventricular contractions)     a. Holter 05/2013 showing 18k PVCs (18% of the time).  . Bradycardia     a. Repoted h/o HR in the 40s.  . Obesity   . Pulmonary hypertension ([place])     a. Mild pulm HTN felt 2/2 obesity.   Past Surgical History  Procedure Laterality Date  . Umbilical hernia repair  11-04-2005  . Left foot surgery  1992  &  2008    REMOVAL HEAL SPUR/ BUNIONECTOMY AND HAMMERTOE REPAIR X2  . Circumcision N/A 11/27/2012    Procedure:  CIRCUMCISION  AND EXCISION OF GLANS PENIS;  Surgeon: Fredricka Bonine, MD;  Location: [facility];  Service: Urology;  Laterality: N/A;  . Hammer toe surgery    . Electrophysiologic study N/A 03/30/2015    Procedure: PVC Ablation;  Surgeon: Madgeline Rayo Meredith Leeds, MD;  Location: [place] [facility];  Service: Cardiovascular;  Laterality: N/A;  . Cardioversion N/A 07/22/2015    Procedure: CARDIOVERSION;  Surgeon: Dorothy Spark, MD;  Location: [facility] ENDOSCOPY;  Service: Cardiovascular;  Laterality: N/A;     Current Outpatient Prescriptions  Medication Sig Dispense Refill  . Cholecalciferol (VITAMIN D3) 5000 units CAPS Take 1 capsule by mouth daily.    . Fluticasone-Salmeterol (ADVAIR DISKUS) 100-50 MCG/DOSE AEPB Inhale 1 puff into the lungs 2 (two) times daily as needed (short of breath).     . metFORMIN (GLUCOPHAGE)  500 MG tablet Take 500 mg by mouth 2 (two) times daily with a meal.    . metoprolol succinate (TOPROL-XL) 25 MG 24 hr tablet Take 1 tablet (25 mg total) by mouth daily. 30 tablet 11  . nystatin (MYCOSTATIN) powder Apply topically as directed.     . pimecrolimus (ELIDEL) 1 % cream Apply topically as directed.     . probenecid (BENEMID) 500 MG tablet Take 500 mg by mouth 2 (two) times daily.     . rivaroxaban (XARELTO) 20 MG TABS tablet Take 1 tablet (20 mg total) by mouth daily with supper. 30 tablet 11  . simvastatin (ZOCOR) 40 MG tablet Take 40 mg by mouth daily.    . vitamin B-12 (CYANOCOBALAMIN) 1000 MCG  tablet Take 1,000 mcg by mouth daily.     No current facility-administered medications for this visit.    Allergies:   Indomethacin   Social History:  The patient  reports that he has never smoked. He has never used smokeless tobacco. He reports that he does not drink alcohol or use illicit drugs.   Family History:  The patient's family history includes CVA in his father; Coronary artery disease in his mother.    ROS:  Please see the history of present illness.   Otherwise, review of systems is positive for none.   All other systems are reviewed and negative.    PHYSICAL EXAM: VS:  BP 120/100 mmHg  Pulse 74  Ht 5\' 9"  (1.753 m)  Wt 242 lb 12.8 oz (110.133 kg)  BMI 35.84 kg/m2 , BMI Body mass index is 35.84 kg/(m^2). GEN: Well nourished, well developed, in no acute distress HEENT: normal Neck: no JVD, carotid bruits, or masses Cardiac: irregular; no murmurs, rubs, or gallops,no edema  Respiratory:  clear to auscultation bilaterally, normal work of breathing GI: soft, nontender, nondistended, + BS MS: no deformity or atrophy Skin: warm and dry Neuro:  Strength and sensation are intact Psych: euthymic mood, full affect  EKG:  EKG is ordered today. The ekg ordered today shows atrial fibrillation  Recent Labs: 02/03/2015: Magnesium 1.9; TSH 3.091 07/16/2015: BUN 13; Creat 1.24*; Hemoglobin 15.4; Platelets 328; Potassium 4.6; Sodium 141    Lipid Panel  No results found for: CHOL, TRIG, HDL, CHOLHDL, VLDL, LDLCALC, LDLDIRECT   Wt Readings from Last 3 Encounters:  09/29/15 242 lb 12.8 oz (110.133 kg)  08/17/15 245 lb 9.6 oz (111.403 kg)  07/29/15 250 lb 6.4 oz (113.581 kg)      Other studies Reviewed: Additional studies/ records that were reviewed today include: TTE 12/28/14  Review of the above records today demonstrates:  - Left ventricle: The cavity size was normal. There was mild concentric hypertrophy. Systolic function was normal. The estimated ejection fraction  was in the range of 55% to 60%. Although no diagnostic regional wall motion abnormality was identified, this possibility cannot be completely excluded on the basis of this study. Left ventricular diastolic function parameters were normal. - Left atrium: The atrium was severely dilated. - Pulmonary arteries: Systolic pressure was mildly increased. PA peak pressure: 38 mm Hg (S).   ASSESSMENT AND PLAN:  1.  PVCs: had ablation done in November with good result.    2. Atrial fibrillation: Placed on Xarelto for anticoagulation. He has not had many symptoms, but in speaking with him, a trial of sinus rhythm would be good. He had cardioversion without return to sinus rhythm.  Caroll Weinheimer plan for him to have him start flecainide 75 mg BID  with repeat cardioversion in one week.    This patients CHA2DS2-VASc Score and unadjusted Ischemic Stroke Rate (% per year) is equal to 3.2 % stroke rate/year from a score of 3  Above score calculated as 1 point each if present [CHF, HTN, DM, Vascular=MI/PAD/Aortic Plaque, Age if 65-74, or Male] Above score calculated as 2 points each if present [Age > 75, or Stroke/TIA/TE]    Current medicines are reviewed at length with the patient today.   The patient does not have concerns regarding his medicines.  The following changes were made today:  Xarelto  Labs/ tests ordered today include:  No orders of the defined types were placed in this encounter.     Disposition:   FU with Dimonique Bourdeau 3 months  Signed, Jowan Skillin Meredith Leeds, MD  09/29/2015 11:26 AM     [facility] [address] [place] [address] [phone] (office) [phone] (fax)

## 2015-09-29 ENCOUNTER — Encounter: Payer: Self-pay | Admitting: Cardiology

## 2015-09-29 ENCOUNTER — Encounter: Payer: Self-pay | Admitting: *Deleted

## 2015-09-29 ENCOUNTER — Ambulatory Visit (INDEPENDENT_AMBULATORY_CARE_PROVIDER_SITE_OTHER): Payer: Medicare Other | Admitting: Cardiology

## 2015-09-29 VITALS — BP 120/100 | HR 74 | Ht 69.0 in | Wt 242.8 lb

## 2015-09-29 DIAGNOSIS — I481 Persistent atrial fibrillation: Secondary | ICD-10-CM | POA: Diagnosis not present

## 2015-09-29 DIAGNOSIS — Z01812 Encounter for preprocedural laboratory examination: Secondary | ICD-10-CM

## 2015-09-29 DIAGNOSIS — I4819 Other persistent atrial fibrillation: Secondary | ICD-10-CM

## 2015-09-29 LAB — CBC WITH DIFFERENTIAL/PLATELET
Basophils Absolute: 75 cells/uL (ref 0–200)
Basophils Relative: 1 %
Eosinophils Absolute: 150 cells/uL (ref 15–500)
Eosinophils Relative: 2 %
HEMATOCRIT: 46.2 % (ref 38.5–50.0)
Hemoglobin: 15.4 g/dL (ref 13.2–17.1)
LYMPHS PCT: 23 %
Lymphs Abs: 1725 cells/uL (ref 850–3900)
MCH: 30.3 pg (ref 27.0–33.0)
MCHC: 33.3 g/dL (ref 32.0–36.0)
MCV: 90.8 fL (ref 80.0–100.0)
MONO ABS: 750 {cells}/uL (ref 200–950)
MPV: 9.7 fL (ref 7.5–12.5)
Monocytes Relative: 10 %
NEUTROS PCT: 64 %
Neutro Abs: 4800 cells/uL (ref 1500–7800)
Platelets: 304 10*3/uL (ref 140–400)
RBC: 5.09 MIL/uL (ref 4.20–5.80)
RDW: 13.8 % (ref 11.0–15.0)
WBC: 7.5 10*3/uL (ref 3.8–10.8)

## 2015-09-29 LAB — BASIC METABOLIC PANEL
BUN: 15 mg/dL (ref 7–25)
CO2: 28 mmol/L (ref 20–31)
Calcium: 9.6 mg/dL (ref 8.6–10.3)
Chloride: 103 mmol/L (ref 98–110)
Creat: 1.4 mg/dL — ABNORMAL HIGH (ref 0.70–1.18)
GLUCOSE: 86 mg/dL (ref 65–99)
POTASSIUM: 4.6 mmol/L (ref 3.5–5.3)
Sodium: 140 mmol/L (ref 135–146)

## 2015-09-29 MED ORDER — FLECAINIDE ACETATE 150 MG PO TABS: 75.0000 mg | ORAL_TABLET | Freq: Two times a day (BID) | ORAL | Status: DC

## 2015-09-29 NOTE — Patient Instructions (Addendum)
Medication Instructions:  Your physician has recommended you make the following change in your medication:  1) START Flecainide 75 mg twice a day  Labwork: Pre procedure labs today: CBC & BMP  Testing/Procedures: Your physician has recommended that you have a Cardioversion (DCCV). Electrical Cardioversion uses a jolt of electricity to your heart either through paddles or wired patches attached to your chest. This is a controlled, usually prescheduled, procedure. Defibrillation is done under light anesthesia in the hospital, and you usually go home the day of the procedure. This is done to get your heart back into a normal rhythm. You are not awake for the procedure. Please see the instruction sheet given to you today.  Follow-Up: Your physician recommends that you schedule a follow-up appointment in: 1 week, after procedure on 10/06/15,  with Roderic Palau, NP.    If you need a refill on your cardiac medications before your next appointment, please call your pharmacy.  Thank you for choosing [facility]!!   [name], RN [phone]

## 2015-09-30 DIAGNOSIS — N481 Balanitis: Secondary | ICD-10-CM | POA: Insufficient documentation

## 2015-10-02 NOTE — Addendum Note (Signed)
Addended by: Freada Bergeron on: 10/02/2015 08:59 AM   Modules accepted: Orders

## 2015-10-06 ENCOUNTER — Ambulatory Visit (HOSPITAL_COMMUNITY): Payer: Medicare Other | Admitting: Anesthesiology

## 2015-10-06 ENCOUNTER — Ambulatory Visit (HOSPITAL_COMMUNITY)
Admission: RE | Admit: 2015-10-06 | Discharge: 2015-10-06 | Disposition: A | Discharge status: Home / Self Care | Payer: Medicare Other | Source: Ambulatory Visit | Attending: Cardiology | Admitting: Cardiology

## 2015-10-06 ENCOUNTER — Encounter (HOSPITAL_COMMUNITY): Payer: Self-pay | Admitting: *Deleted

## 2015-10-06 ENCOUNTER — Encounter (HOSPITAL_COMMUNITY)
Admission: RE | Disposition: A | Discharge status: Home / Self Care | Payer: Self-pay | Source: Ambulatory Visit | Attending: Cardiology

## 2015-10-06 DIAGNOSIS — Z79899 Other long term (current) drug therapy: Secondary | ICD-10-CM | POA: Insufficient documentation

## 2015-10-06 DIAGNOSIS — E785 Hyperlipidemia, unspecified: Secondary | ICD-10-CM | POA: Insufficient documentation

## 2015-10-06 DIAGNOSIS — J45909 Unspecified asthma, uncomplicated: Secondary | ICD-10-CM | POA: Diagnosis not present

## 2015-10-06 DIAGNOSIS — E119 Type 2 diabetes mellitus without complications: Secondary | ICD-10-CM | POA: Insufficient documentation

## 2015-10-06 DIAGNOSIS — E669 Obesity, unspecified: Secondary | ICD-10-CM | POA: Insufficient documentation

## 2015-10-06 DIAGNOSIS — Z7984 Long term (current) use of oral hypoglycemic drugs: Secondary | ICD-10-CM | POA: Insufficient documentation

## 2015-10-06 DIAGNOSIS — I10 Essential (primary) hypertension: Secondary | ICD-10-CM | POA: Diagnosis not present

## 2015-10-06 DIAGNOSIS — I481 Persistent atrial fibrillation: Secondary | ICD-10-CM

## 2015-10-06 DIAGNOSIS — Z7901 Long term (current) use of anticoagulants: Secondary | ICD-10-CM | POA: Insufficient documentation

## 2015-10-06 DIAGNOSIS — I4891 Unspecified atrial fibrillation: Secondary | ICD-10-CM | POA: Diagnosis present

## 2015-10-06 DIAGNOSIS — I4819 Other persistent atrial fibrillation: Secondary | ICD-10-CM | POA: Insufficient documentation

## 2015-10-06 HISTORY — PX: CARDIOVERSION: SHX1299

## 2015-10-06 LAB — GLUCOSE, CAPILLARY: GLUCOSE-CAPILLARY: 100 mg/dL — AB (ref 65–99)

## 2015-10-06 SURGERY — CARDIOVERSION
Anesthesia: Monitor Anesthesia Care

## 2015-10-06 MED ORDER — METOPROLOL SUCCINATE ER 25 MG PO TB24: 25.0000 mg | ORAL_TABLET | Freq: Every day | ORAL | Status: DC

## 2015-10-06 MED ORDER — SODIUM CHLORIDE 0.9 % IV SOLN
INTRAVENOUS | Status: DC
  Administered 2015-10-06: 500 mL via INTRAVENOUS

## 2015-10-06 MED ORDER — PROPOFOL 10 MG/ML IV BOLUS
INTRAVENOUS | Status: DC | PRN
  Administered 2015-10-06: 70 mg via INTRAVENOUS

## 2015-10-06 MED ORDER — LIDOCAINE HCL (CARDIAC) 20 MG/ML IV SOLN
INTRAVENOUS | Status: DC | PRN
  Administered 2015-10-06: 60 mg via INTRAVENOUS

## 2015-10-06 NOTE — Interval H&P Note (Signed)
History and Physical Interval Note:  10/06/2015 1:03 PM  Lendon Ka Scobey  has presented today for surgery, with the diagnosis of AFIB  The various methods of treatment have been discussed with the patient and family. After consideration of risks, benefits and other options for treatment, the patient has consented to  Procedure(s): CARDIOVERSION (N/A) as a surgical intervention .  The patient's history has been reviewed, patient examined, no change in status, stable for surgery.  I have reviewed the patient's chart and labs.  Questions were answered to the patient's satisfaction.     Fransico Him

## 2015-10-06 NOTE — Anesthesia Preprocedure Evaluation (Addendum)
Anesthesia Evaluation  Patient identified by MRN, date of birth, ID band Patient awake    Reviewed: Allergy & Precautions, NPO status , Patient's Chart, lab work & pertinent test results, reviewed documented beta blocker date and time   Airway Mallampati: II       Dental  (+) Teeth Intact   Pulmonary asthma ,    breath sounds clear to auscultation       Cardiovascular hypertension, + dysrhythmias Atrial Fibrillation  Rhythm:irregular Rate:Normal     Neuro/Psych    GI/Hepatic   Endo/Other  diabetes, Type 2, Oral Hypoglycemic Agents  Renal/GU Renal InsufficiencyRenal disease     Musculoskeletal   Abdominal   Peds  Hematology   Anesthesia Other Findings   Reproductive/Obstetrics                         Anesthesia Physical Anesthesia Plan  ASA: III  Anesthesia Plan: MAC   Post-op Pain Management:    Induction: Intravenous  Airway Management Planned: Mask  Additional Equipment:   Intra-op Plan:   Post-operative Plan:   Informed Consent: I have reviewed the patients History and Physical, chart, labs and discussed the procedure including the risks, benefits and alternatives for the proposed anesthesia with the patient or authorized representative who has indicated his/her understanding and acceptance.   Dental advisory given  Plan Discussed with: CRNA and Anesthesiologist  Anesthesia Plan Comments:         Anesthesia Quick Evaluation

## 2015-10-06 NOTE — Discharge Instructions (Signed)
Electrical Cardioversion, Care After °Refer to this sheet in the next few weeks. These instructions provide you with information on caring for yourself after your procedure. Your health care provider may also give you more specific instructions. Your treatment has been planned according to current medical practices, but problems sometimes occur. Call your health care provider if you have any problems or questions after your procedure. °WHAT TO EXPECT AFTER THE PROCEDURE °After your procedure, it is typical to have the following sensations: °· Some redness on the skin where the shocks were delivered. If this is tender, a sunburn lotion or hydrocortisone cream may help. °· Possible return of an abnormal heart rhythm within hours or days after the procedure. °HOME CARE INSTRUCTIONS °· Take medicines only as directed by your health care provider. Be sure you understand how and when to take your medicine. °· Learn how to feel your pulse and check it often. °· Limit your activity for 48 hours after the procedure or as directed by your health care provider. °· Avoid or minimize caffeine and other stimulants as directed by your health care provider. °SEEK MEDICAL CARE IF: °· You feel like your heart is beating too fast or your pulse is not regular. °· You have any questions about your medicines. °· You have bleeding that will not stop. °SEEK IMMEDIATE MEDICAL CARE IF: °· You are dizzy or feel faint. °· It is hard to breathe or you feel short of breath. °· There is a change in discomfort in your chest. °· Your speech is slurred or you have trouble moving an arm or leg on one side of your body. °· You get a serious muscle cramp that does not go away. °· Your fingers or toes turn cold or blue. °  °This information is not intended to replace advice given to you by your health care provider. Make sure you discuss any questions you have with your health care provider. °  °Document Released: 02/06/2013 Document Revised: 05/09/2014  Document Reviewed: 02/06/2013 °Elsevier Interactive Patient Education ©2016 [employer] ° °

## 2015-10-06 NOTE — Transfer of Care (Signed)
Immediate Anesthesia Transfer of Care Note  Patient: Mark Singh  Procedure(s) Performed: Procedure(s): CARDIOVERSION (N/A)  Patient Location: Endoscopy Unit  Anesthesia Type:MAC  Level of Consciousness: awake, alert  and oriented  Airway & Oxygen Therapy: Patient Spontanous Breathing and Patient connected to nasal cannula oxygen  Post-op Assessment: Report given to RN, Post -op Vital signs reviewed and stable and Patient moving all extremities X 4  Post vital signs: Reviewed and stable  Last Vitals:  Filed Vitals:   10/06/15 1239  BP: 188/91  Pulse: 66  Temp: 36.8 C  Resp: 15    Last Pain: There were no vitals filed for this visit.       Complications: No apparent anesthesia complications

## 2015-10-06 NOTE — CV Procedure (Signed)
   Electrical Cardioversion Procedure Note DEWY KOLODZIEJ [id number] [birth year]  Procedure: Electrical Cardioversion Indications:  Atrial Fibrillation  Time Out: Verified patient identification, verified procedure,medications/allergies/relevent history reviewed, required imaging and test results available.  Performed  Procedure Details  The patient was NPO after midnight. Anesthesia was administered at the beside  by Dr.Hodierne with 70mg  of propofol and 60mg  Lidocaine.  Cardioversion was done with synchronized biphasic defibrillation with AP pads with 150watts.  The patient converted to normal sinus rhythm. The patient tolerated the procedure well   IMPRESSION:  Successful cardioversion of atrial fibrillation to sinus bradycardia.    Dak Szumski 10/06/2015, 1:04 PM

## 2015-10-06 NOTE — H&P (View-Only) (Signed)
Electrophysiology Office Note   Date:  09/29/2015   ID:  Mark Singh, DOB [birth year], MRN [id number]  PCP:  [name], MD  Cardiologist:  Marlou Porch Primary Electrophysiologist:  Tai Syfert Meredith Leeds, MD    Chief Complaint  Patient presents with  . Follow-up  . persistant afib     History of Present Illness: Mark Singh is a 70 y.o. male who presents today for electrophysiology evaluation.   history of PVC's, bradycardia, nephrotic syndrome (complete responder to steroid therapy by Dr. Lorrene Reid), DM, HTN, HLD, obesity, orthostatic hypotension, and mild pulm HTN felt 2/2 obesity Comes in today now s/p ablation procedure for his PVC's.  His PVCs were mapped to inferior to the [facility] and ablated.    Today, he denies symptoms of palpitations, chest pain, shortness of breath, orthopnea, PND, lower extremity edema, claudication, dizziness, presyncope, syncope, bleeding, or neurologic sequela. The patient is tolerating medications without difficulties and is otherwise without complaint today.  He is back in atrial fibrillation currently. He is not aware of atrial fibrillation today. He says that he is able to exercise without any problems. He does say that his heart rate gets into the 130s when he is walking on a treadmill and exerting himself. Otherwise he has no symptoms of shortness of breath or palpitations. He does not complain of fatigue.   Past Medical History  Diagnosis Date  . Penile neoplasm   . Hyperlipidemia   . Type 2 diabetes mellitus ([place])   . H/O: gout     right foot  . Arthritis   . Seasonal asthma     mild  . Asthma   . HTN (hypertension)   . Orthostatic hypotension   . Myalgia and myositis, unspecified   . Edema     a. 2D echo 11/2013: EF 0000000, LV diastolic function parameters were normal, severely dilated LA, PASP 69mmHg..  . Nephrotic syndrome     a. responder to steroid therapy by Dr. Lorrene Reid.   . Back pain   . Shoulder pain   . PVC's  (premature ventricular contractions)     a. Holter 05/2013 showing 18k PVCs (18% of the time).  . Bradycardia     a. Repoted h/o HR in the 40s.  . Obesity   . Pulmonary hypertension ([place])     a. Mild pulm HTN felt 2/2 obesity.   Past Surgical History  Procedure Laterality Date  . Umbilical hernia repair  11-04-2005  . Left foot surgery  1992  &  2008    REMOVAL HEAL SPUR/ BUNIONECTOMY AND HAMMERTOE REPAIR X2  . Circumcision N/A 11/27/2012    Procedure:  CIRCUMCISION  AND EXCISION OF GLANS PENIS;  Surgeon: Fredricka Bonine, MD;  Location: [facility];  Service: Urology;  Laterality: N/A;  . Hammer toe surgery    . Electrophysiologic study N/A 03/30/2015    Procedure: PVC Ablation;  Surgeon: Iyanna Drummer Meredith Leeds, MD;  Location: [place] [facility];  Service: Cardiovascular;  Laterality: N/A;  . Cardioversion N/A 07/22/2015    Procedure: CARDIOVERSION;  Surgeon: Dorothy Spark, MD;  Location: [facility] ENDOSCOPY;  Service: Cardiovascular;  Laterality: N/A;     Current Outpatient Prescriptions  Medication Sig Dispense Refill  . Cholecalciferol (VITAMIN D3) 5000 units CAPS Take 1 capsule by mouth daily.    . Fluticasone-Salmeterol (ADVAIR DISKUS) 100-50 MCG/DOSE AEPB Inhale 1 puff into the lungs 2 (two) times daily as needed (short of breath).     . metFORMIN (GLUCOPHAGE)  500 MG tablet Take 500 mg by mouth 2 (two) times daily with a meal.    . metoprolol succinate (TOPROL-XL) 25 MG 24 hr tablet Take 1 tablet (25 mg total) by mouth daily. 30 tablet 11  . nystatin (MYCOSTATIN) powder Apply topically as directed.     . pimecrolimus (ELIDEL) 1 % cream Apply topically as directed.     . probenecid (BENEMID) 500 MG tablet Take 500 mg by mouth 2 (two) times daily.     . rivaroxaban (XARELTO) 20 MG TABS tablet Take 1 tablet (20 mg total) by mouth daily with supper. 30 tablet 11  . simvastatin (ZOCOR) 40 MG tablet Take 40 mg by mouth daily.    . vitamin B-12 (CYANOCOBALAMIN) 1000 MCG  tablet Take 1,000 mcg by mouth daily.     No current facility-administered medications for this visit.    Allergies:   Indomethacin   Social History:  The patient  reports that he has never smoked. He has never used smokeless tobacco. He reports that he does not drink alcohol or use illicit drugs.   Family History:  The patient's family history includes CVA in his father; Coronary artery disease in his mother.    ROS:  Please see the history of present illness.   Otherwise, review of systems is positive for none.   All other systems are reviewed and negative.    PHYSICAL EXAM: VS:  BP 120/100 mmHg  Pulse 74  Ht 5\' 9"  (1.753 m)  Wt 242 lb 12.8 oz (110.133 kg)  BMI 35.84 kg/m2 , BMI Body mass index is 35.84 kg/(m^2). GEN: Well nourished, well developed, in no acute distress HEENT: normal Neck: no JVD, carotid bruits, or masses Cardiac: irregular; no murmurs, rubs, or gallops,no edema  Respiratory:  clear to auscultation bilaterally, normal work of breathing GI: soft, nontender, nondistended, + BS MS: no deformity or atrophy Skin: warm and dry Neuro:  Strength and sensation are intact Psych: euthymic mood, full affect  EKG:  EKG is ordered today. The ekg ordered today shows atrial fibrillation  Recent Labs: 02/03/2015: Magnesium 1.9; TSH 3.091 07/16/2015: BUN 13; Creat 1.24*; Hemoglobin 15.4; Platelets 328; Potassium 4.6; Sodium 141    Lipid Panel  No results found for: CHOL, TRIG, HDL, CHOLHDL, VLDL, LDLCALC, LDLDIRECT   Wt Readings from Last 3 Encounters:  09/29/15 242 lb 12.8 oz (110.133 kg)  08/17/15 245 lb 9.6 oz (111.403 kg)  07/29/15 250 lb 6.4 oz (113.581 kg)      Other studies Reviewed: Additional studies/ records that were reviewed today include: TTE 12/28/14  Review of the above records today demonstrates:  - Left ventricle: The cavity size was normal. There was mild concentric hypertrophy. Systolic function was normal. The estimated ejection fraction  was in the range of 55% to 60%. Although no diagnostic regional wall motion abnormality was identified, this possibility cannot be completely excluded on the basis of this study. Left ventricular diastolic function parameters were normal. - Left atrium: The atrium was severely dilated. - Pulmonary arteries: Systolic pressure was mildly increased. PA peak pressure: 38 mm Hg (S).   ASSESSMENT AND PLAN:  1.  PVCs: had ablation done in November with good result.    2. Atrial fibrillation: Placed on Xarelto for anticoagulation. He has not had many symptoms, but in speaking with him, a trial of sinus rhythm would be good. He had cardioversion without return to sinus rhythm.  Antonea Gaut plan for him to have him start flecainide 75 mg BID  with repeat cardioversion in one week.    This patients CHA2DS2-VASc Score and unadjusted Ischemic Stroke Rate (% per year) is equal to 3.2 % stroke rate/year from a score of 3  Above score calculated as 1 point each if present [CHF, HTN, DM, Vascular=MI/PAD/Aortic Plaque, Age if 65-74, or Male] Above score calculated as 2 points each if present [Age > 75, or Stroke/TIA/TE]    Current medicines are reviewed at length with the patient today.   The patient does not have concerns regarding his medicines.  The following changes were made today:  Xarelto  Labs/ tests ordered today include:  No orders of the defined types were placed in this encounter.     Disposition:   FU with Garrie Woodin 3 months  Signed, Iyani Dresner Meredith Leeds, MD  09/29/2015 11:26 AM     [facility] [address] [place] [address] [phone] (office) [phone] (fax)

## 2015-10-07 NOTE — Anesthesia Postprocedure Evaluation (Signed)
Anesthesia Post Note  Patient: Mark Singh  Procedure(s) Performed: Procedure(s) (LRB): CARDIOVERSION (N/A)  Patient location during evaluation: PACU Anesthesia Type: General Level of consciousness: awake and alert and patient cooperative Pain management: pain level controlled Vital Signs Assessment: post-procedure vital signs reviewed and stable Respiratory status: spontaneous breathing and respiratory function stable Cardiovascular status: stable Anesthetic complications: no     Last Vitals:  Filed Vitals:   10/06/15 1405 10/06/15 1415  BP: 129/69 130/66  Pulse: 49 46  Temp:    Resp: 15 15    Last Pain: There were no vitals filed for this visit. Pain Goal:                 Chistine Dematteo S

## 2015-10-13 ENCOUNTER — Other Ambulatory Visit: Payer: Self-pay

## 2015-10-13 ENCOUNTER — Ambulatory Visit (HOSPITAL_COMMUNITY)
Admission: RE | Admit: 2015-10-13 | Discharge: 2015-10-13 | Disposition: A | Discharge status: Home / Self Care | Payer: Medicare Other | Source: Ambulatory Visit | Attending: Nurse Practitioner | Admitting: Nurse Practitioner

## 2015-10-13 ENCOUNTER — Encounter (HOSPITAL_COMMUNITY): Payer: Self-pay | Admitting: Nurse Practitioner

## 2015-10-13 VITALS — BP 118/60 | HR 49 | Ht 69.0 in | Wt 247.6 lb

## 2015-10-13 DIAGNOSIS — Z79899 Other long term (current) drug therapy: Secondary | ICD-10-CM | POA: Insufficient documentation

## 2015-10-13 DIAGNOSIS — Z7901 Long term (current) use of anticoagulants: Secondary | ICD-10-CM | POA: Diagnosis not present

## 2015-10-13 DIAGNOSIS — Z888 Allergy status to other drugs, medicaments and biological substances status: Secondary | ICD-10-CM | POA: Insufficient documentation

## 2015-10-13 DIAGNOSIS — E669 Obesity, unspecified: Secondary | ICD-10-CM | POA: Diagnosis not present

## 2015-10-13 DIAGNOSIS — E785 Hyperlipidemia, unspecified: Secondary | ICD-10-CM | POA: Insufficient documentation

## 2015-10-13 DIAGNOSIS — Z8489 Family history of other specified conditions: Secondary | ICD-10-CM | POA: Diagnosis not present

## 2015-10-13 DIAGNOSIS — Z9889 Other specified postprocedural states: Secondary | ICD-10-CM | POA: Diagnosis not present

## 2015-10-13 DIAGNOSIS — M1990 Unspecified osteoarthritis, unspecified site: Secondary | ICD-10-CM | POA: Insufficient documentation

## 2015-10-13 DIAGNOSIS — I481 Persistent atrial fibrillation: Secondary | ICD-10-CM

## 2015-10-13 DIAGNOSIS — I272 Other secondary pulmonary hypertension: Secondary | ICD-10-CM | POA: Diagnosis not present

## 2015-10-13 DIAGNOSIS — J45909 Unspecified asthma, uncomplicated: Secondary | ICD-10-CM | POA: Diagnosis not present

## 2015-10-13 DIAGNOSIS — I10 Essential (primary) hypertension: Secondary | ICD-10-CM | POA: Diagnosis not present

## 2015-10-13 DIAGNOSIS — Z7984 Long term (current) use of oral hypoglycemic drugs: Secondary | ICD-10-CM | POA: Diagnosis not present

## 2015-10-13 DIAGNOSIS — E119 Type 2 diabetes mellitus without complications: Secondary | ICD-10-CM | POA: Diagnosis not present

## 2015-10-13 DIAGNOSIS — I4819 Other persistent atrial fibrillation: Secondary | ICD-10-CM

## 2015-10-13 DIAGNOSIS — Z823 Family history of stroke: Secondary | ICD-10-CM | POA: Insufficient documentation

## 2015-10-13 MED ORDER — METOPROLOL SUCCINATE ER 25 MG PO TB24: 12.5000 mg | ORAL_TABLET | Freq: Every day | ORAL | Status: DC

## 2015-10-13 NOTE — Progress Notes (Signed)
Patient ID: Mark Singh, male   DOB: 08-01-1945, 70 y.o.   MRN: [id number]     Primary Care Physician: [name], MD Referring Physician: [name] Cardiologist: Dr. Maxwell Caul is a 70 y.o. male with a h/o persistent afib  that recently underwent loading of flecainide by [name] and repeat cardioversion 6/6 and is in sinus brady today. He had a cardioversion 3/29 which was successful for a period of time. He does not feel any different in afib as in SR. Feels slightly lightheaded sometimes when standing.Continues xarelto.  Today, he denies symptoms of palpitations, chest pain, shortness of breath, orthopnea, PND, lower extremity edema, dizziness, presyncope, syncope, or neurologic sequela. The patient is tolerating medications without difficulties and is otherwise without complaint today.   Past Medical History  Diagnosis Date  . Penile neoplasm   . Hyperlipidemia   . Type 2 diabetes mellitus ([place])   . H/O: gout     right foot  . Arthritis   . Seasonal asthma     mild  . Asthma   . HTN (hypertension)   . Orthostatic hypotension   . Myalgia and myositis, unspecified   . Edema     a. 2D echo 11/2013: EF 0000000, LV diastolic function parameters were normal, severely dilated LA, PASP 45mmHg..  . Nephrotic syndrome     a. responder to steroid therapy by Dr. Lorrene Reid.   . Back pain   . Shoulder pain   . PVC's (premature ventricular contractions)     a. Holter 05/2013 showing 18k PVCs (18% of the time).  . Bradycardia     a. Repoted h/o HR in the 40s.  . Obesity   . Pulmonary hypertension ([place])     a. Mild pulm HTN felt 2/2 obesity.   Past Surgical History  Procedure Laterality Date  . Umbilical hernia repair  11-04-2005  . Left foot surgery  1992  &  2008    REMOVAL HEAL SPUR/ BUNIONECTOMY AND HAMMERTOE REPAIR X2  . Circumcision N/A 11/27/2012    Procedure:  CIRCUMCISION  AND EXCISION OF GLANS PENIS;  Surgeon: Fredricka Bonine, MD;   Location: [facility];  Service: Urology;  Laterality: N/A;  . Hammer toe surgery    . Electrophysiologic study N/A 03/30/2015    Procedure: PVC Ablation;  Surgeon: [name], MD;  Location: [place] [facility];  Service: Cardiovascular;  Laterality: N/A;  . Cardioversion N/A 07/22/2015    Procedure: CARDIOVERSION;  Surgeon: Dorothy Spark, MD;  Location: [place];  Service: Cardiovascular;  Laterality: N/A;  . Cardioversion N/A 10/06/2015    Procedure: CARDIOVERSION;  Surgeon: Sueanne Margarita, MD;  Location: [place] ENDOSCOPY;  Service: Cardiovascular;  Laterality: N/A;    Current Outpatient Prescriptions  Medication Sig Dispense Refill  . Cholecalciferol (VITAMIN D3) 5000 units CAPS Take 5,000 Units by mouth daily.     . flecainide (TAMBOCOR) 150 MG tablet Take 0.5 tablets (75 mg total) by mouth 2 (two) times daily. 30 tablet 3  . Fluticasone-Salmeterol (ADVAIR DISKUS) 100-50 MCG/DOSE AEPB Inhale 1 puff into the lungs 2 (two) times daily as needed (shortness of breath).     . metFORMIN (GLUCOPHAGE) 500 MG tablet Take 500 mg by mouth 2 (two) times daily with a meal.    . metoprolol succinate (TOPROL-XL) 25 MG 24 hr tablet Take 0.5 tablets (12.5 mg total) by mouth daily with supper. 30 tablet 11  . nystatin (MYCOSTATIN) powder Apply topically  2 (two) times daily as needed (rash).     . pimecrolimus (ELIDEL) 1 % cream Apply 1 application topically 2 (two) times daily as needed (psoriasis).     . probenecid (BENEMID) 500 MG tablet Take 500 mg by mouth 2 (two) times daily.     . rivaroxaban (XARELTO) 20 MG TABS tablet Take 1 tablet (20 mg total) by mouth daily with supper. 30 tablet 11  . simvastatin (ZOCOR) 40 MG tablet Take 40 mg by mouth daily with supper.     . vitamin B-12 (CYANOCOBALAMIN) 1000 MCG tablet Take 1,000 mcg by mouth daily.     No current facility-administered medications for this encounter.    Allergies  Allergen Reactions  . Indomethacin Other (See  Comments)    dizziness    Social History   Social History  . Marital Status: Married    Spouse Name: N/A  . Number of Children: N/A  . Years of Education: N/A   Occupational History  . Not on file.   Social History Main Topics  . Smoking status: Never Smoker   . Smokeless tobacco: Never Used  . Alcohol Use: No  . Drug Use: No  . Sexual Activity: No   Other Topics Concern  . Not on file   Social History Narrative    Family History  Problem Relation Age of Onset  . Coronary artery disease Mother   . CVA Father     ROS- All systems are reviewed and negative except as per the HPI above  Physical Exam: Filed Vitals:   10/13/15 1321  BP: 118/60  Pulse: 49  Height: 5\' 9"  (1.753 m)  Weight: 247 lb 9.6 oz (112.311 kg)    GEN- The patient is well appearing, alert and oriented x 3 today.   Head- normocephalic, atraumatic Eyes-  Sclera clear, conjunctiva pink Ears- hearing intact Oropharynx- clear Neck- supple, no JVP Lymph- no cervical lymphadenopathy Lungs- Clear to ausculation bilaterally, normal work of breathing Heart- Slow regular rate and rhythm, no murmurs, rubs or gallops, PMI not laterally displaced GI- soft, NT, ND, + BS Extremities- no clubbing, cyanosis, or edema MS- no significant deformity or atrophy Skin- no rash or lesion Psych- euthymic mood, full affect Neuro- strength and sensation are intact  EKG- Sinus brady at 49 bpm, pr int 218 ms, qrs int 96 ms, qtc 402 ms Epic records reviewed  Assessment and Plan: 1. Persistent afib Staying in SR but with slow ventricular response at 49 bpm Continue on flecainide at 75 mg bid Decrease metoprolol to 12.5 mg a day He will return next week for ETT on flecainide  Continue on xarelto 20 mg a day  Butch Penny C. Carroll, [facility] [address] [place], [place] [address] [phone]

## 2015-10-13 NOTE — Patient Instructions (Signed)
Your physician has recommended you make the following change in your medication:  1)Decrease metoprolol to 12.5mg  once a day

## 2015-10-20 ENCOUNTER — Ambulatory Visit (HOSPITAL_COMMUNITY)
Admission: RE | Admit: 2015-10-20 | Discharge: 2015-10-20 | Disposition: A | Discharge status: Home / Self Care | Payer: Medicare Other | Source: Ambulatory Visit | Attending: Nurse Practitioner | Admitting: Nurse Practitioner

## 2015-10-20 ENCOUNTER — Other Ambulatory Visit: Payer: Self-pay

## 2015-10-20 DIAGNOSIS — I481 Persistent atrial fibrillation: Secondary | ICD-10-CM | POA: Diagnosis not present

## 2015-10-20 DIAGNOSIS — I4819 Other persistent atrial fibrillation: Secondary | ICD-10-CM

## 2015-11-18 ENCOUNTER — Telehealth: Payer: Self-pay | Admitting: Cardiology

## 2015-11-18 NOTE — Telephone Encounter (Signed)
Did not need this encounter,we need a call from the surgeon office.

## 2015-11-20 ENCOUNTER — Telehealth: Payer: Self-pay | Admitting: Cardiology

## 2015-11-20 NOTE — Telephone Encounter (Signed)
F/u   FYI   Called pt to ask him to have his surgeon contact our office for clearance, pt said he called twice and told them to call our office and pt as well and no one has responded.  His procedure is on 11/26/15.

## 2015-11-20 NOTE — Telephone Encounter (Signed)
Patient having an additional excision - redo circumcision secondary to penile cancer. Asking about stopping Xarelto prior to procedure. Reviewed with pharmacist, Jinny Blossom -- advised pt to stop 2 days prior to procedure. Patient thanks me for helping with this.

## 2015-11-20 NOTE — Telephone Encounter (Signed)
New message     Pt has an upcoming surgery and wants to know when he needs to stop taking his Xarelto. Please call.

## 2015-11-20 NOTE — Telephone Encounter (Signed)
Please ask the pt to have his surgeon contact the office with questions concerning his surgery as we do not know what kind of surgery the pt is having or by who. Thanks.

## 2015-11-25 ENCOUNTER — Encounter: Payer: Self-pay | Admitting: Cardiology

## 2015-11-25 ENCOUNTER — Ambulatory Visit (INDEPENDENT_AMBULATORY_CARE_PROVIDER_SITE_OTHER): Payer: Medicare Other | Admitting: Cardiology

## 2015-11-25 VITALS — BP 126/76 | HR 58 | Ht 69.0 in | Wt 250.1 lb

## 2015-11-25 DIAGNOSIS — E669 Obesity, unspecified: Secondary | ICD-10-CM

## 2015-11-25 DIAGNOSIS — I4819 Other persistent atrial fibrillation: Secondary | ICD-10-CM | POA: Insufficient documentation

## 2015-11-25 DIAGNOSIS — E785 Hyperlipidemia, unspecified: Secondary | ICD-10-CM | POA: Diagnosis not present

## 2015-11-25 DIAGNOSIS — Z7901 Long term (current) use of anticoagulants: Secondary | ICD-10-CM

## 2015-11-25 DIAGNOSIS — I480 Paroxysmal atrial fibrillation: Secondary | ICD-10-CM

## 2015-11-25 DIAGNOSIS — Z8639 Personal history of other endocrine, nutritional and metabolic disease: Secondary | ICD-10-CM | POA: Insufficient documentation

## 2015-11-25 DIAGNOSIS — Z8679 Personal history of other diseases of the circulatory system: Secondary | ICD-10-CM | POA: Insufficient documentation

## 2015-11-25 NOTE — Patient Instructions (Signed)
Your physician recommends that you continue on your current medications as directed. Please refer to the Current Medication list given to you today.  Your physician wants you to follow-up in: 6 months with Dr. Skains. You will receive a reminder letter in the mail two months in advance. If you don't receive a letter, please call our office to schedule the follow-up appointment.  

## 2015-11-25 NOTE — Progress Notes (Signed)
[facility] [address] [place], [place]  [address] Phone: [phone] Fax:  [phone]  Date:  11/25/2015   ID:  Mark Singh, DOB [birth year], MRN [id number]  PCP:  [name], MD   History of Present Illness: Mark Singh is a 70 y.o. male with history of PVC ablation with recent onset paroxysmal atrial fibrillation with successful cardioversion in March 2017 followed by another occurrence of atrial fibrillation, placed on flecainide then cardioverted again in June 2017 who has been maintaining sinus rhythm since administration of flecainide and low-dose metoprolol. [name]. In review of his note, he did not notice any difference in sinus rhythm when compared to fibrillation. Only possible symptom was sweating slightly more increased when on treadmill at home. He has been compliant with Xarelto. No palpitations, no chest pain. Exercise treadmill test on flecainide was reassuring.  Prior nephrotic syndrome a complete responder to steroid therapy by Dr. Lorrene Reid, diabetes, hypertension. His prior echocardiogram showed a normal EF with diastolic dysfunction and dilated left atrium. He no longer complains of any excessive dyspnea or chest burning/tightness. Prior nuclear stress test was low risk.He still continues to battle with obesity and is eager to lose weight. He continues to take his cholesterol medication. He is normotensive with blood pressures running in the 120s to 130s at all doctors visits. He showed me blood pressures at home which are reasonable.  In 2014 had skin cancer surgery. Tired all of the time at that point. HR at that point was in the 40's, during colonoscopy. ECG showed PVC's frequent. Personally viewed EKG which shows sinus rhythm with ventricular trigeminy. Upright in aVF, upright in V3 through V6, down in aVR.  Previously, Frequent PVCs 18000 were noted on Holter monitor. We started metoprolol extended release 25 mg once a day on  05/13/13. He then underwent the PVC ablation procedure. Previously, He does not feel his PVCs. Echocardiogram was reassuring. He does have mild pulmonary hypertension which is secondary from obesity. Prior thyroid evaluation was unremarkable. No evidence of hyperthyroidism.  He is undergoing redo circumcision for previous penile cancer at [facility], Dr. Dimas Millin.  Wt Readings from Last 3 Encounters:  11/25/15 250 lb 1.9 oz (113.5 kg)  10/13/15 247 lb 9.6 oz (112.3 kg)  10/06/15 242 lb (109.8 kg)     Past Medical History:  Diagnosis Date  . Arthritis   . Asthma   . Back pain   . Bradycardia    a. Repoted h/o HR in the 40s.  . Edema    a. 2D echo 11/2013: EF 0000000, LV diastolic function parameters were normal, severely dilated LA, PASP 42mmHg..  . H/O: gout    right foot  . HTN (hypertension)   . Hyperlipidemia   . Myalgia and myositis, unspecified   . Nephrotic syndrome    a. responder to steroid therapy by Dr. Lorrene Reid.   . Obesity   . Orthostatic hypotension   . Penile neoplasm   . Pulmonary hypertension ([place])    a. Mild pulm HTN felt 2/2 obesity.  [name] PVC's (premature ventricular contractions)    a. Holter 05/2013 showing 18k PVCs (18% of the time).  . Seasonal asthma    mild  . Shoulder pain   . Type 2 diabetes mellitus ([place])     Past Surgical History:  Procedure Laterality Date  . CARDIOVERSION N/A 07/22/2015   Procedure: CARDIOVERSION;  Surgeon: Dorothy Spark, MD;  Location: [place] [place];  Service:  Cardiovascular;  Laterality: N/A;  . CARDIOVERSION N/A 10/06/2015   Procedure: CARDIOVERSION;  Surgeon: Sueanne Margarita, MD;  Location: [place] ENDOSCOPY;  Service: Cardiovascular;  Laterality: N/A;  . CIRCUMCISION N/A 11/27/2012   Procedure:  CIRCUMCISION  AND EXCISION OF GLANS PENIS;  Surgeon: Fredricka Bonine, MD;  Location: [facility];  Service: Urology;  Laterality: N/A;  . ELECTROPHYSIOLOGIC STUDY N/A 03/30/2015   Procedure: PVC Ablation;  Surgeon:  [name], MD;  Location: [place] [facility];  Service: Cardiovascular;  Laterality: N/A;  . HAMMER TOE SURGERY    . LEFT FOOT SURGERY  1992  &  2008   REMOVAL HEAL SPUR/ BUNIONECTOMY AND HAMMERTOE REPAIR X2  . UMBILICAL HERNIA REPAIR  11-04-2005    Current Outpatient Prescriptions  Medication Sig Dispense Refill  . Cholecalciferol (VITAMIN D3) 5000 units CAPS Take 5,000 Units by mouth daily.     . flecainide (TAMBOCOR) 150 MG tablet Take 0.5 tablets (75 mg total) by mouth 2 (two) times daily. 30 tablet 3  . Fluticasone-Salmeterol (ADVAIR DISKUS) 100-50 MCG/DOSE AEPB Inhale 1 puff into the lungs 2 (two) times daily as needed (shortness of breath).     . metFORMIN (GLUCOPHAGE) 500 MG tablet Take 500 mg by mouth 2 (two) times daily with a meal.    . metoprolol succinate (TOPROL-XL) 25 MG 24 hr tablet Take 0.5 tablets (12.5 mg total) by mouth daily with supper. 30 tablet 11  . pimecrolimus (ELIDEL) 1 % cream Apply 1 application topically 2 (two) times daily as needed (psoriasis).     . probenecid (BENEMID) 500 MG tablet Take 500 mg by mouth 2 (two) times daily.     . rivaroxaban (XARELTO) 20 MG TABS tablet Take 1 tablet (20 mg total) by mouth daily with supper. 30 tablet 11  . simvastatin (ZOCOR) 40 MG tablet Take 40 mg by mouth daily with supper.     . vitamin B-12 (CYANOCOBALAMIN) 1000 MCG tablet Take 1,000 mcg by mouth daily.     No current facility-administered medications for this visit.     Allergies:    Allergies  Allergen Reactions  . Indomethacin Other (See Comments)    dizziness    Social History:  The patient  reports that he has never smoked. He has never used smokeless tobacco. He reports that he does not drink alcohol or use drugs.   ROS:  Please see the history of present illness.   Denies any syncope, stroke like symptoms, orthopnea, PND, chest pain. Occasionally he will feel some slight dizziness.  PHYSICAL EXAM: VS:  BP 126/76   Pulse (!) 58   Ht 5\' 9"   (1.753 m)   Wt 250 lb 1.9 oz (113.5 kg)   BMI 36.94 kg/m  Well nourished, well developed, in no acute distress  HEENT: normal  Neck: no JVD  Cardiac:  normal S1, S2; Regular rate and rhythm; no murmur   Lungs:  clear to auscultation bilaterally, no wheezing, rhonchi or rales  Abd: soft, nontender, no hepatomegaly Obese Ext: no edema  Skin: warm and dry  Neuro: no focal abnormalities noted  EKG:   04/22/13 from Dr. Ammie Ferrier office:  ECG showed PVC's frequent. Personally viewed EKG which shows sinus rhythm with ventricular trigeminy. Upright in aVF, upright in V3 through V6, down in aVR. 12/25/13-sinus bradycardia underlying rate 50 with 3 PVCs noted on 12-lead-frequent. PVC isoelectric in lead 1, positive lead to, 3, aVF, negative in aVL, negative aVR, positive in V4 through 6.  Holter monitor 05/07/13-18,000 PVCs Echocardiogram 12/27/13-normal ejection fraction, mild LVH, pulmonary artery pressure estimated 38 mmHg. Labs: 05/07/13-TSH was normal. Electrolytes normal. Exercise treadmill test 10/20/15-on flecainide-normal QRS width. Normal blood pressure response.   ASSESSMENT AND PLAN:  1. Paroxysmal  atrial fibrillation-status post successful cardioversion in March 2017 and June 2017 post Flecainide followed by [name]. Eliquis. Flecainide-slow ventricular response, 49bpm. Metoprolol decreased to 12.5 QD.  No significant change in symptoms with atrial fibrillation. Sweat more than usual on treadmill and heart rate had gone up more than normal when in AFIB. Other than that, he does not feel any symptoms with his fibrillation. He noted that his heart rate was increased on his heart rate monitor.  2. Chronic anticoagulation-continue with Xarelto. Prior creatinine 1.24, hemoglobin 15.4. OK to hold for 2 days prior to redo circumcision on 11/26/15. He has already held his Xarelto last night. He may proceed with surgery with low overall cardiac risk. He is greater than 4 weeks post  cardioversion. 3. Frequent PVCs-previous PVC ablative therapy on 03/30/15. PVCs previously unifocal. No syncope. Echocardiogram reassuring with normal pump function. He states that in the summertime, he has "asthma" and some difficulty with shortness of breath when walking in a hot environment. He is no longer going to the [facility], trouble parking. 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 He is motivated to try to lose weight. Mild dizziness previously may have been associated with lower effective heart rate. 24-hour Holter monitor-previously 18,000 PVCs. Very frequent burden.  Prior nuclear stress test was also low risk. TSH was normal.  4. Obesity-encourage weight loss. Fit bit. Discussed decreasing carbohydrates. 5. Hypertension-currently reasonably controlled but on upper limit of normal.. Has [employer] blood pressure cuff. 6. Hyperlipidemia-simvastatin. Continue. 7. We will see him back in 6 months.  Signed, [name], MD [facility]  11/25/2015 8:58 AM

## 2016-02-12 ENCOUNTER — Other Ambulatory Visit: Payer: Self-pay | Admitting: Cardiology

## 2016-03-18 ENCOUNTER — Other Ambulatory Visit: Payer: Self-pay | Admitting: Cardiology

## 2016-05-30 ENCOUNTER — Ambulatory Visit: Payer: Medicare Other | Admitting: Cardiology

## 2016-06-09 ENCOUNTER — Ambulatory Visit (INDEPENDENT_AMBULATORY_CARE_PROVIDER_SITE_OTHER): Payer: Medicare Other | Admitting: Cardiology

## 2016-06-09 ENCOUNTER — Encounter: Payer: Self-pay | Admitting: Cardiology

## 2016-06-09 VITALS — BP 120/72 | HR 66 | Ht 70.0 in | Wt 257.0 lb

## 2016-06-09 DIAGNOSIS — I493 Ventricular premature depolarization: Secondary | ICD-10-CM | POA: Diagnosis not present

## 2016-06-09 DIAGNOSIS — Z7901 Long term (current) use of anticoagulants: Secondary | ICD-10-CM

## 2016-06-09 DIAGNOSIS — E7800 Pure hypercholesterolemia, unspecified: Secondary | ICD-10-CM

## 2016-06-09 DIAGNOSIS — I480 Paroxysmal atrial fibrillation: Secondary | ICD-10-CM

## 2016-06-09 NOTE — Patient Instructions (Signed)

## 2016-06-09 NOTE — Progress Notes (Signed)
[facility]. [address] [place], [place]  [address] Phone: [phone] Fax:  [phone]  Date:  06/09/2016   ID:  Mark Singh, DOB Dec 28, 1945, MRN [id number]  PCP:  [name], MD   History of Present Illness: Mark Singh is a 71 y.o. male with history of PVC ablation with recent onset paroxysmal atrial fibrillation with successful cardioversion in March 2017 followed by another occurrence of atrial fibrillation, placed on flecainide then cardioverted again in June 2017 who has been maintaining sinus rhythm since administration of flecainide and low-dose metoprolol. [name]. In review of his note, he did not notice any difference in sinus rhythm when compared to fibrillation. Only possible symptom was sweating slightly more increased when on treadmill at home. He has been compliant with Xarelto. No palpitations, no chest pain. Exercise treadmill test on flecainide was reassuring.  Prior nephrotic syndrome a complete responder to steroid therapy by Dr. Lorrene Reid, diabetes, hypertension. His prior echocardiogram showed a normal EF with diastolic dysfunction and dilated left atrium. He no longer complains of any excessive dyspnea or chest burning/tightness. Prior nuclear stress test was low risk.He still continues to battle with obesity and is eager to lose weight. He continues to take his cholesterol medication. He is normotensive with blood pressures running in the 120s to 130s at all doctors visits. He showed me blood pressures at home which are reasonable.  In 2014 had skin cancer surgery. Tired all of the time at that point. HR at that point was in the 40's, during colonoscopy. ECG showed PVC's frequent. Personally viewed EKG which shows sinus rhythm with ventricular trigeminy. Upright in aVF, upright in V3 through V6, down in aVR.  Previously, Frequent PVCs 18000 were noted on Holter monitor. We started metoprolol extended release 25 mg once a day on  05/13/13. He then underwent the PVC ablation procedure. Previously, He does not feel his PVCs. Echocardiogram was reassuring. He does have mild pulmonary hypertension which is secondary from obesity. Prior thyroid evaluation was unremarkable. No evidence of hyperthyroidism.  He went for redo circumcision for previous penile cancer at [facility], Dr. Dimas Millin.  06/09/16-overall doing well. He's had a stressful winter. Has gained approximate 10 pounds since the summer time. No chest pain, no shortness of breath, no syncope. No bleeding episodes with Xarelto. He has not felt any palpitations.  Wt Readings from Last 3 Encounters:  06/09/16 257 lb (116.6 kg)  11/25/15 250 lb 1.9 oz (113.5 kg)  10/13/15 247 lb 9.6 oz (112.3 kg)     Past Medical History:  Diagnosis Date  . Arthritis   . Asthma   . Back pain   . Bradycardia    a. Repoted h/o HR in the 40s.  . Edema    a. 2D echo 11/2013: EF 77-41%, LV diastolic function parameters were normal, severely dilated LA, PASP 16mmHg..  . H/O: gout    right foot  . HTN (hypertension)   . Hyperlipidemia   . Myalgia and myositis, unspecified   . Nephrotic syndrome    a. responder to steroid therapy by Dr. Lorrene Reid.   . Obesity   . Orthostatic hypotension   . Penile neoplasm   . Pulmonary hypertension    a. Mild pulm HTN felt 2/2 obesity.  [name] PVC's (premature ventricular contractions)    a. Holter 05/2013 showing 18k PVCs (18% of the time).  . Seasonal asthma    mild  . Shoulder pain   .  Type 2 diabetes mellitus ([place])     Past Surgical History:  Procedure Laterality Date  . CARDIOVERSION N/A 07/22/2015   Procedure: CARDIOVERSION;  Surgeon: Dorothy Spark, MD;  Location: [facility];  Service: Cardiovascular;  Laterality: N/A;  . CARDIOVERSION N/A 10/06/2015   Procedure: CARDIOVERSION;  Surgeon: Sueanne Margarita, MD;  Location: [facility];  Service: Cardiovascular;  Laterality: N/A;  . CIRCUMCISION N/A 11/27/2012   Procedure:  CIRCUMCISION  AND  EXCISION OF GLANS PENIS;  Surgeon: Fredricka Bonine, MD;  Location: [facility];  Service: Urology;  Laterality: N/A;  . ELECTROPHYSIOLOGIC STUDY N/A 03/30/2015   Procedure: PVC Ablation;  Surgeon: [name], MD;  Location: [facility];  Service: Cardiovascular;  Laterality: N/A;  . HAMMER TOE SURGERY    . LEFT FOOT SURGERY  1992  &  2008   REMOVAL HEAL SPUR/ BUNIONECTOMY AND HAMMERTOE REPAIR X2  . UMBILICAL HERNIA REPAIR  11-04-2005    Current Outpatient Prescriptions  Medication Sig Dispense Refill  . Cholecalciferol (VITAMIN D3) 5000 units CAPS Take 5,000 Units by mouth daily.     . flecainide (TAMBOCOR) 150 MG tablet TAKE 0.5 TABLETS (75 MG TOTAL) BY MOUTH 2 (TWO) TIMES DAILY. 30 tablet 7  . Fluticasone-Salmeterol (ADVAIR DISKUS) 100-50 MCG/DOSE AEPB Inhale 1 puff into the lungs 2 (two) times daily as needed (shortness of breath).     . metFORMIN (GLUCOPHAGE) 500 MG tablet Take 500 mg by mouth 2 (two) times daily with a meal.    . metoprolol succinate (TOPROL-XL) 25 MG 24 hr tablet Take 0.5 tablets (12.5 mg total) by mouth daily with supper. 30 tablet 11  . pimecrolimus (ELIDEL) 1 % cream Apply 1 application topically 2 (two) times daily as needed (psoriasis).     . probenecid (BENEMID) 500 MG tablet Take 500 mg by mouth 2 (two) times daily.     . rivaroxaban (XARELTO) 20 MG TABS tablet Take 1 tablet (20 mg total) by mouth daily with supper. 30 tablet 11  . simvastatin (ZOCOR) 40 MG tablet Take 40 mg by mouth daily with supper.     . tamsulosin (FLOMAX) 0.4 MG CAPS capsule Take 0.4 mg by mouth daily.  4  . vitamin B-12 (CYANOCOBALAMIN) 1000 MCG tablet Take 1,000 mcg by mouth daily.     No current facility-administered medications for this visit.     Allergies:    Allergies  Allergen Reactions  . Indomethacin Other (See Comments)    dizziness    Social History:  The patient  reports that he has never smoked. He has never used smokeless  tobacco. He reports that he does not drink alcohol or use drugs.   ROS:  Please see the history of present illness.   Denies any syncope, stroke like symptoms, orthopnea, PND, chest pain. Occasionally he will feel some slight dizziness.  PHYSICAL EXAM: VS:  BP 120/72   Pulse 66   Ht 5\' 10"  (1.778 m)   Wt 257 lb (116.6 kg)   BMI 36.88 kg/m  Well nourished, well developed, in no acute distress  HEENT: normal  Neck: no JVD  Cardiac:  normal S1, S2; Regular rate and rhythm; no murmur   Lungs:  clear to auscultation bilaterally, no wheezing, rhonchi or rales  Abd: soft, nontender, no hepatomegaly Obese Ext: no edema  Skin: warm and dry  Neuro: no focal abnormalities noted  EKG:   04/22/13 from Dr. Ammie Ferrier office:  ECG showed PVC's frequent. Personally viewed  EKG which shows sinus rhythm with ventricular trigeminy. Upright in aVF, upright in V3 through V6, down in aVR. 12/25/13-sinus bradycardia underlying rate 50 with 3 PVCs noted on 12-lead-frequent. PVC isoelectric in lead 1, positive lead to, 3, aVF, negative in aVL, negative aVR, positive in V4 through 6.  Holter monitor 05/07/13-18,000 PVCs Echocardiogram 12/27/13-normal ejection fraction, mild LVH, pulmonary artery pressure estimated 38 mmHg. Labs: 05/07/13-TSH was normal. Electrolytes normal. Exercise treadmill test 10/20/15-on flecainide-normal QRS width. Normal blood pressure response.   ASSESSMENT AND PLAN:  1. Paroxysmal  atrial fibrillation-status post successful cardioversion in March 2017 and June 2017 post Flecainide followed by [name]. Eliquis. Flecainide-slow ventricular response, 49bpm. Metoprolol decreased to 12.5 QD.  No significant change in symptoms with atrial fibrillation. Sweat more than usual on treadmill and heart rate had gone up more than normal when in AFIB. Other than that, he does not feel any symptoms with his fibrillation. He noted that his heart rate was increased on his heart rate monitor.  2. Chronic  anticoagulation-continue with Xarelto. Prior creatinine 1.24, hemoglobin 15.4. 3. Frequent PVCs-previous PVC ablative therapy on 03/30/15. PVCs previously unifocal. No syncope. Echocardiogram reassuring with normal pump function. He states that in the summertime, he has "asthma" and some difficulty with shortness of breath when walking in a hot environment. He is no longer going to the [facility], trouble parking. 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 He is motivated to try to lose weight. The wintertime has been difficult however. Mild dizziness previously may have been associated with lower effective heart rate.  Prior nuclear stress test was also low risk. TSH was normal.  4. Obesity-encourage weight loss. Fit bit. Discussed decreasing carbohydrates. 5. Hypertension-currently controlled.. Has Omron blood pressure cuff. 6. Hyperlipidemia-simvastatin. Continue. 7. We will see him back in 6 months.  Signed, [name], MD [facility]  06/09/2016 11:45 AM

## 2016-09-10 ENCOUNTER — Other Ambulatory Visit: Payer: Self-pay | Admitting: Cardiology

## 2016-10-08 ENCOUNTER — Other Ambulatory Visit: Payer: Self-pay | Admitting: Cardiology

## 2016-10-20 ENCOUNTER — Other Ambulatory Visit: Payer: Self-pay | Admitting: Cardiology

## 2016-12-01 ENCOUNTER — Encounter: Payer: Self-pay | Admitting: Cardiology

## 2016-12-01 ENCOUNTER — Encounter: Payer: Self-pay | Admitting: *Deleted

## 2016-12-01 ENCOUNTER — Ambulatory Visit (INDEPENDENT_AMBULATORY_CARE_PROVIDER_SITE_OTHER): Payer: Medicare Other | Admitting: Cardiology

## 2016-12-01 VITALS — BP 130/80 | HR 78 | Ht 69.0 in | Wt 277.4 lb

## 2016-12-01 DIAGNOSIS — Z01818 Encounter for other preprocedural examination: Secondary | ICD-10-CM

## 2016-12-01 DIAGNOSIS — Z79899 Other long term (current) drug therapy: Secondary | ICD-10-CM

## 2016-12-01 DIAGNOSIS — I493 Ventricular premature depolarization: Secondary | ICD-10-CM

## 2016-12-01 DIAGNOSIS — I480 Paroxysmal atrial fibrillation: Secondary | ICD-10-CM

## 2016-12-01 LAB — CBC
HEMOGLOBIN: 14.3 g/dL (ref 13.0–17.7)
Hematocrit: 41.1 % (ref 37.5–51.0)
MCH: 30.4 pg (ref 26.6–33.0)
MCHC: 34.8 g/dL (ref 31.5–35.7)
MCV: 87 fL (ref 79–97)
Platelets: 332 10*3/uL (ref 150–379)
RBC: 4.7 x10E6/uL (ref 4.14–5.80)
RDW: 13.8 % (ref 12.3–15.4)
WBC: 7 10*3/uL (ref 3.4–10.8)

## 2016-12-01 LAB — BASIC METABOLIC PANEL
BUN/Creatinine Ratio: 10 (ref 10–24)
BUN: 12 mg/dL (ref 8–27)
CO2: 23 mmol/L (ref 20–29)
CREATININE: 1.22 mg/dL (ref 0.76–1.27)
Calcium: 9.3 mg/dL (ref 8.6–10.2)
Chloride: 100 mmol/L (ref 96–106)
GFR calc Af Amer: 69 mL/min/{1.73_m2} (ref >59)
GFR calc non Af Amer: 59 mL/min/{1.73_m2} — ABNORMAL LOW (ref >59)
GLUCOSE: 159 mg/dL — AB (ref 65–99)
Potassium: 4.4 mmol/L (ref 3.5–5.2)
SODIUM: 140 mmol/L (ref 134–144)

## 2016-12-01 MED ORDER — FLECAINIDE ACETATE 100 MG PO TABS: 100.0000 mg | ORAL_TABLET | Freq: Two times a day (BID) | ORAL | 6 refills | Status: DC

## 2016-12-01 NOTE — Progress Notes (Signed)
[facility]. [address] [place], [place]  [address] Phone: [phone] Fax:  [phone]  Date:  12/01/2016   ID:  Mark Singh, DOB 06-14-1945, MRN [id number]  PCP:  Kathyrn Lass, MD   History of Present Illness: Mark Singh is a 71 y.o. male with history of PVC ablation with paroxysmal atrial fibrillation with successful cardioversion in March 2017 followed by another occurrence of atrial fibrillation, placed on flecainide then cardioverted again in June 2017 who has been maintaining sinus rhythm since administration of flecainide and low-dose metoprolol.   [name]. In review of his note, he did not notice any difference in sinus rhythm when compared to fibrillation. Only possible symptom was sweating slightly more increased when on treadmill at home. He has been compliant with Xarelto. No palpitations, no chest pain. Exercise treadmill test on flecainide was reassuring.  On 12/01/16, his EKG was noteworthy for typical atrial flutter. He stated that he did not really feel his flutter pattern. He has gained a significant amount of weight after his wife has died in 10-23-2015. He is eager to seek nutritional help and exercise again.  Prior nephrotic syndrome a complete responder to steroid therapy by Dr. Lorrene Reid, diabetes, hypertension. His prior echocardiogram showed a normal EF with diastolic dysfunction and dilated left atrium. No excessive dyspnea or chest burning/tightness. Prior nuclear stress test was low risk. He still continues to battle with obesity. He continues to take his cholesterol medication. He is normotensive with blood pressures running in the 120s to 130s at all doctors visits. He showed me blood pressures at home which are reasonable.  In 2014 had skin cancer surgery. Tired all of the time at that point. HR at that point was in the 40's, during colonoscopy. ECG showed PVC's frequent. Personally viewed EKG which shows sinus rhythm with  ventricular trigeminy. Upright in aVF, upright in V3 through V6, down in aVR.  Previously, Frequent PVCs 18000 were noted on Holter monitor. We started metoprolol extended release 25 mg once a day on 05/13/13. He then underwent the PVC ablation procedure. Previously, He does not feel his PVCs. Echocardiogram was reassuring. He does have mild pulmonary hypertension which is secondary from obesity. Prior thyroid evaluation was unremarkable. No evidence of hyperthyroidism.  He went for redo circumcision for previous penile cancer at [facility], Dr. Dimas Millin.   Wt Readings from Last 3 Encounters:  12/01/16 277 lb 6.4 oz (125.8 kg)  06/09/16 257 lb (116.6 kg)  11/25/15 250 lb 1.9 oz (113.5 kg)     Past Medical History:  Diagnosis Date  . Arthritis   . Asthma   . Back pain   . Bradycardia    a. Repoted h/o HR in the 40s.  . Edema    a. 2D echo 11/2013: EF 87-86%, LV diastolic function parameters were normal, severely dilated LA, PASP 44mmHg..  . H/O: gout    right foot  . HTN (hypertension)   . Hyperlipidemia   . Myalgia and myositis, unspecified   . Nephrotic syndrome    a. responder to steroid therapy by Dr. Lorrene Reid.   . Obesity   . Orthostatic hypotension   . Penile neoplasm   . Pulmonary hypertension ([place])    a. Mild pulm HTN felt 2/2 obesity.  [name] PVC's (premature ventricular contractions)    a. Holter 05/2013 showing 18k PVCs (18% of the time).  . Seasonal asthma    mild  . Shoulder pain   .  Type 2 diabetes mellitus ([place])     Past Surgical History:  Procedure Laterality Date  . CARDIOVERSION N/A 07/22/2015   Procedure: CARDIOVERSION;  Surgeon: Dorothy Spark, MD;  Location: [facility];  Service: Cardiovascular;  Laterality: N/A;  . CARDIOVERSION N/A 10/06/2015   Procedure: CARDIOVERSION;  Surgeon: Sueanne Margarita, MD;  Location: [facility];  Service: Cardiovascular;  Laterality: N/A;  . CIRCUMCISION N/A 11/27/2012   Procedure:  CIRCUMCISION  AND EXCISION OF GLANS PENIS;   Surgeon: Fredricka Bonine, MD;  Location[facility];  Service: Urology;  Laterality: N/A;  . ELECTROPHYSIOLOGIC STUDY N/A 03/30/2015   Procedure: PVC Ablation;  Surgeon: [name], MD;  Location: [facility] [facility];  Service: Cardiovascular;  Laterality: N/A;  . HAMMER TOE SURGERY    . LEFT FOOT SURGERY  1992  &  2008   REMOVAL HEAL SPUR/ BUNIONECTOMY AND HAMMERTOE REPAIR X2  . UMBILICAL HERNIA REPAIR  11-04-2005    Current Outpatient Prescriptions  Medication Sig Dispense Refill  . Cholecalciferol (VITAMIN D3) 5000 units CAPS Take 5,000 Units by mouth daily.     . flecainide (TAMBOCOR) 100 MG tablet Take 1 tablet (100 mg total) by mouth 2 (two) times daily. 60 tablet 6  . Fluticasone-Salmeterol (ADVAIR DISKUS) 100-50 MCG/DOSE AEPB Inhale 1 puff into the lungs 2 (two) times daily as needed (shortness of breath).     . metFORMIN (GLUCOPHAGE) 500 MG tablet Take 500 mg by mouth 2 (two) times daily with a meal.    . metoprolol succinate (TOPROL-XL) 25 MG 24 hr tablet TAKE 1 TABLET (25 MG TOTAL) BY MOUTH DAILY WITH SUPPER. 30 tablet 7  . pimecrolimus (ELIDEL) 1 % cream Apply 1 application topically 2 (two) times daily as needed (psoriasis).     . probenecid (BENEMID) 500 MG tablet Take 500 mg by mouth 2 (two) times daily.     . simvastatin (ZOCOR) 40 MG tablet Take 40 mg by mouth daily with supper.     . tamsulosin (FLOMAX) 0.4 MG CAPS capsule Take 0.4 mg by mouth daily.  4  . vitamin B-12 (CYANOCOBALAMIN) 1000 MCG tablet Take 1,000 mcg by mouth daily.    Alveda Reasons 20 MG TABS tablet TAKE 1 TABLET (20 MG TOTAL) BY MOUTH DAILY WITH SUPPER. 30 tablet 5   No current facility-administered medications for this visit.     Allergies:    Allergies  Allergen Reactions  . Indomethacin Other (See Comments)    dizziness    Social History:  The patient  reports that he has never smoked. He has never used smokeless tobacco. He reports that he does not drink alcohol or  use drugs.   ROS:  Please see the history of present illness.   Denies any syncope, stroke like symptoms, orthopnea, PND, chest pain. Occasionally he will feel some slight dizziness.  PHYSICAL EXAM: VS:  BP 130/80   Pulse 78   Ht 5\' 9"  (1.753 m)   Wt 277 lb 6.4 oz (125.8 kg)   BMI 40.96 kg/m  GEN: Well nourished, well developed, in no acute distress , obese HEENT: normal  Neck: no JVD, carotid bruits, or masses Cardiac: RRR with regular ectopy; no murmurs, rubs, or gallops,no edema  Respiratory:  clear to auscultation bilaterally, normal work of breathing GI: soft, nontender, nondistended, + BS MS: no deformity or atrophy  Skin: warm and dry, no rash Neuro:  Alert and Oriented x 3, Strength and sensation are intact Psych: euthymic mood,  full affect   EKG: EKG today 12/01/16 shows atrial flutter heart rate 78 bpm with occasional PVCs. Atrial flutter flutter is typical, down sloping P wave activity in 2, 3, aVF upright in V1.  04/22/13 from Dr. Ammie Ferrier office:  ECG showed PVC's frequent. Personally viewed EKG which shows sinus rhythm with ventricular trigeminy. Upright in aVF, upright in V3 through V6, down in aVR. 12/25/13-sinus bradycardia underlying rate 50 with 3 PVCs noted on 12-lead-frequent. PVC isoelectric in lead 1, positive lead to, 3, aVF, negative in aVL, negative aVR, positive in V4 through 6.  Holter monitor 05/07/13-18,000 PVCs Echocardiogram 12/27/13-normal ejection fraction, mild LVH, pulmonary artery pressure estimated 38 mmHg. Labs: 05/07/13-TSH was normal. Electrolytes normal. Exercise treadmill test 10/20/15-on flecainide-normal QRS width. Normal blood pressure response.   ASSESSMENT AND PLAN:  Paroxysmal  atrial fibrillation/atrial flutter typical -I will increase his flecainide to 100 mg twice a day from 75 mg twice a day.   - I will set him up for cardioversion  - I will have him come back in in 2 weeks post cardioversion to see Tommye Standard PA who he has seen in  the past. If he continues to have atrial flutter, typical, could consider ablative therapy although he is not having any significant symptoms with this. Hopefully the increase in flecainide will help. Risks and benefits of cardioversion discussed.  - Discussed weight loss as well.  - status post successful cardioversion in March 2017 and June 2017 post Flecainide followed by [name]. Eliquis. Flecainide-slow ventricular response, 49bpm. Metoprolol decreased to 12.5 QD.  No significant change in symptoms with atrial fibrillation. Sweat more than usual on treadmill and heart rate had gone up more than normal when in AFIB. Other than that, he does not feel any symptoms with his fibrillation. He noted that his heart rate was increased on his heart rate monitor.   Chronic anticoagulation -continue with Xarelto. Prior creatinine 1.24, hemoglobin 15.4. Stable, no bleeding. No missed doses.  Frequent PVCs -previous PVC ablative therapy on 03/30/15. PVCs previously unifocal. No syncope. Echocardiogram reassuring with normal pump function. He states that in the summertime, he has "asthma" and some difficulty with shortness of breath when walking in a hot environment.  Mild dizziness previously may have been associated with lower effective heart rate.  Prior nuclear stress test was also low risk. TSH was normal.   Morbid Obesity -encourage weight loss. Fit bit. Discussed decreasing carbohydrates. Interested in nutritional counseling. We will refer to [facility] program.  Hypertension -currently controlled.. Has Omron blood pressure cuff.  Hyperlipidemia -simvastatin. Continue.  We will see him back post cardioversion 2 weeks, Charlcie Cradle PA.  Signed, [name], MD [facility]  12/01/2016 10:05 AM

## 2016-12-01 NOTE — Patient Instructions (Signed)
Medication Instructions:  Please take Flecainide 100 mg twice a day. Continue all other medications as listed.  Labwork: Please have blood work today. (CBC, BMP)  Testing/Procedures: Your physician has requested that you have a Cardioversion. Electrical Cardioversion uses a jolt of electricity to your heart either through paddles or wired patches attached to your chest. This is a controlled, usually prescheduled, procedure. This procedure is done at the hospital and you are not awake during the procedure. You usually go home the day of the procedure. Please see the instruction sheet given to you today for more information.  Follow-Up: Follow up 2 weeks after your cardioversion with Tommye Standard, PA.  If you need a refill on your cardiac medications before your next appointment, please call your pharmacy.  Thank you for choosing [facility]!!     Electrical Cardioversion Electrical cardioversion is the delivery of a jolt of electricity to restore a normal rhythm to the heart. A rhythm that is too fast or is not regular keeps the heart from pumping well. In this procedure, sticky patches or metal paddles are placed on the chest to deliver electricity to the heart from a device. This procedure may be done in an emergency if:  There is low or no blood pressure as a result of the heart rhythm.  Normal rhythm must be restored as fast as possible to protect the brain and heart from further damage.  It may save a life.  This procedure may also be done for irregular or fast heart rhythms that are not immediately life-threatening. Tell a health care provider about:  Any allergies you have.  All medicines you are taking, including vitamins, herbs, eye drops, creams, and over-the-counter medicines.  Any problems you or family members have had with anesthetic medicines.  Any blood disorders you have.  Any surgeries you have had.  Any medical conditions you have.  Whether you  are pregnant or may be pregnant. What are the risks? Generally, this is a safe procedure. However, problems may occur, including:  Allergic reactions to medicines.  A blood clot that breaks free and travels to other parts of your body.  The possible return of an abnormal heart rhythm within hours or days after the procedure.  Your heart stopping (cardiac arrest). This is rare.  What happens before the procedure? Medicines  Your health care provider may have you start taking: ? Blood-thinning medicines (anticoagulants) so your blood does not clot as easily. ? Medicines may be given to help stabilize your heart rate and rhythm.  Ask your health care provider about changing or stopping your regular medicines. This is especially important if you are taking diabetes medicines or blood thinners. General instructions  Plan to have someone take you home from the hospital or clinic.  If you will be going home right after the procedure, plan to have someone with you for 24 hours.  Follow instructions from your health care provider about eating or drinking restrictions. What happens during the procedure?  To lower your risk of infection: ? Your health care team will wash or sanitize their hands. ? Your skin will be washed with soap.  An IV tube will be inserted into one of your veins.  You will be given a medicine to help you relax (sedative).  Sticky patches (electrodes) or metal paddles may be placed on your chest.  An electrical shock will be delivered. The procedure may vary among health care providers and hospitals. What happens after the procedure?  Your blood pressure, heart rate, breathing rate, and blood oxygen level will be monitored until the medicines you were given have worn off.  Do not drive for 24 hours if you were given a sedative.  Your heart rhythm will be watched to make sure it does not change. This information is not intended to replace advice given to you  by your health care provider. Make sure you discuss any questions you have with your health care provider. Document Released: 04/08/2002 Document Revised: 12/16/2015 Document Reviewed: 10/23/2015 Elsevier Interactive Patient Education  2017 [employer].

## 2016-12-02 NOTE — Addendum Note (Signed)
Addended by: [name] C on: 12/02/2016 07:06 AM   Modules accepted: Orders, SmartSet

## 2016-12-05 ENCOUNTER — Ambulatory Visit (HOSPITAL_COMMUNITY): Payer: Medicare Other | Admitting: Certified Registered"

## 2016-12-05 ENCOUNTER — Encounter (HOSPITAL_COMMUNITY)
Admission: RE | Disposition: A | Discharge status: Home / Self Care | Payer: Self-pay | Source: Ambulatory Visit | Attending: Internal Medicine

## 2016-12-05 ENCOUNTER — Encounter (HOSPITAL_COMMUNITY): Payer: Self-pay | Admitting: *Deleted

## 2016-12-05 ENCOUNTER — Ambulatory Visit (HOSPITAL_COMMUNITY)
Admission: RE | Admit: 2016-12-05 | Discharge: 2016-12-05 | Disposition: A | Discharge status: Home / Self Care | Payer: Medicare Other | Source: Ambulatory Visit | Attending: Internal Medicine | Admitting: Internal Medicine

## 2016-12-05 DIAGNOSIS — E119 Type 2 diabetes mellitus without complications: Secondary | ICD-10-CM | POA: Diagnosis not present

## 2016-12-05 DIAGNOSIS — I483 Typical atrial flutter: Secondary | ICD-10-CM | POA: Diagnosis not present

## 2016-12-05 DIAGNOSIS — E785 Hyperlipidemia, unspecified: Secondary | ICD-10-CM | POA: Insufficient documentation

## 2016-12-05 DIAGNOSIS — J45909 Unspecified asthma, uncomplicated: Secondary | ICD-10-CM | POA: Insufficient documentation

## 2016-12-05 DIAGNOSIS — I10 Essential (primary) hypertension: Secondary | ICD-10-CM | POA: Diagnosis not present

## 2016-12-05 DIAGNOSIS — Z6841 Body Mass Index (BMI) 40.0 and over, adult: Secondary | ICD-10-CM | POA: Insufficient documentation

## 2016-12-05 DIAGNOSIS — I4891 Unspecified atrial fibrillation: Secondary | ICD-10-CM | POA: Insufficient documentation

## 2016-12-05 DIAGNOSIS — I272 Pulmonary hypertension, unspecified: Secondary | ICD-10-CM | POA: Insufficient documentation

## 2016-12-05 DIAGNOSIS — Z7984 Long term (current) use of oral hypoglycemic drugs: Secondary | ICD-10-CM | POA: Insufficient documentation

## 2016-12-05 DIAGNOSIS — E669 Obesity, unspecified: Secondary | ICD-10-CM | POA: Diagnosis not present

## 2016-12-05 DIAGNOSIS — I4892 Unspecified atrial flutter: Secondary | ICD-10-CM | POA: Diagnosis not present

## 2016-12-05 HISTORY — PX: CARDIOVERSION: SHX1299

## 2016-12-05 LAB — GLUCOSE, CAPILLARY: Glucose-Capillary: 118 mg/dL — ABNORMAL HIGH (ref 65–99)

## 2016-12-05 SURGERY — CARDIOVERSION
Anesthesia: General

## 2016-12-05 MED ORDER — SODIUM CHLORIDE 0.9 % IV SOLN
INTRAVENOUS | Status: AC | PRN
  Administered 2016-12-05: 500 mL via INTRAVENOUS

## 2016-12-05 MED ORDER — LIDOCAINE HCL (CARDIAC) 20 MG/ML IV SOLN
INTRAVENOUS | Status: DC | PRN
  Administered 2016-12-05: 60 mg via INTRAVENOUS

## 2016-12-05 MED ORDER — PROPOFOL 10 MG/ML IV BOLUS
INTRAVENOUS | Status: DC | PRN
  Administered 2016-12-05: 50 mg via INTRAVENOUS

## 2016-12-05 MED ORDER — SODIUM CHLORIDE 0.9 % IV SOLN
INTRAVENOUS | Status: DC | PRN
  Administered 2016-12-05: 13:00:00 via INTRAVENOUS

## 2016-12-05 NOTE — CV Procedure (Signed)
   CARDIOVERSION NOTE  Procedure: Electrical Cardioversion Indications:  Atrial Flutter  Procedure Details:  Consent: Risks of procedure as well as the alternatives and risks of each were explained to the (patient/caregiver).  Consent for procedure obtained.  Time Out: Verified patient identification, verified procedure, site/side was marked, verified correct patient position, special equipment/implants available, medications/allergies/relevent history reviewed, required imaging and test results available.  Performed  Patient placed on cardiac monitor, pulse oximetry, supplemental oxygen as necessary.  Sedation given: Propofol per anesthesia Pacer pads placed anterior and posterior chest.  Cardioverted 1 time(s).  Cardioverted at 150J biphasic.  Impression: Findings: Post procedure EKG shows: NSR Complications: None Patient did tolerate procedure well.  Plan: 1. Successful DCCV with a single 150J biphasic shock to NSR.  Time Spent Directly with the Patient:  30 minutes   Mark Casino, MD, [place]  Attending Cardiologist  Direct Dial: [phone]  Fax: [phone]  Website:  [email or web address] 12/05/2016, 1:17 PM

## 2016-12-05 NOTE — Anesthesia Procedure Notes (Signed)
Procedure Name: MAC Date/Time: 12/05/2016 1:05 PM Performed by: Teressa Lower Pre-anesthesia Checklist: Patient identified, Emergency Drugs available, Patient being monitored, Suction available and Timeout performed Patient Re-evaluated:Patient Re-evaluated prior to induction Oxygen Delivery Method: Nasal cannula

## 2016-12-05 NOTE — Anesthesia Preprocedure Evaluation (Signed)
Anesthesia Evaluation  Patient identified by MRN, date of birth, ID band Patient awake    Reviewed: Allergy & Precautions, NPO status , Patient's Chart, lab work & pertinent test results, reviewed documented beta blocker date and time   Airway Mallampati: II       Dental  (+) Teeth Intact   Pulmonary asthma ,    breath sounds clear to auscultation       Cardiovascular hypertension, Pt. on home beta blockers + dysrhythmias Atrial Fibrillation  Rhythm:irregular Rate:Normal  ECG: A-flutter  Pulm HTN   Neuro/Psych    GI/Hepatic   Endo/Other  diabetes, Type 2, Oral Hypoglycemic Agents  Renal/GU Renal InsufficiencyRenal disease     Musculoskeletal   Abdominal (+) + obese,   Peds  Hematology   Anesthesia Other Findings Hyperlipidemia  Reproductive/Obstetrics                             Anesthesia Physical  Anesthesia Plan  ASA: III  Anesthesia Plan: General   Post-op Pain Management:    Induction: Intravenous  PONV Risk Score and Plan: 2 and Treatment may vary due to age or medical condition and Propofol infusion  Airway Management Planned:   Additional Equipment:   Intra-op Plan:   Post-operative Plan:   Informed Consent: I have reviewed the patients History and Physical, chart, labs and discussed the procedure including the risks, benefits and alternatives for the proposed anesthesia with the patient or authorized representative who has indicated his/her understanding and acceptance.   Dental advisory given  Plan Discussed with: CRNA  Anesthesia Plan Comments:         Anesthesia Quick Evaluation

## 2016-12-05 NOTE — Transfer of Care (Signed)
Immediate Anesthesia Transfer of Care Note  Patient: Mark Singh  Procedure(s) Performed: Procedure(s): CARDIOVERSION (N/A)  Patient Location: Endoscopy Unit  Anesthesia Type:General  Level of Consciousness: awake, alert  and oriented  Airway & Oxygen Therapy: Patient Spontanous Breathing and Patient connected to nasal cannula oxygen  Post-op Assessment: Report given to RN and Post -op Vital signs reviewed and stable  Post vital signs: Reviewed and stable  Last Vitals:  Vitals:   12/05/16 1210  BP: (!) 168/82  Resp: (!) 26  Temp: 36.7 C    Last Pain:  Vitals:   12/05/16 1210  TempSrc: Oral         Complications: No apparent anesthesia complications

## 2016-12-05 NOTE — H&P (Signed)
    INTERVAL PROCEDURE H&P  History and Physical Interval Note:  12/05/2016 11:49 AM  Mark Singh has presented today for their planned procedure. The various methods of treatment have been discussed with the patient and family. After consideration of risks, benefits and other options for treatment, the patient has consented to the procedure.  The patients' outpatient history has been reviewed, patient examined, and no change in status from most recent office note within the past 30 days. I have reviewed the patients' chart and labs and will proceed as planned. Questions were answered to the patient's satisfaction.   Pixie Casino, MD, [place]  Attending Cardiologist  Direct Dial: [phone]  Fax: [phone]  Website:  [email or web address] 12/05/2016, 11:49 AM

## 2016-12-05 NOTE — Anesthesia Postprocedure Evaluation (Signed)
Anesthesia Post Note  Patient: Mark Singh  Procedure(s) Performed: Procedure(s) (LRB): CARDIOVERSION (N/A)     Patient location during evaluation: PACU Anesthesia Type: General Level of consciousness: awake and alert Pain management: pain level controlled Vital Signs Assessment: post-procedure vital signs reviewed and stable Respiratory status: spontaneous breathing, nonlabored ventilation, respiratory function stable and patient connected to nasal cannula oxygen Cardiovascular status: blood pressure returned to baseline and stable Postop Assessment: no signs of nausea or vomiting Anesthetic complications: no    Last Vitals:  Vitals:   12/05/16 1320 12/05/16 1335  BP: (!) 147/78 (!) 148/77  Pulse: 66   Resp: (!) 23 (!) 25  Temp:      Last Pain:  Vitals:   12/05/16 1210  TempSrc: Oral                 Ryan P Ellender

## 2016-12-05 NOTE — Discharge Instructions (Signed)
Electrical Cardioversion, Care After °This sheet gives you information about how to care for yourself after your procedure. Your health care provider may also give you more specific instructions. If you have problems or questions, contact your health care provider. °What can I expect after the procedure? °After the procedure, it is common to have: °· Some redness on the skin where the shocks were given. ° °Follow these instructions at home: °· Do not drive for 24 hours if you were given a medicine to help you relax (sedative). °· Take over-the-counter and prescription medicines only as told by your health care provider. °· Ask your health care provider how to check your pulse. Check it often. °· Rest for 48 hours after the procedure or as told by your health care provider. °· Avoid or limit your caffeine use as told by your health care provider. °Contact a health care provider if: °· You feel like your heart is beating too quickly or your pulse is not regular. °· You have a serious muscle cramp that does not go away. °Get help right away if: °· You have discomfort in your chest. °· You are dizzy or you feel faint. °· You have trouble breathing or you are short of breath. °· Your speech is slurred. °· You have trouble moving an arm or leg on one side of your body. °· Your fingers or toes turn cold or blue. °This information is not intended to replace advice given to you by your health care provider. Make sure you discuss any questions you have with your health care provider. °Document Released: 02/06/2013 Document Revised: 11/20/2015 Document Reviewed: 10/23/2015 °Elsevier Interactive Patient Education © 2018 [employer] ° °

## 2016-12-06 ENCOUNTER — Encounter (HOSPITAL_COMMUNITY): Payer: Self-pay | Admitting: Internal Medicine

## 2016-12-16 NOTE — Progress Notes (Addendum)
Cardiology Office Note Date:  12/20/2016  Patient ID:  Mark Singh, DOB 29-Jan-1946, MRN [id number] PCP:  Kathyrn Lass, MD  Cardiologist:  Dr. Marlou Porch Electrophysiologist: [name]   Chief Complaint: s/p DCCV  History of Present Illness: Mark Singh is a 71 y.o. male with history of PVC's, bradycardia, AFib, nephrotic syndrome (complete responder to steroid therapy by Dr. Lorrene Reid), DM, HTN, HLD, obesity, orthostatic hypotension, and mild pulm HTN felt 2/2 obesity.  He comes in today to be seen for [name], last seen by him in May 2017.  He was in AFib at that time and planned for DCCV to evaluate symptoms/lack of symptoms asymptomatic, started on Flecainide then.  Earlier this month saw Dr. Marlou Porch found in AFlutter, his Flecainide was increased and planned for DCCV which he had done 12/05/16.  Today he feels well.  She feels like she is more tired then baseline when in AF, otherwise no overt symptoms, no heart awareness, palpitations, SOB.  She denies any CP, no near syncope or syncope.  She will occassionally feel fleetingly lightheaded upon standing, rarely will feel lightheaded otherwise, once while walking felt like she needed to stop to "gather myself", was fast, but lightheaded.  Her home HR has been by her watch, 50's-70's.  She drinks a couple glasses of water a day and a coke.  Denies any bleeding or signs of bleeding.  She is currently taking 1/2 tab Toprol 25mg  daily.   AFib Hx: Found Feb 2017, incidentally noted, asymptomatic, started on a/c DCCV 07/22/15, successful AAD tx: Flecainide May 2017 DCCV 10/06/15, successful 12/01/16 found in typical AFlutter (asymptomatic), flecainide up-titrated 12/05/16: DCCV, successful   Past Medical History:  Diagnosis Date  . Arthritis   . Asthma   . Back pain   . Bradycardia    a. Repoted h/o HR in the 40s.  . Edema    a. 2D echo 11/2013: EF 46-56%, LV diastolic function parameters were normal, severely dilated LA,  PASP 31mmHg..  . H/O: gout    right foot  . HTN (hypertension)   . Hyperlipidemia   . Myalgia and myositis, unspecified   . Nephrotic syndrome    a. responder to steroid therapy by Dr. Lorrene Reid.   . Obesity   . Orthostatic hypotension   . Penile neoplasm   . Pulmonary hypertension ([place])    a. Mild pulm HTN felt 2/2 obesity.  [name] PVC's (premature ventricular contractions)    a. Holter 05/2013 showing 18k PVCs (18% of the time).  . Seasonal asthma    mild  . Shoulder pain   . Type 2 diabetes mellitus ([place])     Past Surgical History:  Procedure Laterality Date  . CARDIOVERSION N/A 07/22/2015   Procedure: CARDIOVERSION;  Surgeon: Dorothy Spark, MD;  Location: [facility];  Service: Cardiovascular;  Laterality: N/A;  . CARDIOVERSION N/A 10/06/2015   Procedure: CARDIOVERSION;  Surgeon: Sueanne Margarita, MD;  Location: [facility] ENDOSCOPY;  Service: Cardiovascular;  Laterality: N/A;  . CARDIOVERSION N/A 12/05/2016   Procedure: CARDIOVERSION;  Surgeon: Pixie Casino, MD;  Location: [facility] ENDOSCOPY;  Service: Cardiovascular;  Laterality: N/A;  . CIRCUMCISION N/A 11/27/2012   Procedure:  CIRCUMCISION  AND EXCISION OF GLANS PENIS;  Surgeon: Fredricka Bonine, MD;  Location: [facility];  Service: Urology;  Laterality: N/A;  . ELECTROPHYSIOLOGIC STUDY N/A 03/30/2015   Procedure: PVC Ablation;  Surgeon: [name], MD;  Location: [facility] CV LAB;  Service: Cardiovascular;  Laterality:  N/A;  . HAMMER TOE SURGERY    . LEFT FOOT SURGERY  1992  &  2008   REMOVAL HEAL SPUR/ BUNIONECTOMY AND HAMMERTOE REPAIR X2  . UMBILICAL HERNIA REPAIR  11-04-2005    Current Outpatient Prescriptions  Medication Sig Dispense Refill  . Cholecalciferol (VITAMIN D3) 5000 units CAPS Take 5,000 Units by mouth daily.     . flecainide (TAMBOCOR) 100 MG tablet Take 1 tablet (100 mg total) by mouth 2 (two) times daily. 60 tablet 6  . Fluticasone-Salmeterol (ADVAIR DISKUS) 100-50 MCG/DOSE AEPB Inhale  1 puff into the lungs 2 (two) times daily as needed (shortness of breath).     . metFORMIN (GLUCOPHAGE) 500 MG tablet Take 500 mg by mouth 2 (two) times daily with a meal.    . metoprolol succinate (TOPROL-XL) 25 MG 24 hr tablet TAKE 1 TABLET (25 MG TOTAL) BY MOUTH DAILY WITH SUPPER. 30 tablet 7  . pimecrolimus (ELIDEL) 1 % cream Apply 1 application topically 2 (two) times daily as needed (psoriasis).     . probenecid (BENEMID) 500 MG tablet Take 500 mg by mouth 2 (two) times daily.     . simvastatin (ZOCOR) 40 MG tablet Take 40 mg by mouth daily with supper.     . tamsulosin (FLOMAX) 0.4 MG CAPS capsule Take 0.4 mg by mouth daily.  4  . vitamin B-12 (CYANOCOBALAMIN) 1000 MCG tablet Take 1,000 mcg by mouth daily.    Alveda Reasons 20 MG TABS tablet TAKE 1 TABLET (20 MG TOTAL) BY MOUTH DAILY WITH SUPPER. 30 tablet 5   No current facility-administered medications for this visit.     Allergies:   Indomethacin   Social History:  The patient  reports that he has never smoked. He has never used smokeless tobacco. He reports that he does not drink alcohol or use drugs.   Family History:  The patient's family history includes CVA in his father; Coronary artery disease in his mother.  ROS:  Please see the history of present illness.  All other systems are reviewed and otherwise negative.   PHYSICAL EXAM:  VS:  BP (!) 152/80   Pulse (!) 52   Ht 5\' 9"  (1.753 m)   Wt 277 lb (125.6 kg)   BMI 40.91 kg/m  BMI: Body mass index is 40.91 kg/m. Well nourished, well developed, in no acute distress  HEENT: normocephalic, atraumatic  Neck: no JVD, carotid bruits or masses Cardiac:  RRR; no significant murmurs, no rubs, or gallops Lungs:  CTA b/l, no wheezing, rhonchi or rales  Abd: soft, nontender MS: no deformity or atrophy Ext:  no edema,  2+ pedal pulses b/l Skin: warm and dry, no rash Neuro:  No gross deficits appreciated Psych: euthymic mood, full affect  EKG:  Done today and reviewed by myself:  SB, 52bp, borderline 1st degree AVBlock, PR 245ms, QRS 170ms, QTc 384ms, intervals are stable in comparison to prior SR EKG  10/20/15: EST  There was no ST segment deviation noted during stress.  There were no adverse arrhythmias during exercise. QRS did not widen. Isolated PVC noted  Low risk flecainide exercise challenge  03/30/15: PROCEDURE: 1. Comprehensive EP study. 2. Left atrial and left ventricular pacing and recording. 3. A 3D mapping of PVCs. 4. Radiofrequency ablation of PVC. 5. Arterial blood pressure monitoring. 6. Intracardiac echocardiography  02/04/15: 48 hour holter   53000 PVC's   Rare ventricular triplet   Ventricular bigeminy 990   Longest pause 2.2 seconds   Mean  heart rate 78bpm, lowest 39bpm at 4:18am. With frequent PVCs, 53,000 total, please have electrophysiology evaluate. [name], MD  12/27/13: Echocardiogram Study Conclusions - Left ventricle: The cavity size was normal. There was mild concentric hypertrophy. Systolic function was normal. The estimated ejection fraction was in the range of 55% to 60%. Although no diagnostic regional wall motion abnormality was identified, this possibility cannot be completely excluded on the basis of this study. Left ventricular diastolic function parameters were normal. - Left atrium: The atrium was severely dilated. - Pulmonary arteries: Systolic pressure was mildly increased. PA peak pressure: 38 mm Hg (S).  Recent Labs: 12/01/2016: BUN 12; Creatinine, Ser 1.22; Hemoglobin 14.3; Platelets 332; Potassium 4.4; Sodium 140   Wt Readings from Last 3 Encounters:  12/20/16 277 lb (125.6 kg)  12/05/16 277 lb (125.6 kg)  12/01/16 277 lb 6.4 oz (125.8 kg)     Other studies reviewed: Additional studies/records reviewed today include: summarized above   ASSESSMENT AND PLAN:  1. PVC's S/p ablation 03/30/15 with [name]  2. Bradycardia Symptoms generally sound of orthostatic  etiology, poor water intake Counseled on importance of adequate hydration  Once while ambulating felt lightheaded, will have her wear a monitor   3. Persistent AFib, Aflutter     CHA2DS2Vasc is at least 2, on Xarelto (appropriately dosed for Cal Cr cl of 99)     Subtle symptoms of perhaps fatigue     Will try to maintain SR     On Flecainide/metoprolol  Disposition: event monitor, ROC 6 weeks, if monitor looks OK, and feeling well can move out to 4 months,  sooner if needed  Current medicines are reviewed at length with the patient today.  The patient did not have any concerns regarding medicines.  Haywood Lasso, PA-C 12/20/2016 5:39 PM     [address] [place] [address] [phone] (office)  [phone] (fax)

## 2016-12-20 ENCOUNTER — Other Ambulatory Visit: Payer: Self-pay | Admitting: Physician Assistant

## 2016-12-20 ENCOUNTER — Ambulatory Visit (INDEPENDENT_AMBULATORY_CARE_PROVIDER_SITE_OTHER): Payer: Medicare Other

## 2016-12-20 ENCOUNTER — Ambulatory Visit (INDEPENDENT_AMBULATORY_CARE_PROVIDER_SITE_OTHER): Payer: Medicare Other | Admitting: Physician Assistant

## 2016-12-20 VITALS — BP 152/80 | HR 52 | Ht 69.0 in | Wt 277.0 lb

## 2016-12-20 DIAGNOSIS — I4819 Other persistent atrial fibrillation: Secondary | ICD-10-CM

## 2016-12-20 DIAGNOSIS — R42 Dizziness and giddiness: Secondary | ICD-10-CM

## 2016-12-20 DIAGNOSIS — I493 Ventricular premature depolarization: Secondary | ICD-10-CM

## 2016-12-20 DIAGNOSIS — I481 Persistent atrial fibrillation: Secondary | ICD-10-CM

## 2016-12-20 DIAGNOSIS — I4891 Unspecified atrial fibrillation: Secondary | ICD-10-CM

## 2016-12-20 NOTE — Patient Instructions (Addendum)
Medication Instructions:   Your physician recommends that you continue on your current medications as directed. Please refer to the Current Medication list given to you today.   If you need a refill on your cardiac medications before your next appointment, please call your pharmacy.  Labwork: .NONE ORDERED  TODAY     Testing/Procedures: Your physician has recommended that you wear an event monitor. Event monitors are medical devices that record the heart's electrical activity. Doctors most often [place] these monitors to diagnose arrhythmias. Arrhythmias are problems with the speed or rhythm of the heartbeat. The monitor is a small, portable device. You can wear one while you do your normal daily activities. This is usually used to diagnose what is causing palpitations/syncope (passing out).     Follow-Up: 6 WEEKS WITH DR CAMNITZ    Any Other Special Instructions Will Be Listed Below (If Applicable).

## 2016-12-27 ENCOUNTER — Telehealth: Payer: Self-pay | Admitting: *Deleted

## 2016-12-27 NOTE — Telephone Encounter (Signed)
[employer] faxed a critical event recording on this pt from 8/27 at 7:51 PM (CT).  Pts event recording showed that this was an "auto triggered event," and the pt was in atrial flutter with variable conduction, at a HR of 90 BPM.  Contacted the pt today and asked if he had any symptoms that correlated with this recording, and pt stated that he was asymptomatic at the time of this recorded event.  Pt states he had no cardiac complaints at all yesterday, and his BP and HR was WNL.  Informed the pt that I will make Dr Curt Bears and his RN aware of this recorded event with NO symptoms.  Advised the pt to continue his current med regimen, unless otherwise advised by Dr Curt Bears and RN.  Pt verbalized understanding and agrees with this plan.

## 2016-12-27 NOTE — Telephone Encounter (Signed)
Showed Dr Baird Kay the pts critical event monitor recording.  Dr Curt Bears signed and no changes were made.  Pt is aware that he will only receive a call back if further med changes need to be made.

## 2017-02-01 ENCOUNTER — Ambulatory Visit: Payer: Medicare Other | Admitting: Cardiology

## 2017-02-02 ENCOUNTER — Ambulatory Visit (INDEPENDENT_AMBULATORY_CARE_PROVIDER_SITE_OTHER): Payer: Medicare Other | Admitting: Cardiology

## 2017-02-02 ENCOUNTER — Encounter: Payer: Self-pay | Admitting: Cardiology

## 2017-02-02 VITALS — BP 128/72 | HR 69 | Ht 69.0 in | Wt 276.2 lb

## 2017-02-02 DIAGNOSIS — I4819 Other persistent atrial fibrillation: Secondary | ICD-10-CM

## 2017-02-02 DIAGNOSIS — Z23 Encounter for immunization: Secondary | ICD-10-CM | POA: Diagnosis not present

## 2017-02-02 DIAGNOSIS — I481 Persistent atrial fibrillation: Secondary | ICD-10-CM

## 2017-02-02 DIAGNOSIS — I493 Ventricular premature depolarization: Secondary | ICD-10-CM | POA: Diagnosis not present

## 2017-02-02 NOTE — Progress Notes (Signed)
Electrophysiology Office Note   Date:  02/02/2017   ID:  Mark Singh, DOB [birth year], MRN [id number]  PCP:  Kathyrn Lass, MD  Cardiologist:  Marlou Porch Primary Electrophysiologist:  Mark Doxtater Meredith Leeds, MD    Chief Complaint  Patient presents with  . Follow-up    Persistent Afib/PVC's     History of Present Illness: Mark Singh is a 71 y.o. male who presents today for electrophysiology evaluation.   history of PVC's, bradycardia, nephrotic syndrome (complete responder to steroid therapy by Dr. Lorrene Reid), DM, HTN, HLD, obesity, orthostatic hypotension, and mild pulm HTN felt 2/2 obesity Comes in today now s/p ablation procedure for his PVC's.  PVCs were mapped to inferior to the [facility] and successfully ablated on 03/30/15.  Today, denies symptoms of palpitations, chest pain, shortness of breath, orthopnea, PND, lower extremity edema, claudication, dizziness, presyncope, syncope, bleeding, or neurologic sequela. The patient is tolerating medications without difficulties. He has continued to feel well though he is in atrial fibrillation today. He has not had shortness of breath, palpitations, or fatigue. He is planning on going back to exercising.    Past Medical History:  Diagnosis Date  . Arthritis   . Asthma   . Back pain   . Bradycardia    a. Repoted h/o HR in the 40s.  . Edema    a. 2D echo 11/2013: EF 40-98%, LV diastolic function parameters were normal, severely dilated LA, PASP 49mmHg..  . H/O: gout    right foot  . HTN (hypertension)   . Hyperlipidemia   . Myalgia and myositis, unspecified   . Nephrotic syndrome    a. responder to steroid therapy by Dr. Lorrene Reid.   . Obesity   . Orthostatic hypotension   . Penile neoplasm   . Pulmonary hypertension ([place])    a. Mild pulm HTN felt 2/2 obesity.  [name] PVC's (premature ventricular contractions)    a. Holter 05/2013 showing 18k PVCs (18% of the time).  . Seasonal asthma    mild  . Shoulder pain   . Type 2  diabetes mellitus ([place])    Past Surgical History:  Procedure Laterality Date  . CARDIOVERSION N/A 07/22/2015   Procedure: CARDIOVERSION;  Surgeon: Dorothy Spark, MD;  Location: [place];  Service: Cardiovascular;  Laterality: N/A;  . CARDIOVERSION N/A 10/06/2015   Procedure: CARDIOVERSION;  Surgeon: Sueanne Margarita, MD;  Location: [facility] ENDOSCOPY;  Service: Cardiovascular;  Laterality: N/A;  . CARDIOVERSION N/A 12/05/2016   Procedure: CARDIOVERSION;  Surgeon: Pixie Casino, MD;  Location: [facility] ENDOSCOPY;  Service: Cardiovascular;  Laterality: N/A;  . CIRCUMCISION N/A 11/27/2012   Procedure:  CIRCUMCISION  AND EXCISION OF GLANS PENIS;  Surgeon: Fredricka Bonine, MD;  Location: [facility];  Service: Urology;  Laterality: N/A;  . ELECTROPHYSIOLOGIC STUDY N/A 03/30/2015   Procedure: PVC Ablation;  Surgeon: Brinton Brandel Meredith Leeds, MD;  Location: [place] CV LAB;  Service: Cardiovascular;  Laterality: N/A;  . HAMMER TOE SURGERY    . LEFT FOOT SURGERY  1992  &  2008   REMOVAL HEAL SPUR/ BUNIONECTOMY AND HAMMERTOE REPAIR X2  . UMBILICAL HERNIA REPAIR  11-04-2005     Current Outpatient Prescriptions  Medication Sig Dispense Refill  . Cholecalciferol (VITAMIN D3) 5000 units CAPS Take 5,000 Units by mouth daily.     . flecainide (TAMBOCOR) 100 MG tablet Take 1 tablet (100 mg total) by mouth 2 (two) times daily. 60 tablet 6  . Fluticasone-Salmeterol (ADVAIR DISKUS) 100-50 MCG/DOSE  AEPB Inhale 1 puff into the lungs 2 (two) times daily as needed (shortness of breath).     . metFORMIN (GLUCOPHAGE) 500 MG tablet Take 500 mg by mouth 2 (two) times daily with a meal.    . metoprolol succinate (TOPROL-XL) 25 MG 24 hr tablet TAKE 1 TABLET (25 MG TOTAL) BY MOUTH DAILY WITH SUPPER. 30 tablet 7  . pimecrolimus (ELIDEL) 1 % cream Apply 1 application topically 2 (two) times daily as needed (psoriasis).     . probenecid (BENEMID) 500 MG tablet Take 500 mg by mouth 2 (two) times daily.     .  simvastatin (ZOCOR) 40 MG tablet Take 40 mg by mouth daily with supper.     . tamsulosin (FLOMAX) 0.4 MG CAPS capsule Take 0.4 mg by mouth daily.  4  . vitamin B-12 (CYANOCOBALAMIN) 1000 MCG tablet Take 1,000 mcg by mouth daily.    Alveda Reasons 20 MG TABS tablet TAKE 1 TABLET (20 MG TOTAL) BY MOUTH DAILY WITH SUPPER. 30 tablet 5  . omeprazole (PRILOSEC) 20 MG capsule Take 20 mg by mouth daily.  0   No current facility-administered medications for this visit.     Allergies:   Indomethacin   Social History:  The patient  reports that he has never smoked. He has never used smokeless tobacco. He reports that he does not drink alcohol or use drugs.   Family History:  The patient's family history includes CVA in his father; Coronary artery disease in his mother.    ROS:  Please see the history of present illness.   Otherwise, review of systems is positive for none.   All other systems are reviewed and negative.   PHYSICAL EXAM: VS:  BP 128/72   Pulse 69   Ht 5\' 9"  (1.753 m)   Wt 276 lb 3.2 oz (125.3 kg)   BMI 40.79 kg/m  , BMI Body mass index is 40.79 kg/m. GEN: Well nourished, well developed, in no acute distress  HEENT: normal  Neck: no JVD, carotid bruits, or masses Cardiac: RRR; no murmurs, rubs, or gallops,no edema  Respiratory:  clear to auscultation bilaterally, normal work of breathing GI: soft, nontender, nondistended, + BS MS: no deformity or atrophy  Skin: warm and dry Neuro:  Strength and sensation are intact Psych: euthymic mood, full affect  EKG:  EKG is ordered today. Personal review of the ekg ordered shows atrial fibrillation, rate 69, PVCs   Recent Labs: 12/01/2016: BUN 12; Creatinine, Ser 1.22; Hemoglobin 14.3; Platelets 332; Potassium 4.4; Sodium 140    Lipid Panel  No results found for: CHOL, TRIG, HDL, CHOLHDL, VLDL, LDLCALC, LDLDIRECT   Wt Readings from Last 3 Encounters:  02/02/17 276 lb 3.2 oz (125.3 kg)  12/20/16 277 lb (125.6 kg)  12/05/16 277 lb  (125.6 kg)      Other studies Reviewed: Additional studies/ records that were reviewed today include: TTE 12/28/14  Review of the above records today demonstrates:  - Left ventricle: The cavity size was normal. There was mild concentric hypertrophy. Systolic function was normal. The estimated ejection fraction was in the range of 55% to 60%. Although no diagnostic regional wall motion abnormality was identified, this possibility cannot be completely excluded on the basis of this study. Left ventricular diastolic function parameters were normal. - Left atrium: The atrium was severely dilated. - Pulmonary arteries: Systolic pressure was mildly increased. PA peak pressure: 38 mm Hg (S).   ASSESSMENT AND PLAN:  1.  PVCs: Status post  ablation in November 2016 with good results. Continue current management.  2. Atrial fibrillation: Is currently on Xarelto. Has not had many symptoms. Wore cardiac monitor with not much in the way of sinus rhythm mostly atrial fibrillation. We'll plan to stop flecainide today as he is asymptomatic.  This patients CHA2DS2-VASc Score and unadjusted Ischemic Stroke Rate (% per year) is equal to 3.2 % stroke rate/year from a score of 3  Above score calculated as 1 point each if present [CHF, HTN, DM, Vascular=MI/PAD/Aortic Plaque, Age if 65-74, or Male] Above score calculated as 2 points each if present [Age > 75, or Stroke/TIA/TE]   Current medicines are reviewed at length with the patient today.   The patient does not have concerns regarding his medicines.  The following changes were made today:  Xarelto  Labs/ tests ordered today include:  No orders of the defined types were placed in this encounter.    Disposition:   FU with Yitzchok Carriger 6 months  Signed, Wynton Hufstetler Meredith Leeds, MD  02/02/2017 9:06 AM     [facility] [address] [place] [address] [phone] (office) [phone] (fax)

## 2017-02-02 NOTE — Patient Instructions (Signed)
Medication Instructions:  Your physician has recommended you make the following change in your medication:  1.)  Stop Flecainide   Labwork: None ordered   Testing/Procedures: None ordered   Follow-Up: Your physician wants you to follow-up in: 6 months with [name]. You will receive a reminder letter in the mail two months in advance. If you don't receive a letter, please call our office to schedule the follow-up appointment.   Any Other Special Instructions Will Be Listed Below (If Applicable).     If you need a refill on your cardiac medications before your next appointment, please call your pharmacy.

## 2017-03-16 ENCOUNTER — Other Ambulatory Visit: Payer: Self-pay | Admitting: Cardiology

## 2017-03-16 NOTE — Telephone Encounter (Signed)
Age 71 years  Wt 34.3kg Saw  Dr Curt Bears 02/02/2017  12/01/2016 Sr Cr 1.22 12/01/2016 Hgb 14.3  HCT 41.1 CrCl  96.99 Refill done for Xarelto 20 mg daily as requested

## 2017-05-11 ENCOUNTER — Other Ambulatory Visit: Payer: Self-pay | Admitting: Podiatry

## 2017-05-11 ENCOUNTER — Ambulatory Visit (INDEPENDENT_AMBULATORY_CARE_PROVIDER_SITE_OTHER): Payer: Medicare Other

## 2017-05-11 ENCOUNTER — Ambulatory Visit: Payer: Medicare Other | Admitting: Podiatry

## 2017-05-11 ENCOUNTER — Encounter: Payer: Self-pay | Admitting: Podiatry

## 2017-05-11 DIAGNOSIS — E1140 Type 2 diabetes mellitus with diabetic neuropathy, unspecified: Secondary | ICD-10-CM

## 2017-05-11 DIAGNOSIS — E1149 Type 2 diabetes mellitus with other diabetic neurological complication: Secondary | ICD-10-CM

## 2017-05-11 DIAGNOSIS — B351 Tinea unguium: Secondary | ICD-10-CM

## 2017-05-11 DIAGNOSIS — M79671 Pain in right foot: Secondary | ICD-10-CM

## 2017-05-11 DIAGNOSIS — M79672 Pain in left foot: Secondary | ICD-10-CM

## 2017-05-11 DIAGNOSIS — M79674 Pain in right toe(s): Secondary | ICD-10-CM

## 2017-05-11 DIAGNOSIS — M779 Enthesopathy, unspecified: Secondary | ICD-10-CM

## 2017-05-11 DIAGNOSIS — M79675 Pain in left toe(s): Secondary | ICD-10-CM

## 2017-05-11 MED ORDER — TRIAMCINOLONE ACETONIDE 10 MG/ML IJ SUSP
10.0000 mg | Freq: Once | INTRAMUSCULAR | Status: AC
  Administered 2017-05-11: 10 mg

## 2017-05-11 NOTE — Progress Notes (Signed)
Subjective:   Patient ID: Mark Singh, male   DOB: 72 y.o.   MRN: [id number]   HPI Patient presents stating the top of my right foot has been hurting a lot and my nails are severely elongated thickened and I cannot cut them and they are painful   ROS      Objective:  Physical Exam  Neurovascular status intact mycotic nail infection 1-5 both feet with tendinitis of the dorsum of the right foot around the midtarsal joint     Assessment:  Midfoot tendinitis with arthritic symptoms with severe nail disease 1-5 both feet with yellow mycotic infection and pain     Plan:  H&P x-rays reviewed and today I injected the midtarsal joint right 3 mg Kenalog 5 mg Xylocaine and went ahead today and I debrided nailbeds 1-5 both feet with no iatrogenic bleeding noted  X-rays indicate there is spurring of the dorsum of the foot bilateral with no indications of fracture

## 2017-07-21 ENCOUNTER — Encounter: Payer: Self-pay | Admitting: Cardiology

## 2017-08-01 ENCOUNTER — Ambulatory Visit: Payer: Medicare Other | Admitting: Cardiology

## 2017-08-01 ENCOUNTER — Encounter: Payer: Self-pay | Admitting: Cardiology

## 2017-08-01 VITALS — BP 140/88 | HR 102 | Ht 69.0 in | Wt 266.0 lb

## 2017-08-01 DIAGNOSIS — I481 Persistent atrial fibrillation: Secondary | ICD-10-CM

## 2017-08-01 DIAGNOSIS — I493 Ventricular premature depolarization: Secondary | ICD-10-CM | POA: Diagnosis not present

## 2017-08-01 DIAGNOSIS — I4819 Other persistent atrial fibrillation: Secondary | ICD-10-CM

## 2017-08-01 MED ORDER — METOPROLOL SUCCINATE ER 100 MG PO TB24: 100.0000 mg | ORAL_TABLET | Freq: Every day | ORAL | 3 refills | Status: DC

## 2017-08-01 NOTE — Patient Instructions (Addendum)
Medication Instructions:  Your physician has recommended you make the following change in your medication:  1. INCREASE Toprol to 100 mg daily  Labwork: None ordered  Testing/Procedures: None ordered  Follow-Up: Your physician recommends that you schedule a follow-up appointment in: 3 months with [name].   * If you need a refill on your cardiac medications before your next appointment, please call your pharmacy.   *Please note that any paperwork needing to be filled out by the provider will need to be addressed at the front desk prior to seeing the provider. Please note that any FMLA, disability or other documents regarding health condition is subject to a $25.00 charge that must be received prior to completion of paperwork in the form of a money order or check.  Thank you for choosing [facility]!!   [name], RN [phone]

## 2017-08-01 NOTE — Progress Notes (Signed)
Electrophysiology Office Note   Date:  08/01/2017   ID:  Mark Singh, DOB Feb 19, 1946, MRN [id number]  PCP:  Kathyrn Lass, MD  Cardiologist:  Marlou Porch Primary Electrophysiologist:  Jetaun Colbath Meredith Leeds, MD    Chief Complaint  Patient presents with  . Follow-up    Persistent Afib/PVC's     History of Present Illness: Mark Singh is a 72 y.o. male who presents today for electrophysiology evaluation.   history of PVC's, bradycardia, nephrotic syndrome (complete responder to steroid therapy by Dr. Lorrene Reid), DM, HTN, HLD, obesity, orthostatic hypotension, and mild pulm HTN felt 2/2 obesity Comes in today now s/p ablation procedure for his PVC's.  PVCs were mapped to inferior to the [facility] and successfully ablated on 03/30/15.  Today, denies symptoms of palpitations, chest pain, shortness of breath, orthopnea, PND, lower extremity edema, claudication, dizziness, presyncope, syncope, bleeding, or neurologic sequela. The patient is tolerating medications without difficulties.  Overall he is feeling well.  He does note that his heart rate goes into the 140s-160s with minimal exertion.  He has some shortness of breath and dizziness at times.  He feels that this may be due to atrial fibrillation.   Past Medical History:  Diagnosis Date  . Arthritis   . Asthma   . Back pain   . Bradycardia    a. Repoted h/o HR in the 40s.  . Edema    a. 2D echo 11/2013: EF 16-60%, LV diastolic function parameters were normal, severely dilated LA, PASP 25mmHg..  . H/O: gout    right foot  . HTN (hypertension)   . Hyperlipidemia   . Myalgia and myositis, unspecified   . Nephrotic syndrome    a. responder to steroid therapy by Dr. Lorrene Reid.   . Obesity   . Orthostatic hypotension   . Penile neoplasm   . Pulmonary hypertension ([place])    a. Mild pulm HTN felt 2/2 obesity.  [name] PVC's (premature ventricular contractions)    a. Holter 05/2013 showing 18k PVCs (18% of the time).  . Seasonal asthma      mild  . Shoulder pain   . Type 2 diabetes mellitus ([place])    Past Surgical History:  Procedure Laterality Date  . CARDIOVERSION N/A 07/22/2015   Procedure: CARDIOVERSION;  Surgeon: Dorothy Spark, MD;  Location: [place];  Service: Cardiovascular;  Laterality: N/A;  . CARDIOVERSION N/A 10/06/2015   Procedure: CARDIOVERSION;  Surgeon: Sueanne Margarita, MD;  Location: [facility] ENDOSCOPY;  Service: Cardiovascular;  Laterality: N/A;  . CARDIOVERSION N/A 12/05/2016   Procedure: CARDIOVERSION;  Surgeon: Pixie Casino, MD;  Location: [facility] ENDOSCOPY;  Service: Cardiovascular;  Laterality: N/A;  . CIRCUMCISION N/A 11/27/2012   Procedure:  CIRCUMCISION  AND EXCISION OF GLANS PENIS;  Surgeon: Fredricka Bonine, MD;  Location: [facility];  Service: Urology;  Laterality: N/A;  . ELECTROPHYSIOLOGIC STUDY N/A 03/30/2015   Procedure: PVC Ablation;  Surgeon: Marquise Lambson Meredith Leeds, MD;  Location: [place] CV LAB;  Service: Cardiovascular;  Laterality: N/A;  . HAMMER TOE SURGERY    . LEFT FOOT SURGERY  1992  &  2008   REMOVAL HEAL SPUR/ BUNIONECTOMY AND HAMMERTOE REPAIR X2  . UMBILICAL HERNIA REPAIR  11-04-2005     Current Outpatient Medications  Medication Sig Dispense Refill  . Cholecalciferol (VITAMIN D3) 5000 units CAPS Take 5,000 Units by mouth daily.     . Fluticasone-Salmeterol (ADVAIR DISKUS) 100-50 MCG/DOSE AEPB Inhale 1 puff into the lungs 2 (two) times  daily as needed (shortness of breath).     . metFORMIN (GLUCOPHAGE) 1000 MG tablet Take 1,000 mg by mouth 2 (two) times daily.  0  . metoprolol succinate (TOPROL-XL) 25 MG 24 hr tablet TAKE 1 TABLET (25 MG TOTAL) BY MOUTH DAILY WITH SUPPER. 30 tablet 7  . omeprazole (PRILOSEC) 20 MG capsule Take 20 mg by mouth daily.  0  . pimecrolimus (ELIDEL) 1 % cream Apply 1 application topically 2 (two) times daily as needed (psoriasis).     . probenecid (BENEMID) 500 MG tablet Take 500 mg by mouth 2 (two) times daily.     . simvastatin  (ZOCOR) 40 MG tablet Take 40 mg by mouth daily with supper.     . tamsulosin (FLOMAX) 0.4 MG CAPS capsule Take 0.4 mg by mouth daily.  4  . vitamin B-12 (CYANOCOBALAMIN) 1000 MCG tablet Take 1,000 mcg by mouth daily.    Alveda Reasons 20 MG TABS tablet TAKE 1 TABLET (20 MG TOTAL) BY MOUTH DAILY WITH SUPPER. 30 tablet 5   No current facility-administered medications for this visit.     Allergies:   Indomethacin   Social History:  The patient  reports that he has never smoked. He has never used smokeless tobacco. He reports that he does not drink alcohol or use drugs.   Family History:  The patient's family history includes CVA in his father; Coronary artery disease in his mother.   ROS:  Please see the history of present illness.   Otherwise, review of systems is positive for SOB.   All other systems are reviewed and negative.   PHYSICAL EXAM: VS:  BP 140/88   Pulse (!) 102   Ht 5\' 9"  (1.753 m)   Wt 266 lb (120.7 kg)   SpO2 99%   BMI 39.28 kg/m  , BMI Body mass index is 39.28 kg/m. GEN: Well nourished, well developed, in no acute distress  HEENT: normal  Neck: no JVD, carotid bruits, or masses Cardiac: iRRR; no murmurs, rubs, or gallops,no edema  Respiratory:  clear to auscultation bilaterally, normal work of breathing GI: soft, nontender, nondistended, + BS MS: no deformity or atrophy  Skin: warm and dry Neuro:  Strength and sensation are intact Psych: euthymic mood, full affect  EKG:  EKG is ordered today. Personal review of the ekg ordered shows atrial fibrillation, rate 102   Recent Labs: 12/01/2016: BUN 12; Creatinine, Ser 1.22; Hemoglobin 14.3; Platelets 332; Potassium 4.4; Sodium 140    Lipid Panel  No results found for: CHOL, TRIG, HDL, CHOLHDL, VLDL, LDLCALC, LDLDIRECT   Wt Readings from Last 3 Encounters:  08/01/17 266 lb (120.7 kg)  02/02/17 276 lb 3.2 oz (125.3 kg)  12/20/16 277 lb (125.6 kg)      Other studies Reviewed: Additional studies/ records that were  reviewed today include: TTE 12/28/14  Review of the above records today demonstrates:  - Left ventricle: The cavity size was normal. There was mild concentric hypertrophy. Systolic function was normal. The estimated ejection fraction was in the range of 55% to 60%. Although no diagnostic regional wall motion abnormality was identified, this possibility cannot be completely excluded on the basis of this study. Left ventricular diastolic function parameters were normal. - Left atrium: The atrium was severely dilated. - Pulmonary arteries: Systolic pressure was mildly increased. PA peak pressure: 38 mm Hg (S).   ASSESSMENT AND PLAN:  1.  PVCs: Post ablation November 2016 with good results.  Continue with current management.  2.  Persistent atrial fibrillation: Currently on Xarelto.  Was taken off of flecainide at his last visit due to a lack of symptoms.  He is now complaining of some mild shortness of breath with elevated heart rates.  We Kathalina Ostermann increase his Toprol-XL to 100 mg.  I discussed with him the possibility of medical management for his atrial fibrillation for rhythm control.  He would like to consider this.  We Maryelizabeth Eberle discuss it again at the next visit.  This patients CHA2DS2-VASc Score and unadjusted Ischemic Stroke Rate (% per year) is equal to 3.2 % stroke rate/year from a score of 3  Above score calculated as 1 point each if present [CHF, HTN, DM, Vascular=MI/PAD/Aortic Plaque, Age if 65-74, or Male] Above score calculated as 2 points each if present [Age > 75, or Stroke/TIA/TE]   Current medicines are reviewed at length with the patient today.   The patient does not have concerns regarding his medicines.  The following changes were made today: Increase Toprol-XL  Labs/ tests ordered today include:  Orders Placed This Encounter  Procedures  . EKG 12-Lead     Disposition:   FU with Pennie Vanblarcom 3 months  Signed, Jaine Estabrooks Meredith Leeds, MD  08/01/2017 10:56  AM     [facility] [address] [place] [address] [phone] (office) [phone] (fax)

## 2017-08-30 ENCOUNTER — Encounter: Payer: Self-pay | Admitting: Skilled Nursing Facility1

## 2017-08-30 ENCOUNTER — Encounter: Payer: Medicare Other | Attending: Family Medicine | Admitting: Skilled Nursing Facility1

## 2017-08-30 DIAGNOSIS — E119 Type 2 diabetes mellitus without complications: Secondary | ICD-10-CM | POA: Diagnosis present

## 2017-08-30 DIAGNOSIS — Z713 Dietary counseling and surveillance: Secondary | ICD-10-CM | POA: Diagnosis not present

## 2017-08-30 NOTE — Progress Notes (Signed)
Pt states he has been started on lantus for the past monmth.  Fasting: 166,145, 168, 137, 151 after starting the lantus. Pts blood sugars get sent to his phone and he finds that very helpful.  Pt states his wife passed away last year and got teary. Diabetes Self-Management Education  Visit Type: First/Initial  08/30/2017  Mark Singh, identified by name and date of birth, is a 72 y.o. male with a diagnosis of Diabetes: Type 2.   ASSESSMENT  There were no vitals taken for this visit. There is no height or weight on file to calculate BMI.  Diabetes Self-Management Education - 08/30/17 1400      Visit Information   Visit Type  First/Initial      Initial Visit   Diabetes Type  Type 2    Are you currently following a meal plan?  No    Are you taking your medications as prescribed?  Yes      Psychosocial Assessment   Patient Belief/Attitude about Diabetes  Motivated to manage diabetes      Pre-Education Assessment   Patient understands the diabetes disease and treatment process.  Needs Instruction    Patient understands incorporating nutritional management into lifestyle.  Needs Instruction    Patient undertands incorporating physical activity into lifestyle.  Needs Instruction    Patient understands using medications safely.  Needs Instruction    Patient understands monitoring blood glucose, interpreting and using results  Needs Instruction    Patient understands prevention, detection, and treatment of acute complications.  Needs Instruction    Patient understands prevention, detection, and treatment of chronic complications.  Needs Instruction    Patient understands how to develop strategies to address psychosocial issues.  Needs Instruction    Patient understands how to develop strategies to promote health/change behavior.  Needs Instruction      Complications   Last HgB A1C per patient/outside source  10.7 %    How often do you check your blood sugar?  1-2 times/day     Fasting Blood glucose range (mg/dL)  130-179    Have you had a dilated eye exam in the past 12 months?  Yes    Have you had a dental exam in the past 12 months?  Yes    Are you checking your feet?  No      Dietary Intake   Breakfast  cereal and low fat milk    Snack (morning)  suagr free cookies    Lunch  tuna fish sandwich and orange or eating out    Snack (afternoon)  cookies and ice cream    Dinner  chicken breast and beans with spagetti and salad    Snack (evening)  low suagr ice cream and cookies    Beverage(s)  4 bottles water      Exercise   Exercise Type  Light (walking / raking leaves)    How many days per week to you exercise?  2    How many minutes per day do you exercise?  30    Total minutes per week of exercise  60      Patient Education   Disease state   Factors that contribute to the development of diabetes    Nutrition management   Role of diet in the treatment of diabetes and the relationship between the three main macronutrients and blood glucose level;Carbohydrate counting;Food label reading, portion sizes and measuring food.;Meal timing in regards to the patients' current diabetes medication.;Reviewed blood glucose goals  for pre and post meals and how to evaluate the patients' food intake on their blood glucose level.    Physical activity and exercise   Role of exercise on diabetes management, blood pressure control and cardiac health.;Identified with patient nutritional and/or medication changes necessary with exercise.    Monitoring  Yearly dilated eye exam;Daily foot exams    Acute complications  Taught treatment of hypoglycemia - the 15 rule.;Discussed and identified patients' treatment of hyperglycemia.    Chronic complications  Dental care;Assessed and discussed foot care and prevention of foot problems    Psychosocial adjustment  Role of stress on diabetes      Individualized Goals (developed by patient)   Nutrition  General guidelines for healthy choices  and portions discussed;Adjust meds/carbs with exercise as discussed    Physical Activity  Exercise 3-5 times per week;30 minutes per day      Post-Education Assessment   Patient understands the diabetes disease and treatment process.  Demonstrates understanding / competency    Patient understands incorporating nutritional management into lifestyle.  Demonstrates understanding / competency    Patient undertands incorporating physical activity into lifestyle.  Demonstrates understanding / competency    Patient understands using medications safely.  Demonstrates understanding / competency    Patient understands monitoring blood glucose, interpreting and using results  Demonstrates understanding / competency    Patient understands prevention, detection, and treatment of acute complications.  Demonstrates understanding / competency    Patient understands prevention, detection, and treatment of chronic complications.  Demonstrates understanding / competency    Patient understands how to develop strategies to address psychosocial issues.  Demonstrates understanding / competency    Patient understands how to develop strategies to promote health/change behavior.  Demonstrates understanding / competency      Outcomes   Expected Outcomes  Demonstrated interest in learning. Expect positive outcomes    Future DMSE  PRN    Program Status  Completed       Individualized Plan for Diabetes Self-Management Training:   Learning Objective:  Patient will have a greater understanding of diabetes self-management. Patient education plan is to attend individual and/or group sessions per assessed needs and concerns.   Plan:   There are no Patient Instructions on file for this visit.  Expected Outcomes:  Demonstrated interest in learning. Expect positive outcomes  Education material provided: Meal plan card, My Plate, Snack sheet and Support group flyer  If problems or questions, patient to contact team via:   Phone  Future DSME appointment: PRN

## 2017-09-08 ENCOUNTER — Other Ambulatory Visit: Payer: Self-pay | Admitting: Cardiology

## 2017-10-31 ENCOUNTER — Encounter: Payer: Medicare Other | Attending: Family Medicine | Admitting: Skilled Nursing Facility1

## 2017-10-31 ENCOUNTER — Encounter: Payer: Self-pay | Admitting: Cardiology

## 2017-10-31 ENCOUNTER — Ambulatory Visit: Payer: Medicare Other | Admitting: Cardiology

## 2017-10-31 ENCOUNTER — Encounter: Payer: Self-pay | Admitting: Skilled Nursing Facility1

## 2017-10-31 VITALS — BP 132/86 | HR 52 | Ht 69.0 in | Wt 247.4 lb

## 2017-10-31 DIAGNOSIS — E119 Type 2 diabetes mellitus without complications: Secondary | ICD-10-CM | POA: Insufficient documentation

## 2017-10-31 DIAGNOSIS — Z713 Dietary counseling and surveillance: Secondary | ICD-10-CM | POA: Diagnosis not present

## 2017-10-31 DIAGNOSIS — E7800 Pure hypercholesterolemia, unspecified: Secondary | ICD-10-CM | POA: Diagnosis not present

## 2017-10-31 DIAGNOSIS — I481 Persistent atrial fibrillation: Secondary | ICD-10-CM

## 2017-10-31 DIAGNOSIS — Z01812 Encounter for preprocedural laboratory examination: Secondary | ICD-10-CM | POA: Diagnosis not present

## 2017-10-31 DIAGNOSIS — I4819 Other persistent atrial fibrillation: Secondary | ICD-10-CM

## 2017-10-31 DIAGNOSIS — I493 Ventricular premature depolarization: Secondary | ICD-10-CM | POA: Diagnosis not present

## 2017-10-31 NOTE — Progress Notes (Signed)
  Pt states his wife passed away last year.  Pt is upset he has not lost any weight and states maybe [facility] should [facility]. Pt states for 2-3 weeks he measured his food and paid attention to what he was eating but has not done that since. Pt states his blood sugars are doing better. Pt states he has been working on walking 1.5 hours 5 days a week. Pt states he has a trainer now too. Pt states his heart rate is lower in a healthier range while exercising. Pt states he has stopped buying desserts. Pt states when he is full he coughs but continues to eat to the point of causing reflux. Pt admits to emotional eating and using eating as a time filler. Pt states he is having a hard time accepting it will take time to instill new habits and struggles with cooking at home due to that.   Pt states he checks his blood sugars every morning before eating: 120's, 153  Pt Goals: -Pt states he wants to eat more from home -Working on accepting he has time  -Find a hobby  24 hr recall:  2% milk cereal with blueberries  Fast food  Snack: banana  Dinner: BBQ green beans and cucumbers and 2 slices of bread and peach cobbler   Snack: orange   Beverages: some coke to take medications, water

## 2017-10-31 NOTE — Patient Instructions (Addendum)
Medication Instructions:  Your physician recommends that you continue on your current medications as directed. Please refer to the Current Medication list given to you today. If you need a refill on your cardiac medications before your next appointment, please call your pharmacy.  Labwork: Please schedule lab work between 7/25 - 8/6 for: BMET & CBC *We will notify of abnormal results.  Otherwise, continue current treatment plan.  Testing/Procedures: Your physician has requested that you have cardiac CT. Cardiac computed tomography (CT) is a painless test that uses an x-ray machine to take clear, detailed pictures of your heart. For further information please visit [email or web address]. Please follow instructions below located under special instructions.-You will get a call from our office to schedule the date for this test.  Your physician has recommended that you have an ablation. Catheter ablation is a medical procedure used to treat some cardiac arrhythmias (irregular heartbeats). During catheter ablation, a long, thin, flexible tube is put into a blood vessel in your groin (upper thigh), or neck. This tube is called an ablation catheter. It is then guided to your heart through the blood vessel. Radio frequency waves destroy small areas of heart tissue where abnormal heartbeats may cause an arrhythmia to start. Please review instruction below located under special instructions.  Follow-Up: Your physician recommends that you schedule a follow-up appointment in: 4 weeks with [name] - before 8/14.  Your physician recommends that you schedule a follow-up appointment in: 4 weeks, after your procedure on 12/13/2017, with Roderic Palau in the AFib clinic.  Your physician recommends that you schedule a follow-up appointment in: 3 months, after your procedure on 12/13/2017, with Dr. Ileana Ladd.    Special Instructions:  CARDIAC CT INSTRUCTIONS:  Please arrive at the [facility] main  entrance of [facility], [place] [address] [phone]  Proceed to the [facility] Radiology Department (First Floor).  Please follow these instructions carefully (unless otherwise directed):  Hold all erectile dysfunction medications at least 48 hours prior to test.  On the Night Before the Test:  Drink plenty of water.  Do not consume any caffeinated/decaffeinated beverages or chocolate 12 hours prior to your test.  Do not take any antihistamines 12 hours prior to your test.  If you take Metformin do not take 24 hours prior to test.  On the Day of the Test:  Drink plenty of water. Do not drink any water within one hour of the test.  Do not eat any food 4 hours prior to the test.  You may take your regular medications prior to the test.  MAKE SURE TO TAKE YOUR TOPROL THE MORNING OF THE TESTING.  HOLD Furosemide morning of the test.  After the Test:  Drink plenty of water.  After receiving IV contrast, you may experience a mild flushed feeling. This is normal.  On occasion, you may experience a mild rash up to 24 hours after the test. This is not dangerous. If this occurs, you can take Benadryl 25 mg and increase your fluid intake.  If you experience trouble breathing, this can be serious. If it is severe call 911 IMMEDIATELY. If it is mild, please call our office.  If you take any of these medications: Glipizide/Metformin, Avandament, Glucavance, please do not take 48 hours after completing test.    ABLATION INSTRUCTIONS:  Please arrive at the [facility] main entrance of [facility] hospital at: 5:30 a.m. on 12/13/2017 Do not eat or drink after  midnight prior to procedure DO NOT MISS any doses of your XARELTO. Take 1/2 your usual dose of bedtime Lantus the night before your procedure.  You will take 14 units. On the morning of your procedure do not take any medications. Plan for one night stay.   You will need someone to drive you home at discharge.

## 2017-10-31 NOTE — Progress Notes (Signed)
Electrophysiology Office Note   Date:  10/31/2017   ID:  Mark Singh, DOB 04/22/1946, MRN [id number]  PCP:  Kathyrn Lass, MD  Cardiologist:  Marlou Porch Primary Electrophysiologist:  Dezaree Tracey Meredith Leeds, MD    Chief Complaint  Patient presents with  . Follow-up    Persistent Afib/PVC's     History of Present Illness: Mark Singh is a 72 y.o. male who presents today for electrophysiology evaluation.   history of PVC's, bradycardia, nephrotic syndrome (complete responder to steroid therapy by Dr. Lorrene Reid), DM, HTN, HLD, obesity, orthostatic hypotension, and mild pulm HTN felt 2/2 obesity Comes in today now s/p ablation procedure for his PVC's.  PVCs were mapped to inferior to the [facility] and successfully ablated on 03/30/15.  Today, denies symptoms of palpitations, chest pain, shortness of breath, orthopnea, PND, lower extremity edema, claudication, dizziness, presyncope, syncope, bleeding, or neurologic sequela. The patient is tolerating medications without difficulties.  Overall he is doing well.  Unfortunately, he remains in atrial fibrillation.  His main complaints are weakness, fatigue, and shortness of breath.  He is able to exercise, but he is having more and more difficulties in completing the exercise that he wishes to do.   Past Medical History:  Diagnosis Date  . Arthritis   . Asthma   . Back pain   . Bradycardia    a. Repoted h/o HR in the 40s.  . Edema    a. 2D echo 11/2013: EF 18-29%, LV diastolic function parameters were normal, severely dilated LA, PASP 43mmHg..  . H/O: gout    right foot  . HTN (hypertension)   . Hyperlipidemia   . Myalgia and myositis, unspecified   . Nephrotic syndrome    a. responder to steroid therapy by Dr. Lorrene Reid.   . Obesity   . Orthostatic hypotension   . Penile neoplasm   . Pulmonary hypertension ([place])    a. Mild pulm HTN felt 2/2 obesity.  [name] PVC's (premature ventricular contractions)    a. Holter 05/2013 showing 18k  PVCs (18% of the time).  . Seasonal asthma    mild  . Shoulder pain   . Type 2 diabetes mellitus ([place])    Past Surgical History:  Procedure Laterality Date  . CARDIOVERSION N/A 07/22/2015   Procedure: CARDIOVERSION;  Surgeon: Dorothy Spark, MD;  Location: [facility];  Service: Cardiovascular;  Laterality: N/A;  . CARDIOVERSION N/A 10/06/2015   Procedure: CARDIOVERSION;  Surgeon: Sueanne Margarita, MD;  Location: [facility] ENDOSCOPY;  Service: Cardiovascular;  Laterality: N/A;  . CARDIOVERSION N/A 12/05/2016   Procedure: CARDIOVERSION;  Surgeon: Pixie Casino, MD;  Location: [facility] ENDOSCOPY;  Service: Cardiovascular;  Laterality: N/A;  . CIRCUMCISION N/A 11/27/2012   Procedure:  CIRCUMCISION  AND EXCISION OF GLANS PENIS;  Surgeon: Fredricka Bonine, MD;  Location: [facility];  Service: Urology;  Laterality: N/A;  . ELECTROPHYSIOLOGIC STUDY N/A 03/30/2015   Procedure: PVC Ablation;  Surgeon: Shayne Diguglielmo Meredith Leeds, MD;  Location: [place] CV LAB;  Service: Cardiovascular;  Laterality: N/A;  . HAMMER TOE SURGERY    . LEFT FOOT SURGERY  1992  &  2008   REMOVAL HEAL SPUR/ BUNIONECTOMY AND HAMMERTOE REPAIR X2  . UMBILICAL HERNIA REPAIR  11-04-2005     Current Outpatient Medications  Medication Sig Dispense Refill  . Cholecalciferol (VITAMIN D3) 5000 units CAPS Take 5,000 Units by mouth daily.     . Fluticasone-Salmeterol (WIXELA INHUB) 100-50 MCG/DOSE AEPB Inhale 1 puff into the lungs  2 (two) times daily.    . insulin glargine (LANTUS) 100 UNIT/ML injection Inject 28 Units into the skin at bedtime.    . metFORMIN (GLUCOPHAGE) 1000 MG tablet Take 1,000 mg by mouth 2 (two) times daily.  0  . metoprolol succinate (TOPROL-XL) 100 MG 24 hr tablet Take 1 tablet (100 mg total) by mouth daily. Take with or immediately following a meal. 90 tablet 3  . omeprazole (PRILOSEC) 20 MG capsule Take 20 mg by mouth daily.  0  . pimecrolimus (ELIDEL) 1 % cream Apply 1 application topically 2  (two) times daily as needed (psoriasis).     . probenecid (BENEMID) 500 MG tablet Take 500 mg by mouth 2 (two) times daily.     . simvastatin (ZOCOR) 40 MG tablet Take 40 mg by mouth daily with supper.     . tamsulosin (FLOMAX) 0.4 MG CAPS capsule Take 0.4 mg by mouth daily.  4  . vitamin B-12 (CYANOCOBALAMIN) 1000 MCG tablet Take 1,000 mcg by mouth daily.    Alveda Reasons 20 MG TABS tablet TAKE 1 TABLET (20 MG TOTAL) BY MOUTH DAILY WITH SUPPER. 30 tablet 5   No current facility-administered medications for this visit.     Allergies:   Indomethacin   Social History:  The patient  reports that he has never smoked. He has never used smokeless tobacco. He reports that he does not drink alcohol or use drugs.   Family History:  The patient's family history includes CVA in his father; Coronary artery disease in his mother.   ROS:  Please see the history of present illness.   Otherwise, review of systems is positive for snoring, easy bruising.   All other systems are reviewed and negative.   PHYSICAL EXAM: VS:  BP 132/86   Pulse (!) 52   Ht 5\' 9"  (1.753 m)   Wt 247 lb 6.4 oz (112.2 kg)   BMI 36.53 kg/m  , BMI Body mass index is 36.53 kg/m. GEN: Well nourished, well developed, in no acute distress  HEENT: normal  Neck: no JVD, carotid bruits, or masses Cardiac: iRRR; no murmurs, rubs, or gallops,no edema  Respiratory:  clear to auscultation bilaterally, normal work of breathing GI: soft, nontender, nondistended, + BS MS: no deformity or atrophy  Skin: warm and dry Neuro:  Strength and sensation are intact Psych: euthymic mood, full affect  EKG:  EKG is ordered today. Personal review of the ekg ordered shows atrial fibrillation, rate 52  Recent Labs: 12/01/2016: BUN 12; Creatinine, Ser 1.22; Hemoglobin 14.3; Platelets 332; Potassium 4.4; Sodium 140    Lipid Panel  No results found for: CHOL, TRIG, HDL, CHOLHDL, VLDL, LDLCALC, LDLDIRECT   Wt Readings from Last 3 Encounters:  10/31/17  247 lb 6.4 oz (112.2 kg)  10/31/17 273 lb 8 oz (124.1 kg)  08/01/17 266 lb (120.7 kg)      Other studies Reviewed: Additional studies/ records that were reviewed today include: TTE 12/28/14  Review of the above records today demonstrates:  - Left ventricle: The cavity size was normal. There was mild concentric hypertrophy. Systolic function was normal. The estimated ejection fraction was in the range of 55% to 60%. Although no diagnostic regional wall motion abnormality was identified, this possibility cannot be completely excluded on the basis of this study. Left ventricular diastolic function parameters were normal. - Left atrium: The atrium was severely dilated. - Pulmonary arteries: Systolic pressure was mildly increased. PA peak pressure: 38 mm Hg (S).  ASSESSMENT AND PLAN:  1.  PVCs: Post ablation November 2016 with good results.  Continue current management.  2.  Persistent atrial fibrillation: Currently on Xarelto.  He was taken off of flecainide at his last visit as this was not helping to control his symptoms.  He is continued to have atrial fibrillation.  At this point, he would prefer ablation.  Risks and benefits were discussed and include bleeding, tamponade, heart block, stroke, damage to surrounding organs.  He understands risks and is agreed to the procedure.  This patients CHA2DS2-VASc Score and unadjusted Ischemic Stroke Rate (% per year) is equal to 3.2 % stroke rate/year from a score of 3  Above score calculated as 1 point each if present [CHF, HTN, DM, Vascular=MI/PAD/Aortic Plaque, Age if 65-74, or Male] Above score calculated as 2 points each if present [Age > 75, or Stroke/TIA/TE]  Current medicines are reviewed at length with the patient today.   The patient does not have concerns regarding his medicines.  The following changes were made today: None  Labs/ tests ordered today include:  Orders Placed This Encounter  Procedures  . CT  CARDIAC MORPH/PULM VEIN W/CM&W/O CA SCORE  . CT CORONARY FRACTIONAL FLOW RESERVE DATA PREP  . CT CORONARY FRACTIONAL FLOW RESERVE FLUID ANALYSIS  . Basic Metabolic Panel (BMET)  . CBC  . EKG 12-Lead     Disposition:   FU with Colleen Donahoe 3 months  Signed, Japleen Tornow Meredith Leeds, MD  10/31/2017 11:39 AM     [facility] [address] [place] [address] [phone] (office) [phone] (fax)

## 2017-11-26 NOTE — Progress Notes (Signed)
Electrophysiology Office Note   Date:  11/27/2017   ID:  Mark Singh, DOB 1945-11-16, MRN [id number]  PCP:  Kathyrn Lass, MD  Cardiologist:  Marlou Porch Primary Electrophysiologist:  Mikie Misner Meredith Leeds, MD    Chief Complaint  Patient presents with  . Follow-up    Persistent Afib/H&P pre procedure labs     History of Present Illness: Mark Singh is a 72 y.o. male who presents today for electrophysiology evaluation.   history of PVC's, bradycardia, nephrotic syndrome (complete responder to steroid therapy by Dr. Lorrene Reid), DM, HTN, HLD, obesity, orthostatic hypotension, and mild pulm HTN felt 2/2 obesity Comes in today now s/p ablation procedure for his PVC's.  PVCs were mapped to inferior to the [facility] and successfully ablated on 03/30/15.  Today, denies symptoms of palpitations, chest pain, shortness of breath, orthopnea, PND, lower extremity edema, claudication, dizziness, presyncope, syncope, bleeding, or neurologic sequela. The patient is tolerating medications without difficulties.  Overall he is doing well.  His main complaint is of weakness and fatigue that he feels is related to his atrial fibrillation.   Past Medical History:  Diagnosis Date  . Arthritis   . Asthma   . Back pain   . Bradycardia    a. Repoted h/o HR in the 40s.  . Edema    a. 2D echo 11/2013: EF 17-40%, LV diastolic function parameters were normal, severely dilated LA, PASP 85mmHg..  . H/O: gout    right foot  . HTN (hypertension)   . Hyperlipidemia   . Myalgia and myositis, unspecified   . Nephrotic syndrome    a. responder to steroid therapy by Dr. Lorrene Reid.   . Obesity   . Orthostatic hypotension   . Penile neoplasm   . Pulmonary hypertension ([place])    a. Mild pulm HTN felt 2/2 obesity.  [name] PVC's (premature ventricular contractions)    a. Holter 05/2013 showing 18k PVCs (18% of the time).  . Seasonal asthma    mild  . Shoulder pain   . Type 2 diabetes mellitus ([place])    Past  Surgical History:  Procedure Laterality Date  . CARDIOVERSION N/A 07/22/2015   Procedure: CARDIOVERSION;  Surgeon: Dorothy Spark, MD;  Location: [place];  Service: Cardiovascular;  Laterality: N/A;  . CARDIOVERSION N/A 10/06/2015   Procedure: CARDIOVERSION;  Surgeon: Sueanne Margarita, MD;  Location: [place] ENDOSCOPY;  Service: Cardiovascular;  Laterality: N/A;  . CARDIOVERSION N/A 12/05/2016   Procedure: CARDIOVERSION;  Surgeon: Pixie Casino, MD;  Location: [facility] ENDOSCOPY;  Service: Cardiovascular;  Laterality: N/A;  . CIRCUMCISION N/A 11/27/2012   Procedure:  CIRCUMCISION  AND EXCISION OF GLANS PENIS;  Surgeon: Fredricka Bonine, MD;  Location: [facility];  Service: Urology;  Laterality: N/A;  . ELECTROPHYSIOLOGIC STUDY N/A 03/30/2015   Procedure: PVC Ablation;  Surgeon: Nyair Depaulo Meredith Leeds, MD;  Location: [place] CV LAB;  Service: Cardiovascular;  Laterality: N/A;  . HAMMER TOE SURGERY    . LEFT FOOT SURGERY  1992  &  2008   REMOVAL HEAL SPUR/ BUNIONECTOMY AND HAMMERTOE REPAIR X2  . UMBILICAL HERNIA REPAIR  11-04-2005     Current Outpatient Medications  Medication Sig Dispense Refill  . Cholecalciferol (VITAMIN D3) 5000 units CAPS Take 5,000 Units by mouth daily.     . Fluticasone-Salmeterol (WIXELA INHUB) 100-50 MCG/DOSE AEPB Inhale 1 puff into the lungs 2 (two) times daily.    . insulin glargine (LANTUS) 100 UNIT/ML injection Inject 28 Units into the skin  at bedtime.    . metFORMIN (GLUCOPHAGE) 1000 MG tablet Take 1,000 mg by mouth 2 (two) times daily.  0  . metoprolol succinate (TOPROL-XL) 100 MG 24 hr tablet Take 1 tablet (100 mg total) by mouth daily. Take with or immediately following a meal. 90 tablet 3  . omeprazole (PRILOSEC) 20 MG capsule Take 20 mg by mouth daily.  0  . pimecrolimus (ELIDEL) 1 % cream Apply 1 application topically 2 (two) times daily as needed (psoriasis).     . probenecid (BENEMID) 500 MG tablet Take 500 mg by mouth 2 (two) times  daily.     . simvastatin (ZOCOR) 40 MG tablet Take 40 mg by mouth daily with supper.     . tamsulosin (FLOMAX) 0.4 MG CAPS capsule Take 0.4 mg by mouth daily.  4  . vitamin B-12 (CYANOCOBALAMIN) 1000 MCG tablet Take 1,000 mcg by mouth daily.    Alveda Reasons 20 MG TABS tablet TAKE 1 TABLET (20 MG TOTAL) BY MOUTH DAILY WITH SUPPER. 30 tablet 5   No current facility-administered medications for this visit.     Allergies:   Indomethacin   Social History:  The patient  reports that he has never smoked. He has never used smokeless tobacco. He reports that he does not drink alcohol or use drugs.   Family History:  The patient's family history includes CVA in his father; Coronary artery disease in his mother.   ROS:  Please see the history of present illness.   Otherwise, review of systems is positive for none.   All other systems are reviewed and negative.   PHYSICAL EXAM: VS:  BP (!) 148/86   Pulse 66   Ht 5\' 9"  (1.753 m)   Wt 274 lb (124.3 kg)   SpO2 95%   BMI 40.46 kg/m  , BMI Body mass index is 40.46 kg/m. GEN: Well nourished, well developed, in no acute distress  HEENT: normal  Neck: no JVD, carotid bruits, or masses Cardiac: iRRR; no murmurs, rubs, or gallops,no edema  Respiratory:  clear to auscultation bilaterally, normal work of breathing GI: soft, nontender, nondistended, + BS MS: no deformity or atrophy  Skin: warm and dry Neuro:  Strength and sensation are intact Psych: euthymic mood, full affect  EKG:  EKG is not ordered today. Personal review of the ekg ordered 10/31/17 shows AF, rate 52   Recent Labs: 12/01/2016: BUN 12; Creatinine, Ser 1.22; Hemoglobin 14.3; Platelets 332; Potassium 4.4; Sodium 140    Lipid Panel  No results found for: CHOL, TRIG, HDL, CHOLHDL, VLDL, LDLCALC, LDLDIRECT   Wt Readings from Last 3 Encounters:  11/27/17 274 lb (124.3 kg)  10/31/17 247 lb 6.4 oz (112.2 kg)  10/31/17 273 lb 8 oz (124.1 kg)      Other studies Reviewed: Additional  studies/ records that were reviewed today include: TTE 12/28/14  Review of the above records today demonstrates:  - Left ventricle: The cavity size was normal. There was mild concentric hypertrophy. Systolic function was normal. The estimated ejection fraction was in the range of 55% to 60%. Although no diagnostic regional wall motion abnormality was identified, this possibility cannot be completely excluded on the basis of this study. Left ventricular diastolic function parameters were normal. - Left atrium: The atrium was severely dilated. - Pulmonary arteries: Systolic pressure was mildly increased. PA peak pressure: 38 mm Hg (S).   ASSESSMENT AND PLAN:  1.  PVCs: This ablation November 2016 with good results.  Continue with current  management.  2.  Persistent atrial fibrillation: Currently on Xarelto.  Flecainide is not controlling his symptoms as he was taken off.  Plan for ablation 12/13/2017.  Risks and benefits were discussed which include bleeding, tamponade, heart block, stroke, damage to surrounding organs.  The patient understands these risks and is agreed to the procedure.    This patients CHA2DS2-VASc Score and unadjusted Ischemic Stroke Rate (% per year) is equal to 3.2 % stroke rate/year from a score of 3  Above score calculated as 1 point each if present [CHF, HTN, DM, Vascular=MI/PAD/Aortic Plaque, Age if 65-74, or Male] Above score calculated as 2 points each if present [Age > 75, or Stroke/TIA/TE]  Current medicines are reviewed at length with the patient today.   The patient does not have concerns regarding his medicines.  The following changes were made today: None  Labs/ tests ordered today include:  No orders of the defined types were placed in this encounter.    Disposition:   FU with Mark Singh 3 months  Signed, Anuhea Gassner Meredith Leeds, MD  11/27/2017 9:12 AM     [facility] [address] [place]  [address] [phone] (office) [phone] (fax)

## 2017-11-27 ENCOUNTER — Ambulatory Visit: Payer: Medicare Other | Admitting: Cardiology

## 2017-11-27 ENCOUNTER — Encounter: Payer: Self-pay | Admitting: Cardiology

## 2017-11-27 ENCOUNTER — Other Ambulatory Visit: Payer: Medicare Other | Admitting: *Deleted

## 2017-11-27 VITALS — BP 148/86 | HR 66 | Ht 69.0 in | Wt 274.0 lb

## 2017-11-27 DIAGNOSIS — Z01812 Encounter for preprocedural laboratory examination: Secondary | ICD-10-CM

## 2017-11-27 DIAGNOSIS — I481 Persistent atrial fibrillation: Secondary | ICD-10-CM | POA: Diagnosis not present

## 2017-11-27 DIAGNOSIS — I4819 Other persistent atrial fibrillation: Secondary | ICD-10-CM

## 2017-11-27 DIAGNOSIS — I493 Ventricular premature depolarization: Secondary | ICD-10-CM

## 2017-11-27 LAB — BASIC METABOLIC PANEL
BUN / CREAT RATIO: 10 (ref 10–24)
BUN: 13 mg/dL (ref 8–27)
CO2: 27 mmol/L (ref 20–29)
Calcium: 9.1 mg/dL (ref 8.6–10.2)
Chloride: 100 mmol/L (ref 96–106)
Creatinine, Ser: 1.27 mg/dL (ref 0.76–1.27)
GFR calc Af Amer: 65 mL/min/{1.73_m2} (ref >59)
GFR calc non Af Amer: 56 mL/min/{1.73_m2} — ABNORMAL LOW (ref >59)
Glucose: 133 mg/dL — ABNORMAL HIGH (ref 65–99)
POTASSIUM: 5.2 mmol/L (ref 3.5–5.2)
Sodium: 139 mmol/L (ref 134–144)

## 2017-11-27 LAB — CBC
Hematocrit: 41.6 % (ref 37.5–51.0)
Hemoglobin: 14.1 g/dL (ref 13.0–17.7)
MCH: 29.7 pg (ref 26.6–33.0)
MCHC: 33.9 g/dL (ref 31.5–35.7)
MCV: 88 fL (ref 79–97)
Platelets: 306 10*3/uL (ref 150–450)
RBC: 4.75 x10E6/uL (ref 4.14–5.80)
RDW: 14 % (ref 12.3–15.4)
WBC: 6.5 10*3/uL (ref 3.4–10.8)

## 2017-11-27 NOTE — Patient Instructions (Addendum)
Medication Instructions:  Your physician recommends that you continue on your current medications as directed. Please refer to the Current Medication list given to you today.  *If you need a refill on your cardiac medications before your next appointment, please call your pharmacy.*  Labwork: Pre procedure lab work today: BMET & CBC *We will notify of abnormal results. Otherwise, continue current treatment plan.  Testing/Procedures: Your physician has requested that you have cardiac CT. Cardiac computed tomography (CT) is a painless test that uses an x-ray machine to take clear, detailed pictures of your heart. For further information please visit [facility]uge[facility]. Please follow instructions below located under special instructions.  Your physician has recommended that you have an ablation. Catheter ablation is a medical procedure used to treat some cardiac arrhythmias (irregular heartbeats). During catheter ablation, a long, thin, flexible tube is put into a blood vessel in your groin (upper thigh), or neck. This tube is called an ablation catheter. It is then guided to your heart through the blood vessel. Radio frequency waves destroy small areas of heart tissue where abnormal heartbeats may cause an arrhythmia to start.Please review instruction below located under special instructions.  Follow-Up: Keep your scheduled post ablation follow up with Roderic Palau, NP in the AFib clinic on 01/10/2018 @ 11:00 a.m.  Keep your scheduled post ablation follow up with [name] on 03/19/2018 @ 11:30 a.m.    Special Instructions:  CARDIAC CT INSTRUCTIONS:  Please arrive at the [facility] main entrance of [facility] [place], [place] [address] [phone]  Proceed to the [facility] Radiology Department (First Floor).  Please follow these instructions carefully (unless otherwise directed):  Hold all erectile dysfunction  medications at least 48 hours prior to test.  On the Night Before the Test:  Drink plenty of water.  Do not consume any caffeinated/decaffeinated beverages or chocolate 12 hours prior to your test.  Do not take any antihistamines 12 hours prior to your test.  If you take Metformin do not take 24 hours prior to test.  On the Day of the Test:  Drink plenty of water. Do not drink any water within one hour of the test.  Do not eat any food 4 hours prior to the test.  You may take your regular medications prior to the test.  MAKE SURE TO TAKE YOUR TOPROL THE MORNING OF THE TESTING.  HOLD Furosemide morning of the test.  After the Test:  Drink plenty of water.  After receiving IV contrast, you may experience a mild flushed feeling. This is normal.  On occasion, you may experience a mild rash up to 24 hours after the test. This is not dangerous. If this occurs, you can take Benadryl 25 mg and increase your fluid intake.  If you experience trouble breathing, this can be serious. If it is severe call 911 IMMEDIATELY. If it is mild, please call our office.  If you take any of these medications: Glipizide/Metformin, Avandament, Glucavance, please do not take 48 hours after completing test.     ABLATION INSTRUCTIONS:  Please arrive at the [facility] main entrance of [facility] hospital at:5:30 a.m. on 12/13/2017 Do not eat or drink after midnight prior to procedure DO NOT MISS any doses of your XARELTO. Take 1/2 your usual dose of bedtime Lantus the night before your procedure.  You will take 14 units. On the morning of your procedure do not take any medications. Plan for one night stay.  You will need someone to drive you home at discharge

## 2017-12-08 ENCOUNTER — Ambulatory Visit (HOSPITAL_COMMUNITY)
Admission: RE | Admit: 2017-12-08 | Discharge: 2017-12-08 | Disposition: A | Discharge status: Home / Self Care | Payer: Medicare Other | Source: Ambulatory Visit | Attending: Cardiology | Admitting: Cardiology

## 2017-12-08 ENCOUNTER — Encounter (HOSPITAL_COMMUNITY): Payer: Self-pay

## 2017-12-08 ENCOUNTER — Ambulatory Visit (HOSPITAL_COMMUNITY): Payer: Medicare Other

## 2017-12-08 DIAGNOSIS — I481 Persistent atrial fibrillation: Secondary | ICD-10-CM | POA: Insufficient documentation

## 2017-12-08 DIAGNOSIS — I4819 Other persistent atrial fibrillation: Secondary | ICD-10-CM

## 2017-12-08 MED ORDER — IOPAMIDOL (ISOVUE-370) INJECTION 76%
100.0000 mL | Freq: Once | INTRAVENOUS | Status: AC | PRN
  Administered 2017-12-08: 100 mL via INTRAVENOUS

## 2017-12-13 ENCOUNTER — Encounter (HOSPITAL_COMMUNITY): Payer: Self-pay | Admitting: Cardiology

## 2017-12-13 ENCOUNTER — Ambulatory Visit (HOSPITAL_COMMUNITY)
Admission: RE | Admit: 2017-12-13 | Discharge: 2017-12-14 | Disposition: A | Discharge status: Home / Self Care | Payer: Medicare Other | Source: Ambulatory Visit | Attending: Cardiology | Admitting: Cardiology

## 2017-12-13 ENCOUNTER — Other Ambulatory Visit: Payer: Self-pay

## 2017-12-13 ENCOUNTER — Ambulatory Visit (HOSPITAL_COMMUNITY): Payer: Medicare Other | Admitting: Certified Registered"

## 2017-12-13 ENCOUNTER — Encounter (HOSPITAL_COMMUNITY)
Admission: RE | Disposition: A | Discharge status: Home / Self Care | Payer: Self-pay | Source: Ambulatory Visit | Attending: Cardiology

## 2017-12-13 DIAGNOSIS — Z794 Long term (current) use of insulin: Secondary | ICD-10-CM | POA: Diagnosis not present

## 2017-12-13 DIAGNOSIS — N059 Unspecified nephritic syndrome with unspecified morphologic changes: Secondary | ICD-10-CM | POA: Insufficient documentation

## 2017-12-13 DIAGNOSIS — Z888 Allergy status to other drugs, medicaments and biological substances status: Secondary | ICD-10-CM | POA: Insufficient documentation

## 2017-12-13 DIAGNOSIS — Z79899 Other long term (current) drug therapy: Secondary | ICD-10-CM | POA: Diagnosis not present

## 2017-12-13 DIAGNOSIS — E119 Type 2 diabetes mellitus without complications: Secondary | ICD-10-CM | POA: Insufficient documentation

## 2017-12-13 DIAGNOSIS — M1990 Unspecified osteoarthritis, unspecified site: Secondary | ICD-10-CM | POA: Diagnosis not present

## 2017-12-13 DIAGNOSIS — Z6841 Body Mass Index (BMI) 40.0 and over, adult: Secondary | ICD-10-CM | POA: Diagnosis not present

## 2017-12-13 DIAGNOSIS — Z7902 Long term (current) use of antithrombotics/antiplatelets: Secondary | ICD-10-CM | POA: Diagnosis not present

## 2017-12-13 DIAGNOSIS — Z9889 Other specified postprocedural states: Secondary | ICD-10-CM | POA: Diagnosis not present

## 2017-12-13 DIAGNOSIS — I493 Ventricular premature depolarization: Secondary | ICD-10-CM | POA: Insufficient documentation

## 2017-12-13 DIAGNOSIS — I10 Essential (primary) hypertension: Secondary | ICD-10-CM | POA: Diagnosis not present

## 2017-12-13 DIAGNOSIS — E669 Obesity, unspecified: Secondary | ICD-10-CM | POA: Diagnosis not present

## 2017-12-13 DIAGNOSIS — I481 Persistent atrial fibrillation: Secondary | ICD-10-CM

## 2017-12-13 DIAGNOSIS — E785 Hyperlipidemia, unspecified: Secondary | ICD-10-CM | POA: Insufficient documentation

## 2017-12-13 DIAGNOSIS — J45909 Unspecified asthma, uncomplicated: Secondary | ICD-10-CM | POA: Insufficient documentation

## 2017-12-13 DIAGNOSIS — I4819 Other persistent atrial fibrillation: Secondary | ICD-10-CM | POA: Diagnosis present

## 2017-12-13 DIAGNOSIS — Z8249 Family history of ischemic heart disease and other diseases of the circulatory system: Secondary | ICD-10-CM | POA: Insufficient documentation

## 2017-12-13 DIAGNOSIS — I272 Pulmonary hypertension, unspecified: Secondary | ICD-10-CM | POA: Insufficient documentation

## 2017-12-13 HISTORY — DX: Malignant neoplasm of penis, unspecified: C60.9

## 2017-12-13 HISTORY — DX: Reserved for concepts with insufficient information to code with codable children: IMO0002

## 2017-12-13 HISTORY — PX: ATRIAL FIBRILLATION ABLATION: EP1191

## 2017-12-13 LAB — GLUCOSE, CAPILLARY
GLUCOSE-CAPILLARY: 134 mg/dL — AB (ref 70–99)
GLUCOSE-CAPILLARY: 172 mg/dL — AB (ref 70–99)
GLUCOSE-CAPILLARY: 172 mg/dL — AB (ref 70–99)
GLUCOSE-CAPILLARY: 160 mg/dL — AB (ref 70–99)
Glucose-Capillary: 141 mg/dL — ABNORMAL HIGH (ref 70–99)

## 2017-12-13 LAB — POCT ACTIVATED CLOTTING TIME
ACTIVATED CLOTTING TIME: 329 s
ACTIVATED CLOTTING TIME: 197 s
Activated Clotting Time: 318 seconds
Activated Clotting Time: 329 seconds
Activated Clotting Time: 202 seconds

## 2017-12-13 SURGERY — ATRIAL FIBRILLATION ABLATION
Anesthesia: General

## 2017-12-13 MED ORDER — PANTOPRAZOLE SODIUM 40 MG PO TBEC
40.0000 mg | DELAYED_RELEASE_TABLET | Freq: Every day | ORAL | Status: DC
  Administered 2017-12-13 – 2017-12-14 (×2): 40 mg via ORAL
  Filled 2017-12-13 (×2): qty 1

## 2017-12-13 MED ORDER — DEXAMETHASONE SODIUM PHOSPHATE 10 MG/ML IJ SOLN
INTRAMUSCULAR | Status: DC | PRN
  Administered 2017-12-13: 10 mg via INTRAVENOUS

## 2017-12-13 MED ORDER — HEPARIN (PORCINE) IN NACL 1000-0.9 UT/500ML-% IV SOLN
INTRAVENOUS | Status: DC | PRN
  Administered 2017-12-13 (×5): 500 mL

## 2017-12-13 MED ORDER — SUGAMMADEX SODIUM 500 MG/5ML IV SOLN
INTRAVENOUS | Status: DC | PRN
  Administered 2017-12-13: 250 mg via INTRAVENOUS

## 2017-12-13 MED ORDER — INSULIN ASPART 100 UNIT/ML ~~LOC~~ SOLN
0.0000 [IU] | Freq: Three times a day (TID) | SUBCUTANEOUS | Status: DC
  Administered 2017-12-13 (×2): 3 [IU] via SUBCUTANEOUS
  Administered 2017-12-14: 2 [IU] via SUBCUTANEOUS

## 2017-12-13 MED ORDER — SODIUM CHLORIDE 0.9 % IV SOLN: 250.0000 mL | INTRAVENOUS | Status: DC | PRN

## 2017-12-13 MED ORDER — SODIUM CHLORIDE 0.9 % IV SOLN
INTRAVENOUS | Status: DC | PRN
  Administered 2017-12-13: 30 ug/min via INTRAVENOUS

## 2017-12-13 MED ORDER — PROMETHAZINE HCL 25 MG/ML IJ SOLN: 6.2500 mg | INTRAMUSCULAR | Status: DC | PRN

## 2017-12-13 MED ORDER — METFORMIN HCL 500 MG PO TABS
1000.0000 mg | ORAL_TABLET | Freq: Two times a day (BID) | ORAL | Status: DC
  Administered 2017-12-13 – 2017-12-14 (×2): 1000 mg via ORAL
  Filled 2017-12-13 (×2): qty 2

## 2017-12-13 MED ORDER — HEPARIN SODIUM (PORCINE) 1000 UNIT/ML IJ SOLN
INTRAMUSCULAR | Status: DC | PRN
  Administered 2017-12-13: 1000 [IU] via INTRAVENOUS

## 2017-12-13 MED ORDER — ONDANSETRON HCL 4 MG/2ML IJ SOLN
INTRAMUSCULAR | Status: DC | PRN
  Administered 2017-12-13: 4 mg via INTRAVENOUS

## 2017-12-13 MED ORDER — FENTANYL CITRATE (PF) 100 MCG/2ML IJ SOLN: 25.0000 ug | INTRAMUSCULAR | Status: DC | PRN

## 2017-12-13 MED ORDER — SIMVASTATIN 40 MG PO TABS
40.0000 mg | ORAL_TABLET | Freq: Every day | ORAL | Status: DC
  Administered 2017-12-13: 40 mg via ORAL
  Filled 2017-12-13: qty 1

## 2017-12-13 MED ORDER — ACETAMINOPHEN 325 MG PO TABS: 650.0000 mg | ORAL_TABLET | ORAL | Status: DC | PRN

## 2017-12-13 MED ORDER — ROCURONIUM BROMIDE 50 MG/5ML IV SOSY
PREFILLED_SYRINGE | INTRAVENOUS | Status: DC | PRN
  Administered 2017-12-13: 60 mg via INTRAVENOUS

## 2017-12-13 MED ORDER — BUPIVACAINE HCL (PF) 0.25 % IJ SOLN
INTRAMUSCULAR | Status: DC | PRN
  Administered 2017-12-13: 30 mL

## 2017-12-13 MED ORDER — PROPOFOL 10 MG/ML IV BOLUS
INTRAVENOUS | Status: DC | PRN
  Administered 2017-12-13: 130 mg via INTRAVENOUS

## 2017-12-13 MED ORDER — HEPARIN SODIUM (PORCINE) 1000 UNIT/ML IJ SOLN
INTRAMUSCULAR | Status: AC
  Filled 2017-12-13: qty 1

## 2017-12-13 MED ORDER — OXYCODONE HCL 5 MG PO TABS: 5.0000 mg | ORAL_TABLET | Freq: Once | ORAL | Status: DC | PRN

## 2017-12-13 MED ORDER — BUPIVACAINE HCL (PF) 0.25 % IJ SOLN
INTRAMUSCULAR | Status: AC
  Filled 2017-12-13: qty 30

## 2017-12-13 MED ORDER — RIVAROXABAN 20 MG PO TABS
20.0000 mg | ORAL_TABLET | Freq: Every day | ORAL | Status: DC
  Administered 2017-12-13: 20 mg via ORAL
  Filled 2017-12-13: qty 1

## 2017-12-13 MED ORDER — ONDANSETRON HCL 4 MG/2ML IJ SOLN: 4.0000 mg | Freq: Four times a day (QID) | INTRAMUSCULAR | Status: DC | PRN

## 2017-12-13 MED ORDER — DOBUTAMINE IN D5W 4-5 MG/ML-% IV SOLN
INTRAVENOUS | Status: AC
  Filled 2017-12-13: qty 250

## 2017-12-13 MED ORDER — VITAMIN D 1000 UNITS PO TABS: 5000.0000 [IU] | ORAL_TABLET | Freq: Every day | ORAL | Status: DC

## 2017-12-13 MED ORDER — OXYCODONE HCL 5 MG/5ML PO SOLN: 5.0000 mg | Freq: Once | ORAL | Status: DC | PRN

## 2017-12-13 MED ORDER — SODIUM CHLORIDE 0.9% FLUSH: 3.0000 mL | INTRAVENOUS | Status: DC | PRN

## 2017-12-13 MED ORDER — HEPARIN (PORCINE) IN NACL 1000-0.9 UT/500ML-% IV SOLN
INTRAVENOUS | Status: AC
  Filled 2017-12-13: qty 500

## 2017-12-13 MED ORDER — FLUTICASONE FUROATE-VILANTEROL 100-25 MCG/INH IN AEPB
1.0000 | INHALATION_SPRAY | Freq: Every day | RESPIRATORY_TRACT | Status: DC
  Administered 2017-12-14: 1 via RESPIRATORY_TRACT
  Filled 2017-12-13: qty 28

## 2017-12-13 MED ORDER — SODIUM CHLORIDE 0.9 % IV SOLN
INTRAVENOUS | Status: DC
  Administered 2017-12-13 (×2): via INTRAVENOUS

## 2017-12-13 MED ORDER — TAMSULOSIN HCL 0.4 MG PO CAPS
0.4000 mg | ORAL_CAPSULE | Freq: Every day | ORAL | Status: DC
  Administered 2017-12-13 – 2017-12-14 (×2): 0.4 mg via ORAL
  Filled 2017-12-13 (×2): qty 1

## 2017-12-13 MED ORDER — FENTANYL CITRATE (PF) 100 MCG/2ML IJ SOLN
INTRAMUSCULAR | Status: DC | PRN
  Administered 2017-12-13 (×2): 50 ug via INTRAVENOUS

## 2017-12-13 MED ORDER — PROTAMINE SULFATE 10 MG/ML IV SOLN
INTRAVENOUS | Status: DC | PRN
  Administered 2017-12-13: 40 mg via INTRAVENOUS

## 2017-12-13 MED ORDER — HEPARIN SODIUM (PORCINE) 1000 UNIT/ML IJ SOLN
INTRAMUSCULAR | Status: DC | PRN
  Administered 2017-12-13: 2000 [IU] via INTRAVENOUS
  Administered 2017-12-13: 16000 [IU] via INTRAVENOUS
  Administered 2017-12-13 (×2): 2000 [IU] via INTRAVENOUS

## 2017-12-13 MED ORDER — PROBENECID 500 MG PO TABS
500.0000 mg | ORAL_TABLET | Freq: Every day | ORAL | Status: DC
Start: 2017-12-13 — End: 2017-12-14
  Administered 2017-12-13 – 2017-12-14 (×2): 500 mg via ORAL
  Filled 2017-12-13 (×2): qty 1

## 2017-12-13 MED ORDER — EPHEDRINE SULFATE 50 MG/ML IJ SOLN
INTRAMUSCULAR | Status: DC | PRN
  Administered 2017-12-13: 15 mg via INTRAVENOUS
  Administered 2017-12-13: 10 mg via INTRAVENOUS
  Administered 2017-12-13: 15 mg via INTRAVENOUS
  Administered 2017-12-13: 10 mg via INTRAVENOUS

## 2017-12-13 MED ORDER — VITAMIN B-12 1000 MCG PO TABS
1000.0000 ug | ORAL_TABLET | Freq: Every day | ORAL | Status: DC
  Filled 2017-12-13: qty 1

## 2017-12-13 MED ORDER — METOPROLOL SUCCINATE ER 100 MG PO TB24: 100.0000 mg | ORAL_TABLET | Freq: Every day | ORAL | Status: DC

## 2017-12-13 MED ORDER — LIDOCAINE 2% (20 MG/ML) 5 ML SYRINGE
INTRAMUSCULAR | Status: DC | PRN
  Administered 2017-12-13: 80 mg via INTRAVENOUS

## 2017-12-13 MED ORDER — DOBUTAMINE IN D5W 4-5 MG/ML-% IV SOLN
INTRAVENOUS | Status: DC | PRN
  Administered 2017-12-13: 20 ug/kg/min via INTRAVENOUS

## 2017-12-13 MED ORDER — SODIUM CHLORIDE 0.9% FLUSH
3.0000 mL | Freq: Two times a day (BID) | INTRAVENOUS | Status: DC
  Administered 2017-12-13 – 2017-12-14 (×3): 3 mL via INTRAVENOUS

## 2017-12-13 MED ORDER — INSULIN GLARGINE 100 UNIT/ML ~~LOC~~ SOLN
28.0000 [IU] | Freq: Every day | SUBCUTANEOUS | Status: DC
  Administered 2017-12-13: 28 [IU] via SUBCUTANEOUS
  Filled 2017-12-13: qty 0.28

## 2017-12-13 SURGICAL SUPPLY — 20 items
BAG SNAP BAND KOVER 36X36 (MISCELLANEOUS) ×2 IMPLANT
BLANKET WARM UNDERBOD FULL ACC (MISCELLANEOUS) ×2 IMPLANT
CATH MAPPNG PENTARAY F 2-6-2MM (CATHETERS) ×1 IMPLANT
CATH SMTCH THERMOCOOL SF DF (CATHETERS) ×2 IMPLANT
CATH SOUNDSTAR 3D IMAGING (CATHETERS) ×2 IMPLANT
CATH WEBSTER BI DIR CS D-F CRV (CATHETERS) ×2 IMPLANT
COVER SWIFTLINK CONNECTOR (BAG) ×2 IMPLANT
PACK EP LATEX FREE (CUSTOM PROCEDURE TRAY) ×1
PACK EP LF (CUSTOM PROCEDURE TRAY) ×1 IMPLANT
PAD DEFIB LIFELINK (PAD) ×2 IMPLANT
PATCH CARTO3 (PAD) ×2 IMPLANT
PENTARAY F 2-6-2MM (CATHETERS) ×2
SHEATH AVANTI 11F 11CM (SHEATH) ×2 IMPLANT
SHEATH BAYLIS SUREFLEX  M 8.5 (SHEATH) ×2
SHEATH BAYLIS SUREFLEX M 8.5 (SHEATH) ×2 IMPLANT
SHEATH BAYLIS TRANSSEPTAL 98CM (NEEDLE) ×2 IMPLANT
SHEATH PINNACLE 7F 10CM (SHEATH) ×2 IMPLANT
SHEATH PINNACLE 8F 10CM (SHEATH) ×4 IMPLANT
SHEATH PINNACLE 9F 10CM (SHEATH) ×4 IMPLANT
TUBING SMART ABLATE COOLFLOW (TUBING) ×2 IMPLANT

## 2017-12-13 NOTE — Anesthesia Preprocedure Evaluation (Addendum)
Anesthesia Evaluation  Patient identified by MRN, date of birth, ID band Patient awake    Reviewed: Allergy & Precautions, NPO status , Patient's Chart, lab work & pertinent test results  History of Anesthesia Complications Negative for: history of anesthetic complications  Airway Mallampati: II  TM Distance: >3 FB Neck ROM: Full    Dental  (+) Dental Advisory Given, Teeth Intact   Pulmonary asthma ,    breath sounds clear to auscultation       Cardiovascular hypertension, Pt. on home beta blockers and Pt. on medications (-) angina+ dysrhythmias Atrial Fibrillation  Rhythm:Irregular Rate:Normal   '17 ETT - There was no ST segment deviation noted during stress. There were no adverse arrhythmias during exercise. QRS did not widen. Isolated PVC noted Low risk flecainide exercise challenge  '15 TTE - mildconcentric LVH. EF 55% to 60%. Severely dilated LA. PASP was mildly increased. PApeak pressure: 38 mmHg   Neuro/Psych negative neurological ROS  negative psych ROS   GI/Hepatic Neg liver ROS, GERD  Medicated and Controlled,  Endo/Other  diabetes, Type 2, Insulin Dependent, Oral Hypoglycemic AgentsMorbid obesity  Renal/GU  Hx nephrotic syndrome   negative genitourinary   Musculoskeletal  (+) Arthritis ,  Gout    Abdominal   Peds  Hematology negative hematology ROS (+)   Anesthesia Other Findings   Reproductive/Obstetrics                           Anesthesia Physical Anesthesia Plan  ASA: III  Anesthesia Plan: General   Post-op Pain Management:    Induction: Intravenous  PONV Risk Score and Plan: 2 and Treatment may vary due to age or medical condition, Ondansetron and Dexamethasone  Airway Management Planned: Oral ETT  Additional Equipment: None  Intra-op Plan:   Post-operative Plan: Extubation in OR  Informed Consent: I have reviewed the patients History and Physical,  chart, labs and discussed the procedure including the risks, benefits and alternatives for the proposed anesthesia with the patient or authorized representative who has indicated his/her understanding and acceptance.   Dental advisory given  Plan Discussed with: CRNA and Anesthesiologist  Anesthesia Plan Comments:        Anesthesia Quick Evaluation

## 2017-12-13 NOTE — H&P (Signed)
Mark Singh has presented today for surgery, with the diagnosis of atrial fibrillation.  The various methods of treatment have been discussed with the patient and family. After consideration of risks, benefits and other options for treatment, the patient has consented to  Procedure(s): Catheter ablation as a surgical intervention .  Risks include but not limited to bleeding, tamponade, heart block, stroke, damage to surrounding organs, among others. The patient's history has been reviewed, patient examined, no change in status, stable for surgery.  I have reviewed the patient's chart and labs.  Questions were answered to the patient's satisfaction.    Roddrick Sharron Curt Bears, MD 12/13/2017 7:08 AM

## 2017-12-13 NOTE — Progress Notes (Signed)
Site area: left groin fv sheaths x2 Site Prior to Removal:  Level 0 Pressure Applied For: 25 minutes Manual:   yes Patient Status During Pull:  stable Post Pull Site:  Level 0 Post Pull Instructions Given:  yes Post Pull Pulses Present: left dp palpable Dressing Applied:  Gauze and tegaderm Bedrest begins @ 1330 Comments:  IV flushed and saline locked left arm

## 2017-12-13 NOTE — Anesthesia Procedure Notes (Signed)
Procedure Name: Intubation Date/Time: 12/13/2017 8:47 AM Performed by: Lance Coon, CRNA Pre-anesthesia Checklist: Patient identified, Emergency Drugs available, Suction available, Patient being monitored and Timeout performed Patient Re-evaluated:Patient Re-evaluated prior to induction Oxygen Delivery Method: Circle system utilized Preoxygenation: Pre-oxygenation with 100% oxygen Induction Type: IV induction Ventilation: Mask ventilation without difficulty Laryngoscope Size: Miller and 3 Grade View: Grade II Tube type: Oral Tube size: 7.5 mm Number of attempts: 1 Airway Equipment and Method: Stylet Placement Confirmation: ETT inserted through vocal cords under direct vision,  positive ETCO2 and breath sounds checked- equal and bilateral Secured at: 22 cm Tube secured with: Tape Dental Injury: Teeth and Oropharynx as per pre-operative assessment

## 2017-12-13 NOTE — Anesthesia Postprocedure Evaluation (Signed)
Anesthesia Post Note  Patient: Mark Singh  Procedure(s) Performed: ATRIAL FIBRILLATION ABLATION (N/A )     Patient location during evaluation: PACU Anesthesia Type: General Level of consciousness: awake and alert Pain management: pain level controlled Vital Signs Assessment: post-procedure vital signs reviewed and stable Respiratory status: spontaneous breathing, nonlabored ventilation and respiratory function stable Cardiovascular status: blood pressure returned to baseline and stable Postop Assessment: no apparent nausea or vomiting Anesthetic complications: no    Last Vitals:  Vitals:   12/13/17 1320 12/13/17 1335  BP: 139/67 (!) 149/66  Pulse: 66 70  Resp: (!) 25 (!) 22  Temp:    SpO2: 97% 95%    Last Pain:  Vitals:   12/13/17 1228  TempSrc:   PainSc: [place] Quashaun Lazalde

## 2017-12-13 NOTE — Transfer of Care (Signed)
Immediate Anesthesia Transfer of Care Note  Patient: Mark Singh  Procedure(s) Performed: ATRIAL FIBRILLATION ABLATION (N/A )  Patient Location: Cath Lab  Anesthesia Type:General  Level of Consciousness: awake and patient cooperative  Airway & Oxygen Therapy: Patient Spontanous Breathing and Patient connected to nasal cannula oxygen  Post-op Assessment: Report given to RN and Post -op Vital signs reviewed and stable  Post vital signs: Reviewed and stable  Last Vitals:  Vitals Value Taken Time  BP 141/59 12/13/2017 11:47 AM  Temp 35.9 C 12/13/2017 11:50 AM  Pulse 62 12/13/2017 11:52 AM  Resp 24 12/13/2017 11:52 AM  SpO2 93 % 12/13/2017 11:52 AM  Vitals shown include unvalidated device data.  Last Pain:  Vitals:   12/13/17 1150  TempSrc: Temporal  PainSc: 0-No pain      Patients Stated Pain Goal: 5 (47/20/72 1828)  Complications: No apparent anesthesia complications

## 2017-12-13 NOTE — Progress Notes (Signed)
Two 9 Fr Femoral vein sheaths were removed, after manual pressure was held for 30 min. R groin is soft and non tender. Patient was instructed on the bed rest, and pressure hold in case of coughing or sneezing. Procedure was tolerated well.

## 2017-12-14 DIAGNOSIS — I481 Persistent atrial fibrillation: Secondary | ICD-10-CM | POA: Diagnosis not present

## 2017-12-14 DIAGNOSIS — I493 Ventricular premature depolarization: Secondary | ICD-10-CM | POA: Diagnosis not present

## 2017-12-14 DIAGNOSIS — I272 Pulmonary hypertension, unspecified: Secondary | ICD-10-CM | POA: Diagnosis not present

## 2017-12-14 DIAGNOSIS — I10 Essential (primary) hypertension: Secondary | ICD-10-CM | POA: Diagnosis not present

## 2017-12-14 LAB — GLUCOSE, CAPILLARY: Glucose-Capillary: 139 mg/dL — ABNORMAL HIGH (ref 70–99)

## 2017-12-14 NOTE — Progress Notes (Signed)
Right  Femoral site has a soft ecchymotic area that was slightly expanded from the previous marking, new marking drawn and the site has remained unchanged the rest of the night, soft with no s/s of complications to bilateral groins with good pulses, color, temp, sensation and circulation to bilateral lower extremities, will continue to monitor.

## 2017-12-14 NOTE — Discharge Summary (Addendum)
ELECTROPHYSIOLOGY PROCEDURE DISCHARGE SUMMARY    Patient ID: Mark Singh,  MRN: [id number], DOB/AGE: Dec 11, 1945 72 y.o.  Admit date: 12/13/2017 Discharge date: 12/14/2017  Primary Care Physician: Kathyrn Lass, MD  Primary Cardiologist: Dr. Marlou Porch Electrophysiologist: [name]  Primary Discharge Diagnosis:  1. Persistent AFib     CHA2DS2Vasc is 3, on xarelto  Secondary Discharge Diagnosis:  1. HTN 2. DM 3. Nephrotic syndrome (complete responder to steroid therapy by Dr. Lorrene Reid) 4. Obesity 5. PVCs     ablated  Procedures This Admission:  1.  Electrophysiology study and radiofrequency catheter ablation on 12/13/17 by Dr Curt Bears.   This study demonstrated 1. Atrial fibrillation upon presentation.   2. Successful electrical isolation and anatomical encircling of all four pulmonary veins with radiofrequency current.  A WACA approach was used 3. Additional left atrial ablation was performed across the roof of the left atrium 4. Atrial fibrillation successfully cardioverted to sinus rhythm. 5. No early apparent complications  Brief HPI: Mark Singh is a 72 y.o. male with a history of persistent atrial fibrillation.  He has failed medical therapy with Flecainide. Risks, benefits, and alternatives to catheter ablation of atrial fibrillation were reviewed with the patient who wished to proceed.  The patient underwent cardiac CT prior to the procedure which demonstrated normal LV function and no LAA thrombus.    Hospital Course:  The patient was admitted and underwent EPS/RFCA of atrial fibrillation with details as outlined above.  The patient was monitored on telemetry overnight which demonstrated SR.  B/l groins are without complication on the day of discharge.  The patient feels well, no CP or SOB, he was examined and considered to be stable for discharge.  Wound care and restrictions were reviewed with the patient.  The patient Mark Singh be seen back by Roderic Palau, NP in 4 weeks and Dr Curt Bears in 12 weeks for post ablation follow up.     Physical Exam: Vitals:   12/13/17 1857 12/13/17 2000 12/14/17 0636 12/14/17 0829  BP: (!) 123/59 121/62 (!) 145/74   Pulse:  72 77   Resp: 19 (!) 24 (!) 21   Temp:  98.1 F (36.7 C) 98.2 F (36.8 C)   TempSrc:  Oral Oral   SpO2:  97% 97% 96%  Weight:   120.8 kg   Height:        GEN- The patient is well appearing, alert and oriented x 3 today.   HEENT: normocephalic, atraumatic; sclera clear, conjunctiva pink; hearing intact; oropharynx clear; neck supple  Lungs- CTA b/l, normal work of breathing.  No wheezes, rales, rhonchi Heart- RRR, no murmurs, rubs or gallops  GI- soft, non-tender, non-distended, bowel sounds present  Extremities- no clubbing, cyanosis, or edema; R groin has small area of ecchymosis, no hematoma/bruit, Left groin is without ecchymosis or hematoma/bruit MS- no significant deformity or atrophy Skin- warm and dry, no rash or lesion Psych- euthymic mood, full affect Neuro- strength and sensation are intact   Labs:   Lab Results  Component Value Date   WBC 6.5 11/27/2017   HGB 14.1 11/27/2017   HCT 41.6 11/27/2017   MCV 88 11/27/2017   PLT 306 11/27/2017   No results for input(s): NA, K, CL, CO2, BUN, CREATININE, CALCIUM, PROT, BILITOT, ALKPHOS, ALT, AST, GLUCOSE in the last 168 hours.  Invalid input(s): LABALBU   Discharge Medications:  Allergies as of 12/14/2017      Reactions   Indomethacin Other (See Comments)   dizziness  Medication List    TAKE these medications   insulin glargine 100 UNIT/ML injection Commonly known as:  LANTUS Inject 28 Units into the skin at bedtime.   metFORMIN 1000 MG tablet Commonly known as:  GLUCOPHAGE Take 1,000 mg by mouth 2 (two) times daily.   metoprolol succinate 100 MG 24 hr tablet Commonly known as:  TOPROL-XL Take 1 tablet (100 mg total) by mouth daily. Take with or immediately following a meal.   omeprazole 20  MG capsule Commonly known as:  PRILOSEC Take 20 mg by mouth daily.   pimecrolimus 1 % cream Commonly known as:  ELIDEL Apply 1 application topically 2 (two) times daily as needed (psoriasis).   probenecid 500 MG tablet Commonly known as:  BENEMID Take 500 mg by mouth daily.   simvastatin 40 MG tablet Commonly known as:  ZOCOR Take 40 mg by mouth daily with supper.   tamsulosin 0.4 MG Caps capsule Commonly known as:  FLOMAX Take 0.4 mg by mouth daily.   vitamin B-12 1000 MCG tablet Commonly known as:  CYANOCOBALAMIN Take 1,000 mcg by mouth daily.   Vitamin D3 5000 units Caps Take 5,000 Units by mouth daily.   WIXELA INHUB 100-50 MCG/DOSE Aepb Generic drug:  Fluticasone-Salmeterol Inhale 1 puff into the lungs 2 (two) times daily.   XARELTO 20 MG Tabs tablet Generic drug:  rivaroxaban TAKE 1 TABLET (20 MG TOTAL) BY MOUTH DAILY WITH SUPPER.       Disposition:  Home  Discharge Instructions    Diet - low sodium heart healthy   Complete by:  As directed    Increase activity slowly   Complete by:  As directed      Follow-up Information    [facility] Follow up on 01/10/2018.   Specialty:  Cardiology Why:  11:00AM Contact information: [address] [id number] mc [address] [place] [address] [phone]       Constance Haw, MD Follow up on 03/19/2018.   Specialty:  Cardiology Why:  11:30AM Contact information: [place] [address] [phone]           Duration of Discharge Encounter: Greater than 30 minutes including physician time.  Signed, Tommye Standard, PA-C 12/14/2017 10:04 AM   I have seen and examined this patient with Tommye Standard.  Agree with above, note added to reflect my findings.  On exam, RRR, no murmurs, lungs clear.  Patient had AF ablation for persistent atrial fibrillation. Tolerated the procedure well without complaint this morning. Plan for discharge today with  follow up in EP clinic.  Ezzard Ditmer M. Nahiara Kretzschmar MD 12/14/2017 12:12 PM

## 2017-12-14 NOTE — Discharge Instructions (Signed)
Post procedure care instructions No driving for 4 days. No lifting over 5 lbs for 1 week. No vigorous or sexual activity for 1 week. You may return to work on 12/20/17. Keep procedure site clean & dry. If you notice increased pain, swelling, bleeding or pus, call/return!  You may shower, but no soaking baths/hot tubs/pools for 1 week.     You have an appointment set up with the [facility].  Multiple studies have shown that being followed by a dedicated atrial fibrillation clinic in addition to the standard care you receive from your other physicians improves health. We believe that enrollment in the atrial fibrillation clinic will allow [place] to better care for you.   The phone number to the [facility] is [phone]. The clinic is staffed Monday through Friday from 8:30am to 5pm.  Parking Directions: The clinic is located in the Heart and Vascular Building connected to [facility]. 1)From [address] turn on to [address] and go to the 3rd entrance  (Heart and Vascular entrance) on the right. 2)Look to the right for Heart &Vascular Parking Garage. 3)A code for the entrance is required for August is 1500   4)Take the elevators to the 1st floor. Registration is in the room with the glass walls at the end of the hallway.  If you have any trouble parking or locating the clinic, please dont hesitate to call [phone].

## 2018-01-10 ENCOUNTER — Encounter (HOSPITAL_COMMUNITY): Payer: Self-pay | Admitting: Nurse Practitioner

## 2018-01-10 ENCOUNTER — Ambulatory Visit (HOSPITAL_COMMUNITY)
Admission: RE | Admit: 2018-01-10 | Discharge: 2018-01-10 | Disposition: A | Discharge status: Home / Self Care | Payer: Medicare Other | Source: Ambulatory Visit | Attending: Nurse Practitioner | Admitting: Nurse Practitioner

## 2018-01-10 VITALS — BP 132/76 | HR 70 | Ht 69.0 in | Wt 267.0 lb

## 2018-01-10 DIAGNOSIS — I10 Essential (primary) hypertension: Secondary | ICD-10-CM | POA: Insufficient documentation

## 2018-01-10 DIAGNOSIS — I483 Typical atrial flutter: Secondary | ICD-10-CM | POA: Diagnosis not present

## 2018-01-10 DIAGNOSIS — E669 Obesity, unspecified: Secondary | ICD-10-CM | POA: Insufficient documentation

## 2018-01-10 DIAGNOSIS — M109 Gout, unspecified: Secondary | ICD-10-CM | POA: Insufficient documentation

## 2018-01-10 DIAGNOSIS — E785 Hyperlipidemia, unspecified: Secondary | ICD-10-CM | POA: Diagnosis not present

## 2018-01-10 DIAGNOSIS — M1990 Unspecified osteoarthritis, unspecified site: Secondary | ICD-10-CM | POA: Insufficient documentation

## 2018-01-10 DIAGNOSIS — I4892 Unspecified atrial flutter: Secondary | ICD-10-CM | POA: Insufficient documentation

## 2018-01-10 DIAGNOSIS — Z794 Long term (current) use of insulin: Secondary | ICD-10-CM | POA: Insufficient documentation

## 2018-01-10 DIAGNOSIS — Z7901 Long term (current) use of anticoagulants: Secondary | ICD-10-CM | POA: Insufficient documentation

## 2018-01-10 DIAGNOSIS — E119 Type 2 diabetes mellitus without complications: Secondary | ICD-10-CM | POA: Insufficient documentation

## 2018-01-10 DIAGNOSIS — Z6839 Body mass index (BMI) 39.0-39.9, adult: Secondary | ICD-10-CM | POA: Diagnosis not present

## 2018-01-10 DIAGNOSIS — I4891 Unspecified atrial fibrillation: Secondary | ICD-10-CM | POA: Diagnosis present

## 2018-01-10 DIAGNOSIS — Z79899 Other long term (current) drug therapy: Secondary | ICD-10-CM | POA: Diagnosis not present

## 2018-01-10 DIAGNOSIS — I272 Pulmonary hypertension, unspecified: Secondary | ICD-10-CM | POA: Insufficient documentation

## 2018-01-10 NOTE — Progress Notes (Signed)
Primary Care Physician: Kathyrn Lass, MD Referring Physician: Dr. Toma Aran is a 72 y.o. male with a h/o PVC ablation, afib s/p flecainide, stopped 2/2 being ineffective, and afib ablation 8/14. He feels the best he has in some time but unfortunately today, he is in atrial  flutter at 70 bpm. He is totally unaware. His heart rates per his wearable have been regular and in the 70's, as his ekg is today. He denies any  swallowing issues or groin issues.   Today, he denies symptoms of palpitations, chest pain, shortness of breath, orthopnea, PND, lower extremity edema, dizziness, presyncope, syncope, or neurologic sequela. The patient is tolerating medications without difficulties and is otherwise without complaint today.   Past Medical History:  Diagnosis Date  . Arthritis    "hands, feet" (12/13/2017)  . Asthma   . Bradycardia    a. Repoted h/o HR in the 40s.  . Edema    a. 2D echo 11/2013: EF 42-68%, LV diastolic function parameters were normal, severely dilated LA, PASP 62mmHg..  . H/O: gout    right foot; on daily RX" (12/13/2017)  . HTN (hypertension)   . Hyperlipidemia   . Myalgia and myositis, unspecified   . Nephrotic syndrome    a. responder to steroid therapy by Dr. Lorrene Reid.   . Obesity   . Orthostatic hypotension   . Penile cancer ([place])    "did circ"  . Pulmonary hypertension ([place])    a. Mild pulm HTN felt 2/2 obesity.  [name] PVC's (premature ventricular contractions)    a. Holter 05/2013 showing 18k PVCs (18% of the time).  . Seasonal asthma    mild  . Shoulder pain   . Squamous carcinoma    "right hand; left shoulder" (12/13/2017)  . Type 2 diabetes mellitus ([place])    Past Surgical History:  Procedure Laterality Date  . ATRIAL FIBRILLATION ABLATION N/A 12/13/2017   Procedure: ATRIAL FIBRILLATION ABLATION;  Surgeon: Constance Haw, MD;  Location: [place] [facility];  Service: Cardiovascular;  Laterality: N/A;  . BUNIONECTOMY WITH HAMMERTOE  RECONSTRUCTION Left 1992  &  2008   REMOVAL HEAL SPUR/ BUNIONECTOMY AND HAMMERTOE REPAIR   . CARDIOVERSION N/A 07/22/2015   Procedure: CARDIOVERSION;  Surgeon: Dorothy Spark, MD;  Location: [place];  Service: Cardiovascular;  Laterality: N/A;  . CARDIOVERSION N/A 10/06/2015   Procedure: CARDIOVERSION;  Surgeon: Sueanne Margarita, MD;  Location: [facility];  Service: Cardiovascular;  Laterality: N/A;  . CARDIOVERSION N/A 12/05/2016   Procedure: CARDIOVERSION;  Surgeon: Pixie Casino, MD;  Location: [facility] ENDOSCOPY;  Service: Cardiovascular;  Laterality: N/A;  . CIRCUMCISION N/A 11/27/2012   Procedure:  CIRCUMCISION  AND EXCISION OF GLANS PENIS;  Surgeon: Fredricka Bonine, MD;  Location: [facility];  Service: Urology;  Laterality: N/A;  . ELECTROPHYSIOLOGIC STUDY N/A 03/30/2015   Procedure: PVC Ablation;  Surgeon: [name], MD;  Location: [place] [facility];  Service: Cardiovascular;  Laterality: N/A;  . HERNIA REPAIR    . SQUAMOUS CELL CARCINOMA EXCISION     "right hand; left shoulder" (12/13/2017)  . UMBILICAL HERNIA REPAIR  11-04-2005    Current Outpatient Medications  Medication Sig Dispense Refill  . Cholecalciferol (VITAMIN D3) 5000 units CAPS Take 5,000 Units by mouth daily.     . Fluticasone-Salmeterol (WIXELA INHUB) 100-50 MCG/DOSE AEPB Inhale 1 puff into the lungs 2 (two) times daily.    . insulin glargine (LANTUS) 100 UNIT/ML injection Inject  28 Units into the skin at bedtime.    [name] losartan (COZAAR) 50 MG tablet Take 50 mg by mouth daily.  3  . metFORMIN (GLUCOPHAGE) 1000 MG tablet Take 1,000 mg by mouth 2 (two) times daily.  0  . metoprolol succinate (TOPROL-XL) 100 MG 24 hr tablet Take 1 tablet (100 mg total) by mouth daily. Take with or immediately following a meal. 90 tablet 3  . omeprazole (PRILOSEC) 20 MG capsule Take 20 mg by mouth daily.  0  . pimecrolimus (ELIDEL) 1 % cream Apply 1 application topically 2 (two) times daily as needed  (psoriasis).     . probenecid (BENEMID) 500 MG tablet Take 500 mg by mouth daily.     . simvastatin (ZOCOR) 40 MG tablet Take 40 mg by mouth daily with supper.     . tamsulosin (FLOMAX) 0.4 MG CAPS capsule Take 0.4 mg by mouth daily.  4  . vitamin B-12 (CYANOCOBALAMIN) 1000 MCG tablet Take 1,000 mcg by mouth daily.    Alveda Reasons 20 MG TABS tablet TAKE 1 TABLET (20 MG TOTAL) BY MOUTH DAILY WITH SUPPER. 30 tablet 5   No current facility-administered medications for this encounter.     Allergies  Allergen Reactions  . Indomethacin Other (See Comments)    dizziness    Social History   Socioeconomic History  . Marital status: Widowed    Spouse name: Not on file  . Number of children: Not on file  . Years of education: Not on file  . Highest education level: Not on file  Occupational History  . Not on file  Social Needs  . Financial resource strain: Not on file  . Food insecurity:    Worry: Not on file    Inability: Not on file  . Transportation needs:    Medical: Not on file    Non-medical: Not on file  Tobacco Use  . Smoking status: Never Smoker  . Smokeless tobacco: Never Used  Substance and Sexual Activity  . Alcohol use: Never    Frequency: Never  . Drug use: Never  . Sexual activity: Not Currently  Lifestyle  . Physical activity:    Days per week: Not on file    Minutes per session: Not on file  . Stress: Not on file  Relationships  . Social connections:    Talks on phone: Not on file    Gets together: Not on file    Attends religious service: Not on file    Active member of club or organization: Not on file    Attends meetings of clubs or organizations: Not on file    Relationship status: Not on file  . Intimate partner violence:    Fear of current or ex partner: Not on file    Emotionally abused: Not on file    Physically abused: Not on file    Forced sexual activity: Not on file  Other Topics Concern  . Not on file  Social History Narrative  . Not on  file    Family History  Problem Relation Age of Onset  . Coronary artery disease Mother   . CVA Father     ROS- All systems are reviewed and negative except as per the HPI above  Physical Exam: Vitals:   01/10/18 1054  BP: 132/76  Pulse: 70  Weight: 121.1 kg  Height: 5\' 9"  (1.753 m)   Wt Readings from Last 3 Encounters:  01/10/18 121.1 kg  12/14/17 120.8 kg  11/27/17  124.3 kg    Labs: Lab Results  Component Value Date   NA 139 11/27/2017   K 5.2 11/27/2017   CL 100 11/27/2017   CO2 27 11/27/2017   GLUCOSE 133 (H) 11/27/2017   BUN 13 11/27/2017   CREATININE 1.27 11/27/2017   CALCIUM 9.1 11/27/2017   MG 1.9 02/03/2015   Lab Results  Component Value Date   INR 1.0 05/16/2008   No results found for: CHOL, HDL, LDLCALC, TRIG   GEN- The patient is well appearing, alert and oriented x 3 today.   Head- normocephalic, atraumatic Eyes-  Sclera clear, conjunctiva pink Ears- hearing intact Oropharynx- clear Neck- supple, no JVP Lymph- no cervical lymphadenopathy Lungs- Clear to ausculation bilaterally, normal work of breathing Heart- Regular rate and rhythm, no murmurs, rubs or gallops, PMI not laterally displaced GI- soft, NT, ND, + BS Extremities- no clubbing, cyanosis, or edema MS- no significant deformity or atrophy Skin- no rash or lesion Psych- euthymic mood, full affect Neuro- strength and sensation are intact  EKG- Aflutter at 70 bpm, qrs int 96 ms, qtc at 462 ms ( ekg reads Acute MI, is reading flutter waves, does not fit clinical picture) Epic records reviewed    Assessment and Plan: 1. Afib ablation Is in atrial flutter, unaware He feels great Continue current dose of BB Discussed cardioversion today  but he would like to wait Will  bring next week for repeat EKG to see if arrhythmia persists and discuss cardioversion again  Continue xarelto 20 mg daily, states no missed doses  Butch Penny C. Carroll, [facility] [address] [place], [place] [address] [phone]

## 2018-01-19 ENCOUNTER — Encounter (HOSPITAL_COMMUNITY): Payer: Self-pay | Admitting: Nurse Practitioner

## 2018-01-19 ENCOUNTER — Ambulatory Visit (HOSPITAL_COMMUNITY)
Admission: RE | Admit: 2018-01-19 | Discharge: 2018-01-19 | Disposition: A | Discharge status: Home / Self Care | Payer: Medicare Other | Source: Ambulatory Visit | Attending: Nurse Practitioner | Admitting: Nurse Practitioner

## 2018-01-19 DIAGNOSIS — I483 Typical atrial flutter: Secondary | ICD-10-CM | POA: Diagnosis not present

## 2018-01-19 DIAGNOSIS — R9431 Abnormal electrocardiogram [ECG] [EKG]: Secondary | ICD-10-CM | POA: Insufficient documentation

## 2018-01-19 NOTE — Progress Notes (Signed)
In for ekg as last one showed typical atrial flutter. He is 5-6 weeks from afib ablation. He feels great, is not symptomatic, rate controlled. I spoke to [name] last week and he mentioned that he would consider doing an aflutter ablation . Pt is willing to discuss with [name]. Appointment requested.

## 2018-01-23 NOTE — Addendum Note (Signed)
Encounter addended by: Sherran Needs, NP on: 01/23/2018 10:27 AM  Actions taken: LOS modified, Visit diagnoses modified

## 2018-01-29 ENCOUNTER — Encounter: Payer: Self-pay | Admitting: Cardiology

## 2018-01-29 ENCOUNTER — Ambulatory Visit: Payer: Medicare Other | Admitting: Cardiology

## 2018-01-29 VITALS — BP 124/64 | HR 69 | Ht 69.0 in | Wt 266.0 lb

## 2018-01-29 DIAGNOSIS — J45909 Unspecified asthma, uncomplicated: Secondary | ICD-10-CM | POA: Insufficient documentation

## 2018-01-29 DIAGNOSIS — I481 Persistent atrial fibrillation: Secondary | ICD-10-CM

## 2018-01-29 DIAGNOSIS — I483 Typical atrial flutter: Secondary | ICD-10-CM | POA: Diagnosis not present

## 2018-01-29 DIAGNOSIS — N189 Chronic kidney disease, unspecified: Secondary | ICD-10-CM | POA: Insufficient documentation

## 2018-01-29 DIAGNOSIS — M109 Gout, unspecified: Secondary | ICD-10-CM | POA: Insufficient documentation

## 2018-01-29 DIAGNOSIS — C609 Malignant neoplasm of penis, unspecified: Secondary | ICD-10-CM | POA: Insufficient documentation

## 2018-01-29 DIAGNOSIS — I493 Ventricular premature depolarization: Secondary | ICD-10-CM | POA: Diagnosis not present

## 2018-01-29 DIAGNOSIS — N184 Chronic kidney disease, stage 4 (severe): Secondary | ICD-10-CM | POA: Insufficient documentation

## 2018-01-29 DIAGNOSIS — I4819 Other persistent atrial fibrillation: Secondary | ICD-10-CM

## 2018-01-29 NOTE — Progress Notes (Signed)
Electrophysiology Office Note   Date:  01/29/2018   ID:  Mark Singh, DOB [birth year], MRN [id number]  PCP:  Kathyrn Lass, MD  Cardiologist:  Marlou Porch Primary Electrophysiologist:  Trinady Milewski Meredith Leeds, MD    No chief complaint on file.    History of Present Illness: Mark Singh is a 72 y.o. male who presents today for electrophysiology evaluation.   history of PVC's, bradycardia, nephrotic syndrome (complete responder to steroid therapy by Dr. Lorrene Reid), DM, HTN, HLD, obesity, orthostatic hypotension, and mild pulm HTN felt 2/2 obesity Comes in today now s/p ablation procedure for his PVC's.  PVCs were mapped to inferior to the [facility] and successfully ablated on 03/30/15.  He subsequently went into atrial fibrillation and had ablation 12/13/2017.  He subsequently presented back to atrial fibrillation clinic in atrial flutter.  Today, denies symptoms of palpitations, chest pain, shortness of breath, orthopnea, PND, lower extremity edema, claudication, dizziness, presyncope, syncope, bleeding, or neurologic sequela. The patient is tolerating medications without difficulties.  He is overall feeling much improved since his ablation.  He no longer has significant weakness and fatigue.  He is starting to exercise.   Past Medical History:  Diagnosis Date  . Arthritis    "hands, feet" (12/13/2017)  . Asthma   . Bradycardia    a. Repoted h/o HR in the 40s.  . Edema    a. 2D echo 11/2013: EF 59-16%, LV diastolic function parameters were normal, severely dilated LA, PASP 29mmHg..  . H/O: gout    right foot; on daily RX" (12/13/2017)  . HTN (hypertension)   . Hyperlipidemia   . Myalgia and myositis, unspecified   . Nephrotic syndrome    a. responder to steroid therapy by Dr. Lorrene Reid.   . Obesity   . Orthostatic hypotension   . Penile cancer ([place])    "did circ"  . Pulmonary hypertension ([place])    a. Mild pulm HTN felt 2/2 obesity.  [name] PVC's (premature ventricular contractions)      a. Holter 05/2013 showing 18k PVCs (18% of the time).  . Seasonal asthma    mild  . Shoulder pain   . Squamous carcinoma    "right hand; left shoulder" (12/13/2017)  . Type 2 diabetes mellitus ([place])    Past Surgical History:  Procedure Laterality Date  . ATRIAL FIBRILLATION ABLATION N/A 12/13/2017   Procedure: ATRIAL FIBRILLATION ABLATION;  Surgeon: Constance Haw, MD;  Location: [place] [facility];  Service: Cardiovascular;  Laterality: N/A;  . BUNIONECTOMY WITH HAMMERTOE RECONSTRUCTION Left 1992  &  2008   REMOVAL HEAL SPUR/ BUNIONECTOMY AND HAMMERTOE REPAIR   . CARDIOVERSION N/A 07/22/2015   Procedure: CARDIOVERSION;  Surgeon: Dorothy Spark, MD;  Location: [place];  Service: Cardiovascular;  Laterality: N/A;  . CARDIOVERSION N/A 10/06/2015   Procedure: CARDIOVERSION;  Surgeon: Sueanne Margarita, MD;  Location: [facility] ENDOSCOPY;  Service: Cardiovascular;  Laterality: N/A;  . CARDIOVERSION N/A 12/05/2016   Procedure: CARDIOVERSION;  Surgeon: Pixie Casino, MD;  Location: [facility] ENDOSCOPY;  Service: Cardiovascular;  Laterality: N/A;  . CIRCUMCISION N/A 11/27/2012   Procedure:  CIRCUMCISION  AND EXCISION OF GLANS PENIS;  Surgeon: Fredricka Bonine, MD;  Location: [facility];  Service: Urology;  Laterality: N/A;  . ELECTROPHYSIOLOGIC STUDY N/A 03/30/2015   Procedure: PVC Ablation;  Surgeon: Hava Massingale Meredith Leeds, MD;  Location: [place] [facility];  Service: Cardiovascular;  Laterality: N/A;  . HERNIA REPAIR    . SQUAMOUS CELL CARCINOMA EXCISION     "  right hand; left shoulder" (12/13/2017)  . UMBILICAL HERNIA REPAIR  11-04-2005     Current Outpatient Medications  Medication Sig Dispense Refill  . Cholecalciferol (VITAMIN D3) 5000 units CAPS Take 5,000 Units by mouth daily.     . Fluticasone-Salmeterol (WIXELA INHUB) 100-50 MCG/DOSE AEPB Inhale 1 puff into the lungs 2 (two) times daily.    . insulin glargine (LANTUS) 100 UNIT/ML injection Inject 28 Units into the  skin at bedtime.    [name] losartan (COZAAR) 50 MG tablet Take 50 mg by mouth daily.  3  . metFORMIN (GLUCOPHAGE) 1000 MG tablet Take 1,000 mg by mouth 2 (two) times daily.  0  . omeprazole (PRILOSEC) 20 MG capsule Take 20 mg by mouth daily.  0  . pimecrolimus (ELIDEL) 1 % cream Apply 1 application topically 2 (two) times daily as needed (psoriasis).     . probenecid (BENEMID) 500 MG tablet Take 500 mg by mouth daily.     . simvastatin (ZOCOR) 40 MG tablet Take 40 mg by mouth daily with supper.     . tamsulosin (FLOMAX) 0.4 MG CAPS capsule Take 0.4 mg by mouth daily.  4  . vitamin B-12 (CYANOCOBALAMIN) 1000 MCG tablet Take 1,000 mcg by mouth daily.    Alveda Reasons 20 MG TABS tablet TAKE 1 TABLET (20 MG TOTAL) BY MOUTH DAILY WITH SUPPER. 30 tablet 5  . metoprolol succinate (TOPROL-XL) 100 MG 24 hr tablet Take 1 tablet (100 mg total) by mouth daily. Take with or immediately following a meal. 90 tablet 3   No current facility-administered medications for this visit.     Allergies:   Indomethacin   Social History:  The patient  reports that he has never smoked. He has never used smokeless tobacco. He reports that he does not drink alcohol or use drugs.   Family History:  The patient's family history includes CVA in his father; Coronary artery disease in his mother.   ROS:  Please see the history of present illness.   Otherwise, review of systems is positive for none.   All other systems are reviewed and negative.   PHYSICAL EXAM: VS:  BP 124/64   Pulse 69   Ht 5\' 9"  (1.753 m)   Wt 266 lb (120.7 kg)   SpO2 94%   BMI 39.28 kg/m  , BMI Body mass index is 39.28 kg/m. GEN: Well nourished, well developed, in no acute distress  HEENT: normal  Neck: no JVD, carotid bruits, or masses Cardiac: RRR; no murmurs, rubs, or gallops,no edema  Respiratory:  clear to auscultation bilaterally, normal work of breathing GI: soft, nontender, nondistended, + BS MS: no deformity or atrophy  Skin: warm and  dry Neuro:  Strength and sensation are intact Psych: euthymic mood, full affect  EKG:  EKG is not ordered today. Personal review of the ekg ordered 01/19/18 shows atrial flutter, rate 68    Recent Labs: 11/27/2017: BUN 13; Creatinine, Ser 1.27; Hemoglobin 14.1; Platelets 306; Potassium 5.2; Sodium 139    Lipid Panel  No results found for: CHOL, TRIG, HDL, CHOLHDL, VLDL, LDLCALC, LDLDIRECT   Wt Readings from Last 3 Encounters:  01/29/18 266 lb (120.7 kg)  01/10/18 267 lb (121.1 kg)  12/14/17 266 lb 6.4 oz (120.8 kg)      Other studies Reviewed: Additional studies/ records that were reviewed today include: TTE 12/28/14  Review of the above records today demonstrates:  - Left ventricle: The cavity size was normal. There was mild concentric hypertrophy. Systolic  function was normal. The estimated ejection fraction was in the range of 55% to 60%. Although no diagnostic regional wall motion abnormality was identified, this possibility cannot be completely excluded on the basis of this study. Left ventricular diastolic function parameters were normal. - Left atrium: The atrium was severely dilated. - Pulmonary arteries: Systolic pressure was mildly increased. PA peak pressure: 38 mm Hg (S).   ASSESSMENT AND PLAN:  1.  PVCs: Ablation 03/2015.  No further PVCs.  2.  Persistent atrial fibrillation: Currently on Xarelto.  Had atrial fibrillation ablation 12/13/2017.  Unfortunately return to clinic in atrial flutter.  This patients CHA2DS2-VASc Score and unadjusted Ischemic Stroke Rate (% per year) is equal to 3.2 % stroke rate/year from a score of 3  Above score calculated as 1 point each if present [CHF, HTN, DM, Vascular=MI/PAD/Aortic Plaque, Age if 65-74, or Male] Above score calculated as 2 points each if present [Age > 75, or Stroke/TIA/TE]  3.  Typical atrial flutter: Found on check after atrial fibrillation ablation.  I offered cardioversion versus repeat  ablation.  Risks and benefits were discussed and include bleeding, tamponade, heart block, and stroke.  The patient understands the risks and is agreed to ablation.  Current medicines are reviewed at length with the patient today.   The patient does not have concerns regarding his medicines.  The following changes were made today: none  Labs/ tests ordered today include:  No orders of the defined types were placed in this encounter.    Disposition:   FU with Firas Guardado 3 months  Signed, Latressa Harries Meredith Leeds, MD  01/29/2018 3:31 PM     [place] [address] [place] [address] [phone] (office) [phone] (fax)

## 2018-01-29 NOTE — Patient Instructions (Signed)
Medication Instructions:  Your physician recommends that you continue on your current medications as directed. Please refer to the Current Medication list given to you today.  * If you need a refill on your cardiac medications before your next appointment, please call your pharmacy.   Labwork: None ordered  Testing/Procedures: Your physician has recommended that you have an ablation. Catheter ablation is a medical procedure used to treat some cardiac arrhythmias (irregular heartbeats). During catheter ablation, a long, thin, flexible tube is put into a blood vessel in your groin (upper thigh), or neck. This tube is called an ablation catheter. It is then guided to your heart through the blood vessel. Radio frequency waves destroy small areas of heart tissue where abnormal heartbeats may cause an arrhythmia to start.    Skip Litke, RN will call you to arrange this procedure.  We will hold 10/25 for  your ablation date.  Follow-Up: To be determined after ablation is scheduled.   Thank you for choosing [facility]!!   [name], RN [phone]

## 2018-02-05 ENCOUNTER — Encounter: Payer: Self-pay | Admitting: *Deleted

## 2018-02-05 ENCOUNTER — Telehealth: Payer: Self-pay | Admitting: *Deleted

## 2018-02-05 DIAGNOSIS — I483 Typical atrial flutter: Secondary | ICD-10-CM

## 2018-02-05 DIAGNOSIS — Z01812 Encounter for preprocedural laboratory examination: Secondary | ICD-10-CM

## 2018-02-05 NOTE — Telephone Encounter (Signed)
AFlutter ablation scheduled for 10/25. Instructions reviewed w/ pt and letter left at front desk for pt to pick up Friday when he comes by for lab work. Post ablation f/u appt made. Patient verbalized understanding and agreeable to plan.

## 2018-02-09 ENCOUNTER — Other Ambulatory Visit: Payer: Medicare Other | Admitting: *Deleted

## 2018-02-09 DIAGNOSIS — I483 Typical atrial flutter: Secondary | ICD-10-CM

## 2018-02-09 DIAGNOSIS — Z01812 Encounter for preprocedural laboratory examination: Secondary | ICD-10-CM

## 2018-02-09 LAB — BASIC METABOLIC PANEL WITH GFR
BUN/Creatinine Ratio: 11 (ref 10–24)
BUN: 14 mg/dL (ref 8–27)
CO2: 26 mmol/L (ref 20–29)
Calcium: 9.6 mg/dL (ref 8.6–10.2)
Chloride: 100 mmol/L (ref 96–106)
Creatinine, Ser: 1.24 mg/dL (ref 0.76–1.27)
GFR calc Af Amer: 67 mL/min/{1.73_m2}
GFR calc non Af Amer: 58 mL/min/{1.73_m2} — ABNORMAL LOW
Glucose: 102 mg/dL — ABNORMAL HIGH (ref 65–99)
Potassium: 5 mmol/L (ref 3.5–5.2)
Sodium: 141 mmol/L (ref 134–144)

## 2018-02-09 LAB — CBC
Hematocrit: 43.5 % (ref 37.5–51.0)
Hemoglobin: 15 g/dL (ref 13.0–17.7)
MCH: 29.6 pg (ref 26.6–33.0)
MCHC: 34.5 g/dL (ref 31.5–35.7)
MCV: 86 fL (ref 79–97)
Platelets: 312 10*3/uL (ref 150–450)
RBC: 5.07 x10E6/uL (ref 4.14–5.80)
RDW: 15.5 % — ABNORMAL HIGH (ref 12.3–15.4)
WBC: 6.2 10*3/uL (ref 3.4–10.8)

## 2018-02-21 ENCOUNTER — Other Ambulatory Visit: Payer: Self-pay | Admitting: Cardiology

## 2018-02-21 NOTE — Telephone Encounter (Signed)
Xarelto 20mg  refill request received; pt is 72 yrs old, wt-120.7kg, Crea-1.24 on 02/09/2018, last seen by [facility] on 01/29/18, CrCl-67.1ml/min; will send in refill to requested pharmacy.

## 2018-02-22 NOTE — Telephone Encounter (Addendum)
Informed pt of schedule change in the hospital tomorrow. Pt advised to arrive at 7:00 am for his procedure. Pt agreeable to plan.

## 2018-02-23 ENCOUNTER — Encounter (HOSPITAL_COMMUNITY): Payer: Self-pay | Admitting: Certified Registered Nurse Anesthetist

## 2018-02-23 ENCOUNTER — Ambulatory Visit (HOSPITAL_COMMUNITY): Payer: Medicare Other | Admitting: Certified Registered Nurse Anesthetist

## 2018-02-23 ENCOUNTER — Ambulatory Visit (HOSPITAL_COMMUNITY)
Admission: RE | Admit: 2018-02-23 | Discharge: 2018-02-23 | Disposition: A | Discharge status: Home / Self Care | Payer: Medicare Other | Source: Ambulatory Visit | Attending: Cardiology | Admitting: Cardiology

## 2018-02-23 ENCOUNTER — Encounter (HOSPITAL_COMMUNITY)
Admission: RE | Disposition: A | Discharge status: Home / Self Care | Payer: Self-pay | Source: Ambulatory Visit | Attending: Cardiology

## 2018-02-23 DIAGNOSIS — M1990 Unspecified osteoarthritis, unspecified site: Secondary | ICD-10-CM | POA: Insufficient documentation

## 2018-02-23 DIAGNOSIS — I483 Typical atrial flutter: Secondary | ICD-10-CM | POA: Insufficient documentation

## 2018-02-23 DIAGNOSIS — E669 Obesity, unspecified: Secondary | ICD-10-CM | POA: Insufficient documentation

## 2018-02-23 DIAGNOSIS — Z8249 Family history of ischemic heart disease and other diseases of the circulatory system: Secondary | ICD-10-CM | POA: Insufficient documentation

## 2018-02-23 DIAGNOSIS — I10 Essential (primary) hypertension: Secondary | ICD-10-CM | POA: Insufficient documentation

## 2018-02-23 DIAGNOSIS — I951 Orthostatic hypotension: Secondary | ICD-10-CM | POA: Insufficient documentation

## 2018-02-23 DIAGNOSIS — I272 Pulmonary hypertension, unspecified: Secondary | ICD-10-CM | POA: Diagnosis not present

## 2018-02-23 DIAGNOSIS — E119 Type 2 diabetes mellitus without complications: Secondary | ICD-10-CM | POA: Diagnosis not present

## 2018-02-23 DIAGNOSIS — I4892 Unspecified atrial flutter: Secondary | ICD-10-CM

## 2018-02-23 DIAGNOSIS — Z7901 Long term (current) use of anticoagulants: Secondary | ICD-10-CM | POA: Insufficient documentation

## 2018-02-23 DIAGNOSIS — M109 Gout, unspecified: Secondary | ICD-10-CM | POA: Insufficient documentation

## 2018-02-23 DIAGNOSIS — Z6839 Body mass index (BMI) 39.0-39.9, adult: Secondary | ICD-10-CM | POA: Insufficient documentation

## 2018-02-23 DIAGNOSIS — Z823 Family history of stroke: Secondary | ICD-10-CM | POA: Diagnosis not present

## 2018-02-23 DIAGNOSIS — I4819 Other persistent atrial fibrillation: Secondary | ICD-10-CM | POA: Diagnosis not present

## 2018-02-23 DIAGNOSIS — E785 Hyperlipidemia, unspecified: Secondary | ICD-10-CM | POA: Insufficient documentation

## 2018-02-23 DIAGNOSIS — Z888 Allergy status to other drugs, medicaments and biological substances status: Secondary | ICD-10-CM | POA: Insufficient documentation

## 2018-02-23 DIAGNOSIS — I493 Ventricular premature depolarization: Secondary | ICD-10-CM | POA: Diagnosis not present

## 2018-02-23 HISTORY — PX: A-FLUTTER ABLATION: EP1230

## 2018-02-23 LAB — GLUCOSE, CAPILLARY
Glucose-Capillary: 127 mg/dL — ABNORMAL HIGH (ref 70–99)
Glucose-Capillary: 128 mg/dL — ABNORMAL HIGH (ref 70–99)

## 2018-02-23 SURGERY — A-FLUTTER ABLATION
Anesthesia: General

## 2018-02-23 MED ORDER — FENTANYL CITRATE (PF) 100 MCG/2ML IJ SOLN: 25.0000 ug | INTRAMUSCULAR | Status: DC | PRN

## 2018-02-23 MED ORDER — HEPARIN (PORCINE) IN NACL 1000-0.9 UT/500ML-% IV SOLN
INTRAVENOUS | Status: AC
  Filled 2018-02-23: qty 500

## 2018-02-23 MED ORDER — ONDANSETRON HCL 4 MG/2ML IJ SOLN: 4.0000 mg | Freq: Four times a day (QID) | INTRAMUSCULAR | Status: DC | PRN

## 2018-02-23 MED ORDER — MIDAZOLAM HCL 5 MG/5ML IJ SOLN
INTRAMUSCULAR | Status: DC | PRN
  Administered 2018-02-23: 1 mg via INTRAVENOUS

## 2018-02-23 MED ORDER — ACETAMINOPHEN 325 MG PO TABS: 650.0000 mg | ORAL_TABLET | ORAL | Status: DC | PRN

## 2018-02-23 MED ORDER — SODIUM CHLORIDE 0.9 % IV SOLN
INTRAVENOUS | Status: DC
  Administered 2018-02-23: 08:00:00 via INTRAVENOUS

## 2018-02-23 MED ORDER — RIVAROXABAN 20 MG PO TABS: 20.0000 mg | ORAL_TABLET | Freq: Every day | ORAL | Status: DC

## 2018-02-23 MED ORDER — BUPIVACAINE HCL (PF) 0.25 % IJ SOLN
INTRAMUSCULAR | Status: DC | PRN
  Administered 2018-02-23: 20 mL

## 2018-02-23 MED ORDER — BUPIVACAINE HCL (PF) 0.25 % IJ SOLN
INTRAMUSCULAR | Status: AC
  Filled 2018-02-23: qty 30

## 2018-02-23 MED ORDER — PROPOFOL 10 MG/ML IV BOLUS
INTRAVENOUS | Status: DC | PRN
  Administered 2018-02-23: 50 mg via INTRAVENOUS
  Administered 2018-02-23: 100 mg via INTRAVENOUS
  Administered 2018-02-23: 50 mg via INTRAVENOUS

## 2018-02-23 MED ORDER — HEPARIN (PORCINE) IN NACL 1000-0.9 UT/500ML-% IV SOLN
INTRAVENOUS | Status: DC | PRN
  Administered 2018-02-23 (×2): 500 mL

## 2018-02-23 MED ORDER — EPHEDRINE SULFATE-NACL 50-0.9 MG/10ML-% IV SOSY
PREFILLED_SYRINGE | INTRAVENOUS | Status: DC | PRN
  Administered 2018-02-23: 5 mg via INTRAVENOUS
  Administered 2018-02-23: 10 mg via INTRAVENOUS

## 2018-02-23 MED ORDER — ONDANSETRON HCL 4 MG/2ML IJ SOLN
INTRAMUSCULAR | Status: DC | PRN
  Administered 2018-02-23: 4 mg via INTRAVENOUS

## 2018-02-23 MED ORDER — ROCURONIUM BROMIDE 50 MG/5ML IV SOSY
PREFILLED_SYRINGE | INTRAVENOUS | Status: DC | PRN
  Administered 2018-02-23: 20 mg via INTRAVENOUS
  Administered 2018-02-23: 50 mg via INTRAVENOUS

## 2018-02-23 MED ORDER — SUGAMMADEX SODIUM 200 MG/2ML IV SOLN
INTRAVENOUS | Status: DC | PRN
  Administered 2018-02-23: 250 mg via INTRAVENOUS

## 2018-02-23 MED ORDER — PHENYLEPHRINE 40 MCG/ML (10ML) SYRINGE FOR IV PUSH (FOR BLOOD PRESSURE SUPPORT)
PREFILLED_SYRINGE | INTRAVENOUS | Status: DC | PRN
  Administered 2018-02-23: 80 ug via INTRAVENOUS
  Administered 2018-02-23: 40 ug via INTRAVENOUS
  Administered 2018-02-23: 80 ug via INTRAVENOUS
  Administered 2018-02-23: 120 ug via INTRAVENOUS
  Administered 2018-02-23: 80 ug via INTRAVENOUS

## 2018-02-23 MED ORDER — DEXAMETHASONE SODIUM PHOSPHATE 10 MG/ML IJ SOLN
INTRAMUSCULAR | Status: DC | PRN
  Administered 2018-02-23: 10 mg via INTRAVENOUS

## 2018-02-23 MED ORDER — SODIUM CHLORIDE 0.9% FLUSH: 3.0000 mL | INTRAVENOUS | Status: DC | PRN

## 2018-02-23 MED ORDER — SODIUM CHLORIDE 0.9 % IV SOLN: 250.0000 mL | INTRAVENOUS | Status: DC | PRN

## 2018-02-23 MED ORDER — ONDANSETRON HCL 4 MG/2ML IJ SOLN: 4.0000 mg | Freq: Once | INTRAMUSCULAR | Status: DC | PRN

## 2018-02-23 MED ORDER — LIDOCAINE 2% (20 MG/ML) 5 ML SYRINGE
INTRAMUSCULAR | Status: DC | PRN
  Administered 2018-02-23: 100 mg via INTRAVENOUS

## 2018-02-23 MED ORDER — SODIUM CHLORIDE 0.9 % IV SOLN
INTRAVENOUS | Status: DC | PRN
  Administered 2018-02-23: 25 ug/min via INTRAVENOUS

## 2018-02-23 MED ORDER — SODIUM CHLORIDE 0.9% FLUSH: 3.0000 mL | Freq: Two times a day (BID) | INTRAVENOUS | Status: DC

## 2018-02-23 MED ORDER — FENTANYL CITRATE (PF) 100 MCG/2ML IJ SOLN
INTRAMUSCULAR | Status: DC | PRN
  Administered 2018-02-23: 100 ug via INTRAVENOUS

## 2018-02-23 SURGICAL SUPPLY — 13 items
BLANKET WARM UNDERBOD FULL ACC (MISCELLANEOUS) ×3 IMPLANT
CATH EZ STEER NAV 8MM D-F CUR (ABLATOR) ×3 IMPLANT
CATH JOSEPH QUAD ALLRED 6F REP (CATHETERS) ×6 IMPLANT
CATH WEBSTER BI DIR CS D-F CRV (CATHETERS) ×3 IMPLANT
PACK EP LATEX FREE (CUSTOM PROCEDURE TRAY) ×2
PACK EP LF (CUSTOM PROCEDURE TRAY) ×1 IMPLANT
PAD PRO RADIOLUCENT 2001M-C (PAD) ×3 IMPLANT
PATCH CARTO3 (PAD) ×3 IMPLANT
SHEATH PINNACLE 6F 10CM (SHEATH) ×3 IMPLANT
SHEATH PINNACLE 7F 10CM (SHEATH) ×3 IMPLANT
SHEATH PINNACLE 8F 10CM (SHEATH) ×3 IMPLANT
SHEATH PINNACLE 9F 10CM (SHEATH) ×3 IMPLANT
SHEATH SWARTZ RAMP 8.5F 60CM (SHEATH) ×3 IMPLANT

## 2018-02-23 NOTE — Anesthesia Preprocedure Evaluation (Addendum)
Anesthesia Evaluation    Reviewed: Allergy & Precautions, Patient's Chart, lab work & pertinent test results, reviewed documented beta blocker date and time   Airway Mallampati: II  TM Distance: >3 FB Neck ROM: Full    Dental  (+) Teeth Intact, Dental Advisory Given, Chipped,    Pulmonary asthma ,    Pulmonary exam normal breath sounds clear to auscultation       Cardiovascular hypertension, Pt. on home beta blockers and Pt. on medications + dysrhythmias Atrial Fibrillation  Rhythm:Irregular Rate:Abnormal  Echo 12/27/13: Study Conclusions  - Left ventricle: The cavity size was normal. There was mildconcentric hypertrophy. Systolic function was normal. Theestimated ejection fraction was in the range of 55% to 60%.Although no diagnostic regional wall motion abnormality wasidentified, this possibility cannot be completely excluded on thebasis of this study. Left ventricular diastolic functionparameters were normal. - Left atrium: The atrium was severely dilated. - Pulmonary arteries: Systolic pressure was mildly increased. PApeak pressure: 38 mm Hg (S).   Neuro/Psych  Neuromuscular disease negative psych ROS   GI/Hepatic Neg liver ROS, GERD  Medicated,  Endo/Other  diabetes, Type 2, Insulin Dependent, Oral Hypoglycemic AgentsMorbid obesity  Renal/GU Renal disease     Musculoskeletal  (+) Arthritis ,   Abdominal   Peds  Hematology  (+) Blood dyscrasia (Xarelto), ,   Anesthesia Other Findings Day of surgery medications reviewed with the patient.  Reproductive/Obstetrics                           Anesthesia Physical Anesthesia Plan  ASA: III  Anesthesia Plan: General   Post-op Pain Management:    Induction: Intravenous  PONV Risk Score and Plan: 2 and Ondansetron and Dexamethasone  Airway Management Planned: Oral ETT  Additional Equipment:   Intra-op Plan:   Post-operative Plan:  Extubation in OR  Informed Consent:   Plan Discussed with:   Anesthesia Plan Comments:         Anesthesia Quick Evaluation

## 2018-02-23 NOTE — Transfer of Care (Signed)
Immediate Anesthesia Transfer of Care Note  Patient: Mark Singh  Procedure(s) Performed: A-FLUTTER ABLATION (N/A )  Patient Location: Cath Lab  Anesthesia Type:General  Level of Consciousness: awake, alert  and oriented  Airway & Oxygen Therapy: Patient Spontanous Breathing and Patient connected to nasal cannula oxygen  Post-op Assessment: Report given to RN and Post -op Vital signs reviewed and stable  Post vital signs: Reviewed and stable  Last Vitals:  Vitals Value Taken Time  BP 133/74 02/23/2018 12:03 PM  Temp    Pulse 69 02/23/2018 12:03 PM  Resp 22 02/23/2018 12:03 PM  SpO2 93 % 02/23/2018 12:03 PM  Vitals shown include unvalidated device data.  Last Pain:  Vitals:   02/23/18 0703  TempSrc: Oral      Patients Stated Pain Goal: 5 (01/00/71 2197)  Complications: No apparent anesthesia complications

## 2018-02-23 NOTE — Discharge Instructions (Signed)
Moderate Conscious Sedation, Adult, Care After °These instructions provide you with information about caring for yourself after your procedure. Your health care provider may also give you more specific instructions. Your treatment has been planned according to current medical practices, but problems sometimes occur. Call your health care provider if you have any problems or questions after your procedure. °What can I expect after the procedure? °After your procedure, it is common: °· To feel sleepy for several hours. °· To feel clumsy and have poor balance for several hours. °· To have poor judgment for several hours. °· To vomit if you eat too soon. ° °Follow these instructions at home: °For at least 24 hours after the procedure: ° °· Do not: °? Participate in activities where you could fall or become injured. °? Drive. °? Use heavy machinery. °? Drink alcohol. °? Take sleeping pills or medicines that cause drowsiness. °? Make important decisions or sign legal documents. °? Take care of children on your own. °· Rest. °Eating and drinking °· Follow the diet recommended by your health care provider. °· If you vomit: °? Drink water, juice, or soup when you can drink without vomiting. °? Make sure you have little or no nausea before eating solid foods. °General instructions °· Have a responsible adult stay with you until you are awake and alert. °· Take over-the-counter and prescription medicines only as told by your health care provider. °· If you smoke, do not smoke without supervision. °· Keep all follow-up visits as told by your health care provider. This is important. °Contact a health care provider if: °· You keep feeling nauseous or you keep vomiting. °· You feel light-headed. °· You develop a rash. °· You have a fever. °Get help right away if: °· You have trouble breathing. °This information is not intended to replace advice given to you by your health care provider. Make sure you discuss any questions you have  with your health care provider. °Document Released: 02/06/2013 Document Revised: 09/21/2015 Document Reviewed: 08/08/2015 °Elsevier Interactive Patient Education © 2018 [employer] ° ° ° ° °Femoral Site Care °Refer to this sheet in the next few weeks. These instructions provide you with information about caring for yourself after your procedure. Your health care provider may also give you more specific instructions. Your treatment has been planned according to current medical practices, but problems sometimes occur. Call your health care provider if you have any problems or questions after your procedure. °What can I expect after the procedure? °After your procedure, it is typical to have the following: °· Bruising at the site that usually fades within 1-2 weeks. °· Blood collecting in the tissue (hematoma) that may be painful to the touch. It should usually decrease in size and tenderness within 1-2 weeks. ° °Follow these instructions at home: °· Take medicines only as directed by your health care provider. °· You may shower 24-48 hours after the procedure or as directed by your health care provider. Remove the bandage (dressing) and gently wash the site with plain soap and water. Pat the area dry with a clean towel. Do not rub the site, because this may cause bleeding. °· Do not take baths, swim, or use a hot tub until your health care provider approves. °· Check your insertion site every day for redness, swelling, or drainage. °· Do not apply powder or lotion to the site. °· Limit use of stairs to twice a day for the first 2-3 days or as directed by your health   care provider. °· Do not squat for the first 2-3 days or as directed by your health care provider. °· Do not lift over 10 lb (4.5 kg) for 5 days after your procedure or as directed by your health care provider. °· Ask your health care provider when it is okay to: °? Return to work or school. °? Resume usual physical activities or sports. °? Resume sexual  activity. °· Do not drive home if you are discharged the same day as the procedure. Have someone else drive you. °· You may drive 24 hours after the procedure unless otherwise instructed by your health care provider. °· Do not operate machinery or power tools for 24 hours after the procedure or as directed by your health care provider. °· If your procedure was done as an outpatient procedure, which means that you went home the same day as your procedure, a responsible adult should be with you for the first 24 hours after you arrive home. °· Keep all follow-up visits as directed by your health care provider. This is important. °Contact a health care provider if: °· You have a fever. °· You have chills. °· You have increased bleeding from the site. Hold pressure on the site. °Get help right away if: °· You have unusual pain at the site. °· You have redness, warmth, or swelling at the site. °· You have drainage (other than a small amount of blood on the dressing) from the site. °· The site is bleeding, and the bleeding does not stop after 30 minutes of holding steady pressure on the site. °· Your leg or foot becomes pale, cool, tingly, or numb. °This information is not intended to replace advice given to you by your health care provider. Make sure you discuss any questions you have with your health care provider. °Document Released: 12/20/2013 Document Revised: 09/24/2015 Document Reviewed: 11/05/2013 °Elsevier Interactive Patient Education © 2018 [employer] ° ° ° °Post procedure care instructions °No driving for 4 days. No lifting over 5 lbs for 1 week. No vigorous or sexual activity for 1 week. You may return to work on 03/02/18. Keep procedure site clean & dry. If you notice increased pain, swelling, bleeding or pus, call/return!  You may shower, but no soaking baths/hot tubs/pools for 1 week.  ° ° °

## 2018-02-23 NOTE — H&P (Signed)
Date:  02/23/2018   ID:  Mark Singh, DOB Sep 29, 1945, MRN [id number]  PCP:  Kathyrn Lass, MD  Cardiologist:  Marlou Porch Primary Electrophysiologist:  [name], MD    No chief complaint on file.    History of Present Illness: Mark Singh is a 72 y.o. male who presents today for electrophysiology evaluation.   history of PVC's, bradycardia, nephrotic syndrome (complete responder to steroid therapy by Dr. Lorrene Reid), DM, HTN, HLD, obesity, orthostatic hypotension, and mild pulm HTN felt 2/2 obesity Comes in today now s/p ablation procedure for his PVC's.  PVCs were mapped to inferior to the [facility] and successfully ablated on 03/30/15.  He subsequently went into atrial fibrillation and had ablation 12/13/2017.  He subsequently presented back to atrial fibrillation clinic in atrial flutter.  Today, denies symptoms of palpitations, chest pain, shortness of breath, orthopnea, PND, lower extremity edema, claudication, dizziness, presyncope, syncope, bleeding, or neurologic sequela. The patient is tolerating medications without difficulties. Plan for atrial flutter ablation    Past Medical History:  Diagnosis Date  . Arthritis    "hands, feet" (12/13/2017)  . Asthma   . Bradycardia    a. Repoted h/o HR in the 40s.  . Edema    a. 2D echo 11/2013: EF 46-27%, LV diastolic function parameters were normal, severely dilated LA, PASP 49mmHg..  . H/O: gout    right foot; on daily RX" (12/13/2017)  . HTN (hypertension)   . Hyperlipidemia   . Myalgia and myositis, unspecified   . Nephrotic syndrome    a. responder to steroid therapy by Dr. Lorrene Reid.   . Obesity   . Orthostatic hypotension   . Penile cancer ([place])    "did circ"  . Pulmonary hypertension ([place])    a. Mild pulm HTN felt 2/2 obesity.  [name] PVC's (premature ventricular contractions)    a. Holter 05/2013 showing 18k PVCs (18% of the time).  . Seasonal asthma    mild  . Shoulder pain   . Squamous carcinoma    "right hand; left shoulder" (12/13/2017)  . Type 2 diabetes mellitus ([place])    Past Surgical History:  Procedure Laterality Date  . ATRIAL FIBRILLATION ABLATION N/A 12/13/2017   Procedure: ATRIAL FIBRILLATION ABLATION;  Surgeon: Constance Haw, MD;  Location: [place] [facility];  Service: Cardiovascular;  Laterality: N/A;  . BUNIONECTOMY WITH HAMMERTOE RECONSTRUCTION Left 1992  &  2008   REMOVAL HEAL SPUR/ BUNIONECTOMY AND HAMMERTOE REPAIR   . CARDIOVERSION N/A 07/22/2015   Procedure: CARDIOVERSION;  Surgeon: Dorothy Spark, MD;  Location: [place];  Service: Cardiovascular;  Laterality: N/A;  . CARDIOVERSION N/A 10/06/2015   Procedure: CARDIOVERSION;  Surgeon: Sueanne Margarita, MD;  Location: [facility] ENDOSCOPY;  Service: Cardiovascular;  Laterality: N/A;  . CARDIOVERSION N/A 12/05/2016   Procedure: CARDIOVERSION;  Surgeon: Pixie Casino, MD;  Location: [facility] ENDOSCOPY;  Service: Cardiovascular;  Laterality: N/A;  . CIRCUMCISION N/A 11/27/2012   Procedure:  CIRCUMCISION  AND EXCISION OF GLANS PENIS;  Surgeon: Fredricka Bonine, MD;  Location: [facility];  Service: Urology;  Laterality: N/A;  . ELECTROPHYSIOLOGIC STUDY N/A 03/30/2015   Procedure: PVC Ablation;  Surgeon: [name], MD;  Location: [place] [facility];  Service: Cardiovascular;  Laterality: N/A;  . HERNIA REPAIR    . SQUAMOUS CELL CARCINOMA EXCISION     "right hand; left shoulder" (12/13/2017)  . UMBILICAL HERNIA REPAIR  11-04-2005     Current Facility-Administered Medications  Medication Dose Route Frequency Provider Last Rate Last Dose  . 0.9 %  sodium chloride infusion   Intravenous Continuous Constance Haw, MD 50 mL/hr at 02/23/18 0734      Allergies:   Indomethacin   Social History:  The patient  reports that he has never smoked. He has never used smokeless tobacco. He reports that he does not drink alcohol or use drugs.   Family History:  The patient's family history includes CVA  in his father; Coronary artery disease in his mother.   ROS:  Please see the history of present illness.   Otherwise, review of systems is positive for none.   All other systems are reviewed and negative.   PHYSICAL EXAM: VS:  BP (!) 149/83   Pulse 66   Temp 97.9 F (36.6 C) (Oral)   Resp 18   Ht 5\' 9"  (1.753 m)   Wt 122.5 kg   SpO2 99%   BMI 39.87 kg/m  , BMI Body mass index is 39.87 kg/m. GEN: Well nourished, well developed, in no acute distress  HEENT: normal  Neck: no JVD, carotid bruits, or masses Cardiac: RRR; no murmurs, rubs, or gallops,no edema  Respiratory:  clear to auscultation bilaterally, normal work of breathing GI: soft, nontender, nondistended, + BS MS: no deformity or atrophy  Skin: warm and dry Neuro:  Strength and sensation are intact Psych: euthymic mood, full affect  Recent Labs: 02/09/2018: BUN 14; Creatinine, Ser 1.24; Hemoglobin 15.0; Platelets 312; Potassium 5.0; Sodium 141    Lipid Panel  No results found for: CHOL, TRIG, HDL, CHOLHDL, VLDL, LDLCALC, LDLDIRECT   Wt Readings from Last 3 Encounters:  02/23/18 122.5 kg  01/29/18 120.7 kg  01/10/18 121.1 kg      Other studies Reviewed: Additional studies/ records that were reviewed today include: TTE 12/28/14  Review of the above records today demonstrates:  - Left ventricle: The cavity size was normal. There was mild concentric hypertrophy. Systolic function was normal. The estimated ejection fraction was in the range of 55% to 60%. Although no diagnostic regional wall motion abnormality was identified, this possibility cannot be completely excluded on the basis of this study. Left ventricular diastolic function parameters were normal. - Left atrium: The atrium was severely dilated. - Pulmonary arteries: Systolic pressure was mildly increased. PA peak pressure: 38 mm Hg (S).   ASSESSMENT AND PLAN:  1.  PVCs: Ablation 03/2015.  No further PVCs.  2.  Persistent atrial  fibrillation: Currently on Xarelto.  Had atrial fibrillation ablation 12/13/2017.  Unfortunately return to clinic in atrial flutter.  This patients CHA2DS2-VASc Score and unadjusted Ischemic Stroke Rate (% per year) is equal to 3.2 % stroke rate/year from a score of 3  Above score calculated as 1 point each if present [CHF, HTN, DM, Vascular=MI/PAD/Aortic Plaque, Age if 65-74, or Male] Above score calculated as 2 points each if present [Age > 75, or Stroke/TIA/TE]  3.  Typical atrial flutter:   Mark Singh has presented today for surgery, with the diagnosis of atrial flutter.  The various methods of treatment have been discussed with the patient and family. After consideration of risks, benefits and other options for treatment, the patient has consented to  Procedure(s): Catheter ablation as a surgical intervention .  Risks include but not limited to bleeding, tamponade, heart block, stroke, damage to surrounding organs, among others. The patient's history has been reviewed, patient examined, no change in status, stable for surgery.  I have reviewed the  patient's chart and labs.  Questions were answered to the patient's satisfaction.       Signed, [name], MD  02/23/2018 7:59 AM

## 2018-02-23 NOTE — Progress Notes (Signed)
Patient seen and examined. Feeling well post ablation. No issues with access sites. Plan for discharge. Have discussed discharge instructions with the patient.   Nakshatra Klose Curt Bears, MD 02/23/2018 2:41 PM

## 2018-02-23 NOTE — Progress Notes (Signed)
Site area: right groin a 6,7, and 9 french venous sheath was removed Site Prior to Removal:  Level 0  Pressure Applied For 20 MINUTES    Bedresty Beginning at 1235pm  Manual:   Yes.    Patient Status During Pull:  stable  Post Pull Groin Site:  Level 0  Post Pull Instructions Given:  Yes.    Post Pull Pulses Present:  Yes.    Dressing Applied:  Yes.    Comments:  VS remain stable

## 2018-02-23 NOTE — Anesthesia Procedure Notes (Signed)
Procedure Name: Intubation Date/Time: 02/23/2018 10:13 AM Performed by: Candis Shine, CRNA Pre-anesthesia Checklist: Patient identified, Emergency Drugs available, Suction available and Patient being monitored Patient Re-evaluated:Patient Re-evaluated prior to induction Oxygen Delivery Method: Circle System Utilized Preoxygenation: Pre-oxygenation with 100% oxygen Induction Type: IV induction Ventilation: Mask ventilation without difficulty Laryngoscope Size: Mac and 4 Grade View: Grade I Tube type: Oral Tube size: 7.5 mm Number of attempts: 1 Airway Equipment and Method: Stylet Placement Confirmation: ETT inserted through vocal cords under direct vision,  positive ETCO2 and breath sounds checked- equal and bilateral Secured at: 23 cm Tube secured with: Tape Dental Injury: Teeth and Oropharynx as per pre-operative assessment

## 2018-02-26 ENCOUNTER — Encounter (HOSPITAL_COMMUNITY): Payer: Self-pay | Admitting: Cardiology

## 2018-02-26 MED FILL — Heparin Sod (Porcine)-NaCl IV Soln 1000 Unit/500ML-0.9%: INTRAVENOUS | Qty: 1000 | Status: AC

## 2018-03-02 NOTE — Anesthesia Postprocedure Evaluation (Signed)
Anesthesia Post Note  Patient: Mark Singh  Procedure(s) Performed: A-FLUTTER ABLATION (N/A )     Patient location during evaluation: PACU Anesthesia Type: General Level of consciousness: awake and alert Pain management: pain level controlled Vital Signs Assessment: post-procedure vital signs reviewed and stable Respiratory status: spontaneous breathing, nonlabored ventilation, respiratory function stable and patient connected to nasal cannula oxygen Cardiovascular status: blood pressure returned to baseline and stable Postop Assessment: no apparent nausea or vomiting Anesthetic complications: no    Last Vitals:  Vitals:   02/23/18 1631 02/23/18 1632  BP:  136/81  Pulse: 70   Resp:    Temp:    SpO2: 95%     Last Pain:  Vitals:   02/26/18 1403  TempSrc:   PainSc: 0-No pain                 Catalina Gravel

## 2018-03-19 ENCOUNTER — Ambulatory Visit: Payer: Medicare Other | Admitting: Cardiology

## 2018-03-27 ENCOUNTER — Ambulatory Visit: Payer: Medicare Other | Admitting: Cardiology

## 2018-03-27 ENCOUNTER — Encounter: Payer: Self-pay | Admitting: Cardiology

## 2018-03-27 VITALS — BP 124/80 | HR 62 | Ht 69.0 in | Wt 276.0 lb

## 2018-03-27 DIAGNOSIS — I493 Ventricular premature depolarization: Secondary | ICD-10-CM | POA: Diagnosis not present

## 2018-03-27 DIAGNOSIS — I4819 Other persistent atrial fibrillation: Secondary | ICD-10-CM

## 2018-03-27 DIAGNOSIS — I483 Typical atrial flutter: Secondary | ICD-10-CM | POA: Diagnosis not present

## 2018-03-27 NOTE — Progress Notes (Signed)
Electrophysiology Office Note   Date:  03/27/2018   ID:  Mark Singh, DOB 09-23-1945, MRN [id number]  PCP:  Kathyrn Lass, MD  Cardiologist:  Marlou Porch Primary Electrophysiologist:  Dionne Knoop Meredith Leeds, MD    No chief complaint on file.    History of Present Illness: Mark Singh is a 72 y.o. male who presents today for electrophysiology evaluation.   history of PVC's, bradycardia, nephrotic syndrome (complete responder to steroid therapy by Dr. Lorrene Reid), DM, HTN, HLD, obesity, orthostatic hypotension, and mild pulm HTN felt 2/2 obesity Comes in today now s/p ablation procedure for his PVC's.  PVCs were mapped to inferior to the [facility] and successfully ablated on 03/30/15.  He subsequently went into atrial fibrillation and had ablation 12/13/2017.  He subsequently presented back to atrial fibrillation clinic in atrial flutter.  He is status post atrial flutter ablation on 02/26/2018.  Today, denies symptoms of palpitations, chest pain, shortness of breath, orthopnea, PND, lower extremity edema, claudication, dizziness, presyncope, syncope, bleeding, or neurologic sequela. The patient is tolerating medications without difficulties.  Overall he is feeling well.  No chest pain or shortness of breath.  He has not had palpitations since his atrial flutter ablation.  He has more energy and is quite a bit less short of breath.  Past Medical History:  Diagnosis Date  . Arthritis    "hands, feet" (12/13/2017)  . Asthma   . Bradycardia    a. Repoted h/o HR in the 40s.  . Edema    a. 2D echo 11/2013: EF 85-27%, LV diastolic function parameters were normal, severely dilated LA, PASP 39mmHg..  . H/O: gout    right foot; on daily RX" (12/13/2017)  . HTN (hypertension)   . Hyperlipidemia   . Myalgia and myositis, unspecified   . Nephrotic syndrome    a. responder to steroid therapy by Dr. Lorrene Reid.   . Obesity   . Orthostatic hypotension   . Penile cancer ([place])    "did circ"  .  Pulmonary hypertension ([place])    a. Mild pulm HTN felt 2/2 obesity.  [name] PVC's (premature ventricular contractions)    a. Holter 05/2013 showing 18k PVCs (18% of the time).  . Seasonal asthma    mild  . Shoulder pain   . Squamous carcinoma    "right hand; left shoulder" (12/13/2017)  . Type 2 diabetes mellitus ([place])    Past Surgical History:  Procedure Laterality Date  . A-FLUTTER ABLATION N/A 02/23/2018   Procedure: A-FLUTTER ABLATION;  Surgeon: Constance Haw, MD;  Location: [place] [facility];  Service: Cardiovascular;  Laterality: N/A;  . ATRIAL FIBRILLATION ABLATION N/A 12/13/2017   Procedure: ATRIAL FIBRILLATION ABLATION;  Surgeon: Constance Haw, MD;  Location: [place] [facility];  Service: Cardiovascular;  Laterality: N/A;  . BUNIONECTOMY WITH HAMMERTOE RECONSTRUCTION Left 1992  &  2008   REMOVAL HEAL SPUR/ BUNIONECTOMY AND HAMMERTOE REPAIR   . CARDIOVERSION N/A 07/22/2015   Procedure: CARDIOVERSION;  Surgeon: Dorothy Spark, MD;  Location: [place];  Service: Cardiovascular;  Laterality: N/A;  . CARDIOVERSION N/A 10/06/2015   Procedure: CARDIOVERSION;  Surgeon: Sueanne Margarita, MD;  [facility];  Service: Cardiovascular;  Laterality: N/A;  . CARDIOVERSION N/A 12/05/2016   Procedure: CARDIOVERSION;  Surgeon: Pixie Casino, MD;  Location: [facility] END[facility]COPY;  Service: Cardiovascular;  Laterality: N/A;  . CIRCUMCISION N/A 11/27/2012   Procedure:  CIRCUMCISION  AND EXCISION OF GLANS PENIS;  Surgeon: Fredricka Bonine, MD;  Location:  [place];  Service: Urology;  Laterality: N/A;  . ELECTROPHYSIOLOGIC STUDY N/A 03/30/2015   Procedure: PVC Ablation;  Surgeon: Marcelle Bebout Meredith Leeds, MD;  Location: [place] [facility];  Service: Cardiovascular;  Laterality: N/A;  . HERNIA REPAIR    . SQUAMOUS CELL CARCINOMA EXCISION     "right hand; left shoulder" (12/13/2017)  . UMBILICAL HERNIA REPAIR  11-04-2005     Current Outpatient Medications  Medication  Sig Dispense Refill  . Cholecalciferol (VITAMIN D3) 5000 units CAPS Take 5,000 Units by mouth daily.     [name] desonide (DESOWEN) 0.05 % cream Apply 1 application topically 2 (two) times daily as needed (psoriasis).    . Fluticasone-Salmeterol (WIXELA INHUB) 100-50 MCG/DOSE AEPB Inhale 1 puff into the lungs 2 (two) times daily as needed (for respiratory issues.).     [name] LANTUS SOLOSTAR 100 UNIT/ML Solostar Pen Inject 28 Units into the skin at bedtime.   0  . losartan (COZAAR) 50 MG tablet Take 50 mg by mouth daily.  3  . metFORMIN (GLUCOPHAGE) 1000 MG tablet Take 1,000 mg by mouth 2 (two) times daily.  0  . metoprolol succinate (TOPROL-XL) 100 MG 24 hr tablet Take 1 tablet (100 mg total) by mouth daily. Take with or immediately following a meal. 90 tablet 3  . omeprazole (PRILOSEC) 20 MG capsule Take 20 mg by mouth daily.  0  . pimecrolimus (ELIDEL) 1 % cream Apply 1 application topically 2 (two) times daily as needed (psoriasis).     . probenecid (BENEMID) 500 MG tablet Take 500 mg by mouth daily.     . simvastatin (ZOCOR) 40 MG tablet Take 40 mg by mouth every evening.     . tamsulosin (FLOMAX) 0.4 MG CAPS capsule Take 0.4 mg by mouth every evening.   4  . vitamin B-12 (CYANOCOBALAMIN) 1000 MCG tablet Take 1,000 mcg by mouth daily.    Alveda Reasons 20 MG TABS tablet TAKE 1 TABLET (20 MG TOTAL) BY MOUTH DAILY WITH SUPPER. 30 tablet 11   No current facility-administered medications for this visit.     Allergies:   Indomethacin   Social History:  The patient  reports that he has never smoked. He has never used smokeless tobacco. He reports that he does not drink alcohol or use drugs.   Family History:  The patient's family history includes CVA in his father; Coronary artery disease in his mother.   ROS:  Please see the history of present illness.   Otherwise, review of systems is positive for cough.   All other systems are reviewed and negative.   PHYSICAL EXAM: VS:  BP 124/80   Pulse 62   Ht 5'  9" (1.753 m)   Wt 276 lb (125.2 kg)   BMI 40.76 kg/m  , BMI Body mass index is 40.76 kg/m. GEN: Well nourished, well developed, in no acute distress  HEENT: normal  Neck: no JVD, carotid bruits, or masses Cardiac: RRR; no murmurs, rubs, or gallops,no edema  Respiratory:  clear to auscultation bilaterally, normal work of breathing GI: soft, nontender, nondistended, + BS MS: no deformity or atrophy  Skin: warm and dry Neuro:  Strength and sensation are intact Psych: euthymic mood, full affect  EKG:  EKG is ordered today. Personal review of the ekg ordered shows sinus rhythm, rate 62  Recent Labs: 02/09/2018: BUN 14; Creatinine, Ser 1.24; Hemoglobin 15.0; Platelets 312; Potassium 5.0; Sodium 141    Lipid Panel  No results found for: CHOL,  TRIG, HDL, CHOLHDL, VLDL, LDLCALC, LDLDIRECT   Wt Readings from Last 3 Encounters:  03/27/18 276 lb (125.2 kg)  02/23/18 270 lb (122.5 kg)  01/29/18 266 lb (120.7 kg)      Other studies Reviewed: Additional studies/ records that were reviewed today include: TTE 12/28/14  Review of the above records today demonstrates:  - Left ventricle: The cavity size was normal. There was mild concentric hypertrophy. Systolic function was normal. The estimated ejection fraction was in the range of 55% to 60%. Although no diagnostic regional wall motion abnormality was identified, this possibility cannot be completely excluded on the basis of this study. Left ventricular diastolic function parameters were normal. - Left atrium: The atrium was severely dilated. - Pulmonary arteries: Systolic pressure was mildly increased. PA peak pressure: 38 mm Hg (S).   ASSESSMENT AND PLAN:  1.  PVCs: That is post ablation 03/2015.  No further PVCs.    2.  Persistent atrial fibrillation: Currently on Xarelto.  Status post AF ablation 12/13/2017.  No further episodes.  No changes.    This patients CHA2DS2-VASc Score and unadjusted Ischemic Stroke Rate  (% per year) is equal to 3.2 % stroke rate/year from a score of 3  Above score calculated as 1 point each if present [CHF, HTN, DM, Vascular=MI/PAD/Aortic Plaque, Age if 65-74, or Male] Above score calculated as 2 points each if present [Age > 75, or Stroke/TIA/TE]   3.  Typical atrial flutter: Found on AF clinic check after ablation.  Status post ablation of atrial flutter 02/26/2018.  Remains in sinus rhythm.  No changes.  Current medicines are reviewed at length with the patient today.   The patient does not have concerns regarding his medicines.  The following changes were made today: None  Labs/ tests ordered today include:  Orders Placed This Encounter  Procedures  . EKG 12-Lead     Disposition:   FU with Nael Petrosyan 3 months  Signed, Eddye Broxterman Meredith Leeds, MD  03/27/2018 2:52 PM     [facility] [address] [place] [address] [phone] (office) [phone] (fax)

## 2018-03-27 NOTE — Patient Instructions (Signed)
Medication Instructions:    Your physician recommends that you continue on your current medications as directed. Please refer to the Current Medication list given to you today.  - If you need a refill on your cardiac medications before your next appointment, please call your pharmacy.   Labwork:  None ordered  Testing/Procedures:  None ordered  Follow-Up:  Your physician recommends that you schedule a follow-up appointment in: 3 months with Dr. Camnitz.  Thank you for choosing [facility]!!   Sherri Price, RN [phone]         

## 2018-04-17 ENCOUNTER — Ambulatory Visit: Payer: Medicare Other | Admitting: Cardiology

## 2018-04-18 ENCOUNTER — Telehealth: Payer: Self-pay | Admitting: Cardiology

## 2018-04-18 NOTE — Telephone Encounter (Signed)
New message       [facility] Pre-operative Risk Assessment    Request for surgical clearance:  1. What type of surgery is being performed? Biopsy   2. When is this surgery scheduled? Jan 21,2020   3. What type of clearance is required (medical clearance vs. Pharmacy clearance to hold med vs. Both)? both  4. Are there any medications that need to be held prior to surgery and how long? 10 days prior   5. Practice name and name of physician performing surgery? Dr Florene Route - [facility]   6. What is your office phone number [phone]     7.   What is your office fax number  Patient did not have   8.   Anesthesia type (None, local, MAC, general) ?  Patient not sure   Howie Ill 04/18/2018, 2:12 PM  _________________________________________________________________   (provider comments below)

## 2018-04-20 NOTE — Telephone Encounter (Signed)
Called pt back re: message about him having a biopsy. He will contact his surgeons office and have them send a cardiac clearance. He understands that we need more information that what he can provide, ex: anesthesia, what type biopsy, how many days to hold what medication. Pt understood and thanked me for the call.

## 2018-04-20 NOTE — Telephone Encounter (Signed)
   Primary Cardiologist: [name], MD   Chart reviewed as part of pre-operative protocol coverage. Pt is on Xarelto for atrial fibrillation.   Preop call back, please find out what type of biopsy is needed and I will route to pharm for recommendations.   Lyda Jester, PA-C 04/20/2018, 10:03 AM

## 2018-04-20 NOTE — Telephone Encounter (Signed)
Will wait additional information to provide recommendation.  Thank you

## 2018-05-09 DIAGNOSIS — I483 Typical atrial flutter: Secondary | ICD-10-CM

## 2018-05-09 DIAGNOSIS — Z6841 Body Mass Index (BMI) 40.0 and over, adult: Secondary | ICD-10-CM | POA: Insufficient documentation

## 2018-06-18 ENCOUNTER — Encounter: Payer: Self-pay | Admitting: *Deleted

## 2018-06-26 ENCOUNTER — Encounter: Payer: Self-pay | Admitting: Cardiology

## 2018-06-26 ENCOUNTER — Ambulatory Visit: Payer: Medicare Other | Admitting: Cardiology

## 2018-06-26 VITALS — BP 122/62 | HR 81 | Ht 69.0 in | Wt 277.0 lb

## 2018-06-26 DIAGNOSIS — Z01812 Encounter for preprocedural laboratory examination: Secondary | ICD-10-CM | POA: Diagnosis not present

## 2018-06-26 DIAGNOSIS — I4819 Other persistent atrial fibrillation: Secondary | ICD-10-CM | POA: Diagnosis not present

## 2018-06-26 LAB — BASIC METABOLIC PANEL
BUN/Creatinine Ratio: 13 (ref 10–24)
BUN: 15 mg/dL (ref 8–27)
CHLORIDE: 99 mmol/L (ref 96–106)
CO2: 24 mmol/L (ref 20–29)
Calcium: 9.9 mg/dL (ref 8.6–10.2)
Creatinine, Ser: 1.19 mg/dL (ref 0.76–1.27)
GFR calc Af Amer: 70 mL/min/{1.73_m2} (ref >59)
GFR calc non Af Amer: 60 mL/min/{1.73_m2} (ref >59)
GLUCOSE: 172 mg/dL — AB (ref 65–99)
POTASSIUM: 5.2 mmol/L (ref 3.5–5.2)
SODIUM: 140 mmol/L (ref 134–144)

## 2018-06-26 LAB — CBC
Hematocrit: 42.9 % (ref 37.5–51.0)
Hemoglobin: 14.4 g/dL (ref 13.0–17.7)
MCH: 30.6 pg (ref 26.6–33.0)
MCHC: 33.6 g/dL (ref 31.5–35.7)
MCV: 91 fL (ref 79–97)
PLATELETS: 350 10*3/uL (ref 150–450)
RBC: 4.71 x10E6/uL (ref 4.14–5.80)
RDW: 13 % (ref 11.6–15.4)
WBC: 8.2 10*3/uL (ref 3.4–10.8)

## 2018-06-26 NOTE — Addendum Note (Signed)
Addended by: Eulis Foster on: 06/26/2018 09:21 AM   Modules accepted: Orders

## 2018-06-26 NOTE — Patient Instructions (Addendum)
Medication Instructions:  Your physician recommends that you continue on your current medications as directed. Please refer to the Current Medication list given to you today.  * If you need a refill on your cardiac medications before your next appointment, please call your pharmacy.   Labwork: Pre procedure labs today: BMET & CBC *We will only notify you of abnormal results, otherwise continue current treatment plan.  Testing/Procedures: Your physician has recommended that you have a Cardioversion (DCCV). Electrical Cardioversion uses a jolt of electricity to your heart either through paddles or wired patches attached to your chest. This is a controlled, usually prescheduled, procedure. Defibrillation is done under light anesthesia in the hospital, and you usually go home the day of the procedure. This is done to get your heart back into a normal rhythm. You are not awake for the procedure. Please see the instructions below.   Follow-Up: Your physician recommends that you schedule a follow-up appointment in: 2 months with Mark Singh in the AFib clinic.  Your physician wants you to follow-up in: 6 months with [name].  You will receive a reminder letter in the mail two months in advance. If you don't receive a letter, please call our office to schedule the follow-up appointment.   Thank you for choosing [facility]!!   [name], RN [phone]  Any Other Special Instructions Will Be Listed Below (If Applicable).   CARDIOVERSION INSTRUCTIONS You are scheduled for a cardioversion on 06/29/2018 with Dr. [name] or associates. Please go to [facility]  at 7:00 a.m..  Enter through the [place] not have any food or drink after midnight the night before this procedure. Take 1/2 your bedtime insulin the night before this procedure. Hold your Metformin the morning of this procedure. You may take your remaining medicines with a sip of water on the day of your  procedure.  You will need someone to drive you home following your procedure.   Call the [place] office at [phone] if you have any questions, problems or concerns.      Electrical Cardioversion Electrical cardioversion is the delivery of a jolt of electricity to change the rhythm of the heart. Sticky patches or metal paddles are placed on the chest to deliver the electricity from a device. This is done to restore a normal rhythm. A rhythm that is too fast or not regular keeps the heart from pumping well. Electrical cardioversion is done in an emergency if:   There is low or no blood pressure as a result of the heart rhythm.    Normal rhythm must be restored as fast as possible to protect the brain and heart from further damage.    It may save a life. Cardioversion may be done for heart rhythms that are not immediately life threatening, such as atrial fibrillation or flutter, in which:   The heart is beating too fast or is not regular.    Medicine to change the rhythm has not worked.    It is safe to wait in order to allow time for preparation.  Symptoms of the abnormal rhythm are bothersome.  The risk of stroke and other serious problems can be reduced.  LET [facility] CARE PROVIDER KNOW ABOUT:   Any allergies you have.  All medicines you are taking, including vitamins, herbs, eye drops, creams, and over-the-counter medicines.  Previous problems you or members of your family have had with the use of anesthetics.  Any blood disorders you have.    Previous surgeries you have had.    Medical conditions you have.   RISKS AND COMPLICATIONS  Generally, this is a safe procedure. However, problems can occur and include:   Breathing problems related to the anesthetic used.  A blood clot that breaks free and travels to other parts of your body. This could cause a stroke or other problems. The risk of this is lowered by use of blood-thinning  medicine (anticoagulant) prior to the procedure.  Cardiac arrest (rare).   BEFORE THE PROCEDURE   You may have tests to detect blood clots in your heart and to evaluate heart function.   You may start taking anticoagulants so your blood does not clot as easily.    Medicines may be given to help stabilize your heart rate and rhythm.   PROCEDURE  You will be given medicine through an IV tube to reduce discomfort and make you sleepy (sedative).    An electrical shock will be delivered.   AFTER THE PROCEDURE Your heart rhythm will be watched to make sure it does not change. You will need someone to drive you home.

## 2018-06-26 NOTE — Progress Notes (Signed)
Electrophysiology Office Note   Date:  06/26/2018   ID:  Mark Singh, DOB 04/01/1946, MRN [id number]  PCP:  Kathyrn Lass, MD  Cardiologist:  Marlou Porch Primary Electrophysiologist:  [name], MD    No chief complaint on file.    History of Present Illness: Mark Singh is a 73 y.o. male who presents today for electrophysiology evaluation.   history of PVC's, bradycardia, nephrotic syndrome (complete responder to steroid therapy by Dr. Lorrene Reid), DM, HTN, HLD, obesity, orthostatic hypotension, and mild pulm HTN felt 2/2 obesity Comes in today now s/p ablation procedure for his PVC's.  PVCs were mapped to inferior to the [facility] and successfully ablated on 03/30/15.  He subsequently went into atrial fibrillation and had ablation 12/13/2017.  He subsequently presented back to atrial fibrillation clinic in atrial flutter.  He is status post atrial flutter ablation on 02/26/2018.  Today, denies symptoms of palpitations, chest pain, shortness of breath, orthopnea, PND, lower extremity edema, claudication, dizziness, presyncope, syncope, bleeding, or neurologic sequela. The patient is tolerating medications without difficulties.  Overall he is feeling well.  Unfortunately he has gone back into atrial fibrillation.  He is unclear as to when this occurred.  He has no chest pain or shortness of breath.  At this point he would like to try to return back to normal rhythm.  Past Medical History:  Diagnosis Date  . Arthritis    "hands, feet" (12/13/2017)  . Asthma   . Bradycardia    a. Repoted h/o HR in the 40s.  . Edema    a. 2D echo 11/2013: EF 60-73%, LV diastolic function parameters were normal, severely dilated LA, PASP 41mmHg..  . H/O: gout    right foot; on daily RX" (12/13/2017)  . HTN (hypertension)   . Hyperlipidemia   . Myalgia and myositis, unspecified   . Nephrotic syndrome    a. responder to steroid therapy by Dr. Lorrene Reid.   . Obesity   . Orthostatic hypotension    . Penile cancer ([place])    "did circ"  . Pulmonary hypertension ([place])    a. Mild pulm HTN felt 2/2 obesity.  [name] PVC's (premature ventricular contractions)    a. Holter 05/2013 showing 18k PVCs (18% of the time).  . Seasonal asthma    mild  . Shoulder pain   . Squamous carcinoma    "right hand; left shoulder" (12/13/2017)  . Type 2 diabetes mellitus ([place])    Past Surgical History:  Procedure Laterality Date  . A-FLUTTER ABLATION N/A 02/23/2018   Procedure: A-FLUTTER ABLATION;  Surgeon: Constance Haw, MD;  Location: [place] [facility];  Service: Cardiovascular;  Laterality: N/A;  . ATRIAL FIBRILLATION ABLATION N/A 12/13/2017   Procedure: ATRIAL FIBRILLATION ABLATION;  Surgeon: Constance Haw, MD;  Location: [place] [facility];  Service: Cardiovascular;  Laterality: N/A;  . BUNIONECTOMY WITH HAMMERTOE RECONSTRUCTION Left 1992  &  2008   REMOVAL HEAL SPUR/ BUNIONECTOMY AND HAMMERTOE REPAIR   . CARDIOVERSION N/A 07/22/2015   Procedure: CARDIOVERSION;  Surgeon: Dorothy Spark, MD;  Location: [place];  Service: Cardiovascular;  Laterality: N/A;  . CARDIOVERSION N/A 10/06/2015   Procedure: CARDIOVERSION;  Surgeon: Sueanne Margarita, MD;  Location: [facility] ENDOSCOPY;  Service: Cardiovascular;  Laterality: N/A;  . CARDIOVERSION N/A 12/05/2016   Procedure: CARDIOVERSION;  Surgeon: Pixie Casino, MD;  Location: [facility] ENDOSCOPY;  Service: Cardiovascular;  Laterality: N/A;  . CIRCUMCISION N/A 11/27/2012   Procedure:  CIRCUMCISION  AND EXCISION OF  GLANS PENIS;  Surgeon: Fredricka Bonine, MD;  Location: [facility];  Service: Urology;  Laterality: N/A;  . ELECTROPHYSIOLOGIC STUDY N/A 03/30/2015   Procedure: PVC Ablation;  Surgeon: [name], MD;  Location: [place] [facility];  Service: Cardiovascular;  Laterality: N/A;  . HERNIA REPAIR    . SQUAMOUS CELL CARCINOMA EXCISION     "right hand; left shoulder" (12/13/2017)  . UMBILICAL HERNIA REPAIR  11-04-2005      Current Outpatient Medications  Medication Sig Dispense Refill  . Cholecalciferol (VITAMIN D3) 5000 units CAPS Take 5,000 Units by mouth daily.     [name] desonide (DESOWEN) 0.05 % cream Apply 1 application topically 2 (two) times daily as needed (psoriasis).    . Fluticasone-Salmeterol (WIXELA INHUB) 100-50 MCG/DOSE AEPB Inhale 1 puff into the lungs 2 (two) times daily as needed (for respiratory issues.).     [name] imiquimod (ALDARA) 5 % cream APPLY 1 PACKET TOPICALLY 3 (THREE) TIMES A WEEK    . LANTUS SOLOSTAR 100 UNIT/ML Solostar Pen Inject 28 Units into the skin at bedtime.   0  . losartan (COZAAR) 50 MG tablet Take 50 mg by mouth daily.  3  . metFORMIN (GLUCOPHAGE) 1000 MG tablet Take 1,000 mg by mouth 2 (two) times daily.  0  . metoprolol succinate (TOPROL-XL) 100 MG 24 hr tablet Take 1 tablet (100 mg total) by mouth daily. Take with or immediately following a meal. 90 tablet 3  . pimecrolimus (ELIDEL) 1 % cream Apply 1 application topically 2 (two) times daily as needed (psoriasis).     . probenecid (BENEMID) 500 MG tablet Take 500 mg by mouth daily.     . simvastatin (ZOCOR) 40 MG tablet Take 40 mg by mouth every evening.     . tamsulosin (FLOMAX) 0.4 MG CAPS capsule Take 0.4 mg by mouth every evening.   4  . vitamin B-12 (CYANOCOBALAMIN) 1000 MCG tablet Take 1,000 mcg by mouth daily.    Alveda Reasons 20 MG TABS tablet TAKE 1 TABLET (20 MG TOTAL) BY MOUTH DAILY WITH SUPPER. 30 tablet 11   No current facility-administered medications for this visit.     Allergies:   Indomethacin   Social History:  The patient  reports that he has never smoked. He has never used smokeless tobacco. He reports that he does not drink alcohol or use drugs.   Family History:  The patient's family history includes CVA in his father; Coronary artery disease in his mother.   ROS:  Please see the history of present illness.   Otherwise, review of systems is positive for none.   All other systems are reviewed and  negative.   PHYSICAL EXAM: VS:  BP 122/62   Pulse 81   Ht 5\' 9"  (1.753 m)   Wt 277 lb (125.6 kg)   BMI 40.91 kg/m  , BMI Body mass index is 40.91 kg/m. GEN: Well nourished, well developed, in no acute distress  HEENT: normal  Neck: no JVD, carotid bruits, or masses Cardiac: iRRR; no murmurs, rubs, or gallops,no edema  Respiratory:  clear to auscultation bilaterally, normal work of breathing GI: soft, nontender, nondistended, + BS MS: no deformity or atrophy  Skin: warm and dry Neuro:  Strength and sensation are intact Psych: euthymic mood, full affect  EKG:  EKG is ordered today. Personal review of the ekg ordered shows atrial fibrillation, rate 81   Recent Labs: 02/09/2018: BUN 14; Creatinine, Ser 1.24; Hemoglobin 15.0; Platelets 312; Potassium  5.0; Sodium 141    Lipid Panel  No results found for: CHOL, TRIG, HDL, CHOLHDL, VLDL, LDLCALC, LDLDIRECT   Wt Readings from Last 3 Encounters:  06/26/18 277 lb (125.6 kg)  03/27/18 276 lb (125.2 kg)  02/23/18 270 lb (122.5 kg)      Other studies Reviewed: Additional studies/ records that were reviewed today include: TTE 12/28/14  Review of the above records today demonstrates:  - Left ventricle: The cavity size was normal. There was mild concentric hypertrophy. Systolic function was normal. The estimated ejection fraction was in the range of 55% to 60%. Although no diagnostic regional wall motion abnormality was identified, this possibility cannot be completely excluded on the basis of this study. Left ventricular diastolic function parameters were normal. - Left atrium: The atrium was severely dilated. - Pulmonary arteries: Systolic pressure was mildly increased. PA peak pressure: 38 mm Hg (S).   ASSESSMENT AND PLAN:  1.  PVCs: Status post ablation 03/22/2015.  No recurrent PVCs  2.  Persistent atrial fibrillation: On Xarelto.  Status post AF ablation 12/13/2017.  Unfortunately he has gone back into  atrial fibrillation.  He would like to try and avoid antiarrhythmic medications.  We will plan for cardioversion with follow-up in AF clinic.  Should he have more atrial fibrillation, would likely start flecainide.  This patients CHA2DS2-VASc Score and unadjusted Ischemic Stroke Rate (% per year) is equal to 3.2 % stroke rate/year from a score of 3  Above score calculated as 1 point each if present [CHF, HTN, DM, Vascular=MI/PAD/Aortic Plaque, Age if 65-74, or Male] Above score calculated as 2 points each if present [Age > 75, or Stroke/TIA/TE]   3.  Typical atrial flutter: Status post ablation 02/26/2018.  No recurrence.  Current medicines are reviewed at length with the patient today.   The patient does not have concerns regarding his medicines.  The following changes were made today: None  Labs/ tests ordered today include:  Orders Placed This Encounter  Procedures  . EKG 12-Lead     Disposition:   FU with Will Camnitz 6 months  Signed, [name], MD  06/26/2018 8:56 AM     [facility] [address] [place] [address] [phone] (office) [phone] (fax)

## 2018-06-26 NOTE — H&P (View-Only) (Signed)
Electrophysiology Office Note   Date:  06/26/2018   ID:  LYNELL KUSSMAN, DOB [birth year], MRN [id number]  PCP:  Kathyrn Lass, MD  Cardiologist:  Marlou Porch Primary Electrophysiologist:  Ilma Achee Meredith Leeds, MD    No chief complaint on file.    History of Present Illness: Mark Singh is a 73 y.o. male who presents today for electrophysiology evaluation.   history of PVC's, bradycardia, nephrotic syndrome (complete responder to steroid therapy by Dr. Lorrene Reid), DM, HTN, HLD, obesity, orthostatic hypotension, and mild pulm HTN felt 2/2 obesity Comes in today now s/p ablation procedure for his PVC's.  PVCs were mapped to inferior to the [facility] and successfully ablated on 03/30/15.  He subsequently went into atrial fibrillation and had ablation 12/13/2017.  He subsequently presented back to atrial fibrillation clinic in atrial flutter.  He is status post atrial flutter ablation on 02/26/2018.  Today, denies symptoms of palpitations, chest pain, shortness of breath, orthopnea, PND, lower extremity edema, claudication, dizziness, presyncope, syncope, bleeding, or neurologic sequela. The patient is tolerating medications without difficulties.  Overall he is feeling well.  Unfortunately he has gone back into atrial fibrillation.  He is unclear as to when this occurred.  He has no chest pain or shortness of breath.  At this point he would like to try to return back to normal rhythm.  Past Medical History:  Diagnosis Date  . Arthritis    "hands, feet" (12/13/2017)  . Asthma   . Bradycardia    a. Repoted h/o HR in the 40s.  . Edema    a. 2D echo 11/2013: EF 44-31%, LV diastolic function parameters were normal, severely dilated LA, PASP 74mmHg..  . H/O: gout    right foot; on daily RX" (12/13/2017)  . HTN (hypertension)   . Hyperlipidemia   . Myalgia and myositis, unspecified   . Nephrotic syndrome    a. responder to steroid therapy by Dr. Lorrene Reid.   . Obesity   . Orthostatic hypotension    . Penile cancer ([place])    "did circ"  . Pulmonary hypertension ([place])    a. Mild pulm HTN felt 2/2 obesity.  [name] PVC's (premature ventricular contractions)    a. Holter 05/2013 showing 18k PVCs (18% of the time).  . Seasonal asthma    mild  . Shoulder pain   . Squamous carcinoma    "right hand; left shoulder" (12/13/2017)  . Type 2 diabetes mellitus ([place])    Past Surgical History:  Procedure Laterality Date  . A-FLUTTER ABLATION N/A 02/23/2018   Procedure: A-FLUTTER ABLATION;  Surgeon: Constance Haw, MD;  Location: [place] [facility];  Service: Cardiovascular;  Laterality: N/A;  . ATRIAL FIBRILLATION ABLATION N/A 12/13/2017   Procedure: ATRIAL FIBRILLATION ABLATION;  Surgeon: Constance Haw, MD;  Location: [place] [facility];  Service: Cardiovascular;  Laterality: N/A;  . BUNIONECTOMY WITH HAMMERTOE RECONSTRUCTION Left 1992  &  2008   REMOVAL HEAL SPUR/ BUNIONECTOMY AND HAMMERTOE REPAIR   . CARDIOVERSION N/A 07/22/2015   Procedure: CARDIOVERSION;  Surgeon: Dorothy Spark, MD;  Location: [place];  Service: Cardiovascular;  Laterality: N/A;  . CARDIOVERSION N/A 10/06/2015   Procedure: CARDIOVERSION;  Surgeon: Sueanne Margarita, MD;  Location: [facility] ENDOSCOPY;  Service: Cardiovascular;  Laterality: N/A;  . CARDIOVERSION N/A 12/05/2016   Procedure: CARDIOVERSION;  Surgeon: Pixie Casino, MD;  Location: [facility] ENDOSCOPY;  Service: Cardiovascular;  Laterality: N/A;  . CIRCUMCISION N/A 11/27/2012   Procedure:  CIRCUMCISION  AND EXCISION OF  GLANS PENIS;  Surgeon: Fredricka Bonine, MD;  Location: [facility];  Service: Urology;  Laterality: N/A;  . ELECTROPHYSIOLOGIC STUDY N/A 03/30/2015   Procedure: PVC Ablation;  Surgeon: Liticia Gasior Meredith Leeds, MD;  Location: [place] [facility];  Service: Cardiovascular;  Laterality: N/A;  . HERNIA REPAIR    . SQUAMOUS CELL CARCINOMA EXCISION     "right hand; left shoulder" (12/13/2017)  . UMBILICAL HERNIA REPAIR  11-04-2005      Current Outpatient Medications  Medication Sig Dispense Refill  . Cholecalciferol (VITAMIN D3) 5000 units CAPS Take 5,000 Units by mouth daily.     [name] desonide (DESOWEN) 0.05 % cream Apply 1 application topically 2 (two) times daily as needed (psoriasis).    . Fluticasone-Salmeterol (WIXELA INHUB) 100-50 MCG/DOSE AEPB Inhale 1 puff into the lungs 2 (two) times daily as needed (for respiratory issues.).     [name] imiquimod (ALDARA) 5 % cream APPLY 1 PACKET TOPICALLY 3 (THREE) TIMES A WEEK    . LANTUS SOLOSTAR 100 UNIT/ML Solostar Pen Inject 28 Units into the skin at bedtime.   0  . losartan (COZAAR) 50 MG tablet Take 50 mg by mouth daily.  3  . metFORMIN (GLUCOPHAGE) 1000 MG tablet Take 1,000 mg by mouth 2 (two) times daily.  0  . metoprolol succinate (TOPROL-XL) 100 MG 24 hr tablet Take 1 tablet (100 mg total) by mouth daily. Take with or immediately following a meal. 90 tablet 3  . pimecrolimus (ELIDEL) 1 % cream Apply 1 application topically 2 (two) times daily as needed (psoriasis).     . probenecid (BENEMID) 500 MG tablet Take 500 mg by mouth daily.     . simvastatin (ZOCOR) 40 MG tablet Take 40 mg by mouth every evening.     . tamsulosin (FLOMAX) 0.4 MG CAPS capsule Take 0.4 mg by mouth every evening.   4  . vitamin B-12 (CYANOCOBALAMIN) 1000 MCG tablet Take 1,000 mcg by mouth daily.    Alveda Reasons 20 MG TABS tablet TAKE 1 TABLET (20 MG TOTAL) BY MOUTH DAILY WITH SUPPER. 30 tablet 11   No current facility-administered medications for this visit.     Allergies:   Indomethacin   Social History:  The patient  reports that he has never smoked. He has never used smokeless tobacco. He reports that he does not drink alcohol or use drugs.   Family History:  The patient's family history includes CVA in his father; Coronary artery disease in his mother.   ROS:  Please see the history of present illness.   Otherwise, review of systems is positive for none.   All other systems are reviewed and  negative.   PHYSICAL EXAM: VS:  BP 122/62   Pulse 81   Ht 5\' 9"  (1.753 m)   Wt 277 lb (125.6 kg)   BMI 40.91 kg/m  , BMI Body mass index is 40.91 kg/m. GEN: Well nourished, well developed, in no acute distress  HEENT: normal  Neck: no JVD, carotid bruits, or masses Cardiac: iRRR; no murmurs, rubs, or gallops,no edema  Respiratory:  clear to auscultation bilaterally, normal work of breathing GI: soft, nontender, nondistended, + BS MS: no deformity or atrophy  Skin: warm and dry Neuro:  Strength and sensation are intact Psych: euthymic mood, full affect  EKG:  EKG is ordered today. Personal review of the ekg ordered shows atrial fibrillation, rate 81   Recent Labs: 02/09/2018: BUN 14; Creatinine, Ser 1.24; Hemoglobin 15.0; Platelets 312; Potassium  5.0; Sodium 141    Lipid Panel  No results found for: CHOL, TRIG, HDL, CHOLHDL, VLDL, LDLCALC, LDLDIRECT   Wt Readings from Last 3 Encounters:  06/26/18 277 lb (125.6 kg)  03/27/18 276 lb (125.2 kg)  02/23/18 270 lb (122.5 kg)      Other studies Reviewed: Additional studies/ records that were reviewed today include: TTE 12/28/14  Review of the above records today demonstrates:  - Left ventricle: The cavity size was normal. There was mild concentric hypertrophy. Systolic function was normal. The estimated ejection fraction was in the range of 55% to 60%. Although no diagnostic regional wall motion abnormality was identified, this possibility cannot be completely excluded on the basis of this study. Left ventricular diastolic function parameters were normal. - Left atrium: The atrium was severely dilated. - Pulmonary arteries: Systolic pressure was mildly increased. PA peak pressure: 38 mm Hg (S).   ASSESSMENT AND PLAN:  1.  PVCs: Status post ablation 03/22/2015.  No recurrent PVCs  2.  Persistent atrial fibrillation: On Xarelto.  Status post AF ablation 12/13/2017.  Unfortunately he has gone back into  atrial fibrillation.  He would like to try and avoid antiarrhythmic medications.  We Rafal Archuleta plan for cardioversion with follow-up in AF clinic.  Should he have more atrial fibrillation, would likely start flecainide.  This patients CHA2DS2-VASc Score and unadjusted Ischemic Stroke Rate (% per year) is equal to 3.2 % stroke rate/year from a score of 3  Above score calculated as 1 point each if present [CHF, HTN, DM, Vascular=MI/PAD/Aortic Plaque, Age if 65-74, or Male] Above score calculated as 2 points each if present [Age > 75, or Stroke/TIA/TE]   3.  Typical atrial flutter: Status post ablation 02/26/2018.  No recurrence.  Current medicines are reviewed at length with the patient today.   The patient does not have concerns regarding his medicines.  The following changes were made today: None  Labs/ tests ordered today include:  Orders Placed This Encounter  Procedures  . EKG 12-Lead     Disposition:   FU with Sahith Nurse 6 months  Signed, Parthiv Mucci Meredith Leeds, MD  06/26/2018 8:56 AM     [facility] [address] [place] [address] [phone] (office) [phone] (fax)

## 2018-06-28 NOTE — Anesthesia Preprocedure Evaluation (Addendum)
Anesthesia Evaluation  Patient identified by MRN, date of birth, ID band Patient awake    Reviewed: Allergy & Precautions, NPO status , Patient's Chart, lab work & pertinent test results, reviewed documented beta blocker date and time   Airway Mallampati: II  TM Distance: >3 FB Neck ROM: Full    Dental  (+) Chipped,    Pulmonary asthma ,    Pulmonary exam normal breath sounds clear to auscultation       Cardiovascular hypertension, Pt. on home beta blockers Normal cardiovascular exam+ dysrhythmias Atrial Fibrillation  Rhythm:Regular Rate:Normal  ECG: a-fib, rate 81  Pulmonary hypertension  Exercise tolerance test (2017) There was no ST segment deviation noted during stress. There were no adverse arrhythmias during exercise. QRS did not widen. Isolated PVC noted Low risk flecainide exercise challenge [name], MD   Neuro/Psych negative neurological ROS  negative psych ROS   GI/Hepatic Neg liver ROS, GERD  Controlled,  Endo/Other  diabetes, Insulin Dependent, Oral Hypoglycemic AgentsMorbid obesity  Renal/GU negative Renal ROS     Musculoskeletal Gout   Abdominal (+) + obese,   Peds  Hematology HLD   Anesthesia Other Findings A-fib  Reproductive/Obstetrics                            Anesthesia Physical Anesthesia Plan  ASA: III  Anesthesia Plan: General   Post-op Pain Management:    Induction: Intravenous  PONV Risk Score and Plan: 2 and Treatment may vary due to age or medical condition and Propofol infusion  Airway Management Planned: Mask  Additional Equipment:   Intra-op Plan:   Post-operative Plan:   Informed Consent: I have reviewed the patients History and Physical, chart, labs and discussed the procedure including the risks, benefits and alternatives for the proposed anesthesia with the patient or authorized representative who has indicated his/her understanding and  acceptance.     Dental advisory given  Plan Discussed with: CRNA  Anesthesia Plan Comments:        Anesthesia Quick Evaluation

## 2018-06-29 ENCOUNTER — Ambulatory Visit (HOSPITAL_COMMUNITY)
Admission: RE | Admit: 2018-06-29 | Discharge: 2018-06-29 | Disposition: A | Discharge status: Home / Self Care | Payer: Medicare Other | Attending: Cardiovascular Disease | Admitting: Cardiovascular Disease

## 2018-06-29 ENCOUNTER — Encounter (HOSPITAL_COMMUNITY): Payer: Self-pay

## 2018-06-29 ENCOUNTER — Ambulatory Visit (HOSPITAL_COMMUNITY): Payer: Medicare Other | Admitting: Anesthesiology

## 2018-06-29 ENCOUNTER — Encounter (HOSPITAL_COMMUNITY)
Admission: RE | Disposition: A | Discharge status: Home / Self Care | Payer: Self-pay | Source: Home / Self Care | Attending: Cardiovascular Disease

## 2018-06-29 ENCOUNTER — Other Ambulatory Visit: Payer: Self-pay

## 2018-06-29 DIAGNOSIS — I493 Ventricular premature depolarization: Secondary | ICD-10-CM | POA: Insufficient documentation

## 2018-06-29 DIAGNOSIS — E119 Type 2 diabetes mellitus without complications: Secondary | ICD-10-CM | POA: Insufficient documentation

## 2018-06-29 DIAGNOSIS — I10 Essential (primary) hypertension: Secondary | ICD-10-CM | POA: Insufficient documentation

## 2018-06-29 DIAGNOSIS — Z794 Long term (current) use of insulin: Secondary | ICD-10-CM | POA: Diagnosis not present

## 2018-06-29 DIAGNOSIS — E785 Hyperlipidemia, unspecified: Secondary | ICD-10-CM | POA: Insufficient documentation

## 2018-06-29 DIAGNOSIS — Z6841 Body Mass Index (BMI) 40.0 and over, adult: Secondary | ICD-10-CM | POA: Diagnosis not present

## 2018-06-29 DIAGNOSIS — M1990 Unspecified osteoarthritis, unspecified site: Secondary | ICD-10-CM | POA: Insufficient documentation

## 2018-06-29 DIAGNOSIS — Z7984 Long term (current) use of oral hypoglycemic drugs: Secondary | ICD-10-CM | POA: Diagnosis not present

## 2018-06-29 DIAGNOSIS — I4819 Other persistent atrial fibrillation: Secondary | ICD-10-CM | POA: Diagnosis not present

## 2018-06-29 DIAGNOSIS — E669 Obesity, unspecified: Secondary | ICD-10-CM | POA: Insufficient documentation

## 2018-06-29 DIAGNOSIS — M109 Gout, unspecified: Secondary | ICD-10-CM | POA: Insufficient documentation

## 2018-06-29 DIAGNOSIS — Z79899 Other long term (current) drug therapy: Secondary | ICD-10-CM | POA: Insufficient documentation

## 2018-06-29 DIAGNOSIS — I272 Pulmonary hypertension, unspecified: Secondary | ICD-10-CM | POA: Diagnosis not present

## 2018-06-29 DIAGNOSIS — Z8249 Family history of ischemic heart disease and other diseases of the circulatory system: Secondary | ICD-10-CM | POA: Insufficient documentation

## 2018-06-29 DIAGNOSIS — Z7901 Long term (current) use of anticoagulants: Secondary | ICD-10-CM | POA: Diagnosis not present

## 2018-06-29 DIAGNOSIS — I951 Orthostatic hypotension: Secondary | ICD-10-CM | POA: Insufficient documentation

## 2018-06-29 HISTORY — PX: CARDIOVERSION: SHX1299

## 2018-06-29 LAB — GLUCOSE, CAPILLARY: Glucose-Capillary: 159 mg/dL — ABNORMAL HIGH (ref 70–99)

## 2018-06-29 SURGERY — CARDIOVERSION
Anesthesia: General

## 2018-06-29 MED ORDER — SODIUM CHLORIDE 0.9 % IV SOLN
INTRAVENOUS | Status: AC | PRN
  Administered 2018-06-29: 500 mL via INTRAVENOUS
  Administered 2018-06-29: 08:00:00 via INTRAVENOUS

## 2018-06-29 NOTE — Transfer of Care (Signed)
Immediate Anesthesia Transfer of Care Note  Patient: Mark Singh  Procedure(s) Performed: CARDIOVERSION (N/A )  Patient Location: Endoscopy Unit  Anesthesia Type:General  Level of Consciousness: awake, alert  and oriented  Airway & Oxygen Therapy: Patient Spontanous Breathing and Patient connected to nasal cannula oxygen  Post-op Assessment: Report given to RN, Post -op Vital signs reviewed and stable and Patient moving all extremities X 4  Post vital signs: Reviewed and stable  Last Vitals:  Vitals Value Taken Time  BP    Temp    Pulse 54 06/29/2018  8:26 AM  Resp 24 06/29/2018  8:26 AM  SpO2 98 % 06/29/2018  8:26 AM  Vitals shown include unvalidated device data.  Last Pain:  Vitals:   06/29/18 0826  TempSrc: Oral  PainSc:          Complications: No apparent anesthesia complications

## 2018-06-29 NOTE — Discharge Instructions (Signed)
Electrical Cardioversion, Care After °This sheet gives you information about how to care for yourself after your procedure. Your health care provider may also give you more specific instructions. If you have problems or questions, contact your health care provider. °What can I expect after the procedure? °After the procedure, it is common to have: °· Some redness on the skin where the shocks were given. °Follow these instructions at home: ° °· Do not drive for 24 hours if you were given a medicine to help you relax (sedative). °· Take over-the-counter and prescription medicines only as told by your health care provider. °· Ask your health care provider how to check your pulse. Check it often. °· Rest for 48 hours after the procedure or as told by your health care provider. °· Avoid or limit your caffeine use as told by your health care provider. °Contact a health care provider if: °· You feel like your heart is beating too quickly or your pulse is not regular. °· You have a serious muscle cramp that does not go away. °Get help right away if: ° °· You have discomfort in your chest. °· You are dizzy or you feel faint. °· You have trouble breathing or you are short of breath. °· Your speech is slurred. °· You have trouble moving an arm or leg on one side of your body. °· Your fingers or toes turn cold or blue. °This information is not intended to replace advice given to you by your health care provider. Make sure you discuss any questions you have with your health care provider. °Document Released: 02/06/2013 Document Revised: 11/20/2015 Document Reviewed: 10/23/2015 °Elsevier Interactive Patient Education © 2019 [employer] ° °

## 2018-06-29 NOTE — CV Procedure (Signed)
    Cardioversion Note  Mark Singh [id number] 06/27/1945  Procedure: DC Cardioversion Indications: Atrial fib   Procedure Details Consent: Obtained Time Out: Verified patient identification, verified procedure, site/side was marked, verified correct patient position, special equipment/implants available, Radiology Safety Procedures followed,  medications/allergies/relevent history reviewed, required imaging and test results available.  Performed  The patient has been on adequate anticoagulation.  The patient received IV Lidocaine 60 mg followed by Propofol 70 mg  for sedation.  Synchronous cardioversion was performed at 200  joules.  The cardioversion was successful     Complications: No apparent complications Patient did tolerate procedure well.   Thayer Headings, Brooke Bonito., MD, [facility] 06/29/2018, 8:27 AM

## 2018-06-29 NOTE — Interval H&P Note (Signed)
History and Physical Interval Note:  06/29/2018 8:15 AM  Mark Singh  has presented today for surgery, with the diagnosis of afib  The various methods of treatment have been discussed with the patient and family. After consideration of risks, benefits and other options for treatment, the patient has consented to  Procedure(s): CARDIOVERSION (N/A) as a surgical intervention .  The patient's history has been reviewed, patient examined, no change in status, stable for surgery.  I have reviewed the patient's chart and labs.  Questions were answered to the patient's satisfaction.     [name]

## 2018-07-01 ENCOUNTER — Encounter (HOSPITAL_COMMUNITY): Payer: Self-pay | Admitting: Cardiovascular Disease

## 2018-07-02 NOTE — Anesthesia Postprocedure Evaluation (Signed)
Anesthesia Post Note  Patient: Mark Singh  Procedure(s) Performed: CARDIOVERSION (N/A )     Patient location during evaluation: PACU Anesthesia Type: General Level of consciousness: awake and alert Pain management: pain level controlled Vital Signs Assessment: post-procedure vital signs reviewed and stable Respiratory status: spontaneous breathing, nonlabored ventilation, respiratory function stable and patient connected to nasal cannula oxygen Cardiovascular status: blood pressure returned to baseline and stable Postop Assessment: no apparent nausea or vomiting Anesthetic complications: no    Last Vitals:  Vitals:   06/29/18 0840 06/29/18 0845  BP: (!) 106/45 (!) 109/59  Pulse: (!) 58 (!) 54  Resp: 19 (!) 27  Temp:    SpO2: 97% 96%    Last Pain:  Vitals:   07/02/18 1355  TempSrc:   PainSc: 0-No pain                 Zay Yeargan P Ozias Dicenzo

## 2018-07-25 ENCOUNTER — Other Ambulatory Visit: Payer: Self-pay | Admitting: *Deleted

## 2018-07-25 MED ORDER — METOPROLOL SUCCINATE ER 100 MG PO TB24: 100.0000 mg | ORAL_TABLET | Freq: Every day | ORAL | 3 refills | Status: DC

## 2018-08-28 ENCOUNTER — Encounter (HOSPITAL_COMMUNITY): Payer: Self-pay

## 2018-08-28 ENCOUNTER — Encounter (HOSPITAL_COMMUNITY): Payer: Self-pay | Admitting: Physician Assistant

## 2018-08-28 ENCOUNTER — Ambulatory Visit (HOSPITAL_COMMUNITY)
Admission: RE | Admit: 2018-08-28 | Discharge: 2018-08-28 | Disposition: A | Discharge status: Home / Self Care | Payer: Medicare Other | Source: Ambulatory Visit | Attending: Physician Assistant | Admitting: Physician Assistant

## 2018-08-28 ENCOUNTER — Other Ambulatory Visit (HOSPITAL_COMMUNITY): Payer: Self-pay | Admitting: *Deleted

## 2018-08-28 ENCOUNTER — Other Ambulatory Visit: Payer: Self-pay

## 2018-08-28 VITALS — BP 139/73 | HR 74 | Ht 69.0 in | Wt 275.0 lb

## 2018-08-28 DIAGNOSIS — I4819 Other persistent atrial fibrillation: Secondary | ICD-10-CM | POA: Diagnosis not present

## 2018-08-28 MED ORDER — FLECAINIDE ACETATE 50 MG PO TABS: 50.0000 mg | ORAL_TABLET | Freq: Two times a day (BID) | ORAL | 3 refills | Status: DC

## 2018-08-28 NOTE — Patient Instructions (Signed)
DASH Eating Plan  DASH stands for "Dietary Approaches to Stop Hypertension." The DASH eating plan is a healthy eating plan that has been shown to reduce high blood pressure (hypertension). It may also reduce your risk for type 2 diabetes, heart disease, and stroke. The DASH eating plan may also help with weight loss.  What are tips for following this plan?    General guidelines   Avoid eating more than 2,300 mg (milligrams) of salt (sodium) a day. If you have hypertension, you may need to reduce your sodium intake to 1,500 mg a day.   Limit alcohol intake to no more than 1 drink a day for nonpregnant women and 2 drinks a day for men. One drink equals 12 oz of beer, 5 oz of wine, or 1 oz of hard liquor.   Work with your health care provider to maintain a healthy body weight or to lose weight. Ask what an ideal weight is for you.   Get at least 30 minutes of exercise that causes your heart to beat faster (aerobic exercise) most days of the week. Activities may include walking, swimming, or biking.   Work with your health care provider or diet and nutrition specialist (dietitian) to adjust your eating plan to your individual calorie needs.  Reading food labels     Check food labels for the amount of sodium per serving. Choose foods with less than 5 percent of the Daily Value of sodium. Generally, foods with less than 300 mg of sodium per serving fit into this eating plan.   To find whole grains, look for the word "whole" as the first word in the ingredient list.  Shopping   Buy products labeled as "low-sodium" or "no salt added."   Buy fresh foods. Avoid canned foods and premade or frozen meals.  Cooking   Avoid adding salt when cooking. Use salt-free seasonings or herbs instead of table salt or sea salt. Check with your health care provider or pharmacist before using salt substitutes.   Do not fry foods. Cook foods using healthy methods such as baking, boiling, grilling, and broiling instead.   Cook with  heart-healthy oils, such as olive, canola, soybean, or sunflower oil.  Meal planning   Eat a balanced diet that includes:  ? 5 or more servings of fruits and vegetables each day. At each meal, try to fill half of your plate with fruits and vegetables.  ? Up to 6-8 servings of whole grains each day.  ? Less than 6 oz of lean meat, poultry, or fish each day. A 3-oz serving of meat is about the same size as a deck of cards. One egg equals 1 oz.  ? 2 servings of low-fat dairy each day.  ? A serving of nuts, seeds, or beans 5 times each week.  ? Heart-healthy fats. Healthy fats called Omega-3 fatty acids are found in foods such as flaxseeds and coldwater fish, like sardines, salmon, and mackerel.   Limit how much you eat of the following:  ? Canned or prepackaged foods.  ? Food that is high in trans fat, such as fried foods.  ? Food that is high in saturated fat, such as fatty meat.  ? Sweets, desserts, sugary drinks, and other foods with added sugar.  ? Full-fat dairy products.   Do not salt foods before eating.   Try to eat at least 2 vegetarian meals each week.   Eat more home-cooked food and less restaurant, buffet, and fast food.     When eating at a restaurant, ask that your food be prepared with less salt or no salt, if possible.  What foods are recommended?  The items listed may not be a complete list. Talk with your dietitian about what dietary choices are best for you.  Grains  Whole-grain or whole-wheat bread. Whole-grain or whole-wheat pasta. Brown rice. Oatmeal. Quinoa. Bulgur. Whole-grain and low-sodium cereals. Pita bread. Low-fat, low-sodium crackers. Whole-wheat flour tortillas.  Vegetables  Fresh or frozen vegetables (raw, steamed, roasted, or grilled). Low-sodium or reduced-sodium tomato and vegetable juice. Low-sodium or reduced-sodium tomato sauce and tomato paste. Low-sodium or reduced-sodium canned vegetables.  Fruits  All fresh, dried, or frozen fruit. Canned fruit in natural juice (without  added sugar).  Meat and other protein foods  Skinless chicken or turkey. Ground chicken or turkey. Pork with fat trimmed off. Fish and seafood. Egg whites. Dried beans, peas, or lentils. Unsalted nuts, nut butters, and seeds. Unsalted canned beans. Lean cuts of beef with fat trimmed off. Low-sodium, lean deli meat.  Dairy  Low-fat (1%) or fat-free (skim) milk. Fat-free, low-fat, or reduced-fat cheeses. Nonfat, low-sodium ricotta or cottage cheese. Low-fat or nonfat yogurt. Low-fat, low-sodium cheese.  Fats and oils  Soft margarine without trans fats. Vegetable oil. Low-fat, reduced-fat, or light mayonnaise and salad dressings (reduced-sodium). Canola, safflower, olive, soybean, and sunflower oils. Avocado.  Seasoning and other foods  Herbs. Spices. Seasoning mixes without salt. Unsalted popcorn and pretzels. Fat-free sweets.  What foods are not recommended?  The items listed may not be a complete list. Talk with your dietitian about what dietary choices are best for you.  Grains  Baked goods made with fat, such as croissants, muffins, or some breads. Dry pasta or rice meal packs.  Vegetables  Creamed or fried vegetables. Vegetables in a cheese sauce. Regular canned vegetables (not low-sodium or reduced-sodium). Regular canned tomato sauce and paste (not low-sodium or reduced-sodium). Regular tomato and vegetable juice (not low-sodium or reduced-sodium). Pickles. Olives.  Fruits  Canned fruit in a light or heavy syrup. Fried fruit. Fruit in cream or butter sauce.  Meat and other protein foods  Fatty cuts of meat. Ribs. Fried meat. Bacon. Sausage. Bologna and other processed lunch meats. Salami. Fatback. Hotdogs. Bratwurst. Salted nuts and seeds. Canned beans with added salt. Canned or smoked fish. Whole eggs or egg yolks. Chicken or turkey with skin.  Dairy  Whole or 2% milk, cream, and half-and-half. Whole or full-fat cream cheese. Whole-fat or sweetened yogurt. Full-fat cheese. Nondairy creamers. Whipped toppings.  Processed cheese and cheese spreads.  Fats and oils  Butter. Stick margarine. Lard. Shortening. Ghee. Bacon fat. Tropical oils, such as coconut, palm kernel, or palm oil.  Seasoning and other foods  Salted popcorn and pretzels. Onion salt, garlic salt, seasoned salt, table salt, and sea salt. Worcestershire sauce. Tartar sauce. Barbecue sauce. Teriyaki sauce. Soy sauce, including reduced-sodium. Steak sauce. Canned and packaged gravies. Fish sauce. Oyster sauce. Cocktail sauce. Horseradish that you find on the shelf. Ketchup. Mustard. Meat flavorings and tenderizers. Bouillon cubes. Hot sauce and Tabasco sauce. Premade or packaged marinades. Premade or packaged taco seasonings. Relishes. Regular salad dressings.  Where to find more information:   National Heart, Lung, and Blood Institute: www.nhlbi.nih.gov   American Heart Association: www.heart.org  Summary   The DASH eating plan is a healthy eating plan that has been shown to reduce high blood pressure (hypertension). It may also reduce your risk for type 2 diabetes, heart disease, and stroke.   With the   DASH eating plan, you should limit salt (sodium) intake to 2,300 mg a day. If you have hypertension, you may need to reduce your sodium intake to 1,500 mg a day.   When on the DASH eating plan, aim to eat more fresh fruits and vegetables, whole grains, lean proteins, low-fat dairy, and heart-healthy fats.   Work with your health care provider or diet and nutrition specialist (dietitian) to adjust your eating plan to your individual calorie needs.  This information is not intended to replace advice given to you by your health care provider. Make sure you discuss any questions you have with your health care provider.  Document Released: 04/07/2011 Document Revised: 04/11/2016 Document Reviewed: 04/11/2016  Elsevier Interactive Patient Education  2019 [employer] Inc[employer]

## 2018-08-28 NOTE — Progress Notes (Signed)
Electrophysiology TeleHealth Note   Due to national recommendations of social distancing due to [place] 19, Audio/video telehealth visit is felt to be most appropriate for this patient at this time.  See MyChart message from today for patient consent regarding telehealth for the Atrial Fibrillation Clinic.    Date:  08/28/2018   ID:  Mark Singh, DOB 1946/04/21, MRN [id number]  Location: home  Provider location: [address] [place], [place] [address] Evaluation Performed: Follow up  PCP:  Kathyrn Lass, MD  Primary Cardiologist:  Dr Marlou Porch Primary Electrophysiologist: Dr Curt Bears   CC: Follow up for atrial fibrillation   History of Present Illness: Mark Singh is a 73 y.o. male who presents via audio/video conferencing for a telehealth visit today. Patient s/p DCCV on 06/29/18. He states that he felt well for about one month until the beginning of April when his Apple watch ECG read as afib. Per patient, his watch has not showed SR since then. Watch reads as rate controlled afib today. He is mildly symptomatic with increased SOB. He is still active by walking 2 miles 6 days per week.   Today, he denies symptoms of palpitations, chest pain, orthopnea, PND, lower extremity edema, claudication, dizziness, presyncope, syncope, bleeding, or neurologic sequela. The patient is tolerating medications without difficulties and is otherwise without complaint today.   he denies symptoms of cough, fevers, chills, or new SOB worrisome for COVID 19.     Atrial Fibrillation Risk Factors:  he does not have symptoms or diagnosis of sleep apnea. he does not have a history of rheumatic fever. he does not have a history of alcohol use.   he has a BMI of Body mass index is 40.61 kg/m.[name] Filed Weights   08/28/18 1125  Weight: 124.7 kg    Past Medical History:  Diagnosis Date  . Arthritis    "hands, feet" (12/13/2017)  . Asthma   . Bradycardia    a. Repoted h/o HR in the  40s.  . Edema    a. 2D echo 11/2013: EF 28-41%, LV diastolic function parameters were normal, severely dilated LA, PASP 39mmHg..  . H/O: gout    right foot; on daily RX" (12/13/2017)  . HTN (hypertension)   . Hyperlipidemia   . Myalgia and myositis, unspecified   . Nephrotic syndrome    a. responder to steroid therapy by Dr. Lorrene Reid.   . Obesity   . Orthostatic hypotension   . Penile cancer ([place])    "did circ"  . Pulmonary hypertension ([place])    a. Mild pulm HTN felt 2/2 obesity.  [name] PVC's (premature ventricular contractions)    a. Holter 05/2013 showing 18k PVCs (18% of the time).  . Seasonal asthma    mild  . Shoulder pain   . Squamous carcinoma    "right hand; left shoulder" (12/13/2017)  . Type 2 diabetes mellitus ([place])    Past Surgical History:  Procedure Laterality Date  . A-FLUTTER ABLATION N/A 02/23/2018   Procedure: A-FLUTTER ABLATION;  Surgeon: Constance Haw, MD;  Location: [facility];  Service: Cardiovascular;  Laterality: N/A;  . ATRIAL FIBRILLATION ABLATION N/A 12/13/2017   Procedure: ATRIAL FIBRILLATION ABLATION;  Surgeon: Constance Haw, MD;  Location: [facility];  Service: Cardiovascular;  Laterality: N/A;  . BUNIONECTOMY WITH HAMMERTOE RECONSTRUCTION Left 1992  &  2008   REMOVAL HEAL SPUR/ BUNIONECTOMY AND HAMMERTOE REPAIR   . CARDIOVERSION N/A 07/22/2015   Procedure: CARDIOVERSION;  Surgeon:  Dorothy Spark, MD;  Location: [facility];  Service: Cardiovascular;  Laterality: N/A;  . CARDIOVERSION N/A 10/06/2015   Procedure: CARDIOVERSION;  Surgeon: Sueanne Margarita, MD;  Location: [facility];  Service: Cardiovascular;  Laterality: N/A;  . CARDIOVERSION N/A 12/05/2016   Procedure: CARDIOVERSION;  Surgeon: Pixie Casino, MD;  Location: [facility] ENDOSCOPY;  Service: Cardiovascular;  Laterality: N/A;  . CARDIOVERSION N/A 06/29/2018   Procedure: CARDIOVERSION;  Surgeon: Thayer Headings, MD;  Location: [facility] ENDOSCOPY;  Service: Cardiovascular;   Laterality: N/A;  . CIRCUMCISION N/A 11/27/2012   Procedure:  CIRCUMCISION  AND EXCISION OF GLANS PENIS;  Surgeon: Fredricka Bonine, MD;  Location: [facility];  Service: Urology;  Laterality: N/A;  . ELECTROPHYSIOLOGIC STUDY N/A 03/30/2015   Procedure: PVC Ablation;  Surgeon: [name], MD;  Location: [facility];  Service: Cardiovascular;  Laterality: N/A;  . HERNIA REPAIR    . SQUAMOUS CELL CARCINOMA EXCISION     "right hand; left shoulder" (12/13/2017)  . UMBILICAL HERNIA REPAIR  11-04-2005     Current Outpatient Medications  Medication Sig Dispense Refill  . Cholecalciferol (VITAMIN D3) 5000 units CAPS Take 5,000 Units by mouth daily.     [name] desonide (DESOWEN) 0.05 % cream Apply 1 application topically 2 (two) times daily as needed (psoriasis).    . Fluticasone-Salmeterol (WIXELA INHUB) 100-50 MCG/DOSE AEPB Inhale 1 puff into the lungs 2 (two) times daily as needed (for respiratory issues.).     [name] imiquimod (ALDARA) 5 % cream Apply 1 application topically every Monday, Wednesday, and Friday.     [name] LANTUS SOLOSTAR 100 UNIT/ML Solostar Pen Inject 28 Units into the skin at bedtime.   0  . losartan (COZAAR) 50 MG tablet Take 50 mg by mouth daily.  3  . metFORMIN (GLUCOPHAGE) 1000 MG tablet Take 1,000 mg by mouth 2 (two) times daily.  0  . metoprolol succinate (TOPROL-XL) 100 MG 24 hr tablet Take 1 tablet (100 mg total) by mouth daily. Take with or immediately following a meal. 90 tablet 3  . omeprazole (PRILOSEC) 20 MG capsule Take 20 mg by mouth every evening.    . pimecrolimus (ELIDEL) 1 % cream Apply 1 application topically 2 (two) times daily as needed (psoriasis).     . probenecid (BENEMID) 500 MG tablet Take 500 mg by mouth daily.     . simvastatin (ZOCOR) 40 MG tablet Take 40 mg by mouth every evening.     . tamsulosin (FLOMAX) 0.4 MG CAPS capsule Take 0.4 mg by mouth every evening.   4  . vitamin B-12 (CYANOCOBALAMIN) 1000 MCG tablet Take 1,000  mcg by mouth daily.    Alveda Reasons 20 MG TABS tablet TAKE 1 TABLET (20 MG TOTAL) BY MOUTH DAILY WITH SUPPER. 30 tablet 11   No current facility-administered medications for this encounter.     Allergies:   Indomethacin   Social History:  The patient  reports that he has never smoked. He has never used smokeless tobacco. He reports that he does not drink alcohol or use drugs.   Family History:  The patient's  family history includes CVA in his father; Coronary artery disease in his mother.    ROS:  Please see the history of present illness.   All other systems are personally reviewed and negative.   Exam: Well appearing, alert and conversant, regular work of breathing,  good skin color  Recent Labs: 06/26/2018: BUN 15; Creatinine, Ser 1.19; Hemoglobin 14.4;  Platelets 350; Potassium 5.2; Sodium 140  personally reviewed    Other studies personally reviewed: Additional studies/ records that were reviewed today include: Epic notes   The patient presents wearable device technology report for my review today. On my review, the patient presents Apple Watch tracings from today. The tracings reveal rate controlled afib.     ASSESSMENT AND PLAN:  1.  Persistent atrial fibrillation S/p afib ablation 11/2017 and flutter ablation 01/2018 with Dr Curt Bears. S/p DCCV 06/29/18. Patient in persistent afib since early April, mildly symptomatic with arrhythmia. We discussed therapeutic options including resuming flecainide, starting amiodarone, or rate control. Patient would like to continue rhythm control strategy with flecainide.  Start flecainide 50 mg BID. Normal stress test on flecainide 2017 noted. Continue Toprol 100 mg daily Continue Xarelto 20 mg daily Lifestyle changes as below.  This patients CHA2DS2-VASc Score and unadjusted Ischemic Stroke Rate (% per year) is equal to 3.2 % stroke rate/year from a score of 3  Above score calculated as 1 point each if present [CHF, HTN, DM,  Vascular=MI/PAD/Aortic Plaque, Age if 65-74, or Male] Above score calculated as 2 points each if present [Age > 75, or Stroke/TIA/TE]  2. HTN Stable, no changes today  3. Obesity Body mass index is 40.61 kg/m. Lifestyle modification was discussed and encouraged including regular physical activity and weight reduction. DASH diet recommended in addition to diabetic diet. Importance of good glycemic control for long term rhythm control discussed.    COVID screen The patient does not have any symptoms that suggest any further testing/ screening at this time.  Social distancing reinforced today.    Follow-up in AF clinic for ECG in one week.   Current medicines are reviewed at length with the patient today.   The patient does not have concerns regarding his medicines.  The following changes were made today:  Start flecainide  Labs/ tests ordered today include:  No orders of the defined types were placed in this encounter.   Patient Risk:  after full review of this patients clinical status, I feel that they are at moderate risk at this time.   Today, I have spent 23 minutes with the patient with telehealth technology discussing atrial fibrillation, flecainide, lifestyle changes, and COVID-19 precautions.    Gwenlyn Perking PA-C 08/28/2018 11:56 AM  Af[facility] [address] [place],  [address] [phone]

## 2018-09-06 ENCOUNTER — Encounter (HOSPITAL_COMMUNITY): Payer: Self-pay | Admitting: Physician Assistant

## 2018-09-06 ENCOUNTER — Other Ambulatory Visit: Payer: Self-pay

## 2018-09-06 ENCOUNTER — Ambulatory Visit (HOSPITAL_COMMUNITY)
Admission: RE | Admit: 2018-09-06 | Discharge: 2018-09-06 | Disposition: A | Discharge status: Home / Self Care | Payer: Medicare Other | Source: Ambulatory Visit | Attending: Physician Assistant | Admitting: Physician Assistant

## 2018-09-06 VITALS — BP 140/72 | HR 64 | Ht 69.0 in

## 2018-09-06 DIAGNOSIS — Z8249 Family history of ischemic heart disease and other diseases of the circulatory system: Secondary | ICD-10-CM | POA: Diagnosis not present

## 2018-09-06 DIAGNOSIS — M109 Gout, unspecified: Secondary | ICD-10-CM | POA: Diagnosis not present

## 2018-09-06 DIAGNOSIS — I10 Essential (primary) hypertension: Secondary | ICD-10-CM | POA: Insufficient documentation

## 2018-09-06 DIAGNOSIS — I4819 Other persistent atrial fibrillation: Secondary | ICD-10-CM | POA: Diagnosis present

## 2018-09-06 DIAGNOSIS — M19049 Primary osteoarthritis, unspecified hand: Secondary | ICD-10-CM | POA: Diagnosis not present

## 2018-09-06 DIAGNOSIS — Z7901 Long term (current) use of anticoagulants: Secondary | ICD-10-CM | POA: Insufficient documentation

## 2018-09-06 DIAGNOSIS — Z7984 Long term (current) use of oral hypoglycemic drugs: Secondary | ICD-10-CM | POA: Diagnosis not present

## 2018-09-06 DIAGNOSIS — E118 Type 2 diabetes mellitus with unspecified complications: Secondary | ICD-10-CM | POA: Insufficient documentation

## 2018-09-06 DIAGNOSIS — J45909 Unspecified asthma, uncomplicated: Secondary | ICD-10-CM | POA: Insufficient documentation

## 2018-09-06 DIAGNOSIS — E785 Hyperlipidemia, unspecified: Secondary | ICD-10-CM | POA: Insufficient documentation

## 2018-09-06 DIAGNOSIS — Z6841 Body Mass Index (BMI) 40.0 and over, adult: Secondary | ICD-10-CM | POA: Diagnosis not present

## 2018-09-06 DIAGNOSIS — E669 Obesity, unspecified: Secondary | ICD-10-CM | POA: Diagnosis not present

## 2018-09-06 DIAGNOSIS — Z794 Long term (current) use of insulin: Secondary | ICD-10-CM | POA: Diagnosis not present

## 2018-09-06 DIAGNOSIS — R0602 Shortness of breath: Secondary | ICD-10-CM | POA: Insufficient documentation

## 2018-09-06 MED ORDER — METOPROLOL SUCCINATE ER 100 MG PO TB24: 50.0000 mg | ORAL_TABLET | Freq: Every day | ORAL | 3 refills | Status: DC

## 2018-09-06 MED ORDER — FLECAINIDE ACETATE 50 MG PO TABS: 100.0000 mg | ORAL_TABLET | Freq: Two times a day (BID) | ORAL | 3 refills | Status: DC

## 2018-09-06 NOTE — Patient Instructions (Signed)
Increase flecainide to 100 mg twice a day; taking 2 tablets of the 50 mg you have on hand twice a day  Decrease your metoprolol to 50 mg daily  Call if you have any questions

## 2018-09-06 NOTE — Progress Notes (Signed)
Primary Care Physician: Kathyrn Lass, MD Primary Cardiologist: Dr Marlou Porch Primary Electrophysiologist: Dr Curt Bears Referring Physician: Dr Melanee Left is a 73 y.o. male with a history of persistent atrial fibrillation who presents for follow up in the [facility]. Patient reports doing reasonably well since his last visit. He states that his SOB has persisted since starting flecainide. Of note, he did have SR noted on his Apple Watch two days in the past week. He did not feel much different in rhythm. He does still try to walk on a regular basis. No symptoms of palpitations or heart racing. He remains in afib today.  Today, he denies symptoms of palpitations, chest pain, orthopnea, PND, lower extremity edema, dizziness, presyncope, syncope, snoring, daytime somnolence, bleeding, or neurologic sequela. The patient is tolerating medications without difficulties and is otherwise without complaint today.    Atrial Fibrillation Risk Factors:  he does not have symptoms or diagnosis of sleep apnea. he does not have a history of rheumatic fever. he does not have a history of alcohol use.  he has a BMI of Body mass index is 40.61 kg/m.[name] There were no vitals filed for this visit.  Family History  Problem Relation Age of Onset  . Coronary artery disease Mother   . CVA Father      Atrial Fibrillation Management history:  Previous antiarrhythmic drugs: flecainide Previous ablations: 11/2017, 01/2018 CHADS2VASC score: 3 Anticoagulation history: Xarelto   Past Medical History:  Diagnosis Date  . Arthritis    "hands, feet" (12/13/2017)  . Asthma   . Bradycardia    a. Repoted h/o HR in the 40s.  . Edema    a. 2D echo 11/2013: EF 39-03%, LV diastolic function parameters were normal, severely dilated LA, PASP 46mmHg..  . H/O: gout    right foot; on daily RX" (12/13/2017)  . HTN (hypertension)   . Hyperlipidemia   . Myalgia and myositis,  unspecified   . Nephrotic syndrome    a. responder to steroid therapy by Dr. Lorrene Reid.   . Obesity   . Orthostatic hypotension   . Penile cancer ([place])    "did circ"  . Pulmonary hypertension ([place])    a. Mild pulm HTN felt 2/2 obesity.  [name] PVC's (premature ventricular contractions)    a. Holter 05/2013 showing 18k PVCs (18% of the time).  . Seasonal asthma    mild  . Shoulder pain   . Squamous carcinoma    "right hand; left shoulder" (12/13/2017)  . Type 2 diabetes mellitus ([place])    Past Surgical History:  Procedure Laterality Date  . A-FLUTTER ABLATION N/A 02/23/2018   Procedure: A-FLUTTER ABLATION;  Surgeon: Constance Haw, MD;  Location: [place] [facility];  Service: Cardiovascular;  Laterality: N/A;  . ATRIAL FIBRILLATION ABLATION N/A 12/13/2017   Procedure: ATRIAL FIBRILLATION ABLATION;  Surgeon: Constance Haw, MD;  Location: [place] [facility] LAB;  Service: Cardiovascular;  Laterality: N/A;  . BUNIONECTOMY WITH HAMMERTOE RECONSTRUCTION Left 1992  &  2008   REMOVAL HEAL SPUR/ BUNIONECTOMY AND HAMMERTOE REPAIR   . CARDIOVERSION N/A 07/22/2015   Procedure: CARDIOVERSION;  Surgeon: Dorothy Spark, MD;  Location: [place];  Service: Cardiovascular;  Laterality: N/A;  . CARDIOVERSION N/A 10/06/2015   Procedure: CARDIOVERSION;  Surgeon: Sueanne Margarita, MD;  Location: [facility] ENDOSCOPY;  Service: Cardiovascular;  Laterality: N/A;  . CARDIOVERSION N/A 12/05/2016   Procedure: CARDIOVERSION;  Surgeon: Pixie Casino, MD;  Location: [facility]  ENDOSCOPY;  Service: Cardiovascular;  Laterality: N/A;  . CARDIOVERSION N/A 06/29/2018   Procedure: CARDIOVERSION;  Surgeon: Thayer Headings, MD;  Location: [place];  Service: Cardiovascular;  Laterality: N/A;  . CIRCUMCISION N/A 11/27/2012   Procedure:  CIRCUMCISION  AND EXCISION OF GLANS PENIS;  Surgeon: Fredricka Bonine, MD;  Location: [facility];  Service: Urology;  Laterality: N/A;  . ELECTROPHYSIOLOGIC STUDY N/A  03/30/2015   Procedure: PVC Ablation;  Surgeon: [name], MD;  Location: [place] CV LAB;  Service: Cardiovascular;  Laterality: N/A;  . HERNIA REPAIR    . SQUAMOUS CELL CARCINOMA EXCISION     "right hand; left shoulder" (12/13/2017)  . UMBILICAL HERNIA REPAIR  11-04-2005    Current Outpatient Medications  Medication Sig Dispense Refill  . Cholecalciferol (VITAMIN D3) 5000 units CAPS Take 5,000 Units by mouth daily.     [name] desonide (DESOWEN) 0.05 % cream Apply 1 application topically 2 (two) times daily as needed (psoriasis).    . flecainide (TAMBOCOR) 50 MG tablet Take 2 tablets (100 mg total) by mouth 2 (two) times daily. 60 tablet 3  . Fluticasone-Salmeterol (WIXELA INHUB) 100-50 MCG/DOSE AEPB Inhale 1 puff into the lungs 2 (two) times daily as needed (for respiratory issues.).     [name] imiquimod (ALDARA) 5 % cream Apply 1 application topically every Monday, Wednesday, and Friday.     [name] LANTUS SOLOSTAR 100 UNIT/ML Solostar Pen Inject 28 Units into the skin at bedtime.   0  . losartan (COZAAR) 50 MG tablet Take 50 mg by mouth daily.  3  . metFORMIN (GLUCOPHAGE) 1000 MG tablet Take 1,000 mg by mouth 2 (two) times daily.  0  . metoprolol succinate (TOPROL-XL) 100 MG 24 hr tablet Take 1 tablet (100 mg total) by mouth daily. Take with or immediately following a meal. 90 tablet 3  . omeprazole (PRILOSEC) 20 MG capsule Take 20 mg by mouth every evening.    . pimecrolimus (ELIDEL) 1 % cream Apply 1 application topically 2 (two) times daily as needed (psoriasis).     . probenecid (BENEMID) 500 MG tablet Take 500 mg by mouth daily.     . simvastatin (ZOCOR) 40 MG tablet Take 40 mg by mouth every evening.     . tamsulosin (FLOMAX) 0.4 MG CAPS capsule Take 0.4 mg by mouth every evening.   4  . vitamin B-12 (CYANOCOBALAMIN) 1000 MCG tablet Take 1,000 mcg by mouth daily.    Alveda Reasons 20 MG TABS tablet TAKE 1 TABLET (20 MG TOTAL) BY MOUTH DAILY WITH SUPPER. 30 tablet 11   No current  facility-administered medications for this encounter.     Allergies  Allergen Reactions  . Indomethacin Other (See Comments)    dizziness    Social History   Socioeconomic History  . Marital status: Widowed    Spouse name: Not on file  . Number of children: Not on file  . Years of education: Not on file  . Highest education level: Not on file  Occupational History  . Not on file  Social Needs  . Financial resource strain: Not on file  . Food insecurity:    Worry: Not on file    Inability: Not on file  . Transportation needs:    Medical: Not on file    Non-medical: Not on file  Tobacco Use  . Smoking status: Never Smoker  . Smokeless tobacco: Never Used  Substance and Sexual Activity  . Alcohol use: Never  Frequency: Never  . Drug use: Never  . Sexual activity: Not Currently  Lifestyle  . Physical activity:    Days per week: Not on file    Minutes per session: Not on file  . Stress: Not on file  Relationships  . Social connections:    Talks on phone: Not on file    Gets together: Not on file    Attends religious service: Not on file    Active member of club or organization: Not on file    Attends meetings of clubs or organizations: Not on file    Relationship status: Not on file  . Intimate partner violence:    Fear of current or ex partner: Not on file    Emotionally abused: Not on file    Physically abused: Not on file    Forced sexual activity: Not on file  Other Topics Concern  . Not on file  Social History Narrative  . Not on file     ROS- All systems are reviewed and negative except as per the HPI above.  Physical Exam: Vitals:   09/06/18 1100  BP: 140/72  Pulse: 64  Height: 5\' 9"  (1.753 m)    GEN- The patient is well appearing obese male, alert and oriented x 3 today.   Head- normocephalic, atraumatic Eyes-  Sclera clear, conjunctiva pink Ears- hearing intact Oropharynx- clear Neck- supple  Lungs- Clear to ausculation bilaterally,  normal work of breathing Heart- irregular rate and rhythm, no murmurs, rubs or gallops  GI- soft, NT, ND, + BS Extremities- no clubbing, cyanosis, or edema MS- no significant deformity or atrophy Skin- no rash or lesion Psych- euthymic mood, full affect Neuro- strength and sensation are intact  Wt Readings from Last 3 Encounters:  08/28/18 124.7 kg  06/29/18 125.6 kg  06/26/18 125.6 kg    EKG today demonstrates afib HR 64, slow R wave progression, QRS 98, QTc 416  Echo 11/2013 demonstrated  - Left ventricle: The cavity size was normal. There was mild concentric hypertrophy. Systolic function was normal. The estimated ejection fraction was in the range of 55% to 60%. Although no diagnostic regional wall motion abnormality was identified, this possibility cannot be completely excluded on the basis of this study. Left ventricular diastolic function parameters were normal. - Left atrium: The atrium was severely dilated. - Pulmonary arteries: Systolic pressure was mildly increased. PA peak pressure: 38 mm Hg (S).  Epic records are reviewed at length today  Assessment and Plan:  1. Persistent atrial fibrillation The patient has persistent atrial fibrillation but may now be paroxysmal with SR noted on Apple Watch. Discussed therapeutic options including increasing flecainide, switching to amiodarone, or rate control. At this time, patient prefers to increase flecainide.  Increase flecainide to 100 mg BID. May need repeat DCCV on flecainide if he wants to continue to pursue rhythm strategy.  Severely dilated LA noted on previous echo. SR may be difficult to maintain long term. Decrease metoprolol to 50 mg daily for intermittent bradycardia. Continue Xarelto 20 mg daily.  This patients CHA2DS2-VASc Score and unadjusted Ischemic Stroke Rate (% per year) is equal to 3.2 % stroke rate/year from a score of 3  Above score calculated as 1 point each if present [CHF, HTN, DM,  Vascular=MI/PAD/Aortic Plaque, Age if 65-74, or Male] Above score calculated as 2 points each if present [Age > 75, or Stroke/TIA/TE]   2. Obesity Body mass index is 40.61 kg/m. Lifestyle modification was discussed at length including regular  exercise and weight reduction.  3. HTN Stable, med changes as above.   Follow up in AF clinic in one week for ECG.  [facility] [address] [place], [place] [address] [phone] 09/06/2018 11:31 AM

## 2018-09-13 ENCOUNTER — Ambulatory Visit (HOSPITAL_COMMUNITY)
Admission: RE | Admit: 2018-09-13 | Discharge: 2018-09-13 | Disposition: A | Discharge status: Home / Self Care | Payer: Medicare Other | Source: Ambulatory Visit | Attending: Nurse Practitioner | Admitting: Nurse Practitioner

## 2018-09-13 ENCOUNTER — Other Ambulatory Visit: Payer: Self-pay

## 2018-09-13 ENCOUNTER — Encounter (HOSPITAL_COMMUNITY): Payer: Self-pay | Admitting: Nurse Practitioner

## 2018-09-13 VITALS — BP 142/86 | HR 61

## 2018-09-13 DIAGNOSIS — Z823 Family history of stroke: Secondary | ICD-10-CM | POA: Diagnosis not present

## 2018-09-13 DIAGNOSIS — J45909 Unspecified asthma, uncomplicated: Secondary | ICD-10-CM | POA: Diagnosis not present

## 2018-09-13 DIAGNOSIS — I4819 Other persistent atrial fibrillation: Secondary | ICD-10-CM | POA: Diagnosis present

## 2018-09-13 DIAGNOSIS — Z85828 Personal history of other malignant neoplasm of skin: Secondary | ICD-10-CM | POA: Diagnosis not present

## 2018-09-13 DIAGNOSIS — M1990 Unspecified osteoarthritis, unspecified site: Secondary | ICD-10-CM | POA: Diagnosis not present

## 2018-09-13 DIAGNOSIS — E669 Obesity, unspecified: Secondary | ICD-10-CM | POA: Diagnosis not present

## 2018-09-13 DIAGNOSIS — Z8249 Family history of ischemic heart disease and other diseases of the circulatory system: Secondary | ICD-10-CM | POA: Insufficient documentation

## 2018-09-13 DIAGNOSIS — I10 Essential (primary) hypertension: Secondary | ICD-10-CM | POA: Diagnosis not present

## 2018-09-13 DIAGNOSIS — Z794 Long term (current) use of insulin: Secondary | ICD-10-CM | POA: Diagnosis not present

## 2018-09-13 DIAGNOSIS — Z886 Allergy status to analgesic agent status: Secondary | ICD-10-CM | POA: Insufficient documentation

## 2018-09-13 DIAGNOSIS — Z7901 Long term (current) use of anticoagulants: Secondary | ICD-10-CM | POA: Insufficient documentation

## 2018-09-13 DIAGNOSIS — E119 Type 2 diabetes mellitus without complications: Secondary | ICD-10-CM | POA: Diagnosis not present

## 2018-09-13 DIAGNOSIS — I4891 Unspecified atrial fibrillation: Secondary | ICD-10-CM | POA: Insufficient documentation

## 2018-09-13 DIAGNOSIS — Z79899 Other long term (current) drug therapy: Secondary | ICD-10-CM | POA: Diagnosis not present

## 2018-09-13 DIAGNOSIS — E785 Hyperlipidemia, unspecified: Secondary | ICD-10-CM | POA: Diagnosis not present

## 2018-09-13 LAB — CBC
HCT: 37.5 % — ABNORMAL LOW (ref 39.0–52.0)
Hemoglobin: 12.1 g/dL — ABNORMAL LOW (ref 13.0–17.0)
MCH: 30.4 pg (ref 26.0–34.0)
MCHC: 32.3 g/dL (ref 30.0–36.0)
MCV: 94.2 fL (ref 80.0–100.0)
Platelets: 252 10*3/uL (ref 150–400)
RBC: 3.98 MIL/uL — ABNORMAL LOW (ref 4.22–5.81)
RDW: 14.6 % (ref 11.5–15.5)
WBC: 7.4 10*3/uL (ref 4.0–10.5)
nRBC: 0 % (ref 0.0–0.2)

## 2018-09-13 LAB — BASIC METABOLIC PANEL
Anion gap: 9 (ref 5–15)
BUN: 17 mg/dL (ref 8–23)
CO2: 28 mmol/L (ref 22–32)
Calcium: 9.1 mg/dL (ref 8.9–10.3)
Chloride: 102 mmol/L (ref 98–111)
Creatinine, Ser: 1.58 mg/dL — ABNORMAL HIGH (ref 0.61–1.24)
GFR calc Af Amer: 50 mL/min — ABNORMAL LOW (ref >60)
GFR calc non Af Amer: 43 mL/min — ABNORMAL LOW (ref >60)
Glucose, Bld: 117 mg/dL — ABNORMAL HIGH (ref 70–99)
Potassium: 5.1 mmol/L (ref 3.5–5.1)
Sodium: 139 mmol/L (ref 135–145)

## 2018-09-13 MED ORDER — METOPROLOL SUCCINATE ER 100 MG PO TB24: ORAL_TABLET | ORAL | 3 refills | Status: DC

## 2018-09-13 NOTE — H&P (View-Only) (Signed)
Primary Care Physician: Kathyrn Lass, MD Referring Physician: Dr. Fabio Bering, PA   Mark Singh is a 73 y.o. male with a h/o afib s/p ablation x2 and previous cardioversion's x 4,with persistent afib. He was recently started on flecainide and is in for repeat EKG. He remains in afib at 64 bpm despite 100 mg flecainide bid.Cardiovesion was  discussed and he wants to pursue. No  missed anticoagulation. His BB was reduced to 50 mg daily and this has helped with his feeling of shortness of breath.  Today, he denies symptoms of palpitations, chest pain, , orthopnea, PND, lower extremity edema, dizziness, presyncope, syncope, or neurologic sequela.+ for shortness of breath. The patient is tolerating medications without difficulties and is otherwise without complaint today.   Past Medical History:  Diagnosis Date  . Arthritis    "hands, feet" (12/13/2017)  . Asthma   . Bradycardia    a. Repoted h/o HR in the 40s.  . Edema    a. 2D echo 11/2013: EF 13-24%, LV diastolic function parameters were normal, severely dilated LA, PASP 58mmHg..  . H/O: gout    right foot; on daily RX" (12/13/2017)  . HTN (hypertension)   . Hyperlipidemia   . Myalgia and myositis, unspecified   . Nephrotic syndrome    a. responder to steroid therapy by Dr. Lorrene Reid.   . Obesity   . Orthostatic hypotension   . Penile cancer ([place])    "did circ"  . Pulmonary hypertension ([place])    a. Mild pulm HTN felt 2/2 obesity.  [name] PVC's (premature ventricular contractions)    a. Holter 05/2013 showing 18k PVCs (18% of the time).  . Seasonal asthma    mild  . Shoulder pain   . Squamous carcinoma    "right hand; left shoulder" (12/13/2017)  . Type 2 diabetes mellitus ([place])    Past Surgical History:  Procedure Laterality Date  . A-FLUTTER ABLATION N/A 02/23/2018   Procedure: A-FLUTTER ABLATION;  Surgeon: Constance Haw, MD;  Location: [place] [facility] LAB;  Service: Cardiovascular;  Laterality: N/A;  .  ATRIAL FIBRILLATION ABLATION N/A 12/13/2017   Procedure: ATRIAL FIBRILLATION ABLATION;  Surgeon: Constance Haw, MD;  Location: [place] CV LAB;  Service: Cardiovascular;  Laterality: N/A;  . BUNIONECTOMY WITH HAMMERTOE RECONSTRUCTION Left 1992  &  2008   REMOVAL HEAL SPUR/ BUNIONECTOMY AND HAMMERTOE REPAIR   . CARDIOVERSION N/A 07/22/2015   Procedure: CARDIOVERSION;  Surgeon: Dorothy Spark, MD;  Location: [place];  Service: Cardiovascular;  Laterality: N/A;  . CARDIOVERSION N/A 10/06/2015   Procedure: CARDIOVERSION;  Surgeon: Sueanne Margarita, MD;  Location: [facility] ENDOSCOPY;  Service: Cardiovascular;  Laterality: N/A;  . CARDIOVERSION N/A 12/05/2016   Procedure: CARDIOVERSION;  Surgeon: Pixie Casino, MD;  Location: [facility] ENDOSCOPY;  Service: Cardiovascular;  Laterality: N/A;  . CARDIOVERSION N/A 06/29/2018   Procedure: CARDIOVERSION;  Surgeon: Thayer Headings, MD;  Location: [facility] ENDOSCOPY;  Service: Cardiovascular;  Laterality: N/A;  . CIRCUMCISION N/A 11/27/2012   Procedure:  CIRCUMCISION  AND EXCISION OF GLANS PENIS;  Surgeon: Fredricka Bonine, MD;  Location: [facility];  Service: Urology;  Laterality: N/A;  . ELECTROPHYSIOLOGIC STUDY N/A 03/30/2015   Procedure: PVC Ablation;  Surgeon: [name], MD;  Location: [place] CV LAB;  Service: Cardiovascular;  Laterality: N/A;  . HERNIA REPAIR    . SQUAMOUS CELL CARCINOMA EXCISION     "right hand; left shoulder" (12/13/2017)  . UMBILICAL HERNIA REPAIR  11-04-2005    Current Outpatient Medications  Medication Sig Dispense Refill  . flecainide (TAMBOCOR) 50 MG tablet Take 2 tablets (100 mg total) by mouth 2 (two) times daily. 60 tablet 3  . Cholecalciferol (VITAMIN D3) 5000 units CAPS Take 5,000 Units by mouth daily.     [name] desonide (DESOWEN) 0.05 % cream Apply 1 application topically 2 (two) times daily as needed (psoriasis).    . Fluticasone-Salmeterol (WIXELA INHUB) 100-50 MCG/DOSE AEPB Inhale 1 puff  into the lungs 2 (two) times daily as needed (for respiratory issues.).     [name] imiquimod (ALDARA) 5 % cream Apply 1 application topically every Monday, Wednesday, and Friday.     [name] LANTUS SOLOSTAR 100 UNIT/ML Solostar Pen Inject 28 Units into the skin at bedtime.   0  . losartan (COZAAR) 50 MG tablet Take 50 mg by mouth daily.  3  . metFORMIN (GLUCOPHAGE) 1000 MG tablet Take 1,000 mg by mouth 2 (two) times daily.  0  . metoprolol succinate (TOPROL-XL) 100 MG 24 hr tablet Take 1/2 tablet a dayTake with or immediately following a meal. 90 tablet 3  . omeprazole (PRILOSEC) 20 MG capsule Take 20 mg by mouth every evening.    . pimecrolimus (ELIDEL) 1 % cream Apply 1 application topically 2 (two) times daily as needed (psoriasis).     . probenecid (BENEMID) 500 MG tablet Take 500 mg by mouth daily.     . simvastatin (ZOCOR) 40 MG tablet Take 40 mg by mouth every evening.     . tamsulosin (FLOMAX) 0.4 MG CAPS capsule Take 0.4 mg by mouth every evening.   4  . vitamin B-12 (CYANOCOBALAMIN) 1000 MCG tablet Take 1,000 mcg by mouth daily.    Alveda Reasons 20 MG TABS tablet TAKE 1 TABLET (20 MG TOTAL) BY MOUTH DAILY WITH SUPPER. 30 tablet 11   No current facility-administered medications for this encounter.     Allergies  Allergen Reactions  . Indomethacin Other (See Comments)    dizziness    Social History   Socioeconomic History  . Marital status: Widowed    Spouse name: Not on file  . Number of children: Not on file  . Years of education: Not on file  . Highest education level: Not on file  Occupational History  . Not on file  Social Needs  . Financial resource strain: Not on file  . Food insecurity:    Worry: Not on file    Inability: Not on file  . Transportation needs:    Medical: Not on file    Non-medical: Not on file  Tobacco Use  . Smoking status: Never Smoker  . Smokeless tobacco: Never Used  Substance and Sexual Activity  . Alcohol use: Never    Frequency: Never  . Drug  use: Never  . Sexual activity: Not Currently  Lifestyle  . Physical activity:    Days per week: Not on file    Minutes per session: Not on file  . Stress: Not on file  Relationships  . Social connections:    Talks on phone: Not on file    Gets together: Not on file    Attends religious service: Not on file    Active member of club or organization: Not on file    Attends meetings of clubs or organizations: Not on file    Relationship status: Not on file  . Intimate partner violence:    Fear of current or ex partner: Not on file  Emotionally abused: Not on file    Physically abused: Not on file    Forced sexual activity: Not on file  Other Topics Concern  . Not on file  Social History Narrative  . Not on file    Family History  Problem Relation Age of Onset  . Coronary artery disease Mother   . CVA Father     ROS- All systems are reviewed and negative except as per the HPI above  Physical Exam: Vitals:   09/13/18 1517  BP: (!) 142/86  Pulse: 61   Wt Readings from Last 3 Encounters:  08/28/18 124.7 kg  06/29/18 125.6 kg  06/26/18 125.6 kg    Labs: Lab Results  Component Value Date   NA 140 06/26/2018   K 5.2 06/26/2018   CL 99 06/26/2018   CO2 24 06/26/2018   GLUCOSE 172 (H) 06/26/2018   BUN 15 06/26/2018   CREATININE 1.19 06/26/2018   CALCIUM 9.9 06/26/2018   MG 1.9 02/03/2015   Lab Results  Component Value Date   INR 1.0 05/16/2008   No results found for: CHOL, HDL, LDLCALC, TRIG   GEN- The patient is well appearing, alert and oriented x 3 today.   Head- normocephalic, atraumatic Eyes-  Sclera clear, conjunctiva pink Ears- hearing intact Oropharynx- clear Neck- supple, no JVP Lymph- no cervical lymphadenopathy Lungs- Clear to ausculation bilaterally, normal work of breathing Heart- slow irregular rate and rhythm, no murmurs, rubs or gallops, PMI not laterally displaced GI- soft, NT, ND, + BS Extremities- no clubbing, cyanosis, or edema MS-  no significant deformity or atrophy Skin- no rash or lesion Psych- euthymic mood, full affect Neuro- strength and sensation are intact  EKG-afib at 64 bpm, qrs int 100 ms, qtc 429 ms Epic records reviewed    Assessment and Plan: 1. Persistent afib Has been started on flecainide and is now at 100 mg bid Unfortunately, he is still in afib Will be scheduled for cardioversion Explained that he will require covid test prior to procedure He has not missed any doses of anticoagulation  Bmet/cbc today Continue  xarelto 20 mg daily  F/u in afib clinic in 1-2 weeks  Butch Penny C. Ameliarose Shark, [facility] [address] [place], [place] [address] [phone]

## 2018-09-13 NOTE — Patient Instructions (Signed)
Cardioversion scheduled for Thursday, May 21st  - Arrive at the [facility] and go to admitting at 1030AM  -Do not eat or drink anything after midnight the night prior to your procedure.  - Take all your medication (except diabetes medications) with a sip of water prior to arrival.  - You will not be able to drive home after your procedure.  Your driver will stay in their car on hospital premises throughout your procedure.

## 2018-09-13 NOTE — Progress Notes (Signed)
Primary Care Physician: Kathyrn Lass, MD Referring Physician: Dr. Fabio Bering, PA   Mark Singh is a 73 y.o. male with a h/o afib s/p ablation x2 and previous cardioversion's x 4,with persistent afib. He was recently started on flecainide and is in for repeat EKG. He remains in afib at 64 bpm despite 100 mg flecainide bid.Cardiovesion was  discussed and he wants to pursue. No  missed anticoagulation. His BB was reduced to 50 mg daily and this has helped with his feeling of shortness of breath.  Today, he denies symptoms of palpitations, chest pain, , orthopnea, PND, lower extremity edema, dizziness, presyncope, syncope, or neurologic sequela.+ for shortness of breath. The patient is tolerating medications without difficulties and is otherwise without complaint today.   Past Medical History:  Diagnosis Date  . Arthritis    "hands, feet" (12/13/2017)  . Asthma   . Bradycardia    a. Repoted h/o HR in the 40s.  . Edema    a. 2D echo 11/2013: EF 67-67%, LV diastolic function parameters were normal, severely dilated LA, PASP 37mmHg..  . H/O: gout    right foot; on daily RX" (12/13/2017)  . HTN (hypertension)   . Hyperlipidemia   . Myalgia and myositis, unspecified   . Nephrotic syndrome    a. responder to steroid therapy by Dr. Lorrene Reid.   . Obesity   . Orthostatic hypotension   . Penile cancer ([place])    "did circ"  . Pulmonary hypertension ([place])    a. Mild pulm HTN felt 2/2 obesity.  [name] PVC's (premature ventricular contractions)    a. Holter 05/2013 showing 18k PVCs (18% of the time).  . Seasonal asthma    mild  . Shoulder pain   . Squamous carcinoma    "right hand; left shoulder" (12/13/2017)  . Type 2 diabetes mellitus ([place])    Past Surgical History:  Procedure Laterality Date  . A-FLUTTER ABLATION N/A 02/23/2018   Procedure: A-FLUTTER ABLATION;  Surgeon: Constance Haw, MD;  Location: [facility];  Service: Cardiovascular;  Laterality: N/A;  .  ATRIAL FIBRILLATION ABLATION N/A 12/13/2017   Procedure: ATRIAL FIBRILLATION ABLATION;  Surgeon: Constance Haw, MD;  Location: [facility];  Service: Cardiovascular;  Laterality: N/A;  . BUNIONECTOMY WITH HAMMERTOE RECONSTRUCTION Left 1992  &  2008   REMOVAL HEAL SPUR/ BUNIONECTOMY AND HAMMERTOE REPAIR   . CARDIOVERSION N/A 07/22/2015   Procedure: CARDIOVERSION;  Surgeon: Dorothy Spark, MD;  Location: [facility];  Service: Cardiovascular;  Laterality: N/A;  . CARDIOVERSION N/A 10/06/2015   Procedure: CARDIOVERSION;  Surgeon: Sueanne Margarita, MD;  Location: [facility] ENDOSCOPY;  Service: Cardiovascular;  Laterality: N/A;  . CARDIOVERSION N/A 12/05/2016   Procedure: CARDIOVERSION;  Surgeon: Pixie Casino, MD;  Location: [facility] ENDOSCOPY;  Service: Cardiovascular;  Laterality: N/A;  . CARDIOVERSION N/A 06/29/2018   Procedure: CARDIOVERSION;  Surgeon: Thayer Headings, MD;  Location: [facility] ENDOSCOPY;  Service: Cardiovascular;  Laterality: N/A;  . CIRCUMCISION N/A 11/27/2012   Procedure:  CIRCUMCISION  AND EXCISION OF GLANS PENIS;  Surgeon: Fredricka Bonine, MD;  Location: [facility];  Service: Urology;  Laterality: N/A;  . ELECTROPHYSIOLOGIC STUDY N/A 03/30/2015   Procedure: PVC Ablation;  Surgeon: [name], MD;  Location: [facility] [place] [facility];  Service: Cardiovascular;  Laterality: N/A;  . HERNIA REPAIR    . SQUAMOUS CELL CARCINOMA EXCISION     "right hand; left shoulder" (12/13/2017)  . UMBILICAL HERNIA REPAIR  11-04-2005    Current Outpatient Medications  Medication Sig Dispense Refill  . flecainide (TAMBOCOR) 50 MG tablet Take 2 tablets (100 mg total) by mouth 2 (two) times daily. 60 tablet 3  . Cholecalciferol (VITAMIN D3) 5000 units CAPS Take 5,000 Units by mouth daily.     [name] desonide (DESOWEN) 0.05 % cream Apply 1 application topically 2 (two) times daily as needed (psoriasis).    . Fluticasone-Salmeterol (WIXELA INHUB) 100-50 MCG/DOSE AEPB Inhale 1 puff  into the lungs 2 (two) times daily as needed (for respiratory issues.).     [name] imiquimod (ALDARA) 5 % cream Apply 1 application topically every Monday, Wednesday, and Friday.     [name] LANTUS SOLOSTAR 100 UNIT/ML Solostar Pen Inject 28 Units into the skin at bedtime.   0  . losartan (COZAAR) 50 MG tablet Take 50 mg by mouth daily.  3  . metFORMIN (GLUCOPHAGE) 1000 MG tablet Take 1,000 mg by mouth 2 (two) times daily.  0  . metoprolol succinate (TOPROL-XL) 100 MG 24 hr tablet Take 1/2 tablet a dayTake with or immediately following a meal. 90 tablet 3  . omeprazole (PRILOSEC) 20 MG capsule Take 20 mg by mouth every evening.    . pimecrolimus (ELIDEL) 1 % cream Apply 1 application topically 2 (two) times daily as needed (psoriasis).     . probenecid (BENEMID) 500 MG tablet Take 500 mg by mouth daily.     . simvastatin (ZOCOR) 40 MG tablet Take 40 mg by mouth every evening.     . tamsulosin (FLOMAX) 0.4 MG CAPS capsule Take 0.4 mg by mouth every evening.   4  . vitamin B-12 (CYANOCOBALAMIN) 1000 MCG tablet Take 1,000 mcg by mouth daily.    Alveda Reasons 20 MG TABS tablet TAKE 1 TABLET (20 MG TOTAL) BY MOUTH DAILY WITH SUPPER. 30 tablet 11   No current facility-administered medications for this encounter.     Allergies  Allergen Reactions  . Indomethacin Other (See Comments)    dizziness    Social History   Socioeconomic History  . Marital status: Widowed    Spouse name: Not on file  . Number of children: Not on file  . Years of education: Not on file  . Highest education level: Not on file  Occupational History  . Not on file  Social Needs  . Financial resource strain: Not on file  . Food insecurity:    Worry: Not on file    Inability: Not on file  . Transportation needs:    Medical: Not on file    Non-medical: Not on file  Tobacco Use  . Smoking status: Never Smoker  . Smokeless tobacco: Never Used  Substance and Sexual Activity  . Alcohol use: Never    Frequency: Never  . Drug  use: Never  . Sexual activity: Not Currently  Lifestyle  . Physical activity:    Days per week: Not on file    Minutes per session: Not on file  . Stress: Not on file  Relationships  . Social connections:    Talks on phone: Not on file    Gets together: Not on file    Attends religious service: Not on file    Active member of club or organization: Not on file    Attends meetings of clubs or organizations: Not on file    Relationship status: Not on file  . Intimate partner violence:    Fear of current or ex partner: Not on file  Emotionally abused: Not on file    Physically abused: Not on file    Forced sexual activity: Not on file  Other Topics Concern  . Not on file  Social History Narrative  . Not on file    Family History  Problem Relation Age of Onset  . Coronary artery disease Mother   . CVA Father     ROS- All systems are reviewed and negative except as per the HPI above  Physical Exam: Vitals:   09/13/18 1517  BP: (!) 142/86  Pulse: 61   Wt Readings from Last 3 Encounters:  08/28/18 124.7 kg  06/29/18 125.6 kg  06/26/18 125.6 kg    Labs: Lab Results  Component Value Date   NA 140 06/26/2018   K 5.2 06/26/2018   CL 99 06/26/2018   CO2 24 06/26/2018   GLUCOSE 172 (H) 06/26/2018   BUN 15 06/26/2018   CREATININE 1.19 06/26/2018   CALCIUM 9.9 06/26/2018   MG 1.9 02/03/2015   Lab Results  Component Value Date   INR 1.0 05/16/2008   No results found for: CHOL, HDL, LDLCALC, TRIG   GEN- The patient is well appearing, alert and oriented x 3 today.   Head- normocephalic, atraumatic Eyes-  Sclera clear, conjunctiva pink Ears- hearing intact Oropharynx- clear Neck- supple, no JVP Lymph- no cervical lymphadenopathy Lungs- Clear to ausculation bilaterally, normal work of breathing Heart- slow irregular rate and rhythm, no murmurs, rubs or gallops, PMI not laterally displaced GI- soft, NT, ND, + BS Extremities- no clubbing, cyanosis, or edema MS-  no significant deformity or atrophy Skin- no rash or lesion Psych- euthymic mood, full affect Neuro- strength and sensation are intact  EKG-afib at 64 bpm, qrs int 100 ms, qtc 429 ms Epic records reviewed    Assessment and Plan: 1. Persistent afib Has been started on flecainide and is now at 100 mg bid Unfortunately, he is still in afib Will be scheduled for cardioversion Explained that he will require covid test prior to procedure He has not missed any doses of anticoagulation  Bmet/cbc today Continue  xarelto 20 mg daily  F/u in afib clinic in 1-2 weeks  Butch Penny C. Carroll, [facility] [address] [place], [place] [address] [phone]

## 2018-09-17 ENCOUNTER — Other Ambulatory Visit (HOSPITAL_COMMUNITY)
Admission: RE | Admit: 2018-09-17 | Discharge: 2018-09-17 | Disposition: A | Discharge status: Home / Self Care | Payer: Medicare Other | Source: Ambulatory Visit | Attending: Cardiovascular Disease | Admitting: Cardiovascular Disease

## 2018-09-17 DIAGNOSIS — Z01812 Encounter for preprocedural laboratory examination: Secondary | ICD-10-CM | POA: Insufficient documentation

## 2018-09-17 DIAGNOSIS — Z1159 Encounter for screening for other viral diseases: Secondary | ICD-10-CM | POA: Diagnosis not present

## 2018-09-18 LAB — NOVEL CORONAVIRUS, NAA (HOSP ORDER, SEND-OUT TO REF LAB; TAT 18-24 HRS): SARS-CoV-2, NAA: NOT DETECTED

## 2018-09-19 ENCOUNTER — Encounter (HOSPITAL_COMMUNITY): Payer: Self-pay | Admitting: *Deleted

## 2018-09-20 ENCOUNTER — Other Ambulatory Visit: Payer: Self-pay

## 2018-09-20 ENCOUNTER — Ambulatory Visit (HOSPITAL_COMMUNITY): Payer: Medicare Other | Admitting: Anesthesiology

## 2018-09-20 ENCOUNTER — Ambulatory Visit (HOSPITAL_COMMUNITY)
Admission: RE | Admit: 2018-09-20 | Discharge: 2018-09-20 | Disposition: A | Discharge status: Home / Self Care | Payer: Medicare Other | Attending: Cardiovascular Disease | Admitting: Cardiovascular Disease

## 2018-09-20 ENCOUNTER — Encounter (HOSPITAL_COMMUNITY)
Admission: RE | Disposition: A | Discharge status: Home / Self Care | Payer: Self-pay | Source: Home / Self Care | Attending: Cardiovascular Disease

## 2018-09-20 ENCOUNTER — Encounter (HOSPITAL_COMMUNITY): Payer: Self-pay | Admitting: Cardiovascular Disease

## 2018-09-20 DIAGNOSIS — M109 Gout, unspecified: Secondary | ICD-10-CM | POA: Insufficient documentation

## 2018-09-20 DIAGNOSIS — I4819 Other persistent atrial fibrillation: Secondary | ICD-10-CM | POA: Diagnosis not present

## 2018-09-20 DIAGNOSIS — I10 Essential (primary) hypertension: Secondary | ICD-10-CM | POA: Diagnosis not present

## 2018-09-20 DIAGNOSIS — I272 Pulmonary hypertension, unspecified: Secondary | ICD-10-CM | POA: Diagnosis not present

## 2018-09-20 DIAGNOSIS — Z79899 Other long term (current) drug therapy: Secondary | ICD-10-CM | POA: Diagnosis not present

## 2018-09-20 DIAGNOSIS — E669 Obesity, unspecified: Secondary | ICD-10-CM | POA: Diagnosis not present

## 2018-09-20 DIAGNOSIS — E119 Type 2 diabetes mellitus without complications: Secondary | ICD-10-CM | POA: Insufficient documentation

## 2018-09-20 DIAGNOSIS — M1990 Unspecified osteoarthritis, unspecified site: Secondary | ICD-10-CM | POA: Diagnosis not present

## 2018-09-20 DIAGNOSIS — E785 Hyperlipidemia, unspecified: Secondary | ICD-10-CM | POA: Insufficient documentation

## 2018-09-20 DIAGNOSIS — Z794 Long term (current) use of insulin: Secondary | ICD-10-CM | POA: Diagnosis not present

## 2018-09-20 DIAGNOSIS — Z6841 Body Mass Index (BMI) 40.0 and over, adult: Secondary | ICD-10-CM | POA: Insufficient documentation

## 2018-09-20 DIAGNOSIS — Z7901 Long term (current) use of anticoagulants: Secondary | ICD-10-CM | POA: Insufficient documentation

## 2018-09-20 HISTORY — PX: CARDIOVERSION: SHX1299

## 2018-09-20 LAB — GLUCOSE, CAPILLARY: Glucose-Capillary: 105 mg/dL — ABNORMAL HIGH (ref 70–99)

## 2018-09-20 SURGERY — CARDIOVERSION
Anesthesia: General

## 2018-09-20 MED ORDER — LIDOCAINE 2% (20 MG/ML) 5 ML SYRINGE
INTRAMUSCULAR | Status: DC | PRN
  Administered 2018-09-20: 40 mg via INTRAVENOUS

## 2018-09-20 MED ORDER — SODIUM CHLORIDE 0.9 % IV SOLN
INTRAVENOUS | Status: DC
  Administered 2018-09-20: 12:00:00 via INTRAVENOUS

## 2018-09-20 MED ORDER — PROPOFOL 10 MG/ML IV BOLUS
INTRAVENOUS | Status: DC | PRN
  Administered 2018-09-20: 20 mg via INTRAVENOUS
  Administered 2018-09-20: 60 mg via INTRAVENOUS

## 2018-09-20 NOTE — Discharge Instructions (Signed)
Electrical Cardioversion, Care After °This sheet gives you information about how to care for yourself after your procedure. Your health care provider may also give you more specific instructions. If you have problems or questions, contact your health care provider. °What can I expect after the procedure? °After the procedure, it is common to have: °· Some redness on the skin where the shocks were given. °Follow these instructions at home: ° °· Do not drive for 24 hours if you were given a medicine to help you relax (sedative). °· Take over-the-counter and prescription medicines only as told by your health care provider. °· Ask your health care provider how to check your pulse. Check it often. °· Rest for 48 hours after the procedure or as told by your health care provider. °· Avoid or limit your caffeine use as told by your health care provider. °Contact a health care provider if: °· You feel like your heart is beating too quickly or your pulse is not regular. °· You have a serious muscle cramp that does not go away. °Get help right away if: ° °· You have discomfort in your chest. °· You are dizzy or you feel faint. °· You have trouble breathing or you are short of breath. °· Your speech is slurred. °· You have trouble moving an arm or leg on one side of your body. °· Your fingers or toes turn cold or blue. °This information is not intended to replace advice given to you by your health care provider. Make sure you discuss any questions you have with your health care provider. °Document Released: 02/06/2013 Document Revised: 11/20/2015 Document Reviewed: 10/23/2015 °Elsevier Interactive Patient Education © 2019 [employer] ° °

## 2018-09-20 NOTE — Interval H&P Note (Signed)
History and Physical Interval Note:  09/20/2018 11:49 AM  Mark Singh  has presented today for surgery, with the diagnosis of AFIB.  The various methods of treatment have been discussed with the patient and family. After consideration of risks, benefits and other options for treatment, the patient has consented to  Procedure(s): CARDIOVERSION (N/A) as a surgical intervention.  The patient's history has been reviewed, patient examined, no change in status, stable for surgery.  I have reviewed the patient's chart and labs.  Questions were answered to the patient's satisfaction.     [name], MD

## 2018-09-20 NOTE — Transfer of Care (Signed)
Immediate Anesthesia Transfer of Care Note  Patient: Mark Singh  Procedure(s) Performed: CARDIOVERSION (N/A )  Patient Location: Endoscopy Unit  Anesthesia Type:General  Level of Consciousness: drowsy and responds to stimulation  Airway & Oxygen Therapy: Patient Spontanous Breathing  Post-op Assessment: Report given to RN and Post -op Vital signs reviewed and stable  Post vital signs: Reviewed and stable  Last Vitals:  Vitals Value Taken Time  BP 118/86 09/20/2018 12:15 PM  Temp    Pulse 50 09/20/2018 12:16 PM  Resp 23 09/20/2018 12:16 PM  SpO2 97 % 09/20/2018 12:16 PM    Last Pain:  Vitals:   09/20/18 1117  TempSrc: Oral  PainSc: 0-No pain         Complications: No apparent anesthesia complications

## 2018-09-20 NOTE — Anesthesia Procedure Notes (Signed)
Procedure Name: General with mask airway Date/Time: 09/20/2018 12:04 PM Performed by: Elayne Snare, CRNA Pre-anesthesia Checklist: Patient identified, Emergency Drugs available, Patient being monitored, Suction available and Timeout performed Patient Re-evaluated:Patient Re-evaluated prior to induction Oxygen Delivery Method: Ambu bag Preoxygenation: Pre-oxygenation with 100% oxygen Induction Type: IV induction

## 2018-09-20 NOTE — CV Procedure (Signed)
Electrical Cardioversion Procedure Note Mark Singh [id number] 06/14/1945  Procedure: Electrical Cardioversion Indications:  Atrial Fibrillation  Procedure Details Consent: Risks of procedure as well as the alternatives and risks of each were explained to the (patient/caregiver).  Consent for procedure obtained. Time Out: Verified patient identification, verified procedure, site/side was marked, verified correct patient position, special equipment/implants available, medications/allergies/relevent history reviewed, required imaging and test results available.  Performed  Patient placed on cardiac monitor, pulse oximetry, supplemental oxygen as necessary.  Sedation given: pro Pacer pads placed anterior and posterior chest.  Cardioverted 1 time(s).  Cardioverted at [place].  Evaluation Findings: Post procedure EKG shows: sinus bradycardia Complications: None Patient did tolerate procedure well.   Mark Latch, MD 09/20/2018, 12:11 PM

## 2018-09-20 NOTE — Anesthesia Postprocedure Evaluation (Signed)
Anesthesia Post Note  Patient: Mark Singh  Procedure(s) Performed: CARDIOVERSION (N/A )     Patient location during evaluation: PACU Anesthesia Type: General Level of consciousness: awake and alert Pain management: pain level controlled Vital Signs Assessment: post-procedure vital signs reviewed and stable Respiratory status: spontaneous breathing, nonlabored ventilation and respiratory function stable Cardiovascular status: blood pressure returned to baseline and stable Postop Assessment: no apparent nausea or vomiting Anesthetic complications: no    Last Vitals:  Vitals:   09/20/18 1225 09/20/18 1235  BP: (!) 108/55 (!) 103/53  Pulse: (!) 49   Resp: 20 (!) 24  Temp:    SpO2: 93% 95%    Last Pain:  Vitals:   09/20/18 1235  TempSrc:   PainSc: 0-No pain                 Audry Pili

## 2018-09-20 NOTE — Anesthesia Preprocedure Evaluation (Addendum)
Anesthesia Evaluation  Patient identified by MRN, date of birth, ID band Patient awake    Reviewed: Allergy & Precautions, NPO status , Patient's Chart, lab work & pertinent test results, reviewed documented beta blocker date and time   History of Anesthesia Complications Negative for: history of anesthetic complications  Airway Mallampati: II  TM Distance: >3 FB Neck ROM: Full    Dental  (+) Chipped,    Pulmonary asthma ,    breath sounds clear to auscultation       Cardiovascular hypertension, Pt. on home beta blockers and Pt. on medications + dysrhythmias Atrial Fibrillation  Rhythm:Irregular Rate:Normal   '17 ETT - There was no ST segment deviation noted during stress. There were no adverse arrhythmias during exercise. QRS did not widen. Isolated PVC noted Low risk flecainide exercise challenge  '15 TTE - mildconcentric LVH. EF 55% to 60%. Severely dilated LA. PASP was mildly increased. PApeak pressure: 38 mmHg   Neuro/Psych negative neurological ROS  negative psych ROS   GI/Hepatic negative GI ROS, Neg liver ROS, Controlled,  Endo/Other  diabetes, Type 2, Insulin Dependent, Oral Hypoglycemic AgentsMorbid obesity  Renal/GU CRFRenal disease    Penile cancer     Musculoskeletal  (+) Arthritis ,  Gout    Abdominal (+) + obese,   Peds  Hematology  (+) anemia , HLD   Anesthesia Other Findings   Reproductive/Obstetrics                            Anesthesia Physical  Anesthesia Plan  ASA: III  Anesthesia Plan: General   Post-op Pain Management:    Induction: Intravenous  PONV Risk Score and Plan: 2 and Treatment may vary due to age or medical condition and Propofol infusion  Airway Management Planned: Natural Airway and Mask  Additional Equipment: None  Intra-op Plan:   Post-operative Plan:   Informed Consent: I have reviewed the patients History and Physical,  chart, labs and discussed the procedure including the risks, benefits and alternatives for the proposed anesthesia with the patient or authorized representative who has indicated his/her understanding and acceptance.       Plan Discussed with: CRNA  Anesthesia Plan Comments:        Anesthesia Quick Evaluation

## 2018-09-21 ENCOUNTER — Telehealth: Payer: Self-pay | Admitting: Cardiology

## 2018-09-21 ENCOUNTER — Encounter (HOSPITAL_COMMUNITY): Payer: Self-pay | Admitting: Cardiovascular Disease

## 2018-09-21 MED ORDER — METOPROLOL SUCCINATE ER 25 MG PO TB24: 25.0000 mg | ORAL_TABLET | Freq: Every day | ORAL | 3 refills | Status: DC

## 2018-09-21 NOTE — Telephone Encounter (Signed)
Per pt call - stated his heart rate is a little slow and would like a call back on what to do please.

## 2018-09-21 NOTE — Telephone Encounter (Signed)
Would recommend halving dose of Toprol to 25 mg unless Dr Curt Bears advises otherwise.

## 2018-09-21 NOTE — Telephone Encounter (Signed)
Advised pt to decrease Toprol to 25 mg daily. New Rx sent to pharmacy. Pt advised to call office if HRs do not improve and/or symptoms continue after medication decrease. Patient verbalized understanding and agreeable to plan.  Pt has f/u scheduled on 6/4 with AFib clinic.

## 2018-09-21 NOTE — Telephone Encounter (Signed)
Agree with decreasing toprol XL.

## 2018-09-21 NOTE — Telephone Encounter (Addendum)
Pt reporting low HR since DCCV yesterday.   HRs avg b/t 45-60 BP this morning 106/60 Pt feeling ok but weak and little off-balance walking. Denies SOB, syncope. DCCV yesterday Taking Flecainide 100  Mg BID, and Toprol 50 mg Will forward to [name] and AFib clinic - either one to advise. Pt understands I will call back with recommendation/s.

## 2018-09-25 ENCOUNTER — Telehealth: Payer: Self-pay | Admitting: Cardiology

## 2018-09-25 MED ORDER — METOPROLOL SUCCINATE ER 25 MG PO TB24: 12.5000 mg | ORAL_TABLET | Freq: Every day | ORAL | 3 refills | Status: DC

## 2018-09-25 NOTE — Telephone Encounter (Signed)
Forwarding to AFib clinic to advise as they recently made medication changes

## 2018-09-25 NOTE — Telephone Encounter (Signed)
New Message   Patient calling to report his heart rate is in the range of 50-55 when resting. And also states he's still a little dizzy when he stands up.

## 2018-09-25 NOTE — Telephone Encounter (Signed)
Called and spoke to pt. He reports HR in the upper 40s to low 50s. BP 120s-140s/80s. Dizziness on standing could be due to bradycardia now that he is back in SR. Will have him decrease Toprol further to 12.5 mg daily. F/u in AF clinic on 6/4. Overall, he feels better since the DCCV with less SOB and more energy.

## 2018-09-28 ENCOUNTER — Telehealth: Payer: Self-pay | Admitting: Cardiology

## 2018-09-28 MED ORDER — FUROSEMIDE 20 MG PO TABS: ORAL_TABLET | ORAL | 1 refills | Status: DC

## 2018-09-28 NOTE — Telephone Encounter (Signed)
Spoke with patient per Adline Peals, PA will start lasix 20mg  once a day through weekend with follow up on Monday. Pt stated he would like [facility] know as he was still there at their office. He reports they performed a chest xray as well. Pt verbalizes understanding of instructions.

## 2018-09-28 NOTE — Telephone Encounter (Signed)
New Message      Janett Billow is a PA and would like to discuss information  about the pt. She would like an appt for possibly Monday. She says Pt is retaining fluid in legs and belly and has SOB. Some fluid in his lungs.    Please call

## 2018-09-28 NOTE — Telephone Encounter (Signed)
Informed that I would get pt sooner visit in AFib clinic (currently schedule for 6/4) to assess pt further. Was unable to speak with Anderson Malta, [place] at [facility] to AFib clinic who will reach out to pt to arrange sooner follow up (he is scheduled to see them 6/4)c

## 2018-10-01 ENCOUNTER — Encounter (HOSPITAL_COMMUNITY): Payer: Self-pay | Admitting: Physician Assistant

## 2018-10-01 ENCOUNTER — Other Ambulatory Visit: Payer: Self-pay

## 2018-10-01 ENCOUNTER — Ambulatory Visit (HOSPITAL_COMMUNITY)
Admission: RE | Admit: 2018-10-01 | Discharge: 2018-10-01 | Disposition: A | Discharge status: Home / Self Care | Payer: Medicare Other | Source: Ambulatory Visit | Attending: Physician Assistant | Admitting: Physician Assistant

## 2018-10-01 VITALS — BP 150/68 | HR 58 | Ht 69.0 in | Wt 291.0 lb

## 2018-10-01 DIAGNOSIS — Z8249 Family history of ischemic heart disease and other diseases of the circulatory system: Secondary | ICD-10-CM | POA: Diagnosis not present

## 2018-10-01 DIAGNOSIS — E669 Obesity, unspecified: Secondary | ICD-10-CM | POA: Diagnosis not present

## 2018-10-01 DIAGNOSIS — E119 Type 2 diabetes mellitus without complications: Secondary | ICD-10-CM | POA: Diagnosis not present

## 2018-10-01 DIAGNOSIS — I4891 Unspecified atrial fibrillation: Secondary | ICD-10-CM

## 2018-10-01 DIAGNOSIS — Z6841 Body Mass Index (BMI) 40.0 and over, adult: Secondary | ICD-10-CM | POA: Diagnosis not present

## 2018-10-01 DIAGNOSIS — J45909 Unspecified asthma, uncomplicated: Secondary | ICD-10-CM | POA: Diagnosis not present

## 2018-10-01 DIAGNOSIS — Z7901 Long term (current) use of anticoagulants: Secondary | ICD-10-CM | POA: Insufficient documentation

## 2018-10-01 DIAGNOSIS — Z886 Allergy status to analgesic agent status: Secondary | ICD-10-CM | POA: Insufficient documentation

## 2018-10-01 DIAGNOSIS — I10 Essential (primary) hypertension: Secondary | ICD-10-CM | POA: Diagnosis not present

## 2018-10-01 DIAGNOSIS — I4819 Other persistent atrial fibrillation: Secondary | ICD-10-CM | POA: Insufficient documentation

## 2018-10-01 DIAGNOSIS — E785 Hyperlipidemia, unspecified: Secondary | ICD-10-CM | POA: Diagnosis not present

## 2018-10-01 DIAGNOSIS — R0600 Dyspnea, unspecified: Secondary | ICD-10-CM | POA: Diagnosis not present

## 2018-10-01 DIAGNOSIS — R609 Edema, unspecified: Secondary | ICD-10-CM | POA: Insufficient documentation

## 2018-10-01 DIAGNOSIS — Z7984 Long term (current) use of oral hypoglycemic drugs: Secondary | ICD-10-CM | POA: Insufficient documentation

## 2018-10-01 DIAGNOSIS — Z79899 Other long term (current) drug therapy: Secondary | ICD-10-CM | POA: Diagnosis not present

## 2018-10-01 DIAGNOSIS — I272 Pulmonary hypertension, unspecified: Secondary | ICD-10-CM | POA: Insufficient documentation

## 2018-10-01 LAB — BASIC METABOLIC PANEL
Anion gap: 7 (ref 5–15)
BUN: 16 mg/dL (ref 8–23)
CO2: 30 mmol/L (ref 22–32)
Calcium: 9.4 mg/dL (ref 8.9–10.3)
Chloride: 104 mmol/L (ref 98–111)
Creatinine, Ser: 1.46 mg/dL — ABNORMAL HIGH (ref 0.61–1.24)
GFR calc Af Amer: 55 mL/min — ABNORMAL LOW (ref >60)
GFR calc non Af Amer: 47 mL/min — ABNORMAL LOW (ref >60)
Glucose, Bld: 165 mg/dL — ABNORMAL HIGH (ref 70–99)
Potassium: 4.6 mmol/L (ref 3.5–5.1)
Sodium: 141 mmol/L (ref 135–145)

## 2018-10-01 LAB — CBC WITH DIFFERENTIAL/PLATELET
Abs Immature Granulocytes: 0.01 10*3/uL (ref 0.00–0.07)
Basophils Absolute: 0 10*3/uL (ref 0.0–0.1)
Basophils Relative: 1 %
Eosinophils Absolute: 0.1 10*3/uL (ref 0.0–0.5)
Eosinophils Relative: 1 %
HCT: 35.5 % — ABNORMAL LOW (ref 39.0–52.0)
Hemoglobin: 11 g/dL — ABNORMAL LOW (ref 13.0–17.0)
Immature Granulocytes: 0 %
Lymphocytes Relative: 13 %
Lymphs Abs: 0.9 10*3/uL (ref 0.7–4.0)
MCH: 29.6 pg (ref 26.0–34.0)
MCHC: 31 g/dL (ref 30.0–36.0)
MCV: 95.4 fL (ref 80.0–100.0)
Monocytes Absolute: 0.8 10*3/uL (ref 0.1–1.0)
Monocytes Relative: 12 %
Neutro Abs: 4.8 10*3/uL (ref 1.7–7.7)
Neutrophils Relative %: 73 %
Platelets: 227 10*3/uL (ref 150–400)
RBC: 3.72 MIL/uL — ABNORMAL LOW (ref 4.22–5.81)
RDW: 14.6 % (ref 11.5–15.5)
WBC: 6.6 10*3/uL (ref 4.0–10.5)
nRBC: 0 % (ref 0.0–0.2)

## 2018-10-01 MED ORDER — FLECAINIDE ACETATE 100 MG PO TABS: 100.0000 mg | ORAL_TABLET | Freq: Two times a day (BID) | ORAL | 1 refills | Status: DC

## 2018-10-01 MED ORDER — FUROSEMIDE 20 MG PO TABS: 20.0000 mg | ORAL_TABLET | Freq: Every day | ORAL | 1 refills | Status: DC

## 2018-10-01 NOTE — Patient Instructions (Signed)
Continue lasix daily  I have sent in a new refill of flecainide for the 100 mg tablet to your pharmacy  Please see [place] back in 4 months  I have reached out to [name]' nurse for an appt with him in 1 month.  She will call you.

## 2018-10-01 NOTE — Progress Notes (Signed)
Primary Care Physician: Kathyrn Lass, MD Primary Cardiologist: Dr Marlou Porch Primary Electrophysiologist: Dr Curt Bears Referring Physician: Dr Melanee Left is a 73 y.o. male with a history of persistent atrial fibrillation who presents for follow up in the [facility]. Patient reports doing reasonably well since his last visit. He states that his SOB has persisted since starting flecainide. He did not feel much different in rhythm. He does still try to walk on a regular basis. No symptoms of palpitations or heart racing. S/p DCCV 09/20/18. He reports that he had some bradycardia and dizziness. His metoprolol was decreased. On 09/28/18 he was at his PCP office and he reports fluid in his abdomen and legs with increased SOB. Started on Lasix over the weekend and has lost about 4 lbs. He reports that he is feeling much better although he is not quite back to his baseline weight. He denies any orthopnea or PND. He recently bought a exercise bike and he is able to use it longer and longer each day. No afib detected on Apple Watch.  Today, he denies symptoms of palpitations, chest pain, orthopnea, PND, dizziness, presyncope, syncope, snoring, daytime somnolence, bleeding, or neurologic sequela. The patient is tolerating medications without difficulties and is otherwise without complaint today. +edema   Atrial Fibrillation Risk Factors:  he does not have symptoms or diagnosis of sleep apnea. he does not have a history of rheumatic fever. he does not have a history of alcohol use.  he has a BMI of Body mass index is 42.97 kg/m.[name] Filed Weights   10/01/18 1436  Weight: 132 kg    Family History  Problem Relation Age of Onset  . Coronary artery disease Mother   . CVA Father      Atrial Fibrillation Management history:  Previous antiarrhythmic drugs: flecainide Previous ablations: 11/2017, 01/2018 CHADS2VASC score: 3 Anticoagulation history: Xarelto   Past Medical History:  Diagnosis Date  . Arthritis    "hands, feet" (12/13/2017)  . Asthma   . Bradycardia    a. Repoted h/o HR in the 40s.  . Edema    a. 2D echo 11/2013: EF 64-40%, LV diastolic function parameters were normal, severely dilated LA, PASP 22mmHg..  . H/O: gout    right foot; on daily RX" (12/13/2017)  . HTN (hypertension)   . Hyperlipidemia   . Myalgia and myositis, unspecified   . Nephrotic syndrome    a. responder to steroid therapy by Dr. Lorrene Reid.   . Obesity   . Orthostatic hypotension   . Penile cancer ([place])    "did circ"  . Pulmonary hypertension ([place])    a. Mild pulm HTN felt 2/2 obesity.  [name] PVC's (premature ventricular contractions)    a. Holter 05/2013 showing 18k PVCs (18% of the time).  . Seasonal asthma    mild  . Shoulder pain   . Squamous carcinoma    "right hand; left shoulder" (12/13/2017)  . Type 2 diabetes mellitus ([place])    Past Surgical History:  Procedure Laterality Date  . A-FLUTTER ABLATION N/A 02/23/2018   Procedure: A-FLUTTER ABLATION;  Surgeon: Constance Haw, MD;  Location: [place] [facility];  Service: Cardiovascular;  Laterality: N/A;  . ATRIAL FIBRILLATION ABLATION N/A 12/13/2017   Procedure: ATRIAL FIBRILLATION ABLATION;  Surgeon: Constance Haw, MD;  Location: [place] [facility];  Service: Cardiovascular;  Laterality: N/A;  . BUNIONECTOMY WITH HAMMERTOE RECONSTRUCTION Left 1992  &  2008   REMOVAL HEAL  SPUR/ BUNIONECTOMY AND HAMMERTOE REPAIR   . CARDIOVERSION N/A 07/22/2015   Procedure: CARDIOVERSION;  Surgeon: Dorothy Spark, MD;  Location: [place];  Service: Cardiovascular;  Laterality: N/A;  . CARDIOVERSION N/A 10/06/2015   Procedure: CARDIOVERSION;  Surgeon: Sueanne Margarita, MD;  Location: [place];  Service: Cardiovascular;  Laterality: N/A;  . CARDIOVERSION N/A 12/05/2016   Procedure: CARDIOVERSION;  Surgeon: Pixie Casino, MD;  Location: [facility] ENDOSCOPY;  Service: Cardiovascular;  Laterality: N/A;  .  CARDIOVERSION N/A 06/29/2018   Procedure: CARDIOVERSION;  Surgeon: Thayer Headings, MD;  Location[facility] ENDOSCOPY;  Service: Cardiovascular;  Laterality: N/A;  . CARDIOVERSION N/A 09/20/2018   Procedure: CARDIOVERSION;  Surgeon: [name], MD;  Location: [place];  Service: Cardiovascular;  Laterality: N/A;  . CIRCUMCISION N/A 11/27/2012   Procedure:  CIRCUMCISION  AND EXCISION OF GLANS PENIS;  Surgeon: Fredricka Bonine, MD;  Location: [facility];  Service: Urology;  Laterality: N/A;  . ELECTROPHYSIOLOGIC STUDY N/A 03/30/2015   Procedure: PVC Ablation;  Surgeon: [name], MD;  Location: [place] [facility];  Service: Cardiovascular;  Laterality: N/A;  . HERNIA REPAIR    . SQUAMOUS CELL CARCINOMA EXCISION     "right hand; left shoulder" (12/13/2017)  . UMBILICAL HERNIA REPAIR  11-04-2005    Current Outpatient Medications  Medication Sig Dispense Refill  . Cholecalciferol (VITAMIN D3) 5000 units CAPS Take 5,000 Units by mouth daily.     . Cyanocobalamin (VITAMIN B12) 3000 MCG SUBL Take 3,000 mcg by mouth daily.    . flecainide (TAMBOCOR) 50 MG tablet Take 2 tablets (100 mg total) by mouth 2 (two) times daily. 60 tablet 3  . Fluticasone-Salmeterol (WIXELA INHUB) 100-50 MCG/DOSE AEPB Inhale 1 puff into the lungs 2 (two) times daily as needed (for respiratory issues.).     [name] furosemide (LASIX) 20 MG tablet Take 1 tablet daily for the next 3 days then as needed for swelling/weight gain 30 tablet 1  . LANTUS SOLOSTAR 100 UNIT/ML Solostar Pen Inject 28 Units into the skin at bedtime.   0  . losartan (COZAAR) 25 MG tablet Take 50 mg by mouth daily.    . metFORMIN (GLUCOPHAGE) 1000 MG tablet Take 1,000 mg by mouth 2 (two) times daily.  0  . metoprolol succinate (TOPROL-XL) 25 MG 24 hr tablet Take 0.5 tablets (12.5 mg total) by mouth daily. Take with or immediately following a meal. 30 tablet 3  . pimecrolimus (ELIDEL) 1 % cream Apply 1 application topically 2  (two) times daily as needed (psoriasis).     . probenecid (BENEMID) 500 MG tablet Take 500 mg by mouth daily.     . simvastatin (ZOCOR) 40 MG tablet Take 40 mg by mouth every evening.     . tamsulosin (FLOMAX) 0.4 MG CAPS capsule Take 0.4 mg by mouth every evening.   4  . XARELTO 20 MG TABS tablet TAKE 1 TABLET (20 MG TOTAL) BY MOUTH DAILY WITH SUPPER. 30 tablet 11   No current facility-administered medications for this encounter.     Allergies  Allergen Reactions  . Indomethacin Other (See Comments)    dizziness    Social History   Socioeconomic History  . Marital status: Widowed    Spouse name: Not on file  . Number of children: Not on file  . Years of education: Not on file  . Highest education level: Not on file  Occupational History  . Not on file  Social Needs  .  Financial resource strain: Not on file  . Food insecurity:    Worry: Not on file    Inability: Not on file  . Transportation needs:    Medical: Not on file    Non-medical: Not on file  Tobacco Use  . Smoking status: Never Smoker  . Smokeless tobacco: Never Used  Substance and Sexual Activity  . Alcohol use: Never    Frequency: Never  . Drug use: Never  . Sexual activity: Not Currently  Lifestyle  . Physical activity:    Days per week: Not on file    Minutes per session: Not on file  . Stress: Not on file  Relationships  . Social connections:    Talks on phone: Not on file    Gets together: Not on file    Attends religious service: Not on file    Active member of club or organization: Not on file    Attends meetings of clubs or organizations: Not on file    Relationship status: Not on file  . Intimate partner violence:    Fear of current or ex partner: Not on file    Emotionally abused: Not on file    Physically abused: Not on file    Forced sexual activity: Not on file  Other Topics Concern  . Not on file  Social History Narrative  . Not on file     ROS- All systems are reviewed and  negative except as per the HPI above.  Physical Exam: Vitals:   10/01/18 1436  Weight: 132 kg  Height: 5\' 9"  (1.753 m)    GEN- The patient is well appearing obese male, alert and oriented x 3 today.   HEENT-head normocephalic, atraumatic, sclera clear, conjunctiva pink, hearing intact, trachea midline. Lungs- Clear to ausculation bilaterally, normal work of breathing Heart- Regular rate and rhythm, no murmurs, rubs or gallops  GI- soft, NT, ND, + BS Extremities- no clubbing, cyanosis, 1+ edema MS- no significant deformity or atrophy Skin- no rash or lesion Psych- euthymic mood, full affect Neuro- strength and sensation are intact   Wt Readings from Last 3 Encounters:  10/01/18 132 kg  09/20/18 129.3 kg  08/28/18 124.7 kg    EKG today demonstrates SR HR 58, 1st degree AV block, PR 218, QRS 104, QTc 431  Echo 11/2013 demonstrated  - Left ventricle: The cavity size was normal. There was mild concentric hypertrophy. Systolic function was normal. The estimated ejection fraction was in the range of 55% to 60%. Although no diagnostic regional wall motion abnormality was identified, this possibility cannot be completely excluded on the basis of this study. Left ventricular diastolic function parameters were normal. - Left atrium: The atrium was severely dilated. - Pulmonary arteries: Systolic pressure was mildly increased. PA peak pressure: 38 mm Hg (S).  Epic records are reviewed at length today  Assessment and Plan:  1. Persistent atrial fibrillation S/p DCCV 09/20/18, appears to be maintaining SR. Severely dilated LA noted on previous echo. SR may be difficult to maintain long term. Continue flecainide 100 mg BID Continue metoprolol 12.5 mg daily Continue Xarelto 20 mg daily. Recheck Bmet/CBC Continue monitoring with Apple Watch  This patients CHA2DS2-VASc Score and unadjusted Ischemic Stroke Rate (% per year) is equal to 3.2 % stroke rate/year from a  score of 3  Above score calculated as 1 point each if present [CHF, HTN, DM, Vascular=MI/PAD/Aortic Plaque, Age if 65-74, or Male] Above score calculated as 2 points each if present [Age >  75, or Stroke/TIA/TE]  2. Obesity Body mass index is 42.97 kg/m. Lifestyle modification was discussed and encouraged including regular physical activity and weight reduction.  3. HTN Elevated today. Pt brings in home BP log which shows well controlled BP. Will hold on making changes today.   4. Dyspnea/edema Improved with Lasix. Pt weight up about 6 lbs from DCCV. Pt to continue daily Lasix until back at dry weight. Then take only PRN.   Follow up with Dr Curt Bears in one month. AF clinic in 4 months.   [facility] [address] [place], [place] [id number] [phone] 10/01/2018 2:37 PM

## 2018-10-04 ENCOUNTER — Ambulatory Visit (HOSPITAL_COMMUNITY): Payer: Medicare Other | Admitting: Physician Assistant

## 2018-10-20 ENCOUNTER — Other Ambulatory Visit: Payer: Self-pay | Admitting: Physician Assistant

## 2018-10-24 ENCOUNTER — Other Ambulatory Visit (HOSPITAL_COMMUNITY): Payer: Self-pay | Admitting: Physician Assistant

## 2018-11-05 ENCOUNTER — Telehealth: Payer: Self-pay | Admitting: Cardiology

## 2018-11-05 NOTE — Telephone Encounter (Signed)

## 2018-11-06 ENCOUNTER — Ambulatory Visit (INDEPENDENT_AMBULATORY_CARE_PROVIDER_SITE_OTHER): Payer: Medicare Other | Admitting: Cardiology

## 2018-11-06 ENCOUNTER — Encounter: Payer: Self-pay | Admitting: Cardiology

## 2018-11-06 ENCOUNTER — Other Ambulatory Visit: Payer: Self-pay

## 2018-11-06 VITALS — BP 122/80 | HR 57 | Ht 69.0 in | Wt 273.0 lb

## 2018-11-06 DIAGNOSIS — I4819 Other persistent atrial fibrillation: Secondary | ICD-10-CM | POA: Diagnosis not present

## 2018-11-06 NOTE — Patient Instructions (Signed)
Medication Instructions:  Your physician recommends that you continue on your current medications as directed. Please refer to the Current Medication list given to you today.  If you need a refill on your cardiac medications before your next appointment, please call your pharmacy.   Labwork: None ordered  Testing/Procedures: None ordered  Follow-Up: Your physician wants you to follow-up in: 6 months with Dr. Camnitz.  You will receive a reminder letter in the mail two months in advance. If you don't receive a letter, please call our office to schedule the follow-up appointment.  Thank you for choosing [facility]!!   Rolf Fells, RN [phone]         

## 2018-11-06 NOTE — Progress Notes (Signed)
PCP:  Kathyrn Lass, MD Primary Cardiologist: No primary care provider on file. Electrophysiologist: [name], MD   Mark Singh is a 73 y.o. male who presents today for routine electrophysiology followup. He has a history of PVC's, bradycardia, nephrotic syndrome (complete responder to steroid therapy by Dr. Lorrene Reid), DM, HTN, HLD, obesity, orthostatic hypotension, and mild pulm HTN felt 2/2 obesity Comes in today now s/p ablation procedure for his PVC's.  PVCs were mapped to inferior to the [facility] and successfully ablated on 03/30/15.  He subsequently went into atrial fibrillation and had ablation 12/13/2017.  He subsequently presented back to atrial fibrillation clinic in atrial flutter.  He is status post atrial flutter ablation on 02/26/2018. He is s/p repeat DCCV 09/20/2018.  Since his visit to the afib clinic, he is feeling well overall. Fluid status has improved on daily lasix. He is taking careful precautions when going out in public. He denies symptoms of palpitations, chest pain, shortness of breath, orthopnea, PND, claudication, dizziness, presyncope, syncope, bleeding, or neurologic sequela. The patient is tolerating medications without difficulties.    Past Medical History:  Diagnosis Date  . Arthritis    "hands, feet" (12/13/2017)  . Asthma   . Bradycardia    a. Repoted h/o HR in the 40s.  . Edema    a. 2D echo 11/2013: EF 93-23%, LV diastolic function parameters were normal, severely dilated LA, PASP 71mmHg..  . H/O: gout    right foot; on daily RX" (12/13/2017)  . HTN (hypertension)   . Hyperlipidemia   . Myalgia and myositis, unspecified   . Nephrotic syndrome    a. responder to steroid therapy by Dr. Lorrene Reid.   . Obesity   . Orthostatic hypotension   . Penile cancer ([place])    "did circ"  . Pulmonary hypertension ([place])    a. Mild pulm HTN felt 2/2 obesity.  [name] PVC's (premature ventricular contractions)    a. Holter 05/2013 showing 18k PVCs (18% of the  time).  . Seasonal asthma    mild  . Shoulder pain   . Squamous carcinoma    "right hand; left shoulder" (12/13/2017)  . Type 2 diabetes mellitus ([place])    Past Surgical History:  Procedure Laterality Date  . A-FLUTTER ABLATION N/A 02/23/2018   Procedure: A-FLUTTER ABLATION;  Surgeon: Constance Haw, MD;  Location: [place] [facility];  Service: Cardiovascular;  Laterality: N/A;  . ATRIAL FIBRILLATION ABLATION N/A 12/13/2017   Procedure: ATRIAL FIBRILLATION ABLATION;  Surgeon: Constance Haw, MD;  Location: [place] [facility];  Service: Cardiovascular;  Laterality: N/A;  . BUNIONECTOMY WITH HAMMERTOE RECONSTRUCTION Left 1992  &  2008   REMOVAL HEAL SPUR/ BUNIONECTOMY AND HAMMERTOE REPAIR   . CARDIOVERSION N/A 07/22/2015   Procedure: CARDIOVERSION;  Surgeon: Dorothy Spark, MD;  Location: [place];  Service: Cardiovascular;  Laterality: N/A;  . CARDIOVERSION N/A 10/06/2015   Procedure: CARDIOVERSION;  Surgeon: Sueanne Margarita, MD;  Location: [facility] ENDOSCOPY;  Service: Cardiovascular;  Laterality: N/A;  . CARDIOVERSION N/A 12/05/2016   Procedure: CARDIOVERSION;  Surgeon: Pixie Casino, MD;  Location: [facility] ENDOSCOPY;  Service: Cardiovascular;  Laterality: N/A;  . CARDIOVERSION N/A 06/29/2018   Procedure: CARDIOVERSION;  Surgeon: Thayer Headings, MD;  Location: [facility] ENDOSCOPY;  Service: Cardiovascular;  Laterality: N/A;  . CARDIOVERSION N/A 09/20/2018   Procedure: CARDIOVERSION;  Surgeon: [name], MD;  Location: [place];  Service: Cardiovascular;  Laterality: N/A;  . CIRCUMCISION N/A 11/27/2012   Procedure:  CIRCUMCISION  AND EXCISION OF GLANS PENIS;  Surgeon: Fredricka Bonine, MD;  Location: [facility];  Service: Urology;  Laterality: N/A;  . ELECTROPHYSIOLOGIC STUDY N/A 03/30/2015   Procedure: PVC Ablation;  Surgeon: [name], MD;  Location: [place] [facility];  Service: Cardiovascular;  Laterality: N/A;  . HERNIA REPAIR    . SQUAMOUS  CELL CARCINOMA EXCISION     "right hand; left shoulder" (12/13/2017)  . UMBILICAL HERNIA REPAIR  11-04-2005    Current Outpatient Medications  Medication Sig Dispense Refill  . Cholecalciferol (VITAMIN D3) 5000 units CAPS Take 5,000 Units by mouth daily.     . Cyanocobalamin (VITAMIN B12) 3000 MCG SUBL Take 3,000 mcg by mouth daily.    . flecainide (TAMBOCOR) 100 MG tablet Take 1 tablet (100 mg total) by mouth 2 (two) times daily. 60 tablet 1  . furosemide (LASIX) 20 MG tablet Take 1 tablet (20 mg total) by mouth as needed for fluid. 30 tablet 1  . LANTUS SOLOSTAR 100 UNIT/ML Solostar Pen Inject 28 Units into the skin at bedtime.   0  . losartan (COZAAR) 50 MG tablet Take 50 mg by mouth daily.    . metFORMIN (GLUCOPHAGE) 1000 MG tablet Take 1,000 mg by mouth 2 (two) times daily.  0  . metoprolol succinate (TOPROL-XL) 25 MG 24 hr tablet Take 0.5 tablets (12.5 mg total) by mouth daily. Take with or immediately following a meal. 30 tablet 3  . omeprazole (PRILOSEC) 20 MG capsule Take 20 mg by mouth daily.    . pimecrolimus (ELIDEL) 1 % cream Apply 1 application topically 2 (two) times daily as needed (psoriasis).     . probenecid (BENEMID) 500 MG tablet Take 500 mg by mouth daily.     . simvastatin (ZOCOR) 40 MG tablet Take 40 mg by mouth every evening.     . tamsulosin (FLOMAX) 0.4 MG CAPS capsule Take 0.4 mg by mouth every evening.   4  . WIXELA INHUB 100-50 MCG/DOSE AEPB Inhale 2 puffs into the lungs daily as needed.    Alveda Reasons 20 MG TABS tablet TAKE 1 TABLET (20 MG TOTAL) BY MOUTH DAILY WITH SUPPER. 30 tablet 11   No current facility-administered medications for this visit.    Allergies  Allergen Reactions  . Indomethacin Other (See Comments)    dizziness   Social History   Socioeconomic History  . Marital status: Widowed    Spouse name: Not on file  . Number of children: Not on file  . Years of education: Not on file  . Highest education level: Not on file  Occupational  History  . Not on file  Social Needs  . Financial resource strain: Not on file  . Food insecurity    Worry: Not on file    Inability: Not on file  . Transportation needs    Medical: Not on file    Non-medical: Not on file  Tobacco Use  . Smoking status: Never Smoker  . Smokeless tobacco: Never Used  Substance and Sexual Activity  . Alcohol use: Never    Frequency: Never  . Drug use: Never  . Sexual activity: Not Currently  Lifestyle  . Physical activity    Days per week: Not on file    Minutes per session: Not on file  . Stress: Not on file  Relationships  . Social Herbalist on phone: Not on file    Gets together: Not on file  Attends religious service: Not on file    Active member of club or organization: Not on file    Attends meetings of clubs or organizations: Not on file    Relationship status: Not on file  . Intimate partner violence    Fear of current or ex partner: Not on file    Emotionally abused: Not on file    Physically abused: Not on file    Forced sexual activity: Not on file  Other Topics Concern  . Not on file  Social History Narrative  . Not on file   Review of Systems: General: No chills, fever, night sweats or weight changes  Cardiovascular:  No chest pain, dyspnea on exertion, edema, orthopnea, palpitations, paroxysmal nocturnal dyspnea Dermatological: No rash, lesions or masses Respiratory: No cough, dyspnea Urologic: No hematuria, dysuria Abdominal: No nausea, vomiting, diarrhea, bright red blood per rectum, melena, or hematemesis Neurologic: No visual changes, weakness, changes in mental status All other systems reviewed and are otherwise negative except as noted above.  Physical Exam: Vitals:   11/06/18 1000  BP: 122/80  Pulse: (!) 57  Weight: 273 lb (123.8 kg)  Height: 5\' 9"  (1.753 m)    GEN- The patient is well appearing, alert and oriented x 3 today.   HEENT: normocephalic, atraumatic; sclera clear, conjunctiva  pink; hearing intact; oropharynx clear; neck supple, no JVP Lymph- no cervical lymphadenopathy Lungs- Clear to ausculation bilaterally, normal work of breathing.  No wheezes, rales, rhonchi Heart- Regular rate and rhythm, no murmurs, rubs or gallops, PMI not laterally displaced GI- Obese. soft, non-tender, non-distended, bowel sounds present, no hepatosplenomegaly Extremities- no clubbing, cyanosis, or edema; DP/PT/radial pulses 2+ bilaterally MS- no significant deformity or atrophy Skin- warm and dry, no rash or lesion Psych- euthymic mood, full affect Neuro- strength and sensation are intact  Assessment and Plan:  1. Persistent atrial fibrillation S/p DCCV 09/20/18, EKG today shows Sinus bradycardia 57 bpm. Severely dilated LA noted on previous echo. SR may be difficult to maintain long term. Continue flecainide 100 mg BID Continue metoprolol 12.5 mg daily Continue Xarelto 20 mg daily. Labs stable 10/01/2018 Continue monitoring with Apple Watch  This patients CHA2DS2-VASc Score and unadjusted Ischemic Stroke Rate (% per year) is equal to 3.2 % stroke rate/year from a score of 3  Above score calculated as 1 point each if present [CHF, HTN, DM, Vascular=MI/PAD/Aortic Plaque, Age if 65-74, or Male] Above score calculated as 2 points each if present [Age > 75, or Stroke/TIA/TE]  2. Obesity Body mass index is 40.32 kg/m. Encouraged lifestyle modification with diet and exercise.   3. HTN - Well controlled at home.   4. Dyspnea/edema - Continue lasix daily for now. He sees PCP for labs Thursday. Educated on sodium and fluid restrictions.    [name], MD  11/06/18 11:33 AM  I have seen and examined this patient with Oda Kilts.  Agree with above, note added to reflect my findings.  On exam, RRR, no murmurs, lungs clear.  Patient with persistent atrial fibrillation status post ablation.  He also has atrial flutter.  He is currently doing well.  He is remained in sinus  rhythm since his most recent cardioversion.  We will continue with current management.  Will M. Camnitz MD 11/06/2018 11:33 AM

## 2018-11-14 ENCOUNTER — Other Ambulatory Visit (HOSPITAL_COMMUNITY): Payer: Self-pay | Admitting: *Deleted

## 2018-11-14 MED ORDER — FUROSEMIDE 20 MG PO TABS: 20.0000 mg | ORAL_TABLET | ORAL | 1 refills | Status: DC | PRN

## 2018-11-19 ENCOUNTER — Other Ambulatory Visit (HOSPITAL_COMMUNITY): Payer: Self-pay | Admitting: Physician Assistant

## 2018-11-27 ENCOUNTER — Other Ambulatory Visit (HOSPITAL_COMMUNITY): Payer: Self-pay | Admitting: Physician Assistant

## 2018-12-06 ENCOUNTER — Other Ambulatory Visit (HOSPITAL_COMMUNITY): Payer: Self-pay | Admitting: Physician Assistant

## 2018-12-10 ENCOUNTER — Other Ambulatory Visit (HOSPITAL_COMMUNITY): Payer: Self-pay | Admitting: *Deleted

## 2018-12-10 MED ORDER — FLECAINIDE ACETATE 100 MG PO TABS: 100.0000 mg | ORAL_TABLET | Freq: Two times a day (BID) | ORAL | 1 refills | Status: DC

## 2018-12-14 ENCOUNTER — Other Ambulatory Visit: Payer: Self-pay | Admitting: Cardiology

## 2019-01-02 ENCOUNTER — Other Ambulatory Visit (HOSPITAL_COMMUNITY): Payer: Self-pay | Admitting: Physician Assistant

## 2019-01-08 ENCOUNTER — Other Ambulatory Visit: Payer: Self-pay | Admitting: Cardiology

## 2019-01-08 NOTE — Telephone Encounter (Signed)
Age 73, weight 123.8kg, SCr 1.46 on 10/01/18, CrCl 59mL/min, last OV 10/2018, afib indication

## 2019-01-24 ENCOUNTER — Other Ambulatory Visit (HOSPITAL_COMMUNITY): Payer: Self-pay | Admitting: Physician Assistant

## 2019-02-04 ENCOUNTER — Ambulatory Visit (HOSPITAL_COMMUNITY): Payer: Medicare Other | Admitting: Physician Assistant

## 2019-02-14 ENCOUNTER — Other Ambulatory Visit: Payer: Self-pay

## 2019-02-14 ENCOUNTER — Ambulatory Visit (HOSPITAL_COMMUNITY)
Admission: RE | Admit: 2019-02-14 | Discharge: 2019-02-14 | Disposition: A | Discharge status: Home / Self Care | Payer: Medicare Other | Source: Ambulatory Visit | Attending: Physician Assistant | Admitting: Physician Assistant

## 2019-02-14 VITALS — BP 170/64 | HR 60 | Ht 69.0 in | Wt 272.4 lb

## 2019-02-14 DIAGNOSIS — M109 Gout, unspecified: Secondary | ICD-10-CM | POA: Insufficient documentation

## 2019-02-14 DIAGNOSIS — I10 Essential (primary) hypertension: Secondary | ICD-10-CM | POA: Insufficient documentation

## 2019-02-14 DIAGNOSIS — E785 Hyperlipidemia, unspecified: Secondary | ICD-10-CM | POA: Insufficient documentation

## 2019-02-14 DIAGNOSIS — E669 Obesity, unspecified: Secondary | ICD-10-CM | POA: Insufficient documentation

## 2019-02-14 DIAGNOSIS — J45909 Unspecified asthma, uncomplicated: Secondary | ICD-10-CM | POA: Diagnosis not present

## 2019-02-14 DIAGNOSIS — M1990 Unspecified osteoarthritis, unspecified site: Secondary | ICD-10-CM | POA: Insufficient documentation

## 2019-02-14 DIAGNOSIS — R0683 Snoring: Secondary | ICD-10-CM | POA: Insufficient documentation

## 2019-02-14 DIAGNOSIS — Z79899 Other long term (current) drug therapy: Secondary | ICD-10-CM | POA: Insufficient documentation

## 2019-02-14 DIAGNOSIS — Z794 Long term (current) use of insulin: Secondary | ICD-10-CM | POA: Diagnosis not present

## 2019-02-14 DIAGNOSIS — Z7901 Long term (current) use of anticoagulants: Secondary | ICD-10-CM | POA: Diagnosis not present

## 2019-02-14 DIAGNOSIS — Z6841 Body Mass Index (BMI) 40.0 and over, adult: Secondary | ICD-10-CM | POA: Diagnosis not present

## 2019-02-14 DIAGNOSIS — E119 Type 2 diabetes mellitus without complications: Secondary | ICD-10-CM | POA: Insufficient documentation

## 2019-02-14 DIAGNOSIS — I4819 Other persistent atrial fibrillation: Secondary | ICD-10-CM | POA: Diagnosis not present

## 2019-02-14 MED ORDER — LOSARTAN POTASSIUM 100 MG PO TABS: 100.0000 mg | ORAL_TABLET | Freq: Every day | ORAL | 2 refills | Status: DC

## 2019-02-14 NOTE — Patient Instructions (Signed)
Increase losartan to 100mg once a day 

## 2019-02-14 NOTE — Progress Notes (Signed)
Primary Care Physician: Kathyrn Lass, MD Primary Cardiologist: Dr Marlou Porch Primary Electrophysiologist: Dr Curt Bears Referring Physician: Dr Melanee Left is a 73 y.o. male with a history of persistent atrial fibrillation who presents for follow up in the [facility]. Patient reports doing reasonably well since his last visit. He states that his SOB has persisted since starting flecainide. He did not feel much different in rhythm. He does still try to walk on a regular basis. No symptoms of palpitations or heart racing. S/p DCCV 09/20/18. He reports that he had some bradycardia and dizziness. His metoprolol was decreased. On 09/28/18 he was at his PCP office and he reports fluid in his abdomen and legs with increased SOB. Started on Lasix with symptomatic relief.   On follow up today, patient reports doing very well since his last visit. He has not had any heart racing or palpitations. He has not had any afib noted on his Apple Watch. He does note that his BP has been creeping up over the last few weeks with systolic readings [id number]. He does admit to snoring and daytime somnolence.   Today, he denies symptoms of palpitations, chest pain, orthopnea, PND, dizziness, presyncope, syncope, bleeding, or neurologic sequela. The patient is tolerating medications without difficulties and is otherwise without complaint today.    Atrial Fibrillation Risk Factors:  he does not have symptoms or diagnosis of sleep apnea. he does not have a history of rheumatic fever. he does not have a history of alcohol use.  he has a BMI of Body mass index is 40.23 kg/m.[name] Filed Weights   02/14/19 1529  Weight: 123.6 kg    Family History  Problem Relation Age of Onset  . Coronary artery disease Mother   . CVA Father      Atrial Fibrillation Management history:  Previous antiarrhythmic drugs: flecainide Previous cardioversion: several, most recently 09/20/18  Previous ablations: 11/2017, 01/2018 CHADS2VASC score: 3 Anticoagulation history: Xarelto   Past Medical History:  Diagnosis Date  . Arthritis    "hands, feet" (12/13/2017)  . Asthma   . Bradycardia    a. Repoted h/o HR in the 40s.  . Edema    a. 2D echo 11/2013: EF 94-50%, LV diastolic function parameters were normal, severely dilated LA, PASP 70mmHg..  . H/O: gout    right foot; on daily RX" (12/13/2017)  . HTN (hypertension)   . Hyperlipidemia   . Myalgia and myositis, unspecified   . Nephrotic syndrome    a. responder to steroid therapy by Dr. Lorrene Reid.   . Obesity   . Orthostatic hypotension   . Penile cancer ([place])    "did circ"  . Pulmonary hypertension ([place])    a. Mild pulm HTN felt 2/2 obesity.  [name] PVC's (premature ventricular contractions)    a. Holter 05/2013 showing 18k PVCs (18% of the time).  . Seasonal asthma    mild  . Shoulder pain   . Squamous carcinoma    "right hand; left shoulder" (12/13/2017)  . Type 2 diabetes mellitus ([place])    Past Surgical History:  Procedure Laterality Date  . A-FLUTTER ABLATION N/A 02/23/2018   Procedure: A-FLUTTER ABLATION;  Surgeon: Constance Haw, MD;  Location: [place] [facility];  Service: Cardiovascular;  Laterality: N/A;  . ATRIAL FIBRILLATION ABLATION N/A 12/13/2017   Procedure: ATRIAL FIBRILLATION ABLATION;  Surgeon: Constance Haw, MD;  Location: [facility];  Service: Cardiovascular;  Laterality: N/A;  . Lillard Anes  WITH HAMMERTOE RECONSTRUCTION Left 1992  &  2008   REMOVAL HEAL SPUR/ BUNIONECTOMY AND HAMMERTOE REPAIR   . CARDIOVERSION N/A 07/22/2015   Procedure: CARDIOVERSION;  Surgeon: Dorothy Spark, MD;  Location: [facility];  Service: Cardiovascular;  Laterality: N/A;  . CARDIOVERSION N/A 10/06/2015   Procedure: CARDIOVERSION;  Surgeon: Sueanne Margarita, MD;  Location: [place];  Service: Cardiovascular;  Laterality: N/A;  . CARDIOVERSION N/A 12/05/2016   Procedure: CARDIOVERSION;  Surgeon: Pixie Casino, MD;  Location: [facility] ENDOSCOPY;  Service: Cardiovascular;  Laterality: N/A;  . CARDIOVERSION N/A 06/29/2018   Procedure: CARDIOVERSION;  Surgeon: Thayer Headings, MD;  Location: [facility] ENDOSCOPY;  Service: Cardiovascular;  Laterality: N/A;  . CARDIOVERSION N/A 09/20/2018   Procedure: CARDIOVERSION;  Surgeon: [name], MD;  Location: [place];  Service: Cardiovascular;  Laterality: N/A;  . CIRCUMCISION N/A 11/27/2012   Procedure:  CIRCUMCISION  AND EXCISION OF GLANS PENIS;  Surgeon: Fredricka Bonine, MD;  Location: [facility];  Service: Urology;  Laterality: N/A;  . ELECTROPHYSIOLOGIC STUDY N/A 03/30/2015   Procedure: PVC Ablation;  Surgeon: [name], MD;  Location: [place] [facility];  Service: Cardiovascular;  Laterality: N/A;  . HERNIA REPAIR    . SQUAMOUS CELL CARCINOMA EXCISION     "right hand; left shoulder" (12/13/2017)  . UMBILICAL HERNIA REPAIR  11-04-2005    Current Outpatient Medications  Medication Sig Dispense Refill  . Cholecalciferol (VITAMIN D3) 5000 units CAPS Take 5,000 Units by mouth daily.     . Cyanocobalamin (VITAMIN B12) 3000 MCG SUBL Take 3,000 mcg by mouth daily.    . flecainide (TAMBOCOR) 100 MG tablet TAKE 1 TABLET BY MOUTH TWICE A DAY 60 tablet 6  . furosemide (LASIX) 20 MG tablet TAKE 1 TABLET (20 MG TOTAL) BY MOUTH AS NEEDED FOR FLUID. 30 tablet 1  . LANTUS SOLOSTAR 100 UNIT/ML Solostar Pen Inject 28 Units into the skin at bedtime.   0  . losartan (COZAAR) 50 MG tablet Take 50 mg by mouth daily.    . metFORMIN (GLUCOPHAGE) 1000 MG tablet Take 1,000 mg by mouth 2 (two) times daily.  0  . metoprolol succinate (TOPROL-XL) 25 MG 24 hr tablet TAKE 1 TABLET (25 MG TOTAL) BY MOUTH DAILY. TAKE WITH OR IMMEDIATELY FOLLOWING A MEAL. (Patient taking differently: Taking 1/2 tablet daily) 90 tablet 3  . omeprazole (PRILOSEC) 20 MG capsule Take 20 mg by mouth daily.    Glory Rosebush Delica Lancets 68T MISC USE AS DIRECTED EVERY DAY     . ONETOUCH VERIO test strip USE AS DIRECTED (TEST DAILY) 90 **E11.9    . pimecrolimus (ELIDEL) 1 % cream Apply 1 application topically 2 (two) times daily as needed (psoriasis).     . probenecid (BENEMID) 500 MG tablet Take 500 mg by mouth daily.     . simvastatin (ZOCOR) 40 MG tablet Take 40 mg by mouth every evening.     . tamsulosin (FLOMAX) 0.4 MG CAPS capsule Take 0.4 mg by mouth every evening.   4  . WIXELA INHUB 100-50 MCG/DOSE AEPB Inhale 2 puffs into the lungs daily as needed.    Alveda Reasons 20 MG TABS tablet TAKE 1 TABLET (20 MG TOTAL) BY MOUTH DAILY WITH SUPPER. 90 tablet 1  . NON FORMULARY Take by mouth.     No current facility-administered medications for this encounter.     Allergies  Allergen Reactions  . Indomethacin Other (See Comments)    dizziness  Social History   Socioeconomic History  . Marital status: Widowed    Spouse name: Not on file  . Number of children: Not on file  . Years of education: Not on file  . Highest education level: Not on file  Occupational History  . Not on file  Social Needs  . Financial resource strain: Not on file  . Food insecurity    Worry: Not on file    Inability: Not on file  . Transportation needs    Medical: Not on file    Non-medical: Not on file  Tobacco Use  . Smoking status: Never Smoker  . Smokeless tobacco: Never Used  Substance and Sexual Activity  . Alcohol use: Never    Frequency: Never  . Drug use: Never  . Sexual activity: Not Currently  Lifestyle  . Physical activity    Days per week: Not on file    Minutes per session: Not on file  . Stress: Not on file  Relationships  . Social Herbalist on phone: Not on file    Gets together: Not on file    Attends religious service: Not on file    Active member of club or organization: Not on file    Attends meetings of clubs or organizations: Not on file    Relationship status: Not on file  . Intimate partner violence    Fear of current or ex  partner: Not on file    Emotionally abused: Not on file    Physically abused: Not on file    Forced sexual activity: Not on file  Other Topics Concern  . Not on file  Social History Narrative  . Not on file     ROS- All systems are reviewed and negative except as per the HPI above.  Physical Exam: Vitals:   02/14/19 1529  BP: (!) 170/64  Pulse: 60  Weight: 123.6 kg  Height: 5\' 9"  (1.753 m)    GEN- The patient is well appearing obese male, alert and oriented x 3 today.   HEENT-head normocephalic, atraumatic, sclera clear, conjunctiva pink, hearing intact, trachea midline. Lungs- Clear to ausculation bilaterally, normal work of breathing Heart- Regular rate and rhythm, no murmurs, rubs or gallops  GI- soft, NT, ND, + BS Extremities- no clubbing, cyanosis. Trace bilateral edema MS- no significant deformity or atrophy Skin- no rash or lesion Psych- euthymic mood, full affect Neuro- strength and sensation are intact   Wt Readings from Last 3 Encounters:  02/14/19 123.6 kg  11/06/18 123.8 kg  10/01/18 132 kg    EKG today demonstrates SR HR 60, PR 170, QRS 104, QTc 424  Echo 11/2013 demonstrated  - Left ventricle: The cavity size was normal. There was mild concentric hypertrophy. Systolic function was normal. The estimated ejection fraction was in the range of 55% to 60%. Although no diagnostic regional wall motion abnormality was identified, this possibility cannot be completely excluded on the basis of this study. Left ventricular diastolic function parameters were normal. - Left atrium: The atrium was severely dilated. - Pulmonary arteries: Systolic pressure was mildly increased. PA peak pressure: 38 mm Hg (S).  Epic records are reviewed at length today  Assessment and Plan:  1. Persistent atrial fibrillation S/p DCCV 09/20/18. Patient appears to be maintaining SR. Continue flecainide 100 mg BID. Continue metoprolol 12.5 mg daily Continue Xarelto  20 mg daily Continue monitoring with Apple Watch Lifestyle changes as below.   This patients CHA2DS2-VASc Score and unadjusted  Ischemic Stroke Rate (% per year) is equal to 3.2 % stroke rate/year from a score of 3  Above score calculated as 1 point each if present [CHF, HTN, DM, Vascular=MI/PAD/Aortic Plaque, Age if 65-74, or Male] Above score calculated as 2 points each if present [Age > 75, or Stroke/TIA/TE]  2. Obesity Body mass index is 40.23 kg/m. Lifestyle modification was discussed and encouraged including regular physical activity and weight reduction. Patient has been doing well walking daily.  3. HTN Elevated again today and patient reports it is elevated at home.  Will increase losartan to 100 mg daily Check Bmet in 2 weeks. Pt to keep BP log for review at upcoming wellness physical.   4. Snoring/daytime somnolence Patient having symptoms of snoring and daytime somnolence. Will refer to Dr Maxwell Caul for evaluation.   Follow up with Dr Curt Bears per recall. AF clinic in 6 months.    [facility] [address] [place], [place] [address] [phone] 02/14/2019 3:39 PM

## 2019-02-18 ENCOUNTER — Other Ambulatory Visit (HOSPITAL_COMMUNITY): Payer: Self-pay

## 2019-02-18 MED ORDER — FUROSEMIDE 20 MG PO TABS: 20.0000 mg | ORAL_TABLET | ORAL | 6 refills | Status: DC | PRN

## 2019-02-20 ENCOUNTER — Encounter: Payer: Self-pay | Admitting: Podiatry

## 2019-02-20 ENCOUNTER — Other Ambulatory Visit: Payer: Self-pay | Admitting: Podiatry

## 2019-02-20 ENCOUNTER — Other Ambulatory Visit: Payer: Self-pay

## 2019-02-20 ENCOUNTER — Ambulatory Visit: Payer: Medicare Other | Admitting: Podiatry

## 2019-02-20 ENCOUNTER — Ambulatory Visit (INDEPENDENT_AMBULATORY_CARE_PROVIDER_SITE_OTHER): Payer: Medicare Other

## 2019-02-20 DIAGNOSIS — M778 Other enthesopathies, not elsewhere classified: Secondary | ICD-10-CM | POA: Diagnosis not present

## 2019-02-20 DIAGNOSIS — M79671 Pain in right foot: Secondary | ICD-10-CM

## 2019-02-21 NOTE — Progress Notes (Signed)
Subjective:   Patient ID: Mark Singh, male   DOB: 73 y.o.   MRN: [id number]   HPI Patient states he started to develop a lot of pain on top of his right foot again admits it has been for a number of months but is worsened recently.  States it is worse with certain types of shoes and it makes it hard for him to be active   ROS      Objective:  Physical Exam  Neurovascular status intact with patient noted to have exquisite discomfort dorsum of the right foot in the midfoot area along the extensor tendon complex with no indication of muscle strength loss     Assessment:  Acute extensor tendinitis right with inflammation     Plan:  Reviewed condition x-ray and today I did sterile prep and injected the dorsal tendon complex 3 mg dexamethasone Kenalog 5 mg Xylocaine and advised on home diclofenac gel usage.  Reappoint to recheck  X-rays indicate there is multiple signs of mild to moderate arthritis in the midtarsal joint right with no indication of stress fracture

## 2019-03-01 ENCOUNTER — Other Ambulatory Visit: Payer: Self-pay

## 2019-03-01 ENCOUNTER — Ambulatory Visit (HOSPITAL_COMMUNITY)
Admission: RE | Admit: 2019-03-01 | Discharge: 2019-03-01 | Disposition: A | Discharge status: Home / Self Care | Payer: Medicare Other | Source: Ambulatory Visit | Attending: Physician Assistant | Admitting: Physician Assistant

## 2019-03-01 DIAGNOSIS — I10 Essential (primary) hypertension: Secondary | ICD-10-CM | POA: Insufficient documentation

## 2019-03-01 LAB — BASIC METABOLIC PANEL
Anion gap: 10 (ref 5–15)
BUN: 18 mg/dL (ref 8–23)
CO2: 27 mmol/L (ref 22–32)
Calcium: 9.6 mg/dL (ref 8.9–10.3)
Chloride: 99 mmol/L (ref 98–111)
Creatinine, Ser: 1.39 mg/dL — ABNORMAL HIGH (ref 0.61–1.24)
GFR calc Af Amer: 57 mL/min — ABNORMAL LOW (ref >60)
GFR calc non Af Amer: 50 mL/min — ABNORMAL LOW (ref >60)
Glucose, Bld: 198 mg/dL — ABNORMAL HIGH (ref 70–99)
Potassium: 4.8 mmol/L (ref 3.5–5.1)
Sodium: 136 mmol/L (ref 135–145)

## 2019-05-30 ENCOUNTER — Telehealth (HOSPITAL_COMMUNITY): Payer: Self-pay | Admitting: *Deleted

## 2019-05-30 NOTE — Telephone Encounter (Signed)
Patient called in having issues with fluid retention and 15lbs weight gain since Christmas. His PCP had recently added BP medication he cannot remember the name of but it caused fluid retention so it was changed to clonidine 0.1mg  BID. Pt now having shortness of breath and orthopnea for last few days. Pt states he has been taking lasix daily for some time as he felt as needed dosing did not work for him.  Discussed with Adline Peals PA - will increase lasix to 40mg  daily until seen by [name] as scheduled next week. Pt to call back if shortness of breath worsens before appt 2/2. Pt in agreement.

## 2019-06-04 ENCOUNTER — Encounter: Payer: Self-pay | Admitting: Cardiology

## 2019-06-04 ENCOUNTER — Other Ambulatory Visit: Payer: Self-pay

## 2019-06-04 ENCOUNTER — Ambulatory Visit (INDEPENDENT_AMBULATORY_CARE_PROVIDER_SITE_OTHER): Payer: Medicare PPO | Admitting: Cardiology

## 2019-06-04 VITALS — BP 142/70 | HR 56 | Ht 69.0 in | Wt 291.0 lb

## 2019-06-04 DIAGNOSIS — Z79899 Other long term (current) drug therapy: Secondary | ICD-10-CM

## 2019-06-04 DIAGNOSIS — I4819 Other persistent atrial fibrillation: Secondary | ICD-10-CM

## 2019-06-04 LAB — BASIC METABOLIC PANEL
BUN/Creatinine Ratio: 14 (ref 10–24)
BUN: 28 mg/dL — ABNORMAL HIGH (ref 8–27)
CO2: 25 mmol/L (ref 20–29)
Calcium: 9 mg/dL (ref 8.6–10.2)
Chloride: 101 mmol/L (ref 96–106)
Creatinine, Ser: 2.06 mg/dL — ABNORMAL HIGH (ref 0.76–1.27)
GFR calc Af Amer: 36 mL/min/{1.73_m2} — ABNORMAL LOW (ref >59)
GFR calc non Af Amer: 31 mL/min/{1.73_m2} — ABNORMAL LOW (ref >59)
Glucose: 144 mg/dL — ABNORMAL HIGH (ref 65–99)
Potassium: 4.8 mmol/L (ref 3.5–5.2)
Sodium: 141 mmol/L (ref 134–144)

## 2019-06-04 NOTE — Patient Instructions (Addendum)
Medication Instructions:  Your physician has recommended you make the following change in your medication:  1. INCREASE Lasix to 60 mg daily for 7 days, then return to normal dosing  * If you need a refill on your cardiac medications before your next appointment, please call your pharmacy.   Labwork: Today: BMET  Testing/Procedures: None ordered  Follow-Up: Your physician recommends that you schedule a follow-up appointment in: 1 week with Ricky in the AFib clinic.  Thank you for choosing [facility]!!   [name], RN [phone]  Any Other Special Instructions Will Be Listed Below (If Applicable).

## 2019-06-04 NOTE — Progress Notes (Signed)
Electrophysiology Office Note   Date:  06/04/2019   ID:  [name], DOB [birth year], MRN [id number]  PCP:  Mark Lass, Singh  Cardiologist:  Mark Singh Primary Electrophysiologist:  Mark Moronta Meredith Leeds, Singh    No chief complaint on file.    History of Present Illness: Mark Singh is a 74 y.o. male who presents today for electrophysiology evaluation.   history of PVC's, bradycardia, nephrotic syndrome (complete responder to steroid therapy by Dr. Lorrene Singh), DM, HTN, HLD, obesity, orthostatic hypotension, and mild pulm HTN felt 2/2 obesity Comes in today now s/p ablation procedure for his PVC's.  PVCs were mapped to inferior to the [facility] and successfully ablated on 03/30/15.  He subsequently went into atrial fibrillation and had ablation 12/13/2017.  He subsequently presented back to atrial fibrillation clinic in atrial flutter.  He is status post atrial flutter ablation on 02/26/2018.  Today, denies symptoms of palpitations, chest pain, shortness of breath, orthopnea, PND, claudication, dizziness, presyncope, syncope, bleeding, or neurologic sequela. The patient is tolerating medications without difficulties.  He has not noted any arrhythmias over the last few months.  Unfortunately, he has been gaining quite a bit of weight since Christmas, up to 25 pounds.  He says that his legs have gotten quite tight as has his abdomen.  He is having some lower extremity pain due to the swelling.  Past Medical History:  Diagnosis Date  . Arthritis    "hands, feet" (12/13/2017)  . Asthma   . Bradycardia    a. Repoted h/o HR in the 40s.  . Edema    a. 2D echo 11/2013: EF 27-06%, LV diastolic function parameters were normal, severely dilated LA, PASP 53mmHg..  . H/O: gout    right foot; on daily RX" (12/13/2017)  . HTN (hypertension)   . Hyperlipidemia   . Myalgia and myositis, unspecified   . Nephrotic syndrome    a. responder to steroid therapy by Dr. Lorrene Singh.   . Obesity   .  Orthostatic hypotension   . Penile cancer ([place])    "did circ"  . Pulmonary hypertension ([place])    a. Mild pulm HTN felt 2/2 obesity.  [name] PVC's (premature ventricular contractions)    a. Holter 05/2013 showing 18k PVCs (18% of the time).  . Seasonal asthma    mild  . Shoulder pain   . Squamous carcinoma    "right hand; left shoulder" (12/13/2017)  . Type 2 diabetes mellitus ([place])    Past Surgical History:  Procedure Laterality Date  . A-FLUTTER ABLATION N/A 02/23/2018   Procedure: A-FLUTTER ABLATION;  Surgeon: Mark Singh;  Location: [place] [facility];  Service: Cardiovascular;  Laterality: N/A;  . ATRIAL FIBRILLATION ABLATION N/A 12/13/2017   Procedure: ATRIAL FIBRILLATION ABLATION;  Surgeon: Mark Singh;  Location: [place] [facility];  Service: Cardiovascular;  Laterality: N/A;  . BUNIONECTOMY WITH HAMMERTOE RECONSTRUCTION Left 1992  &  2008   REMOVAL HEAL SPUR/ BUNIONECTOMY AND HAMMERTOE REPAIR   . CARDIOVERSION N/A 07/22/2015   Procedure: CARDIOVERSION;  Surgeon: Mark Spark, Singh;  Location: [place];  Service: Cardiovascular;  Laterality: N/A;  . CARDIOVERSION N/A 10/06/2015   Procedure: CARDIOVERSION;  Surgeon: Mark Margarita, Singh;  Location: [facility] ENDOSCOPY;  Service: Cardiovascular;  Laterality: N/A;  . CARDIOVERSION N/A 12/05/2016   Procedure: CARDIOVERSION;  Surgeon: Mark Casino, Singh;  Location: [facility] ENDOSCOPY;  Service: Cardiovascular;  Laterality: N/A;  . CARDIOVERSION N/A 06/29/2018   Procedure: CARDIOVERSION;  Surgeon:  Mark Singh, Mark Cheng, Singh;  Location: [facility] ENDOSCOPY;  Service: Cardiovascular;  Laterality: N/A;  . CARDIOVERSION N/A 09/20/2018   Procedure: CARDIOVERSION;  Surgeon: Mark Latch, Singh;  Location: [place];  Service: Cardiovascular;  Laterality: N/A;  . CIRCUMCISION N/A 11/27/2012   Procedure:  CIRCUMCISION  AND EXCISION OF GLANS PENIS;  Surgeon: Mark Bonine, Singh;  Location: [facility];  Service: Urology;   Laterality: N/A;  . ELECTROPHYSIOLOGIC STUDY N/A 03/30/2015   Procedure: PVC Ablation;  Surgeon: Mark Mark Meredith Leeds, Singh;  Location: [place] [facility];  Service: Cardiovascular;  Laterality: N/A;  . HERNIA REPAIR    . SQUAMOUS CELL CARCINOMA EXCISION     "right hand; left shoulder" (12/13/2017)  . UMBILICAL HERNIA REPAIR  11-04-2005     Current Outpatient Medications  Medication Sig Dispense Refill  . atorvastatin (LIPITOR) 40 MG tablet Take 40 mg by mouth daily.    . Cholecalciferol (VITAMIN D3) 5000 units CAPS Take 5,000 Units by mouth daily.     . cloNIDine (CATAPRES) 0.1 MG tablet Take 1 tablet by mouth daily.    . Cyanocobalamin (VITAMIN B12) 3000 MCG SUBL Take 3,000 mcg by mouth daily.    . flecainide (TAMBOCOR) 100 MG tablet TAKE 1 TABLET BY MOUTH TWICE A DAY 60 tablet 6  . furosemide (LASIX) 20 MG tablet Take 1 tablet (20 mg total) by mouth as needed for fluid. 30 tablet 6  . LANTUS SOLOSTAR 100 UNIT/ML Solostar Pen Inject 28 Units into the skin at bedtime.   0  . losartan (COZAAR) 100 MG tablet Take 1 tablet (100 mg total) by mouth daily. 90 tablet 2  . metFORMIN (GLUCOPHAGE) 1000 MG tablet Take 1,000 mg by mouth daily.   0  . metoprolol succinate (TOPROL-XL) 25 MG 24 hr tablet TAKE 1 TABLET (25 MG TOTAL) BY MOUTH DAILY. TAKE WITH OR IMMEDIATELY FOLLOWING A MEAL. 90 tablet 3  . NON FORMULARY Take by mouth.    [name] omeprazole (PRILOSEC) 20 MG capsule Take 20 mg by mouth daily.    Mark Singh 92E MISC USE AS DIRECTED EVERY DAY    . Mark Singh test strip USE AS DIRECTED (TEST DAILY) 90 **E11.9    . pimecrolimus (ELIDEL) 1 % cream Apply 1 application topically 2 (two) times daily as needed (psoriasis).     . probenecid (BENEMID) 500 MG tablet Take 500 mg by mouth daily.     . tamsulosin (FLOMAX) 0.4 MG CAPS capsule Take 0.4 mg by mouth every evening.   4  . WIXELA INHUB 100-50 MCG/DOSE AEPB Inhale 2 puffs into the lungs daily as needed.    Mark Singh 20 MG TABS tablet  TAKE 1 TABLET (20 MG TOTAL) BY MOUTH DAILY WITH SUPPER. 90 tablet 1   No current facility-administered medications for this visit.    Allergies:   Indomethacin   Social History:  The patient  reports that he has never smoked. He has never used smokeless tobacco. He reports that he does not drink alcohol or use drugs.   Family History:  The patient's family history includes CVA in his father; Coronary artery disease in his mother.   ROS:  Please see the history of present illness.   Otherwise, review of systems is positive for none.   All other systems are reviewed and negative.   PHYSICAL EXAM: VS:  BP (!) 142/70   Pulse (!) 56   Ht 5\' 9"  (1.753 m)   Wt 291 lb (132  kg)   SpO2 94%   BMI 42.97 kg/m  , BMI Body mass index is 42.97 kg/m. GEN: Well nourished, well developed, in no acute distress  HEENT: normal  Neck: no JVD, carotid bruits, or masses Cardiac: RRR; no murmurs, rubs, or gallops, 2+ lower extremity edema above the knee  Respiratory:  clear to auscultation bilaterally, normal work of breathing GI: soft, nontender, nondistended, + BS MS: no deformity or atrophy  Skin: warm and dry Neuro:  Strength and sensation are intact Psych: euthymic mood, full affect  EKG:  EKG is ordered today. Personal review of the ekg ordered shows this rhythm, rate 56  Recent Labs: 10/01/2018: Hemoglobin 11.0; Platelets 227 03/01/2019: BUN 18; Creatinine, Ser 1.39; Potassium 4.8; Sodium 136    Lipid Panel  No results found for: CHOL, TRIG, HDL, CHOLHDL, VLDL, LDLCALC, LDLDIRECT   Wt Readings from Last 3 Encounters:  06/04/19 291 lb (132 kg)  02/14/19 272 lb 6.4 oz (123.6 kg)  11/06/18 273 lb (123.8 kg)      Other studies Reviewed: Additional studies/ records that were reviewed today include: TTE 12/28/14  Review of the above records today demonstrates:  - Left ventricle: The cavity size was normal. There was mild concentric hypertrophy. Systolic function was normal.  The estimated ejection fraction was in the range of 55% to 60%. Although no diagnostic regional wall motion abnormality was identified, this possibility cannot be completely excluded on the basis of this study. Left ventricular diastolic function parameters were normal. - Left atrium: The atrium was severely dilated. - Pulmonary arteries: Systolic pressure was mildly increased. PA peak pressure: 38 mm Hg (S).   ASSESSMENT AND PLAN:  1.  PVCs: Status post ablation 03/22/2015.  No recurrence.    2.  Persistent atrial fibrillation: Currently on Xarelto, flecainide, metoprolol.  Status post ablation 12/13/2017.  Unfortunately he has had more episodes of atrial fibrillation.  CHA2DS2-VASc of 3.  Urgently he remains in sinus rhythm   3.  Typical atrial flutter: Status post ablation 02/26/2018 without recurrence.  4.  Lower extremity edema with volume overload: I told him to increase his Lasix to 60 mg a day.  He has quite a bit of lower extremity edema.  He [facility] To[facility] do this for at least a week.  I Gabryella Murfin have him follow-up with Ricky in the A. fib clinic for further assessment of his lower extremity edema.  We Saud Bail check a BMP to check his kidney function and potassium.  Current medicines are reviewed at length with the patient today.   The patient does not have concerns regarding his medicines.  The following changes were made today: none  Labs/ tests ordered today include:  Orders Placed This Encounter  Procedures  . Basic metabolic panel  . EKG 12-Lead     Disposition:   FU with Jaylynn Mcaleer 3 months  Signed, Lakisha Peyser Meredith Leeds, Singh  06/04/2019 10:07 AM     [facility] [address] [place] [address] [phone] (office) [phone] (fax)

## 2019-06-04 NOTE — Addendum Note (Signed)
Addended by: Marciano Sequin on: 06/04/2019 10:15 AM   Modules accepted: Orders

## 2019-06-07 ENCOUNTER — Telehealth: Payer: Self-pay

## 2019-06-07 NOTE — Telephone Encounter (Signed)
The patient has been notified of the result and verbalized understanding.  All questions (if any) were answered. Pt aware to follow up with his PCP. Will forward labs to Dr. Saddie Benders PCP. Wilma Flavin, RN 06/07/2019 2:15 PM

## 2019-06-07 NOTE — Telephone Encounter (Signed)
-----   Message from [name], MD sent at 06/05/2019  8:39 AM EST ----- Creatinine significantly elevated. Needs to discuss with PCP for workup.

## 2019-06-11 ENCOUNTER — Other Ambulatory Visit: Payer: Self-pay

## 2019-06-11 ENCOUNTER — Ambulatory Visit (HOSPITAL_COMMUNITY)
Admission: RE | Admit: 2019-06-11 | Discharge: 2019-06-11 | Disposition: A | Discharge status: Home / Self Care | Payer: Medicare PPO | Source: Ambulatory Visit | Attending: Physician Assistant | Admitting: Physician Assistant

## 2019-06-11 ENCOUNTER — Encounter (HOSPITAL_COMMUNITY): Payer: Self-pay | Admitting: Physician Assistant

## 2019-06-11 VITALS — BP 110/50 | HR 52 | Ht 69.0 in | Wt 291.2 lb

## 2019-06-11 DIAGNOSIS — E118 Type 2 diabetes mellitus with unspecified complications: Secondary | ICD-10-CM | POA: Insufficient documentation

## 2019-06-11 DIAGNOSIS — E039 Hypothyroidism, unspecified: Secondary | ICD-10-CM | POA: Diagnosis not present

## 2019-06-11 DIAGNOSIS — E8770 Fluid overload, unspecified: Secondary | ICD-10-CM | POA: Diagnosis not present

## 2019-06-11 DIAGNOSIS — R600 Localized edema: Secondary | ICD-10-CM | POA: Insufficient documentation

## 2019-06-11 DIAGNOSIS — Z6841 Body Mass Index (BMI) 40.0 and over, adult: Secondary | ICD-10-CM | POA: Insufficient documentation

## 2019-06-11 DIAGNOSIS — I4892 Unspecified atrial flutter: Secondary | ICD-10-CM | POA: Diagnosis not present

## 2019-06-11 DIAGNOSIS — G4733 Obstructive sleep apnea (adult) (pediatric): Secondary | ICD-10-CM | POA: Insufficient documentation

## 2019-06-11 DIAGNOSIS — E785 Hyperlipidemia, unspecified: Secondary | ICD-10-CM | POA: Insufficient documentation

## 2019-06-11 DIAGNOSIS — E669 Obesity, unspecified: Secondary | ICD-10-CM | POA: Insufficient documentation

## 2019-06-11 DIAGNOSIS — I4819 Other persistent atrial fibrillation: Secondary | ICD-10-CM

## 2019-06-11 DIAGNOSIS — Z79899 Other long term (current) drug therapy: Secondary | ICD-10-CM | POA: Insufficient documentation

## 2019-06-11 DIAGNOSIS — Z7901 Long term (current) use of anticoagulants: Secondary | ICD-10-CM | POA: Diagnosis not present

## 2019-06-11 DIAGNOSIS — D6869 Other thrombophilia: Secondary | ICD-10-CM | POA: Insufficient documentation

## 2019-06-11 DIAGNOSIS — I10 Essential (primary) hypertension: Secondary | ICD-10-CM | POA: Insufficient documentation

## 2019-06-11 LAB — BASIC METABOLIC PANEL
Anion gap: 11 (ref 5–15)
BUN: 31 mg/dL — ABNORMAL HIGH (ref 8–23)
CO2: 28 mmol/L (ref 22–32)
Calcium: 9 mg/dL (ref 8.9–10.3)
Chloride: 103 mmol/L (ref 98–111)
Creatinine, Ser: 2.37 mg/dL — ABNORMAL HIGH (ref 0.61–1.24)
GFR calc Af Amer: 30 mL/min — ABNORMAL LOW (ref >60)
GFR calc non Af Amer: 26 mL/min — ABNORMAL LOW (ref >60)
Glucose, Bld: 151 mg/dL — ABNORMAL HIGH (ref 70–99)
Potassium: 4.8 mmol/L (ref 3.5–5.1)
Sodium: 142 mmol/L (ref 135–145)

## 2019-06-11 MED ORDER — METOPROLOL SUCCINATE ER 25 MG PO TB24: 12.5000 mg | ORAL_TABLET | Freq: Every day | ORAL | 3 refills | Status: DC

## 2019-06-11 MED ORDER — LOSARTAN POTASSIUM 100 MG PO TABS: 50.0000 mg | ORAL_TABLET | Freq: Every day | ORAL | 2 refills | Status: DC

## 2019-06-11 NOTE — Patient Instructions (Signed)
Medication changes per your primary care

## 2019-06-11 NOTE — Progress Notes (Addendum)
Primary Care Physician: Kathyrn Lass, MD Primary Cardiologist: Dr Marlou Porch Primary Electrophysiologist: Dr Curt Bears Referring Physician: Dr Melanee Left is a 74 y.o. male with a history of persistent atrial fibrillation who presents for follow up in the [facility]. Patient reports doing reasonably well since his last visit. He states that his SOB has persisted since starting flecainide. He did not feel much different in rhythm. He does still try to walk on a regular basis. No symptoms of palpitations or heart racing. S/p DCCV 09/20/18. He reports that he had some bradycardia and dizziness. His metoprolol was decreased. On 09/28/18 he was at his PCP office and he reports fluid in his abdomen and legs with increased SOB. Started on Lasix with symptomatic relief. He is on Xarelto for a CHADS2VASC score of 3.  On follow up today, patient reports that he has had increased lower extremity and abdominal edema. He has gained ~20 lbs. He was seen by Dr Curt Bears who increased his Lasix to 60 mg daily but patient's weight is unchanged. He has symptoms of fatigue and dyspnea with exertion. No orthopnea. He also has lightheadedness with exertion. He has not had any afib noted on his Apple Watch or on ECGs.   Today, he denies symptoms of palpitations, chest pain, orthopnea, PND, dizziness, presyncope, syncope, bleeding, or neurologic sequela. The patient is tolerating medications without difficulties and is otherwise without complaint today.    Atrial Fibrillation Risk Factors:  he does not have symptoms or diagnosis of sleep apnea. he does not have a history of rheumatic fever. he does not have a history of alcohol use.  he has a BMI of Body mass index is 43 kg/m.[name] Filed Weights   06/11/19 1006  Weight: 132.1 kg    Family History  Problem Relation Age of Onset  . Coronary artery disease Mother   . CVA Father      Atrial Fibrillation Management  history:  Previous antiarrhythmic drugs: flecainide Previous cardioversion: several, most recently 09/20/18 Previous ablations: 11/2017, 01/2018 CHADS2VASC score: 3 Anticoagulation history: Xarelto   Past Medical History:  Diagnosis Date  . Arthritis    "hands, feet" (12/13/2017)  . Asthma   . Bradycardia    a. Repoted h/o HR in the 40s.  . Edema    a. 2D echo 11/2013: EF 76-16%, LV diastolic function parameters were normal, severely dilated LA, PASP 64mmHg..  . H/O: gout    right foot; on daily RX" (12/13/2017)  . HTN (hypertension)   . Hyperlipidemia   . Myalgia and myositis, unspecified   . Nephrotic syndrome    a. responder to steroid therapy by Dr. Lorrene Reid.   . Obesity   . Orthostatic hypotension   . Penile cancer ([place])    "did circ"  . Pulmonary hypertension ([place])    a. Mild pulm HTN felt 2/2 obesity.  [name] PVC's (premature ventricular contractions)    a. Holter 05/2013 showing 18k PVCs (18% of the time).  . Seasonal asthma    mild  . Shoulder pain   . Squamous carcinoma    "right hand; left shoulder" (12/13/2017)  . Type 2 diabetes mellitus ([place])    Past Surgical History:  Procedure Laterality Date  . A-FLUTTER ABLATION N/A 02/23/2018   Procedure: A-FLUTTER ABLATION;  Surgeon: Constance Haw, MD;  Location: [place] [facility];  Service: Cardiovascular;  Laterality: N/A;  . ATRIAL FIBRILLATION ABLATION N/A 12/13/2017   Procedure: ATRIAL FIBRILLATION ABLATION;  Surgeon: Constance Haw, MD;  Location: [place] [facility];  Service: Cardiovascular;  Laterality: N/A;  . BUNIONECTOMY WITH HAMMERTOE RECONSTRUCTION Left 1992  &  2008   REMOVAL HEAL SPUR/ BUNIONECTOMY AND HAMMERTOE REPAIR   . CARDIOVERSION N/A 07/22/2015   Procedure: CARDIOVERSION;  Surgeon: Dorothy Spark, MD;  Location: [place];  Service: Cardiovascular;  Laterality: N/A;  . CARDIOVERSION N/A 10/06/2015   Procedure: CARDIOVERSION;  Surgeon: Sueanne Margarita, MD;  Location: [place];  Service:  Cardiovascular;  Laterality: N/A;  . CARDIOVERSION N/A 12/05/2016   Procedure: CARDIOVERSION;  Surgeon: Pixie Casino, MD;  Location: [facility] END[facility];  Service: Cardiovascular;  Laterality: N/A;  . CARDIOVERSION N/A 06/29/2018   Procedure: CARDIOVERSION;  Surgeon: Thayer Headings, MD;  Location[facility] ENDOSCOPY;  Service: Cardiovascular;  Laterality: N/A;  . CARDIOVERSION N/A 09/20/2018   Procedure: CARDIOVERSION;  Surgeon: [name], MD;  Location: [place];  Service: Cardiovascular;  Laterality: N/A;  . CIRCUMCISION N/A 11/27/2012   Procedure:  CIRCUMCISION  AND EXCISION OF GLANS PENIS;  Surgeon: Fredricka Bonine, MD;  Location: [facility];  Service: Urology;  Laterality: N/A;  . ELECTROPHYSIOLOGIC STUDY N/A 03/30/2015   Procedure: PVC Ablation;  Surgeon: [name], MD;  Location: [place] [facility];  Service: Cardiovascular;  Laterality: N/A;  . HERNIA REPAIR    . SQUAMOUS CELL CARCINOMA EXCISION     "right hand; left shoulder" (12/13/2017)  . UMBILICAL HERNIA REPAIR  11-04-2005    Current Outpatient Medications  Medication Sig Dispense Refill  . atorvastatin (LIPITOR) 40 MG tablet Take 40 mg by mouth daily.    . Cholecalciferol (VITAMIN D3) 5000 units CAPS Take 5,000 Units by mouth daily.     . cloNIDine (CATAPRES) 0.1 MG tablet Take 1 tablet by mouth 2 (two) times daily.     . Cyanocobalamin (VITAMIN B12) 3000 MCG SUBL Take 3,000 mcg by mouth daily.    . flecainide (TAMBOCOR) 100 MG tablet TAKE 1 TABLET BY MOUTH TWICE A DAY 60 tablet 6  . furosemide (LASIX) 20 MG tablet Take 1 tablet (20 mg total) by mouth as needed for fluid. 30 tablet 6  . LANTUS SOLOSTAR 100 UNIT/ML Solostar Pen Inject 28 Units into the skin at bedtime.   0  . losartan (COZAAR) 100 MG tablet Take 0.5 tablets (50 mg total) by mouth daily. 90 tablet 2  . metoprolol succinate (TOPROL-XL) 25 MG 24 hr tablet Take 0.5 tablets (12.5 mg total) by mouth daily. 90 tablet 3  . omeprazole  (PRILOSEC) 20 MG capsule Take 20 mg by mouth daily.    Glory Rosebush Delica Lancets 01V MISC USE AS DIRECTED EVERY DAY    . ONETOUCH VERIO test strip USE AS DIRECTED (TEST DAILY) 90 **E11.9    . pimecrolimus (ELIDEL) 1 % cream Apply 1 application topically 2 (two) times daily as needed (psoriasis).     . probenecid (BENEMID) 500 MG tablet Take 500 mg by mouth daily.     . tamsulosin (FLOMAX) 0.4 MG CAPS capsule Take 0.4 mg by mouth every evening.   4  . WIXELA INHUB 100-50 MCG/DOSE AEPB Inhale 2 puffs into the lungs daily as needed.    Alveda Reasons 20 MG TABS tablet TAKE 1 TABLET (20 MG TOTAL) BY MOUTH DAILY WITH SUPPER. 90 tablet 1   No current facility-administered medications for this encounter.    Allergies  Allergen Reactions  . Indomethacin Other (See Comments)    dizziness  Social History   Socioeconomic History  . Marital status: Widowed    Spouse name: Not on file  . Number of children: Not on file  . Years of education: Not on file  . Highest education level: Not on file  Occupational History  . Not on file  Tobacco Use  . Smoking status: Never Smoker  . Smokeless tobacco: Never Used  Substance and Sexual Activity  . Alcohol use: Never  . Drug use: Never  . Sexual activity: Not Currently  Other Topics Concern  . Not on file  Social History Narrative  . Not on file   Social Determinants of Health   Financial Resource Strain:   . Difficulty of Paying Living Expenses: Not on file  Food Insecurity:   . Worried About Charity fundraiser in the Last Year: Not on file  . Ran Out of Food in the Last Year: Not on file  Transportation Needs:   . Lack of Transportation (Medical): Not on file  . Lack of Transportation (Non-Medical): Not on file  Physical Activity:   . Days of Exercise per Week: Not on file  . Minutes of Exercise per Session: Not on file  Stress:   . Feeling of Stress : Not on file  Social Connections:   . Frequency of Communication with Friends and  Family: Not on file  . Frequency of Social Gatherings with Friends and Family: Not on file  . Attends Religious Services: Not on file  . Active Member of Clubs or Organizations: Not on file  . Attends Archivist Meetings: Not on file  . Marital Status: Not on file  Intimate Partner Violence:   . Fear of Current or Ex-Partner: Not on file  . Emotionally Abused: Not on file  . Physically Abused: Not on file  . Sexually Abused: Not on file     ROS- All systems are reviewed and negative except as per the HPI above.  Physical Exam: Vitals:   06/11/19 1006  BP: (!) 110/50  Pulse: (!) 52  SpO2: 95%  Weight: 132.1 kg  Height: 5\' 9"  (1.753 m)    GEN- The patient is well appearing obese elderly male, alert and oriented x 3 today.   HEENT-head normocephalic, atraumatic, sclera clear, conjunctiva pink, hearing intact, trachea midline. Lungs- Clear to ausculation bilaterally, normal work of breathing Heart- Regular rhythm, bradycardia, no murmurs, rubs or gallops  GI- soft, NT, ND, + BS Extremities- no clubbing, cyanosis. 2+ bilateral edema MS- no significant deformity or atrophy Skin- no rash or lesion Psych- euthymic mood, full affect Neuro- strength and sensation are intact   Wt Readings from Last 3 Encounters:  06/11/19 132.1 kg  06/04/19 132 kg  02/14/19 123.6 kg    EKG today demonstrates SB HR 52, QRS 116, QTc 465  Echo 11/2013 demonstrated  - Left ventricle: The cavity size was normal. There was mild concentric hypertrophy. Systolic function was normal. The estimated ejection fraction was in the range of 55% to 60%. Although no diagnostic regional wall motion abnormality was identified, this possibility cannot be completely excluded on the basis of this study. Left ventricular diastolic function parameters were normal. - Left atrium: The atrium was severely dilated. - Pulmonary arteries: Systolic pressure was mildly increased. PA peak pressure:  38 mm Hg (S).  Epic records are reviewed at length today  Assessment and Plan:  1. Persistent atrial fibrillation/tpical atrial flutter S/p DCCV 09/20/18. Patient appears to be maintaining SR.  Continue flecainide  100 mg BID. Continue metoprolol 12.5 mg daily Continue Xarelto 20 mg daily Continue monitoring with Apple Watch Lifestyle changes as below.   This patients CHA2DS2-VASc Score and unadjusted Ischemic Stroke Rate (% per year) is equal to 3.2 % stroke rate/year from a score of 3  Above score calculated as 1 point each if present [CHF, HTN, DM, Vascular=MI/PAD/Aortic Plaque, Age if 65-74, or Male] Above score calculated as 2 points each if present [Age > 75, or Stroke/TIA/TE]  2. Obesity Body mass index is 43 kg/m. Lifestyle modification was discussed and encouraged including regular physical activity and weight reduction.  3. HTN Stable, no changes today.  4. OSA The importance of adequate treatment of sleep apnea was discussed today in order to improve our ability to maintain sinus rhythm long term. Patient reports compliance with CPAP therapy. Followed by Dr Isidoro Donning.  5. Lower extremity edema/fluid overload Patient had minimal repose to higher dose of Lasix. Cr increased to 2.13 at PCP office yesterday. Will order echocardiogram.  Agree with PCP plan to decrease losartan and change to torsemide. Check Bmet. Refer to nephrology per PCP. Does not appear related to his afib.  6. Hypothyroidism TSH 7.42 Plans per PCP   Follow up next week with Dr Marlou Porch or general cardiology APP.   [facility] [address] [place], [place] [address] [phone] 06/11/2019 11:33 AM

## 2019-06-14 NOTE — Progress Notes (Signed)
Cardiology Office Note    Date:  06/17/2019   ID:  Mark Singh, DOB February 23, 1946, MRN [id number]  PCP:  Kathyrn Lass, MD  Cardiologist: Dr. Marlou Porch Electrophysiologist: [name]  Chief Complaint: Lower extremity edema  History of Present Illness:   Mark Singh is a 74 y.o. male persistent atrial fibrillation/atrial flutter post ablation in 2019, hypertension, lipidemia, ablation, obstructive sleep apnea on CPAP, hypothyroidism, diabetes and morbid obesity presents for fluid overload evaluation.  History of longstanding atrial fibrillation with multiple cardioversions, last May 2020.  Maintaining sinus rhythm on flecainide and metoprolol.  Anticoagulated with Xarelto.  Monitoring rhythm on Apple Watch.  Recently noted increased lower extremity edema with abdominal distention.  Gained approximately 20 pounds.  No improvement on Lasix>> changed to torsemide.  Reduce losartan due to worsening renal function.  Pending nephrology evaluation.  Here today for follow-up.  His weight remains around 290 pounds.  However his weight was 273 pounds July 2020.  He has abdominal distention, lower extremity edema, shortness of breath, orthopnea and PND.  May be eating high salt diet.  Denies chest pain, palpitation or melena.  Compliant with CPAP and medications.    Past Medical History:  Diagnosis Date  . Arthritis    "hands, feet" (12/13/2017)  . Asthma   . Bradycardia    a. Repoted h/o HR in the 40s.  . Edema    a. 2D echo 11/2013: EF 94-85%, LV diastolic function parameters were normal, severely dilated LA, PASP 73mmHg..  . H/O: gout    right foot; on daily RX" (12/13/2017)  . HTN (hypertension)   . Hyperlipidemia   . Myalgia and myositis, unspecified   . Nephrotic syndrome    a. responder to steroid therapy by Dr. Lorrene Reid.   . Obesity   . Orthostatic hypotension   . Penile cancer ([place])    "did circ"  . Pulmonary hypertension ([place])    a. Mild pulm HTN felt 2/2  obesity.  [name] PVC's (premature ventricular contractions)    a. Holter 05/2013 showing 18k PVCs (18% of the time).  . Seasonal asthma    mild  . Shoulder pain   . Squamous carcinoma    "right hand; left shoulder" (12/13/2017)  . Type 2 diabetes mellitus ([place])     Past Surgical History:  Procedure Laterality Date  . A-FLUTTER ABLATION N/A 02/23/2018   Procedure: A-FLUTTER ABLATION;  Surgeon: Constance Haw, MD;  Location: [facility];  Service: Cardiovascular;  Laterality: N/A;  . ATRIAL FIBRILLATION ABLATION N/A 12/13/2017   Procedure: ATRIAL FIBRILLATION ABLATION;  Surgeon: Constance Haw, MD;  Location: [facility];  Service: Cardiovascular;  Laterality: N/A;  . BUNIONECTOMY WITH HAMMERTOE RECONSTRUCTION Left 1992  &  2008   REMOVAL HEAL SPUR/ BUNIONECTOMY AND HAMMERTOE REPAIR   . CARDIOVERSION N/A 07/22/2015   Procedure: CARDIOVERSION;  Surgeon: Dorothy Spark, MD;  Location: [facility];  Service: Cardiovascular;  Laterality: N/A;  . CARDIOVERSION N/A 10/06/2015   Procedure: CARDIOVERSION;  Surgeon: Sueanne Margarita, MD;  Location: [facility] ENDOSCOPY;  Service: Cardiovascular;  Laterality: N/A;  . CARDIOVERSION N/A 12/05/2016   Procedure: CARDIOVERSION;  Surgeon: Pixie Casino, MD;  Location[facility] ENDOSCOPY;  Service: Cardiovascular;  Laterality: N/A;  . CARDIOVERSION N/A 06/29/2018   Procedure: CARDIOVERSION;  Surgeon: Thayer Headings, MD;  Location: [facility] ENDOSCOPY;  Service: Cardiovascular;  Laterality: N/A;  . CARDIOVERSION N/A 09/20/2018   Procedure: CARDIOVERSION;  Surgeon: [name], MD;  Location: [facility]  ENDOSCOPY;  Service: Cardiovascular;  Laterality: N/A;  . CIRCUMCISION N/A 11/27/2012   Procedure:  CIRCUMCISION  AND EXCISION OF GLANS PENIS;  Surgeon: Fredricka Bonine, MD;  Location: [facility];  Service: Urology;  Laterality: N/A;  . ELECTROPHYSIOLOGIC STUDY N/A 03/30/2015   Procedure: PVC Ablation;  Surgeon: [name], MD;   Location: [facility];  Service: Cardiovascular;  Laterality: N/A;  . HERNIA REPAIR    . SQUAMOUS CELL CARCINOMA EXCISION     "right hand; left shoulder" (12/13/2017)  . UMBILICAL HERNIA REPAIR  11-04-2005    Current Medications: Prior to Admission medications   Medication Sig Start Date End Date Taking? Authorizing Provider  atorvastatin (LIPITOR) 40 MG tablet Take 40 mg by mouth daily.   Yes [provider]  Cholecalciferol (VITAMIN D3) 5000 units CAPS Take 5,000 Units by mouth daily.    Yes [provider]  ciprofloxacin (CIPRO) 250 MG tablet Take 250 mg by mouth 2 (two) times daily.   Yes [provider]  cloNIDine (CATAPRES) 0.1 MG tablet Take 1 tablet by mouth 2 (two) times daily.  05/13/19  Yes [provider]  Cyanocobalamin (VITAMIN B12) 3000 MCG SUBL Take 3,000 mcg by mouth daily.   Yes [provider]  flecainide (TAMBOCOR) 100 MG tablet TAKE 1 TABLET BY MOUTH TWICE A DAY 01/24/19  Yes Fenton, Clint R, PA  LANTUS SOLOSTAR 100 UNIT/ML Solostar Pen Inject 40 Units into the skin at bedtime.  02/08/18  Yes [provider]  losartan (COZAAR) 100 MG tablet Take 0.5 tablets (50 mg total) by mouth daily. 06/11/19  Yes Fenton, Clint R, PA  metoprolol succinate (TOPROL-XL) 25 MG 24 hr tablet Take 0.5 tablets (12.5 mg total) by mouth daily. 06/11/19  Yes Fenton, Clint R, PA  omeprazole (PRILOSEC) 20 MG capsule Take 20 mg by mouth daily. 06/25/18  Yes [provider]  pimecrolimus (ELIDEL) 1 % cream Apply 1 application topically 2 (two) times daily as needed (psoriasis).    Yes [provider]  probenecid (BENEMID) 500 MG tablet Take 500 mg by mouth daily.    Yes [provider]  tamsulosin (FLOMAX) 0.4 MG CAPS capsule Take 0.4 mg by mouth every evening.  03/26/16  Yes [provider]  torsemide (DEMADEX) 20 MG tablet Take 20 mg by mouth 3 (three) times daily.  06/11/19  Yes [provider]  Grant Ruts  INHUB 100-50 MCG/DOSE AEPB Inhale 2 puffs into the lungs daily as needed. 09/28/18  Yes [provider]  XARELTO 20 MG TABS tablet TAKE 1 TABLET (20 MG TOTAL) BY MOUTH DAILY WITH SUPPER. 01/08/19  Yes Jerline Pain, MD  OneTouch Delica Lancets 19E MISC USE AS DIRECTED EVERY DAY 12/16/18   [provider]  ONETOUCH VERIO test strip USE AS DIRECTED (TEST DAILY) 90 **E11.9 01/20/19   [provider]  Allergies:   Indomethacin   Social History   Socioeconomic History  . Marital status: Widowed    Spouse name: Not on file  . Number of children: Not on file  . Years of education: Not on file  . Highest education level: Not on file  Occupational History  . Not on file  Tobacco Use  . Smoking status: Never Smoker  . Smokeless tobacco: Never Used  Substance and Sexual Activity  . Alcohol use: Never  . Drug use: Never  . Sexual activity: Not Currently  Other Topics Concern  . Not on file  Social History  Narrative  . Not on file   Social Determinants of Health   Financial Resource Strain:   . Difficulty of Paying Living Expenses: Not on file  Food Insecurity:   . Worried About Charity fundraiser in the Last Year: Not on file  . Ran Out of Food in the Last Year: Not on file  Transportation Needs:   . Lack of Transportation (Medical): Not on file  . Lack of Transportation (Non-Medical): Not on file  Physical Activity:   . Days of Exercise per Week: Not on file  . Minutes of Exercise per Session: Not on file  Stress:   . Feeling of Stress : Not on file  Social Connections:   . Frequency of Communication with Friends and Family: Not on file  . Frequency of Social Gatherings with Friends and Family: Not on file  . Attends Religious Services: Not on file  . Active Member of Clubs or Organizations: Not on file  . Attends Archivist Meetings: Not on file  . Marital Status: Not on file     Family History:  The patient's family history includes CVA in  his father; Coronary artery disease in his mother.   ROS:   Please see the history of present illness.    ROS All other systems reviewed and are negative.   PHYSICAL EXAM:   VS:  BP 128/62   Pulse (!) 56   Ht 5\' 9"  (1.753 m)   Wt 290 lb (131.5 kg)   BMI 42.83 kg/m    GEN: Well nourished, well developed, in no acute distress  HEENT: normal  Neck: no  carotid bruits, or masses, positive JVD Cardiac:RRR; no murmurs, rubs, or gallops, 3+ bilateral lower extremity edema extending to his abdomen Respiratory:  clear to auscultation bilaterally, normal work of breathing GI: soft, nontender, + distended, + BS MS: no deformity or atrophy  Skin: warm and dry, no rash Neuro:  Alert and Oriented x 3, Strength and sensation are intact Psych: euthymic mood, full affect  Wt Readings from Last 3 Encounters:  06/17/19 290 lb (131.5 kg)  06/11/19 291 lb 3.2 oz (132.1 kg)  06/04/19 291 lb (132 kg)     Studies/Labs Reviewed:   EKG:  EKG is not  ordered today.   Recent Labs: 10/01/2018: Hemoglobin 11.0; Platelets 227 06/11/2019: BUN 31; Creatinine, Ser 2.37; Potassium 4.8; Sodium 142   Lipid Panel No results found for: CHOL, TRIG, HDL, CHOLHDL, VLDL, LDLCALC, LDLDIRECT  Additional studies/ records that were reviewed today include:   As summarized above  ASSESSMENT & PLAN:    1. LE edema with abdominal distention - Pending echocardiogram tomorrow.  He has approximately 15 to 20 pounds of fluids.  Weight was 273 pounds 6 months ago.  Current weight 290 pounds.  Get CMP and BNP.  Change torsemide 20 mg 3 times a day to 60 mg daily.  Pending nephrology evaluation (referal made by PCP). Education given for [employer].  2. Persistent atrial fibrillation/typical a.flutter s/p ablation  - CHADSVASCs score of 3. On Xaralto. - On flecainide and metoprolol  3. PVC s/p ablation  - No occurrence   4. OSA on CPAP - Compliant   5. HTN - BP stable on current medications  6. DM - Per  PCP  Medication Adjustments/Labs and Tests Ordered: Current medicines are reviewed at length with the patient today.  Concerns regarding medicines are outlined above.  Medication changes, Labs and Tests ordered today are listed in  the Patient Instructions below. Patient Instructions  Medication Instructions:  Your physician has recommended you make the following change in your medication:  TAKE Your Torsemide (Demadex) 60 mg (3 pills) every morning *If you need a refill on your cardiac medications before your next appointment, please call your pharmacy*  Lab Work: TODAY - pro-BNP, Liver panel, BMET If you have labs (blood work) drawn today and your tests are completely normal, you will receive your results only by: [name] MyChart Message (if you have MyChart) OR . A paper copy in the mail If you have any lab test that is abnormal or we need to change your treatment, we will call you to review the results.   Testing/Procedures: Return for your echocardiogram tomorrow (Tuesday Feb. 16) at 9:50 am   Follow-Up: At [facility], you and your health needs are our priority.  As part of our continuing mission to provide you with exceptional heart care, we have created designated Provider Care Teams.  These Care Teams include your primary Cardiologist (physician) and Advanced Practice Providers (APPs -  Physician Assistants and Nurse Practitioners) who all work together to provide you with the care you need, when you need it.  Your next appointment:   Monday March 1 at 3:15  The format for your next appointment:   In Person  Provider:   Robbie Lis, PA  Other Instructions  [place] stands for "Dietary Approaches to Stop Hypertension." The DASH eating plan is a healthy eating plan that has been shown to reduce high blood pressure (hypertension). It may also reduce your risk for type 2 diabetes, heart disease, and stroke. The DASH eating plan may also help with weight loss. What  are tips for following this plan?  General guidelines  Avoid eating more than 2,300 mg (milligrams) of salt (sodium) a day. If you have hypertension, you may need to reduce your sodium intake to 1,500 mg a day.  Limit alcohol intake to no more than 1 drink a day for nonpregnant women and 2 drinks a day for men. One drink equals 12 oz of beer, 5 oz of wine, or 1 oz of hard liquor.  Work with your health care provider to maintain a healthy body weight or to lose weight. Ask what an ideal weight is for you.  Get at least 30 minutes of exercise that causes your heart to beat faster (aerobic exercise) most days of the week. Activities may include walking, swimming, or biking.  Work with your health care provider or diet and nutrition specialist (dietitian) to adjust your eating plan to your individual calorie needs. Reading food labels   Check food labels for the amount of sodium per serving. Choose foods with less than 5 percent of the Daily Value of sodium. Generally, foods with less than 300 mg of sodium per serving fit into this eating plan.  To find whole grains, look for the word "whole" as the first word in the ingredient list. Shopping  Buy products labeled as "low-sodium" or "no salt added."  Buy fresh foods. Avoid canned foods and premade or frozen meals. Cooking  Avoid adding salt when cooking. Use salt-free seasonings or herbs instead of table salt or sea salt. Check with your health care provider or pharmacist before using salt substitutes.  Do not fry foods. Cook foods using healthy methods such as baking, boiling, grilling, and broiling instead.  Cook with heart-healthy oils, such as olive, canola, soybean, or sunflower oil. Meal planning  Eat  a balanced diet that includes: ? 5 or more servings of fruits and vegetables each day. At each meal, try to fill half of your plate with fruits and vegetables. ? Up to 6-8 servings of whole grains each day. ? Less than 6 oz of  lean meat, poultry, or fish each day. A 3-oz serving of meat is about the same size as a deck of cards. One egg equals 1 oz. ? 2 servings of low-fat dairy each day. ? A serving of nuts, seeds, or beans 5 times each week. ? Heart-healthy fats. Healthy fats called Omega-3 fatty acids are found in foods such as flaxseeds and coldwater fish, like sardines, salmon, and mackerel.  Limit how much you eat of the following: ? Canned or prepackaged foods. ? Food that is high in trans fat, such as fried foods. ? Food that is high in saturated fat, such as fatty meat. ? Sweets, desserts, sugary drinks, and other foods with added sugar. ? Full-fat dairy products.  Do not salt foods before eating.  Try to eat at least 2 vegetarian meals each week.  Eat more home-cooked food and less restaurant, buffet, and fast food.  When eating at a restaurant, ask that your food be prepared with less salt or no salt, if possible. What foods are recommended? The items listed may not be a complete list. Talk with your dietitian about what dietary choices are best for you. Grains Whole-grain or whole-wheat bread. Whole-grain or whole-wheat pasta. Brown rice. [place]. Bulgur. Whole-grain and low-sodium cereals. Pita bread. Low-fat, low-sodium crackers. Whole-wheat flour tortillas. Vegetables Fresh or frozen vegetables (raw, steamed, roasted, or grilled). Low-sodium or reduced-sodium tomato and vegetable juice. Low-sodium or reduced-sodium tomato sauce and tomato paste. Low-sodium or reduced-sodium canned vegetables. Fruits All fresh, dried, or frozen fruit. Canned fruit in natural juice (without added sugar). Meat and other protein foods Skinless chicken or [place]. Ground chicken or [place]. Pork with fat trimmed off. Fish and seafood. Egg whites. Dried beans, peas, or lentils. Unsalted nuts, nut butters, and seeds. Unsalted canned beans. Lean cuts of beef with fat trimmed off. Low-sodium, lean deli  meat. Dairy Low-fat (1%) or fat-free (skim) milk. Fat-free, low-fat, or reduced-fat cheeses. Nonfat, low-sodium ricotta or cottage cheese. Low-fat or nonfat yogurt. Low-fat, low-sodium cheese. Fats and oils Soft margarine without trans fats. Vegetable oil. Low-fat, reduced-fat, or light mayonnaise and salad dressings (reduced-sodium). Canola, safflower, olive, soybean, and sunflower oils. Avocado. Seasoning and other foods Herbs. Spices. Seasoning mixes without salt. Unsalted popcorn and pretzels. Fat-free sweets. What foods are not recommended? The items listed may not be a complete list. Talk with your dietitian about what dietary choices are best for you. Grains Baked goods made with fat, such as croissants, muffins, or some breads. Dry pasta or rice meal packs. Vegetables Creamed or fried vegetables. Vegetables in a cheese sauce. Regular canned vegetables (not low-sodium or reduced-sodium). Regular canned tomato sauce and paste (not low-sodium or reduced-sodium). Regular tomato and vegetable juice (not low-sodium or reduced-sodium). Angie Fava. Olives. Fruits Canned fruit in a light or heavy syrup. Fried fruit. Fruit in cream or butter sauce. Meat and other protein foods Fatty cuts of meat. Ribs. Fried meat. Berniece Salines. Sausage. Bologna and other processed lunch meats. Salami. Fatback. Hotdogs. Bratwurst. Salted nuts and seeds. Canned beans with added salt. Canned or smoked fish. Whole eggs or egg yolks. Chicken or [place] with skin. Dairy Whole or 2% milk, cream, and half-and-half. Whole or full-fat cream cheese. Whole-fat or sweetened yogurt. Full-fat  cheese. Nondairy creamers. Whipped toppings. Processed cheese and cheese spreads. Fats and oils Butter. Stick margarine. Lard. Shortening. Ghee. Bacon fat. Tropical oils, such as coconut, palm kernel, or palm oil. Seasoning and other foods Salted popcorn and pretzels. Onion salt, garlic salt, seasoned salt, table salt, and sea salt. Worcestershire  sauce. Tartar sauce. Barbecue sauce. Teriyaki sauce. Soy sauce, including reduced-sodium. Steak sauce. Canned and packaged gravies. Fish sauce. Oyster sauce. Cocktail sauce. Horseradish that you find on the shelf. Ketchup. Mustard. Meat flavorings and tenderizers. Bouillon cubes. Hot sauce and Tabasco sauce. Premade or packaged marinades. Premade or packaged taco seasonings. Relishes. Regular salad dressings. Where to find more information:  National Heart, Lung, and [place]: https://wilson-eaton.com/  American Heart Association: www.heart.org Summary  The DASH eating plan is a healthy eating plan that has been shown to reduce high blood pressure (hypertension). It may also reduce your risk for type 2 diabetes, heart disease, and stroke.  With the DASH eating plan, you should limit salt (sodium) intake to 2,300 mg a day. If you have hypertension, you may need to reduce your sodium intake to 1,500 mg a day.  When on the DASH eating plan, aim to eat more fresh fruits and vegetables, whole grains, lean proteins, low-fat dairy, and heart-healthy fats.  Work with your health care provider or diet and nutrition specialist (dietitian) to adjust your eating plan to your individual calorie needs. This information is not intended to replace advice given to you by your health care provider. Make sure you discuss any questions you have with your health care provider. Document Revised: 03/31/2017 Document Reviewed: 04/11/2016 Elsevier Patient Education  2020 [place], [place], [place]  06/17/2019 10:21 AM    [facility] [place], [place], [place]  [address] Phone: [phone]; Fax: [phone]

## 2019-06-17 ENCOUNTER — Other Ambulatory Visit: Payer: Self-pay

## 2019-06-17 ENCOUNTER — Ambulatory Visit: Payer: Medicare PPO | Admitting: Physician Assistant

## 2019-06-17 ENCOUNTER — Encounter: Payer: Self-pay | Admitting: Physician Assistant

## 2019-06-17 VITALS — BP 128/62 | HR 56 | Ht 69.0 in | Wt 290.0 lb

## 2019-06-17 DIAGNOSIS — Z9989 Dependence on other enabling machines and devices: Secondary | ICD-10-CM

## 2019-06-17 DIAGNOSIS — I4819 Other persistent atrial fibrillation: Secondary | ICD-10-CM

## 2019-06-17 DIAGNOSIS — R600 Localized edema: Secondary | ICD-10-CM | POA: Diagnosis not present

## 2019-06-17 DIAGNOSIS — I10 Essential (primary) hypertension: Secondary | ICD-10-CM

## 2019-06-17 DIAGNOSIS — I493 Ventricular premature depolarization: Secondary | ICD-10-CM

## 2019-06-17 DIAGNOSIS — G4733 Obstructive sleep apnea (adult) (pediatric): Secondary | ICD-10-CM

## 2019-06-17 DIAGNOSIS — R140 Abdominal distension (gaseous): Secondary | ICD-10-CM

## 2019-06-17 DIAGNOSIS — Z79899 Other long term (current) drug therapy: Secondary | ICD-10-CM | POA: Diagnosis not present

## 2019-06-17 LAB — HEPATIC FUNCTION PANEL
ALT: 11 IU/L (ref 0–44)
AST: 17 IU/L (ref 0–40)
Albumin: 3.9 g/dL (ref 3.7–4.7)
Alkaline Phosphatase: 86 IU/L (ref 39–117)
Bilirubin Total: 0.4 mg/dL (ref 0.0–1.2)
Bilirubin, Direct: 0.25 mg/dL (ref 0.00–0.40)
Total Protein: 6.5 g/dL (ref 6.0–8.5)

## 2019-06-17 LAB — BASIC METABOLIC PANEL
BUN/Creatinine Ratio: 12 (ref 10–24)
BUN: 29 mg/dL — ABNORMAL HIGH (ref 8–27)
CO2: 24 mmol/L (ref 20–29)
Calcium: 8.8 mg/dL (ref 8.6–10.2)
Chloride: 99 mmol/L (ref 96–106)
Creatinine, Ser: 2.48 mg/dL — ABNORMAL HIGH (ref 0.76–1.27)
GFR calc Af Amer: 28 mL/min/{1.73_m2} — ABNORMAL LOW (ref >59)
GFR calc non Af Amer: 25 mL/min/{1.73_m2} — ABNORMAL LOW (ref >59)
Glucose: 186 mg/dL — ABNORMAL HIGH (ref 65–99)
Potassium: 4.5 mmol/L (ref 3.5–5.2)
Sodium: 141 mmol/L (ref 134–144)

## 2019-06-17 LAB — PRO B NATRIURETIC PEPTIDE: NT-Pro BNP: 2105 pg/mL — ABNORMAL HIGH (ref 0–376)

## 2019-06-17 MED ORDER — TORSEMIDE 20 MG PO TABS: 60.0000 mg | ORAL_TABLET | Freq: Every day | ORAL | 3 refills | Status: DC

## 2019-06-17 NOTE — Patient Instructions (Addendum)
Medication Instructions:  Your physician has recommended you make the following change in your medication:  TAKE Your Torsemide (Demadex) 60 mg (3 pills) every morning *If you need a refill on your cardiac medications before your next appointment, please call your pharmacy*  Lab Work: TODAY - pro-BNP, Liver panel, BMET If you have labs (blood work) drawn today and your tests are completely normal, you will receive your results only by: [name] MyChart Message (if you have MyChart) OR . A paper copy in the mail If you have any lab test that is abnormal or we need to change your treatment, we will call you to review the results.   Testing/Procedures: Return for your echocardiogram tomorrow (Tuesday Feb. 16) at 9:50 am   Follow-Up: At [facility], you and your health needs are our priority.  As part of our continuing mission to provide you with exceptional heart care, we have created designated Provider Care Teams.  These Care Teams include your primary Cardiologist (physician) and Advanced Practice Providers (APPs -  Physician Assistants and Nurse Practitioners) who all work together to provide you with the care you need, when you need it.  Your next appointment:   Monday March 1 at 3:15  The format for your next appointment:   In Person  Provider:   Robbie Lis, PA  Other Instructions  [place] stands for "Dietary Approaches to Stop Hypertension." The DASH eating plan is a healthy eating plan that has been shown to reduce high blood pressure (hypertension). It may also reduce your risk for type 2 diabetes, heart disease, and stroke. The DASH eating plan may also help with weight loss. What are tips for following this plan?  General guidelines  Avoid eating more than 2,300 mg (milligrams) of salt (sodium) a day. If you have hypertension, you may need to reduce your sodium intake to 1,500 mg a day.  Limit alcohol intake to no more than 1 drink a day for nonpregnant women  and 2 drinks a day for men. One drink equals 12 oz of beer, 5 oz of wine, or 1 oz of hard liquor.  Work with your health care provider to maintain a healthy body weight or to lose weight. Ask what an ideal weight is for you.  Get at least 30 minutes of exercise that causes your heart to beat faster (aerobic exercise) most days of the week. Activities may include walking, swimming, or biking.  Work with your health care provider or diet and nutrition specialist (dietitian) to adjust your eating plan to your individual calorie needs. Reading food labels   Check food labels for the amount of sodium per serving. Choose foods with less than 5 percent of the Daily Value of sodium. Generally, foods with less than 300 mg of sodium per serving fit into this eating plan.  To find whole grains, look for the word "whole" as the first word in the ingredient list. Shopping  Buy products labeled as "low-sodium" or "no salt added."  Buy fresh foods. Avoid canned foods and premade or frozen meals. Cooking  Avoid adding salt when cooking. Use salt-free seasonings or herbs instead of table salt or sea salt. Check with your health care provider or pharmacist before using salt substitutes.  Do not fry foods. Cook foods using healthy methods such as baking, boiling, grilling, and broiling instead.  Cook with heart-healthy oils, such as olive, canola, soybean, or sunflower oil. Meal planning  Eat a balanced diet that includes: ? 5  or more servings of fruits and vegetables each day. At each meal, try to fill half of your plate with fruits and vegetables. ? Up to 6-8 servings of whole grains each day. ? Less than 6 oz of lean meat, poultry, or fish each day. A 3-oz serving of meat is about the same size as a deck of cards. One egg equals 1 oz. ? 2 servings of low-fat dairy each day. ? A serving of nuts, seeds, or beans 5 times each week. ? Heart-healthy fats. Healthy fats called Omega-3 fatty acids are  found in foods such as flaxseeds and coldwater fish, like sardines, salmon, and mackerel.  Limit how much you eat of the following: ? Canned or prepackaged foods. ? Food that is high in trans fat, such as fried foods. ? Food that is high in saturated fat, such as fatty meat. ? Sweets, desserts, sugary drinks, and other foods with added sugar. ? Full-fat dairy products.  Do not salt foods before eating.  Try to eat at least 2 vegetarian meals each week.  Eat more home-cooked food and less restaurant, buffet, and fast food.  When eating at a restaurant, ask that your food be prepared with less salt or no salt, if possible. What foods are recommended? The items listed may not be a complete list. Talk with your dietitian about what dietary choices are best for you. Grains Whole-grain or whole-wheat bread. Whole-grain or whole-wheat pasta. Brown rice. Modena Morrow. Bulgur. Whole-grain and low-sodium cereals. Pita bread. Low-fat, low-sodium crackers. Whole-wheat flour tortillas. Vegetables Fresh or frozen vegetables (raw, steamed, roasted, or grilled). Low-sodium or reduced-sodium tomato and vegetable juice. Low-sodium or reduced-sodium tomato sauce and tomato paste. Low-sodium or reduced-sodium canned vegetables. Fruits All fresh, dried, or frozen fruit. Canned fruit in natural juice (without added sugar). Meat and other protein foods Skinless chicken or [place]. Ground chicken or [place]. Pork with fat trimmed off. Fish and seafood. Egg whites. Dried beans, peas, or lentils. Unsalted nuts, nut butters, and seeds. Unsalted canned beans. Lean cuts of beef with fat trimmed off. Low-sodium, lean deli meat. Dairy Low-fat (1%) or fat-free (skim) milk. Fat-free, low-fat, or reduced-fat cheeses. Nonfat, low-sodium ricotta or cottage cheese. Low-fat or nonfat yogurt. Low-fat, low-sodium cheese. Fats and oils Soft margarine without trans fats. Vegetable oil. Low-fat, reduced-fat, or light mayonnaise  and salad dressings (reduced-sodium). Canola, safflower, olive, soybean, and sunflower oils. Avocado. Seasoning and other foods Herbs. Spices. Seasoning mixes without salt. Unsalted popcorn and pretzels. Fat-free sweets. What foods are not recommended? The items listed may not be a complete list. Talk with your dietitian about what dietary choices are best for you. Grains Baked goods made with fat, such as croissants, muffins, or some breads. Dry pasta or rice meal packs. Vegetables Creamed or fried vegetables. Vegetables in a cheese sauce. Regular canned vegetables (not low-sodium or reduced-sodium). Regular canned tomato sauce and paste (not low-sodium or reduced-sodium). Regular tomato and vegetable juice (not low-sodium or reduced-sodium). Angie Fava. Olives. Fruits Canned fruit in a light or heavy syrup. Fried fruit. Fruit in cream or butter sauce. Meat and other protein foods Fatty cuts of meat. Ribs. Fried meat. Berniece Salines. Sausage. Bologna and other processed lunch meats. Salami. Fatback. Hotdogs. Bratwurst. Salted nuts and seeds. Canned beans with added salt. Canned or smoked fish. Whole eggs or egg yolks. Chicken or [place] with skin. Dairy Whole or 2% milk, cream, and half-and-half. Whole or full-fat cream cheese. Whole-fat or sweetened yogurt. Full-fat cheese. Nondairy creamers. Whipped toppings. Processed cheese  and cheese spreads. Fats and oils Butter. Stick margarine. Lard. Shortening. Ghee. Bacon fat. Tropical oils, such as coconut, palm kernel, or palm oil. Seasoning and other foods Salted popcorn and pretzels. Onion salt, garlic salt, seasoned salt, table salt, and sea salt. Worcestershire sauce. Tartar sauce. Barbecue sauce. Teriyaki sauce. Soy sauce, including reduced-sodium. Steak sauce. Canned and packaged gravies. Fish sauce. Oyster sauce. Cocktail sauce. Horseradish that you find on the shelf. Ketchup. Mustard. Meat flavorings and tenderizers. Bouillon cubes. Hot sauce and Tabasco  sauce. Premade or packaged marinades. Premade or packaged taco seasonings. Relishes. Regular salad dressings. Where to find more information:  National Heart, Lung, and [place]: https://wilson-eaton.com/  American Heart Association: www.heart.org Summary  The DASH eating plan is a healthy eating plan that has been shown to reduce high blood pressure (hypertension). It may also reduce your risk for type 2 diabetes, heart disease, and stroke.  With the DASH eating plan, you should limit salt (sodium) intake to 2,300 mg a day. If you have hypertension, you may need to reduce your sodium intake to 1,500 mg a day.  When on the DASH eating plan, aim to eat more fresh fruits and vegetables, whole grains, lean proteins, low-fat dairy, and heart-healthy fats.  Work with your health care provider or diet and nutrition specialist (dietitian) to adjust your eating plan to your individual calorie needs. This information is not intended to replace advice given to you by your health care provider. Make sure you discuss any questions you have with your health care provider. Document Revised: 03/31/2017 Document Reviewed: 04/11/2016 Elsevier Patient Education  2020 [employer].

## 2019-06-18 ENCOUNTER — Ambulatory Visit (HOSPITAL_COMMUNITY)
Admission: RE | Admit: 2019-06-18 | Discharge: 2019-06-18 | Disposition: A | Discharge status: Home / Self Care | Payer: Medicare PPO | Source: Ambulatory Visit | Attending: Physician Assistant | Admitting: Physician Assistant

## 2019-06-18 ENCOUNTER — Telehealth: Payer: Self-pay

## 2019-06-18 DIAGNOSIS — I4819 Other persistent atrial fibrillation: Secondary | ICD-10-CM

## 2019-06-18 DIAGNOSIS — I480 Paroxysmal atrial fibrillation: Secondary | ICD-10-CM | POA: Diagnosis not present

## 2019-06-18 DIAGNOSIS — E119 Type 2 diabetes mellitus without complications: Secondary | ICD-10-CM | POA: Diagnosis not present

## 2019-06-18 DIAGNOSIS — I272 Pulmonary hypertension, unspecified: Secondary | ICD-10-CM | POA: Diagnosis not present

## 2019-06-18 DIAGNOSIS — E785 Hyperlipidemia, unspecified: Secondary | ICD-10-CM | POA: Insufficient documentation

## 2019-06-18 DIAGNOSIS — R140 Abdominal distension (gaseous): Secondary | ICD-10-CM

## 2019-06-18 DIAGNOSIS — R600 Localized edema: Secondary | ICD-10-CM

## 2019-06-18 MED ORDER — POTASSIUM CHLORIDE CRYS ER 20 MEQ PO TBCR: 20.0000 meq | EXTENDED_RELEASE_TABLET | Freq: Every day | ORAL | 3 refills | Status: DC

## 2019-06-18 NOTE — Progress Notes (Signed)
  Echocardiogram 2D Echocardiogram has been performed.  Luella Gardenhire G Joye Wesenberg 06/18/2019, 11:22 AM

## 2019-06-18 NOTE — Telephone Encounter (Signed)
-----   Message from [place], [place] sent at 06/18/2019 10:06 AM EST ----- Fluid marker elevated. Renal function worsening. LFTs normal.  Torsemide as changed yesterday. Stop Losartan. Start Kdur 35meq daily. Keep log of BP. He needs to be seen in clinic early next week.   [place] labs to PCP. What is status of nephrology referral (PCP has done about week ago)?

## 2019-06-18 NOTE — Telephone Encounter (Signed)
Per [place] PA, patient should be seen by Kathyrn Drown NP next week. Will put in nephrology referral also.

## 2019-06-18 NOTE — Telephone Encounter (Signed)
Called patient with results. Per Bhagat PA, Fluid marker elevated. Renal function worsening. LFTs normal. Torsemide as changed yesterday. Stop Losartan. Start Kdur 56meq daily. Keep log of BP. He needs to be seen in clinic early next week. Patient has an appointment on 07/01/19 with Bhagat PA, will see if this appointment is fine or if patient needs to be seen next week. If so, scheduled patient for next Tuesday to see Kathyrn Drown NP. Patient stated that he still has not gotten a call from nephrology and his stated PCP put referral in 10 days ago. Will forward to [facility] for advisement.

## 2019-06-21 ENCOUNTER — Telehealth (HOSPITAL_COMMUNITY): Payer: Self-pay | Admitting: Physician Assistant

## 2019-06-21 ENCOUNTER — Other Ambulatory Visit (HOSPITAL_COMMUNITY): Payer: Self-pay | Admitting: *Deleted

## 2019-06-21 MED ORDER — AMIODARONE HCL 200 MG PO TABS: ORAL_TABLET | ORAL | 0 refills | Status: DC

## 2019-06-21 NOTE — Telephone Encounter (Signed)
Patient called regarding echocardiogram results. Given LVH on echo, discussed with patient switching from flecainide to amiodarone. After discussing the risks and benefits of amiodarone and side effects, patient in agreement with plan. Patient to stop flecainide today and start amiodarone 200 mg BID on the night of 06/24/19. Continue BID for 4 weeks then decrease to 200 mg daily. Could also consider stopping metoprolol if he remains bradycardic as he no longer needs this to oppose flecainide.

## 2019-06-22 NOTE — Progress Notes (Signed)
Cardiology Office Note   Date:  06/25/2019   ID:  [name], DOB 09-02-1945, MRN [id number]  PCP:  Kathyrn Lass, MD  Cardiologist: Dr. Margaree Mackintosh  Chief Complaint  Patient presents with  . Follow-up  . Shortness of Breath  . Edema    History of Present Illness: Mark Singh is a 74 y.o. male who presents for 1 week follow-up, seen for Dr. Marlou Porch.  Mr. Singh has a history of persistent atrial fibrillation/atrial flutter post ablation in 2019, hypertension, hyperlipidemia, OSA on CPAP, hypothyroidism, DM2 and morbid obesity who was last seen in the office on 06/17/2019 for bilateral lower extremity edema.  He has a history of longstanding atrial fibrillation with multiple cardioversions, last was in May 2020.  He has been maintaining sinus rhythm on flecainide and metoprolol.  Is anticoagulated with Xarelto.  Monitors his rhythm on his apple watch.  More recently, he began noticing lower extremity edema with abdominal distention at which time he had gained approximately 20 pounds with no improvement on Lasix which is ultimately changed to torsemide.  His losartan was reduced secondary to worsening renal function and he is pending a nephrology evaluation.  On his last office visit appointment, his weight was noted to be 290 with a weight of 273 in July 2020.  His torsemide was increased to 60 mg daily.  Today Mr. Pitcock states that his symptoms have improved since last office visit.  He denies shortness of breath.  Weight today is back to his baseline of 274lb, down from 290lb on 06/17/2019.  He reports that he has noticed in the last 3 to 4 days that his urine output has picked up quite a bit.  States that he probably lost approximately 5 pounds on Sunday alone.  We will recheck labs today.  Heart rate appears to be slower than usual at 47 bpm.  States that his heart rate will at times drop into the low 40s.  He does report fatigue.  Given the  discontinuation of flecainide we will discontinue his Toprol 12.5 mg daily.  Patient agrees.  Hopefully this will give him a little energy back.  He denies chest pain, palpitations, orthopnea, PND, dizziness or syncope.  Continues to have pretty significant lower extremity edema however given his poor renal function (waiting for nephrology consultation), poses limitations to how aggressive we can be.  We discussed the use of compression stockings which he obtained yesterday.  Also discussed low-sodium diet in depth.   Past Medical History:  Diagnosis Date  . Arthritis    "hands, feet" (12/13/2017)  . Asthma   . Bradycardia    a. Repoted h/o HR in the 40s.  . Edema    a. 2D echo 11/2013: EF 26-94%, LV diastolic function parameters were normal, severely dilated LA, PASP 21mmHg..  . H/O: gout    right foot; on daily RX" (12/13/2017)  . HTN (hypertension)   . Hyperlipidemia   . Myalgia and myositis, unspecified   . Nephrotic syndrome    a. responder to steroid therapy by Dr. Lorrene Reid.   . Obesity   . Orthostatic hypotension   . Penile cancer ([place])    "did circ"  . Pulmonary hypertension ([place])    a. Mild pulm HTN felt 2/2 obesity.  [name] PVC's (premature ventricular contractions)    a. Holter 05/2013 showing 18k PVCs (18% of the time).  . Seasonal asthma    mild  . Shoulder pain   . Squamous  carcinoma    "right hand; left shoulder" (12/13/2017)  . Type 2 diabetes mellitus ([place])     Past Surgical History:  Procedure Laterality Date  . A-FLUTTER ABLATION N/A 02/23/2018   Procedure: A-FLUTTER ABLATION;  Surgeon: Constance Haw, MD;  Location: [place] [facility];  Service: Cardiovascular;  Laterality: N/A;  . ATRIAL FIBRILLATION ABLATION N/A 12/13/2017   Procedure: ATRIAL FIBRILLATION ABLATION;  Surgeon: Constance Haw, MD;  Location: [place] [facility];  Service: Cardiovascular;  Laterality: N/A;  . BUNIONECTOMY WITH HAMMERTOE RECONSTRUCTION Left 1992  &  2008   REMOVAL HEAL SPUR/  BUNIONECTOMY AND HAMMERTOE REPAIR   . CARDIOVERSION N/A 07/22/2015   Procedure: CARDIOVERSION;  Surgeon: Dorothy Spark, MD;  Location: [place];  Service: Cardiovascular;  Laterality: N/A;  . CARDIOVERSION N/A 10/06/2015   Procedure: CARDIOVERSION;  Surgeon: Sueanne Margarita, MD;  Location: [place];  Service: Cardiovascular;  Laterality: N/A;  . CARDIOVERSION N/A 12/05/2016   Procedure: CARDIOVERSION;  Surgeon: Pixie Casino, MD;  Location: [facility] END[facility];  Service: Cardiovascular;  Laterality: N/A;  . CARDIOVERSION N/A 06/29/2018   Procedure: CARDIOVERSION;  Surgeon: Thayer Headings, MD;  Location: [facility] ENDOSCOPY;  Service: Cardiovascular;  Laterality: N/A;  . CARDIOVERSION N/A 09/20/2018   Procedure: CARDIOVERSION;  Surgeon: [name], MD;  Location: [place];  Service: Cardiovascular;  Laterality: N/A;  . CIRCUMCISION N/A 11/27/2012   Procedure:  CIRCUMCISION  AND EXCISION OF GLANS PENIS;  Surgeon: Fredricka Bonine, MD;  Location[facility];  Service: Urology;  Laterality: N/A;  . ELECTROPHYSIOLOGIC STUDY N/A 03/30/2015   Procedure: PVC Ablation;  Surgeon: [name], MD;  Location: [place] [facility];  Service: Cardiovascular;  Laterality: N/A;  . HERNIA REPAIR    . SQUAMOUS CELL CARCINOMA EXCISION     "right hand; left shoulder" (12/13/2017)  . UMBILICAL HERNIA REPAIR  11-04-2005     Current Outpatient Medications  Medication Sig Dispense Refill  . amiodarone (PACERONE) 200 MG tablet Take 1 tablet by mouth twice a day for 1 month then reduce to 1 tablet daily 60 tablet 0  . atorvastatin (LIPITOR) 40 MG tablet Take 40 mg by mouth daily.    . Cholecalciferol (VITAMIN D3) 5000 units CAPS Take 5,000 Units by mouth daily.     . cloNIDine (CATAPRES) 0.1 MG tablet Take 1 tablet by mouth 2 (two) times daily.     . Cyanocobalamin (VITAMIN B12) 3000 MCG SUBL Take 3,000 mcg by mouth daily.    [name] LANTUS SOLOSTAR 100 UNIT/ML Solostar Pen Inject 40  Units into the skin at bedtime.   0  . omeprazole (PRILOSEC) 20 MG capsule Take 20 mg by mouth daily.    Glory Rosebush Delica Lancets 57D MISC USE AS DIRECTED EVERY DAY    . ONETOUCH VERIO test strip USE AS DIRECTED (TEST DAILY) 90 **E11.9    . pimecrolimus (ELIDEL) 1 % cream Apply 1 application topically 2 (two) times daily as needed (psoriasis).     . potassium chloride SA (KLOR-CON) 20 MEQ tablet Take 1 tablet (20 mEq total) by mouth daily. 90 tablet 3  . probenecid (BENEMID) 500 MG tablet Take 500 mg by mouth daily.     . tamsulosin (FLOMAX) 0.4 MG CAPS capsule Take 0.4 mg by mouth every evening.   4  . torsemide (DEMADEX) 20 MG tablet Take 3 tablets (60 mg total) by mouth daily. 270 tablet 3  . WIXELA INHUB 100-50 MCG/DOSE AEPB Inhale 2 puffs  into the lungs daily as needed.    Alveda Reasons 20 MG TABS tablet TAKE 1 TABLET (20 MG TOTAL) BY MOUTH DAILY WITH SUPPER. 90 tablet 1   No current facility-administered medications for this visit.    Allergies:   Indomethacin    Social History:  The patient  reports that he has never smoked. He has never used smokeless tobacco. He reports that he does not drink alcohol or use drugs.   Family History:  The patient's family history includes CVA in his father; Coronary artery disease in his mother.    ROS:  Please see the history of present illness. Otherwise, review of systems are positive for none.   All other systems are reviewed and negative.    PHYSICAL EXAM: VS:  BP 130/60   Pulse (!) 47   Ht 5\' 9"  (1.753 m)   Wt 274 lb (124.3 kg)   SpO2 94%   BMI 40.46 kg/m  , BMI Body mass index is 40.46 kg/m.   General: Overweight,  NAD Neck: Negative for carotid bruits. No JVD Lungs:Clear to ausculation bilaterally. No wheezes, rales, or rhonchi. Breathing is unlabored. Cardiovascular: RRR with S1 S2. No murmurs Abdomen: Soft, non-tender, non-distended Extremities: BLE to knees 2+ edema. Neuro: Alert and oriented. No focal deficits. No facial  asymmetry. MAE spontaneously. Psych: Responds to questions appropriately with normal affect.     EKG:  EKG is not ordered today.   Recent Labs: 10/01/2018: Hemoglobin 11.0; Platelets 227 06/17/2019: ALT 11; BUN 29; Creatinine, Ser 2.48; NT-Pro BNP 2,105; Potassium 4.5; Sodium 141    Lipid Panel No results found for: CHOL, TRIG, HDL, CHOLHDL, VLDL, LDLCALC, LDLDIRECT    Wt Readings from Last 3 Encounters:  06/25/19 274 lb (124.3 kg)  06/17/19 290 lb (131.5 kg)  06/11/19 291 lb 3.2 oz (132.1 kg)    Other studies Reviewed: Additional studies/ records that were reviewed today include:  Echocardiogram 06/18/19:  1. Left ventricular ejection fraction, by estimation, is 60 to 65%. The  left ventricle has normal function. The left ventricle has no regional  wall motion abnormalities. The left ventricular internal cavity size was  mildly dilated. There is moderate  concentric left ventricular hypertrophy. Left ventricular diastolic  parameters are indeterminate.  2. Right ventricular systolic function is normal. The right ventricular  size is severely enlarged. There is mildly elevated pulmonary artery  systolic pressure. The estimated right ventricular systolic pressure is  92.4 mmHg.  3. Left atrial size was moderately dilated.  4. Right atrial size was moderately dilated.  5. The mitral valve is normal in structure and function. Trivial mitral  valve regurgitation. No evidence of mitral stenosis.  6. Tricuspid valve regurgitation is mild to moderate.  7. The aortic valve is tricuspid. Aortic valve regurgitation is not  visualized. Mild aortic valve sclerosis is present, with no evidence of  aortic valve stenosis.  8. The inferior vena cava is dilated in size with <50% respiratory  variability, suggesting right atrial pressure of 15 mmHg.  ASSESSMENT AND PLAN:  1.  Lower extremity edema with abdominal distention: -Patient recently seen 06/17/2019 with elevated weight and  lower extremity edema at which time his torsemide was increased to 60 mg p.o. daily.  He has a pending nephrology evaluation referred by his PCP.  An echocardiogram was performed which showed an LVEF of 60 to 65% with moderate LVH, severe right ventricular enlargement, biatrial enlargement. -Given above echocardiogram results, flecainide was transitioned to amiodarone 200 mg p.o. twice  daily for 1 month then 200 mg p.o. daily -We will continue with torsemide 60 mg p.o. daily and repeat lab work today -Has baseline CKD with nephrology consultation as above therefore limits aggressiveness with diuretic therapy -Continues to have 2+ BLE to knees however reports he is no longer short of breath and has had good urine output -Weight has decreased from 290lb on 06/17/2019 to 274lb today   2.  Persistent atrial fibrillation/typical atrial flutter status post ablation: -On amiodarone 200mg  PO BID for one month (07/22/19) then reduce to 200mg  PO QD and metoprolol>>may need to discontinue BB given discontinuation of Flecainide -Flecainide discontinued per AF clinic PA  -Will need baseline TSH, CXR and regular eye exams if long-term amiodarone planned -CHA2DS2-VASc score 3, anticoagulated with Xarelto  3.  OSA on CPAP: -Compliant with CPAP   4.  HTN: -Stable, 130/60 -Continue current regimen with clonidine -Toprol discontinued secondary to bradycardia with a heart rate of 47 bpm   5.  Bradycardia -Flecainide discontinued secondary to echocardiogram results with LVH therefore placed on amiodarone -Given bradycardia and no reports of fatigue we will discontinue low-dose Toprol and follo   Current medicines are reviewed at length with the patient today.  The patient does not have concerns regarding medicines.  The following changes have been made: Discontinue Toprol 12.5 mg p.o. daily  Labs/ tests ordered today include: BMET  Orders Placed This Encounter  Procedures  . Basic metabolic panel     Disposition:   FU with atrial fibrillation clinic in 3 weeks  Signed, Kathyrn Drown, NP  06/25/2019 [facility] [place], [place], [place]  [address] Phone: [phone]; Fax: [phone]

## 2019-06-25 ENCOUNTER — Ambulatory Visit: Payer: Medicare PPO | Admitting: Cardiology

## 2019-06-25 ENCOUNTER — Encounter: Payer: Self-pay | Admitting: Cardiology

## 2019-06-25 ENCOUNTER — Other Ambulatory Visit: Payer: Self-pay

## 2019-06-25 ENCOUNTER — Encounter: Payer: Self-pay | Admitting: *Deleted

## 2019-06-25 VITALS — BP 130/60 | HR 47 | Ht 69.0 in | Wt 274.0 lb

## 2019-06-25 DIAGNOSIS — R600 Localized edema: Secondary | ICD-10-CM

## 2019-06-25 DIAGNOSIS — I483 Typical atrial flutter: Secondary | ICD-10-CM | POA: Diagnosis not present

## 2019-06-25 DIAGNOSIS — R140 Abdominal distension (gaseous): Secondary | ICD-10-CM

## 2019-06-25 DIAGNOSIS — E7800 Pure hypercholesterolemia, unspecified: Secondary | ICD-10-CM

## 2019-06-25 DIAGNOSIS — Z79899 Other long term (current) drug therapy: Secondary | ICD-10-CM

## 2019-06-25 DIAGNOSIS — I10 Essential (primary) hypertension: Secondary | ICD-10-CM | POA: Diagnosis not present

## 2019-06-25 DIAGNOSIS — I4819 Other persistent atrial fibrillation: Secondary | ICD-10-CM

## 2019-06-25 LAB — BASIC METABOLIC PANEL
BUN/Creatinine Ratio: 12 (ref 10–24)
BUN: 26 mg/dL (ref 8–27)
CO2: 28 mmol/L (ref 20–29)
Calcium: 9.7 mg/dL (ref 8.6–10.2)
Chloride: 101 mmol/L (ref 96–106)
Creatinine, Ser: 2.22 mg/dL — ABNORMAL HIGH (ref 0.76–1.27)
GFR calc Af Amer: 33 mL/min/{1.73_m2} — ABNORMAL LOW (ref >59)
GFR calc non Af Amer: 28 mL/min/{1.73_m2} — ABNORMAL LOW (ref >59)
Glucose: 151 mg/dL — ABNORMAL HIGH (ref 65–99)
Potassium: 4.6 mmol/L (ref 3.5–5.2)
Sodium: 145 mmol/L — ABNORMAL HIGH (ref 134–144)

## 2019-06-25 NOTE — Patient Instructions (Addendum)
Medication Instructions:   Your physician has recommended you make the following change in your medication:   1) Stop Toprol XL (Metoprolol)  *If you need a refill on your cardiac medications before your next appointment, please call your pharmacy*  Lab Work:  You will have labs drawn today: BMET  If you have labs (blood work) drawn today and your tests are completely normal, you will receive your results only by: [name] MyChart Message (if you have MyChart) OR . A paper copy in the mail If you have any lab test that is abnormal or we need to change your treatment, we will call you to review the results.  Testing/Procedures:  None ordered today  Follow-Up: At [facility], you and your health needs are our priority.  As part of our continuing mission to provide you with exceptional heart care, we have created designated Provider Care Teams.  These Care Teams include your primary Cardiologist (physician) and Advanced Practice Providers (APPs -  Physician Assistants and Nurse Practitioners) who all work together to provide you with the care you need, when you need it.  Your next appointment:    Keep your follow up with the A-Fib clinic

## 2019-06-28 ENCOUNTER — Ambulatory Visit: Payer: Medicare PPO | Attending: Internal Medicine

## 2019-06-28 ENCOUNTER — Telehealth: Payer: Self-pay

## 2019-06-28 DIAGNOSIS — Z23 Encounter for immunization: Secondary | ICD-10-CM

## 2019-06-28 NOTE — Telephone Encounter (Signed)
Pt states he is feeling good. He weighed 290 lb at our office on 2/15 and yesterday at nephrologist he weighed 270 lb. He is dizzy at times but overall feels much better. His most recent BP was 131/68.

## 2019-06-28 NOTE — Telephone Encounter (Signed)
-----   Message from [name], MD sent at 06/17/2019  4:16 PM EST ----- Can we call him to see how he is doing and get any info from nephrology. ----- Message ----- From: Oliver Barre, PA Sent: 06/11/2019  12:39 PM EST To: [name], MD, Juluis Mire, RN  Cr continuing to rise. Decreasing losartan to 50mg , holding metformin, changing diuretic to torsemide, and nephrology referral per PCP.

## 2019-06-28 NOTE — Progress Notes (Signed)
   Covid-19 Vaccination Clinic  Name:  JAKEVIOUS HOLLISTER    MRN: [id number] DOB: 04/07/1946  06/28/2019  Mr. Helle was observed post Covid-19 immunization for 15 minutes without incidence. He was provided with Vaccine Information Sheet and instruction to access the V-Safe system.   Mr. Olden was instructed to call 911 with any severe reactions post vaccine: [name] Difficulty breathing  . Swelling of your face and throat  . A fast heartbeat  . A bad rash all over your body  . Dizziness and weakness    Immunizations Administered    Name Date Dose VIS Date Route   Pfizer COVID-19 Vaccine 06/28/2019 10:15 AM 0.3 mL 04/12/2019 Intramuscular   Manufacturer: [place]   Lot: [id number]   [place]: [id number]

## 2019-07-01 ENCOUNTER — Ambulatory Visit: Payer: Medicare PPO | Admitting: Physician Assistant

## 2019-07-03 ENCOUNTER — Other Ambulatory Visit: Payer: Self-pay | Admitting: Cardiology

## 2019-07-03 DIAGNOSIS — G4733 Obstructive sleep apnea (adult) (pediatric): Secondary | ICD-10-CM | POA: Diagnosis not present

## 2019-07-03 NOTE — Telephone Encounter (Signed)
Prescription refill request for Xarelto received.   Last office visit: 06/25/2019, Mcdaniel Weight: 124.3kg Age: 74 y.o. Scr: 2.22, 06/25/2019 CrCl: 51 ml/min    Prescription refill sent.

## 2019-07-10 ENCOUNTER — Ambulatory Visit (HOSPITAL_COMMUNITY)
Admission: RE | Admit: 2019-07-10 | Discharge: 2019-07-10 | Disposition: A | Discharge status: Home / Self Care | Payer: Medicare PPO | Source: Ambulatory Visit | Attending: Physician Assistant | Admitting: Physician Assistant

## 2019-07-10 ENCOUNTER — Other Ambulatory Visit: Payer: Self-pay

## 2019-07-10 VITALS — BP 142/70 | HR 57

## 2019-07-10 DIAGNOSIS — I4891 Unspecified atrial fibrillation: Secondary | ICD-10-CM | POA: Insufficient documentation

## 2019-07-10 DIAGNOSIS — I4819 Other persistent atrial fibrillation: Secondary | ICD-10-CM

## 2019-07-10 DIAGNOSIS — Z79899 Other long term (current) drug therapy: Secondary | ICD-10-CM | POA: Diagnosis not present

## 2019-07-10 NOTE — Telephone Encounter (Signed)
Followed up with patient. Pt is seeing Dr. Marval Regal at [facility].  Will reach out to their office for requested information by Dr. Ulyses Amor.

## 2019-07-10 NOTE — Progress Notes (Signed)
Patient returns for ECG today after starting amiodarone. ECG shows SB HR 57, LAD, PR 140, QRS 98, QTc 418. He reports that he is feeling much better and has lost >20lbs. Decrease amiodarone to 200 mg daily starting 07/22/19. Follow up with Dr Marlou Porch and Dr Curt Bears as scheduled.

## 2019-07-10 NOTE — Telephone Encounter (Signed)
Records requested from [facility]

## 2019-07-13 DIAGNOSIS — G4733 Obstructive sleep apnea (adult) (pediatric): Secondary | ICD-10-CM | POA: Diagnosis not present

## 2019-07-19 ENCOUNTER — Other Ambulatory Visit (HOSPITAL_COMMUNITY): Payer: Self-pay | Admitting: Physician Assistant

## 2019-07-23 ENCOUNTER — Ambulatory Visit: Payer: Medicare PPO | Attending: Internal Medicine

## 2019-07-23 DIAGNOSIS — Z23 Encounter for immunization: Secondary | ICD-10-CM

## 2019-07-23 NOTE — Progress Notes (Signed)
   Covid-19 Vaccination Clinic  Name:  Mark Singh    MRN: [id number] DOB: 1946/01/27  07/23/2019  Mark Singh was observed post Covid-19 immunization for 15 minutes without incident. He was provided with Vaccine Information Sheet and instruction to access the V-Safe system.   Mark Singh was instructed to call 911 with any severe reactions post vaccine: [name] Difficulty breathing  . Swelling of face and throat  . A fast heartbeat  . A bad rash all over body  . Dizziness and weakness   Immunizations Administered    Name Date Dose VIS Date Route   Pfizer COVID-19 Vaccine 07/23/2019 11:29 AM 0.3 mL 04/12/2019 Intramuscular   Manufacturer: [place]   Lot: [id number]   [place]: [address]-9

## 2019-08-05 DIAGNOSIS — G4733 Obstructive sleep apnea (adult) (pediatric): Secondary | ICD-10-CM | POA: Diagnosis not present

## 2019-08-06 DIAGNOSIS — H2512 Age-related nuclear cataract, left eye: Secondary | ICD-10-CM | POA: Diagnosis not present

## 2019-08-06 DIAGNOSIS — H25812 Combined forms of age-related cataract, left eye: Secondary | ICD-10-CM | POA: Diagnosis not present

## 2019-08-06 DIAGNOSIS — H25012 Cortical age-related cataract, left eye: Secondary | ICD-10-CM | POA: Diagnosis not present

## 2019-08-09 ENCOUNTER — Encounter: Payer: Self-pay | Admitting: Cardiology

## 2019-08-09 ENCOUNTER — Ambulatory Visit: Payer: Medicare PPO | Admitting: Cardiology

## 2019-08-09 ENCOUNTER — Other Ambulatory Visit: Payer: Self-pay

## 2019-08-09 VITALS — BP 136/68 | HR 51 | Ht 69.0 in | Wt 250.6 lb

## 2019-08-09 DIAGNOSIS — I5032 Chronic diastolic (congestive) heart failure: Secondary | ICD-10-CM

## 2019-08-09 DIAGNOSIS — I4819 Other persistent atrial fibrillation: Secondary | ICD-10-CM

## 2019-08-09 NOTE — Progress Notes (Signed)
Cardiology Office Note:    Date:  08/09/2019   ID:  Mark Singh, DOB [birth year], MRN [id number]  PCP:  Kathyrn Lass, MD  Cardiologist:  No primary care provider on file.  Electrophysiologist:  [name], MD   Referring MD: Kathyrn Lass, MD     History of Present Illness:    Mark Singh is a 74 y.o. male here for atrial fibrillation/flutter follow-up.  Post ablation 2019.  Has hypertension hyperlipidemia obstructive sleep apnea on CPAP diabetes morbid obesity.  Had some lower extremity edema in the past.  He recently started amiodarone, post amiodarone EKG showed sinus bradycardia 57 with normal intervals.  Feels better.  He decreased his amiodarone to 200 mg a day starting 07/22/2019.  Appreciate Ricky Fenton in A. fib clinic.  290 to 250-this is both from torsemide as well as more activity.  He is walking up to 15,000 steps a day.  5 miles. Creat 2.22 he has recently seen nephrology who has drawn blood work.  Continues to take Xarelto.    Past Medical History:  Diagnosis Date  . Arthritis    "hands, feet" (12/13/2017)  . Asthma   . Bradycardia    a. Repoted h/o HR in the 40s.  . Edema    a. 2D echo 11/2013: EF 46-96%, LV diastolic function parameters were normal, severely dilated LA, PASP 88mmHg..  . H/O: gout    right foot; on daily RX" (12/13/2017)  . HTN (hypertension)   . Hyperlipidemia   . Myalgia and myositis, unspecified   . Nephrotic syndrome    a. responder to steroid therapy by Dr. Lorrene Reid.   . Obesity   . Orthostatic hypotension   . Penile cancer ([place])    "did circ"  . Pulmonary hypertension ([place])    a. Mild pulm HTN felt 2/2 obesity.  [name] PVC's (premature ventricular contractions)    a. Holter 05/2013 showing 18k PVCs (18% of the time).  . Seasonal asthma    mild  . Shoulder pain   . Squamous carcinoma    "right hand; left shoulder" (12/13/2017)  . Type 2 diabetes mellitus ([place])     Past Surgical History:  Procedure Laterality  Date  . A-FLUTTER ABLATION N/A 02/23/2018   Procedure: A-FLUTTER ABLATION;  Surgeon: Constance Haw, MD;  Location: [place] [facility];  Service: Cardiovascular;  Laterality: N/A;  . ATRIAL FIBRILLATION ABLATION N/A 12/13/2017   Procedure: ATRIAL FIBRILLATION ABLATION;  Surgeon: Constance Haw, MD;  Location: [place] [facility];  Service: Cardiovascular;  Laterality: N/A;  . BUNIONECTOMY WITH HAMMERTOE RECONSTRUCTION Left 1992  &  2008   REMOVAL HEAL SPUR/ BUNIONECTOMY AND HAMMERTOE REPAIR   . CARDIOVERSION N/A 07/22/2015   Procedure: CARDIOVERSION;  Surgeon: Dorothy Spark, MD;  Location: [place];  Service: Cardiovascular;  Laterality: N/A;  . CARDIOVERSION N/A 10/06/2015   Procedure: CARDIOVERSION;  Surgeon: Sueanne Margarita, MD;  Location: [facility] ENDOSCOPY;  Service: Cardiovascular;  Laterality: N/A;  . CARDIOVERSION N/A 12/05/2016   Procedure: CARDIOVERSION;  Surgeon: Pixie Casino, MD;  Location: [facility] ENDOSCOPY;  Service: Cardiovascular;  Laterality: N/A;  . CARDIOVERSION N/A 06/29/2018   Procedure: CARDIOVERSION;  Surgeon: Thayer Headings, MD;  Location: [facility] ENDOSCOPY;  Service: Cardiovascular;  Laterality: N/A;  . CARDIOVERSION N/A 09/20/2018   Procedure: CARDIOVERSION;  Surgeon: [name], MD;  Location: [place];  Service: Cardiovascular;  Laterality: N/A;  . CIRCUMCISION N/A 11/27/2012   Procedure:  CIRCUMCISION  AND EXCISION OF GLANS  PENIS;  Surgeon: Fredricka Bonine, MD;  Location: [facility];  Service: Urology;  Laterality: N/A;  . ELECTROPHYSIOLOGIC STUDY N/A 03/30/2015   Procedure: PVC Ablation;  Surgeon: [name], MD;  Location: [place] [facility];  Service: Cardiovascular;  Laterality: N/A;  . HERNIA REPAIR    . SQUAMOUS CELL CARCINOMA EXCISION     "right hand; left shoulder" (12/13/2017)  . UMBILICAL HERNIA REPAIR  11-04-2005    Current Medications: Current Meds  Medication Sig  . amiodarone (PACERONE) 200 MG tablet  Take 1 tablet (200 mg total) by mouth daily.  [name] atorvastatin (LIPITOR) 40 MG tablet Take 40 mg by mouth daily.  . Cholecalciferol (VITAMIN D3) 5000 units CAPS Take 5,000 Units by mouth daily.   . cloNIDine (CATAPRES) 0.1 MG tablet Take 1 tablet by mouth 2 (two) times daily.   . Cyanocobalamin (VITAMIN B12) 3000 MCG SUBL Take 3,000 mcg by mouth daily.  [name] LANTUS SOLOSTAR 100 UNIT/ML Solostar Pen Inject 40 Units into the skin at bedtime.   [name] omeprazole (PRILOSEC) 20 MG capsule Take 20 mg by mouth daily.  Glory Rosebush Delica Lancets 54M MISC USE AS DIRECTED EVERY DAY  . ONETOUCH VERIO test strip USE AS DIRECTED (TEST DAILY) 90 **E11.9  . pimecrolimus (ELIDEL) 1 % cream Apply 1 application topically 2 (two) times daily as needed (psoriasis).   . potassium chloride SA (KLOR-CON) 20 MEQ tablet Take 1 tablet (20 mEq total) by mouth daily.  . probenecid (BENEMID) 500 MG tablet Take 500 mg by mouth daily.   . tamsulosin (FLOMAX) 0.4 MG CAPS capsule Take 0.4 mg by mouth every evening.   . torsemide (DEMADEX) 20 MG tablet Take 3 tablets (60 mg total) by mouth daily.  Grant Ruts INHUB 100-50 MCG/DOSE AEPB Inhale 2 puffs into the lungs daily as needed.  Alveda Reasons 20 MG TABS tablet TAKE 1 TABLET (20 MG TOTAL) BY MOUTH DAILY WITH SUPPER.     Allergies:   Indomethacin   Social History   Socioeconomic History  . Marital status: Widowed    Spouse name: Not on file  . Number of children: Not on file  . Years of education: Not on file  . Highest education level: Not on file  Occupational History  . Not on file  Tobacco Use  . Smoking status: Never Smoker  . Smokeless tobacco: Never Used  Substance and Sexual Activity  . Alcohol use: Never  . Drug use: Never  . Sexual activity: Not Currently  Other Topics Concern  . Not on file  Social History Narrative  . Not on file   Social Determinants of Health   Financial Resource Strain:   . Difficulty of Paying Living Expenses:   Food Insecurity:   .  Worried About Charity fundraiser in the Last Year:   . Arboriculturist in the Last Year:   Transportation Needs:   . Film/video editor (Medical):   [name] Lack of Transportation (Non-Medical):   Physical Activity:   . Days of Exercise per Week:   . Minutes of Exercise per Session:   Stress:   . Feeling of Stress :   Social Connections:   . Frequency of Communication with Friends and Family:   . Frequency of Social Gatherings with Friends and Family:   . Attends Religious Services:   . Active Member of Clubs or Organizations:   . Attends Archivist Meetings:   [name] Marital Status:  Family History: The patient's family history includes CVA in his father; Coronary artery disease in his mother.  ROS:   Please see the history of present illness.     All other systems reviewed and are negative.  EKGs/Labs/Other Studies Reviewed:    The following studies were reviewed today:  Echo 06/18/2019-EF 65% moderate LVH PA pressure 62mmHg trivial mitral regurgitation left atrium moderately dilated  EKG:  EKG is not ordered today  Recent Labs: 10/01/2018: Hemoglobin 11.0; Platelets 227 06/17/2019: ALT 11; NT-Pro BNP 2,105 06/25/2019: BUN 26; Creatinine, Ser 2.22; Potassium 4.6; Sodium 145  Recent Lipid Panel No results found for: CHOL, TRIG, HDL, CHOLHDL, VLDL, LDLCALC, LDLDIRECT  Physical Exam:    VS:  BP 136/68   Pulse (!) 51   Ht 5\' 9"  (1.753 m)   Wt 250 lb 9.6 oz (113.7 kg)   SpO2 96%   BMI 37.01 kg/m     Wt Readings from Last 3 Encounters:  08/09/19 250 lb 9.6 oz (113.7 kg)  06/25/19 274 lb (124.3 kg)  06/17/19 290 lb (131.5 kg)     GEN:  Well nourished, well developed in no acute distress, overweight HEENT: Normal NECK: No JVD; No carotid bruits LYMPHATICS: No lymphadenopathy CARDIAC: RRR, no murmurs, rubs, gallops RESPIRATORY:  Clear to auscultation without rales, wheezing or rhonchi  ABDOMEN: Soft, non-tender, non-distended MUSCULOSKELETAL: Decreased  lower extremity edema; No deformity  SKIN: Warm and dry NEUROLOGIC:  Alert and oriented x 3 PSYCHIATRIC:  Normal affect   ASSESSMENT:    1. Persistent atrial fibrillation ([place])   2. Chronic diastolic heart failure ([place])   3. Morbid obesity ([place])    PLAN:    In order of problems listed above:  Persistent atrial fibrillation -Post ablation -Now on amiodarone 200 mg a day.  Stable.  Last EKG reassuring.  To monitor TSH LFTs.  Obstructive sleep apnea on CPAP -Compliant  Chronic diastolic heart failure/lower extremity edema -Normal EF.  Excellent job with weight loss with torsemide increase from [place].  290 down to 250.  Walking about 5 miles 5 times a week.  Chronic kidney disease stage III/IV -He does see nephrology.  Creatinine has been 2.2.  They have been closely monitoring.  Avoid NSAIDs.  I am fine with him taking his current torsemide dose.  Chronic anticoagulation -Continuing with Xarelto 20 mg at this point.  Down the road, if his kidney function continues to falter, we may need to dose adjust this down to 15 mg.  Morbid obesity -Great job with weight loss.  Medication Adjustments/Labs and Tests Ordered: Current medicines are reviewed at length with the patient today.  Concerns regarding medicines are outlined above.  No orders of the defined types were placed in this encounter.  No orders of the defined types were placed in this encounter.   Patient Instructions  Medication Instructions:  The current medical regimen is effective;  continue present plan and medications.  *If you need a refill on your cardiac medications before your next appointment, please call your pharmacy*  Follow-Up: At [facility], you and your health needs are our priority.  As part of our continuing mission to provide you with exceptional heart care, we have created designated Provider Care Teams.  These Care Teams include your primary Cardiologist (physician) and Advanced Practice  Providers (APPs -  Physician Assistants and Nurse Practitioners) who all work together to provide you with the care you need, when you need it.  We recommend signing up for the patient  portal called "MyChart".  Sign up information is provided on this After Visit Summary.  MyChart is used to connect with patients for Virtual Visits (Telemedicine).  Patients are able to view lab/test results, encounter notes, upcoming appointments, etc.  Non-urgent messages can be sent to your provider as well.   To learn more about what you can do with MyChart, go to [email or web address].    Your next appointment:   6 month(s)  The format for your next appointment:   In Person  Provider:   You may see Kathyrn Drown, NP. or one of the following Advanced Practice Providers on your designated Care Team:    Truitt Merle, NP  Cecilie Kicks, NP  Kathyrn Drown, NP  And Dr Marlou Porch in 1 year.  Thank you for choosing [facility]!!        Signed, [name], MD  08/09/2019 11:02 AM    [place]

## 2019-08-09 NOTE — Patient Instructions (Signed)
Medication Instructions:  The current medical regimen is effective;  continue present plan and medications.  *If you need a refill on your cardiac medications before your next appointment, please call your pharmacy*  Follow-Up: At [facility], you and your health needs are our priority.  As part of our continuing mission to provide you with exceptional heart care, we have created designated Provider Care Teams.  These Care Teams include your primary Cardiologist (physician) and Advanced Practice Providers (APPs -  Physician Assistants and Nurse Practitioners) who all work together to provide you with the care you need, when you need it.  We recommend signing up for the patient portal called "MyChart".  Sign up information is provided on this After Visit Summary.  MyChart is used to connect with patients for Virtual Visits (Telemedicine).  Patients are able to view lab/test results, encounter notes, upcoming appointments, etc.  Non-urgent messages can be sent to your provider as well.   To learn more about what you can do with MyChart, go to [email or web address].    Your next appointment:   6 month(s)  The format for your next appointment:   In Person  Provider:   You may see Kathyrn Drown, NP. or one of the following Advanced Practice Providers on your designated Care Team:    Truitt Merle, NP  Cecilie Kicks, NP  Kathyrn Drown, NP  And Dr Marlou Porch in 1 year.  Thank you for choosing [facility]!!

## 2019-08-13 DIAGNOSIS — G4733 Obstructive sleep apnea (adult) (pediatric): Secondary | ICD-10-CM | POA: Diagnosis not present

## 2019-08-14 DIAGNOSIS — G4733 Obstructive sleep apnea (adult) (pediatric): Secondary | ICD-10-CM | POA: Diagnosis not present

## 2019-08-15 ENCOUNTER — Ambulatory Visit: Payer: Medicare PPO | Admitting: Cardiology

## 2019-08-21 DIAGNOSIS — E119 Type 2 diabetes mellitus without complications: Secondary | ICD-10-CM | POA: Diagnosis not present

## 2019-09-03 ENCOUNTER — Encounter: Payer: Self-pay | Admitting: Cardiology

## 2019-09-03 ENCOUNTER — Ambulatory Visit: Payer: Medicare PPO | Admitting: Cardiology

## 2019-09-03 ENCOUNTER — Other Ambulatory Visit: Payer: Self-pay

## 2019-09-03 VITALS — BP 142/68 | HR 47 | Ht 69.0 in | Wt 247.0 lb

## 2019-09-03 DIAGNOSIS — I4819 Other persistent atrial fibrillation: Secondary | ICD-10-CM

## 2019-09-03 MED ORDER — CLONIDINE HCL 0.2 MG PO TABS: 0.2000 mg | ORAL_TABLET | Freq: Two times a day (BID) | ORAL | 6 refills | Status: DC

## 2019-09-03 NOTE — Progress Notes (Signed)
Electrophysiology Office Note   Date:  09/03/2019   ID:  Mark Singh, DOB 1946/02/14, MRN [id number]  PCP:  Kathyrn Lass, MD  Cardiologist:  Marlou Porch Primary Electrophysiologist:  Karinne Schmader Meredith Leeds, MD    No chief complaint on file.    History of Present Illness: Mark Singh is a 74 y.o. male who presents today for electrophysiology evaluation.   history of PVC's, bradycardia, nephrotic syndrome (complete responder to steroid therapy by Dr. Lorrene Reid), DM, HTN, HLD, obesity, orthostatic hypotension, and mild pulm HTN felt 2/2 obesity Comes in today now s/p ablation procedure for his PVC's.  PVCs were mapped to inferior to the [facility] and successfully ablated on 03/30/15.  He subsequently went into atrial fibrillation and had ablation 12/13/2017.  He subsequently presented back to atrial fibrillation clinic in atrial flutter.  He is status post atrial flutter ablation on 02/26/2018.  Today, denies symptoms of palpitations, chest pain, shortness of breath, orthopnea, PND, lower extremity edema, claudication, dizziness, presyncope, syncope, bleeding, or neurologic sequela. The patient is tolerating medications without difficulties.  He is doing much better over the last 6 weeks.  He is back to walking.  His weight is down to 247 pounds.  He has lost 40 pounds with aggressive diuresis.  Past Medical History:  Diagnosis Date  . Arthritis    "hands, feet" (12/13/2017)  . Asthma   . Bradycardia    a. Repoted h/o HR in the 40s.  . Edema    a. 2D echo 11/2013: EF 57-84%, LV diastolic function parameters were normal, severely dilated LA, PASP 28mmHg..  . H/O: gout    right foot; on daily RX" (12/13/2017)  . HTN (hypertension)   . Hyperlipidemia   . Myalgia and myositis, unspecified   . Nephrotic syndrome    a. responder to steroid therapy by Dr. Lorrene Reid.   . Obesity   . Orthostatic hypotension   . Penile cancer (Mark Singh)    "did circ"  . Pulmonary hypertension ([place])    a. Mild  pulm HTN felt 2/2 obesity.  [name] PVC's (premature ventricular contractions)    a. Holter 05/2013 showing 18k PVCs (18% of the time).  . Seasonal asthma    mild  . Shoulder pain   . Squamous carcinoma    "right hand; left shoulder" (12/13/2017)  . Type 2 diabetes mellitus ([place])    Past Surgical History:  Procedure Laterality Date  . A-FLUTTER ABLATION N/A 02/23/2018   Procedure: A-FLUTTER ABLATION;  Surgeon: Constance Haw, MD;  Location: [place] CV LAB;  Service: Cardiovascular;  Laterality: N/A;  . ATRIAL FIBRILLATION ABLATION N/A 12/13/2017   Procedure: ATRIAL FIBRILLATION ABLATION;  Surgeon: Constance Haw, MD;  Location: [place] CV LAB;  Service: Cardiovascular;  Laterality: N/A;  . BUNIONECTOMY WITH HAMMERTOE RECONSTRUCTION Left 1992  &  2008   REMOVAL HEAL SPUR/ BUNIONECTOMY AND HAMMERTOE REPAIR   . CARDIOVERSION N/A 07/22/2015   Procedure: CARDIOVERSION;  Surgeon: Dorothy Spark, MD;  Location: [place];  Service: Cardiovascular;  Laterality: N/A;  . CARDIOVERSION N/A 10/06/2015   Procedure: CARDIOVERSION;  Surgeon: Sueanne Margarita, MD;  Location: [facility] ENDOSCOPY;  Service: Cardiovascular;  Laterality: N/A;  . CARDIOVERSION N/A 12/05/2016   Procedure: CARDIOVERSION;  Surgeon: Pixie Casino, MD;  Location: [facility] ENDOSCOPY;  Service: Cardiovascular;  Laterality: N/A;  . CARDIOVERSION N/A 06/29/2018   Procedure: CARDIOVERSION;  Surgeon: [name] Wonda Cheng, MD;  Location: [place];  Service: Cardiovascular;  Laterality: N/A;  . CARDIOVERSION N/A  09/20/2018   Procedure: CARDIOVERSION;  Surgeon: [name], MD;  Location: [place];  Service: Cardiovascular;  Laterality: N/A;  . CIRCUMCISION N/A 11/27/2012   Procedure:  CIRCUMCISION  AND EXCISION OF GLANS PENIS;  Surgeon: Fredricka Bonine, MD;  Location: [facility];  Service: Urology;  Laterality: N/A;  . ELECTROPHYSIOLOGIC STUDY N/A 03/30/2015   Procedure: PVC Ablation;  Surgeon: Kimala Horne  Meredith Leeds, MD;  Location: [place] CV LAB;  Service: Cardiovascular;  Laterality: N/A;  . HERNIA REPAIR    . SQUAMOUS CELL CARCINOMA EXCISION     "right hand; left shoulder" (12/13/2017)  . UMBILICAL HERNIA REPAIR  11-04-2005     Current Outpatient Medications  Medication Sig Dispense Refill  . amiodarone (PACERONE) 200 MG tablet Take 1 tablet (200 mg total) by mouth daily. 30 tablet 3  . atorvastatin (LIPITOR) 40 MG tablet Take 40 mg by mouth daily.    . Cholecalciferol (VITAMIN D3) 5000 units CAPS Take 5,000 Units by mouth daily.     . cloNIDine (CATAPRES) 0.1 MG tablet Take 1 tablet by mouth 2 (two) times daily.     . Cyanocobalamin (VITAMIN B12) 3000 MCG SUBL Take 3,000 mcg by mouth daily.    [name] LANTUS SOLOSTAR 100 UNIT/ML Solostar Pen Inject 40 Units into the skin at bedtime.   0  . omeprazole (PRILOSEC) 20 MG capsule Take 20 mg by mouth daily.    Glory Rosebush Delica Lancets 96V MISC USE AS DIRECTED EVERY DAY    . ONETOUCH VERIO test strip USE AS DIRECTED (TEST DAILY) 90 **E11.9    . pimecrolimus (ELIDEL) 1 % cream Apply 1 application topically 2 (two) times daily as needed (psoriasis).     . potassium chloride SA (KLOR-CON) 20 MEQ tablet Take 1 tablet (20 mEq total) by mouth daily. 90 tablet 3  . probenecid (BENEMID) 500 MG tablet Take 500 mg by mouth daily.     . tamsulosin (FLOMAX) 0.4 MG CAPS capsule Take 0.4 mg by mouth every evening.   4  . torsemide (DEMADEX) 20 MG tablet Take 3 tablets (60 mg total) by mouth daily. 270 tablet 3  . WIXELA INHUB 100-50 MCG/DOSE AEPB Inhale 2 puffs into the lungs daily as needed.    Alveda Reasons 20 MG TABS tablet TAKE 1 TABLET (20 MG TOTAL) BY MOUTH DAILY WITH SUPPER. 90 tablet 1   No current facility-administered medications for this visit.    Allergies:   Indomethacin   Social History:  The patient  reports that he has never smoked. He has never used smokeless tobacco. He reports that he does not drink alcohol or use drugs.   Family  History:  The patient's family history includes CVA in his father; Coronary artery disease in his mother.   ROS:  Please see the history of present illness.   Otherwise, review of systems is positive for none.   All other systems are reviewed and negative.   PHYSICAL EXAM: VS:  BP (!) 142/68   Pulse (!) 47   Ht 5\' 9"  (1.753 m)   Wt 247 lb (112 kg)   BMI 36.48 kg/m  , BMI Body mass index is 36.48 kg/m. GEN: Well nourished, well developed, in no acute distress  HEENT: normal  Neck: no JVD, carotid bruits, or masses Cardiac: RRR; no murmurs, rubs, or gallops,no edema  Respiratory:  clear to auscultation bilaterally, normal work of breathing GI: soft, nontender, nondistended, + BS MS: no deformity or atrophy  Skin: warm  and dry Neuro:  Strength and sensation are intact Psych: euthymic mood, full affect  EKG:  EKG is ordered today. Personal review of the ekg ordered shows sinus rhythm, rate 47  Recent Labs: 10/01/2018: Hemoglobin 11.0; Platelets 227 06/17/2019: ALT 11; NT-Pro BNP 2,105 06/25/2019: BUN 26; Creatinine, Ser 2.22; Potassium 4.6; Sodium 145    Lipid Panel  No results found for: CHOL, TRIG, HDL, CHOLHDL, VLDL, LDLCALC, LDLDIRECT   Wt Readings from Last 3 Encounters:  09/03/19 247 lb (112 kg)  08/09/19 250 lb 9.6 oz (113.7 kg)  06/25/19 274 lb (124.3 kg)      Other studies Reviewed: Additional studies/ records that were reviewed today include: TTE 12/28/14  Review of the above records today demonstrates:  - Left ventricle: The cavity size was normal. There was mild concentric hypertrophy. Systolic function was normal. The estimated ejection fraction was in the range of 55% to 60%. Although no diagnostic regional wall motion abnormality was identified, this possibility cannot be completely excluded on the basis of this study. Left ventricular diastolic function parameters were normal. - Left atrium: The atrium was severely dilated. - Pulmonary  arteries: Systolic pressure was mildly increased. PA peak pressure: 38 mm Hg (S).   ASSESSMENT AND PLAN:  1.  PVCs: Status post ablation 03/22/2015.  No obvious recurrence.    2.  Persistent atrial fibrillation: Currently on Xarelto, amiodarone.  Status post ablation 12/13/2017.  CHA2DS2-VASc of 3.  Remains in sinus rhythm.  No changes.   3.  Typical atrial flutter: Status post ablation 02/26/2018 without recurrence.  4.  Chronic diastolic heart failure: Weight is down 40 pounds.  His ejection fraction is fortunately normal.  He is currently feeling the best he has in the last 5 years.  His weight is 249 pounds which appears to be his dry weight.  Blood pressure is mildly elevated today and thus we Breyden Jeudy increase clonidine 0.2 mg twice daily.  5.  CKD stage III-IV: Followed by nephrology.  Currently on torsemide.  Current medicines are reviewed at length with the patient today.   The patient does not have concerns regarding his medicines.  The following changes were made today: Increase clonidine  Labs/ tests ordered today include:  Orders Placed This Encounter  Procedures  . EKG 12-Lead     Disposition:   FU with Riku Buttery 6 months  Signed, Jayliah Benett Meredith Leeds, MD  09/03/2019 9:51 AM     [facility] [address] [place] [address] [phone] (office) [phone] (fax)

## 2019-09-03 NOTE — Addendum Note (Signed)
Addended by: Stanton Kidney on: 09/03/2019 09:58 AM   Modules accepted: Orders

## 2019-09-03 NOTE — Patient Instructions (Signed)
Medication Instructions:  Your physician has recommended you make the following change in your medication:  1. INCREASE Clonidine to 0.2 mg twice a day  *If you need a refill on your cardiac medications before your next appointment, please call your pharmacy*   Lab Work: None ordered   Testing/Procedures: None ordered   Follow-Up: At [facility], you and your health needs are our priority.  As part of our continuing mission to provide you with exceptional heart care, we have created designated Provider Care Teams.  These Care Teams include your primary Cardiologist (physician) and Advanced Practice Providers (APPs -  Physician Assistants and Nurse Practitioners) who all work together to provide you with the care you need, when you need it.  We recommend signing up for the patient portal called "MyChart".  Sign up information is provided on this After Visit Summary.  MyChart is used to connect with patients for Virtual Visits (Telemedicine).  Patients are able to view lab/test results, encounter notes, upcoming appointments, etc.  Non-urgent messages can be sent to your provider as well.   To learn more about what you can do with MyChart, go to [email or web address].    Your next appointment:   6 month(s)  The format for your next appointment:   In Person  Provider:   You may see [name], MD or one of the following Advanced Practice Providers on your designated Care Team:    Chanetta Marshall, NP  Tommye Standard, PA-C  Legrand Como "[place]" [place], [place]    Thank you for choosing [facility]!!   [name], RN [phone]    Other Instructions

## 2019-09-04 DIAGNOSIS — C609 Malignant neoplasm of penis, unspecified: Secondary | ICD-10-CM | POA: Diagnosis not present

## 2019-09-04 DIAGNOSIS — N480 Leukoplakia of penis: Secondary | ICD-10-CM | POA: Diagnosis not present

## 2019-09-11 DIAGNOSIS — Z961 Presence of intraocular lens: Secondary | ICD-10-CM | POA: Diagnosis not present

## 2019-09-12 DIAGNOSIS — G4733 Obstructive sleep apnea (adult) (pediatric): Secondary | ICD-10-CM | POA: Diagnosis not present

## 2019-09-16 DIAGNOSIS — G4733 Obstructive sleep apnea (adult) (pediatric): Secondary | ICD-10-CM | POA: Diagnosis not present

## 2019-09-18 DIAGNOSIS — I5030 Unspecified diastolic (congestive) heart failure: Secondary | ICD-10-CM | POA: Diagnosis not present

## 2019-09-18 DIAGNOSIS — N041 Nephrotic syndrome with focal and segmental glomerular lesions: Secondary | ICD-10-CM | POA: Diagnosis not present

## 2019-09-18 DIAGNOSIS — G4733 Obstructive sleep apnea (adult) (pediatric): Secondary | ICD-10-CM | POA: Diagnosis not present

## 2019-09-18 DIAGNOSIS — N184 Chronic kidney disease, stage 4 (severe): Secondary | ICD-10-CM | POA: Diagnosis not present

## 2019-09-18 DIAGNOSIS — Z9989 Dependence on other enabling machines and devices: Secondary | ICD-10-CM | POA: Diagnosis not present

## 2019-09-18 DIAGNOSIS — I272 Pulmonary hypertension, unspecified: Secondary | ICD-10-CM | POA: Diagnosis not present

## 2019-09-18 DIAGNOSIS — I129 Hypertensive chronic kidney disease with stage 1 through stage 4 chronic kidney disease, or unspecified chronic kidney disease: Secondary | ICD-10-CM | POA: Diagnosis not present

## 2019-09-18 DIAGNOSIS — N179 Acute kidney failure, unspecified: Secondary | ICD-10-CM | POA: Diagnosis not present

## 2019-09-18 DIAGNOSIS — E1122 Type 2 diabetes mellitus with diabetic chronic kidney disease: Secondary | ICD-10-CM | POA: Diagnosis not present

## 2019-10-13 DIAGNOSIS — G4733 Obstructive sleep apnea (adult) (pediatric): Secondary | ICD-10-CM | POA: Diagnosis not present

## 2019-10-14 ENCOUNTER — Other Ambulatory Visit (HOSPITAL_COMMUNITY): Payer: Self-pay | Admitting: Physician Assistant

## 2019-11-08 DIAGNOSIS — Z6836 Body mass index (BMI) 36.0-36.9, adult: Secondary | ICD-10-CM | POA: Diagnosis not present

## 2019-11-08 DIAGNOSIS — N183 Chronic kidney disease, stage 3 unspecified: Secondary | ICD-10-CM | POA: Diagnosis not present

## 2019-11-08 DIAGNOSIS — E1122 Type 2 diabetes mellitus with diabetic chronic kidney disease: Secondary | ICD-10-CM | POA: Diagnosis not present

## 2019-11-08 DIAGNOSIS — N644 Mastodynia: Secondary | ICD-10-CM | POA: Diagnosis not present

## 2019-11-11 ENCOUNTER — Other Ambulatory Visit: Payer: Self-pay | Admitting: Family Medicine

## 2019-11-11 DIAGNOSIS — N644 Mastodynia: Secondary | ICD-10-CM

## 2019-11-12 DIAGNOSIS — G4733 Obstructive sleep apnea (adult) (pediatric): Secondary | ICD-10-CM | POA: Diagnosis not present

## 2019-11-13 DIAGNOSIS — G4733 Obstructive sleep apnea (adult) (pediatric): Secondary | ICD-10-CM | POA: Diagnosis not present

## 2019-11-15 ENCOUNTER — Other Ambulatory Visit: Payer: Self-pay | Admitting: Family Medicine

## 2019-11-15 DIAGNOSIS — N644 Mastodynia: Secondary | ICD-10-CM

## 2019-11-24 ENCOUNTER — Other Ambulatory Visit: Payer: Self-pay | Admitting: Cardiology

## 2019-11-25 NOTE — Telephone Encounter (Signed)
Prescription refill request for Xarelto received.   Last office visit: [place], 09/03/2019 Weight: 112 kg Age: 74 yo Scr: 1.77 08/21/2019 CrCl: 58 ml/min   Prescription refill sent.

## 2019-12-02 DIAGNOSIS — L723 Sebaceous cyst: Secondary | ICD-10-CM | POA: Diagnosis not present

## 2019-12-02 DIAGNOSIS — L570 Actinic keratosis: Secondary | ICD-10-CM | POA: Diagnosis not present

## 2019-12-02 DIAGNOSIS — B078 Other viral warts: Secondary | ICD-10-CM | POA: Diagnosis not present

## 2019-12-02 DIAGNOSIS — Z85828 Personal history of other malignant neoplasm of skin: Secondary | ICD-10-CM | POA: Diagnosis not present

## 2019-12-02 DIAGNOSIS — L814 Other melanin hyperpigmentation: Secondary | ICD-10-CM | POA: Diagnosis not present

## 2019-12-02 DIAGNOSIS — D225 Melanocytic nevi of trunk: Secondary | ICD-10-CM | POA: Diagnosis not present

## 2019-12-02 DIAGNOSIS — L578 Other skin changes due to chronic exposure to nonionizing radiation: Secondary | ICD-10-CM | POA: Diagnosis not present

## 2019-12-02 DIAGNOSIS — L219 Seborrheic dermatitis, unspecified: Secondary | ICD-10-CM | POA: Diagnosis not present

## 2019-12-02 DIAGNOSIS — L821 Other seborrheic keratosis: Secondary | ICD-10-CM | POA: Diagnosis not present

## 2019-12-03 ENCOUNTER — Ambulatory Visit: Payer: Medicare PPO

## 2019-12-03 ENCOUNTER — Ambulatory Visit
Admission: RE | Admit: 2019-12-03 | Discharge: 2019-12-03 | Disposition: A | Discharge status: Home / Self Care | Payer: Medicare PPO | Source: Ambulatory Visit | Attending: Family Medicine | Admitting: Family Medicine

## 2019-12-03 ENCOUNTER — Other Ambulatory Visit: Payer: Self-pay

## 2019-12-03 DIAGNOSIS — R928 Other abnormal and inconclusive findings on diagnostic imaging of breast: Secondary | ICD-10-CM | POA: Diagnosis not present

## 2019-12-03 DIAGNOSIS — N644 Mastodynia: Secondary | ICD-10-CM

## 2019-12-13 DIAGNOSIS — G4733 Obstructive sleep apnea (adult) (pediatric): Secondary | ICD-10-CM | POA: Diagnosis not present

## 2019-12-26 ENCOUNTER — Telehealth: Payer: Self-pay | Admitting: Cardiology

## 2019-12-26 MED ORDER — CLONIDINE HCL 0.1 MG PO TABS: 0.1000 mg | ORAL_TABLET | Freq: Two times a day (BID) | ORAL | 11 refills | Status: DC

## 2019-12-26 NOTE — Progress Notes (Signed)
CARDIOLOGY OFFICE NOTE  Date:  12/30/2019    Mark Singh Date of Birth: [birth year] Medical Record #[id number]  PCP:  Kathyrn Lass, MD  Cardiologist:  [place]  Chief Complaint  Patient presents with  . Irregular Heart Beat    Work in visit - seen for [name] & Marlou Porch    History of Present Illness: Mark Singh is a 74 y.o. male who presents today for a work in visit. Seen for Dr. Charlyne Mom.   He has a history of PVC ablation in 2016, AF/flutter with prior ablation x 2 in 2019, HTN, HLD, OSA on CPAP, DM, CKD and morbid obesity. He has been treated with amiodarone and has had noted bradycardia. Remains on Xarelto.   Last seen by Dr. Marlou Porch in April. Saw [name] in May. BP was up some at his last visit and Clonidine was increased.   Phone call last week - Patient's call sent straight to triage. Patient complaining of low HR 35 being the lowest.  Patient complaining of dizziness when standing and falling asleep if he sits for a short amount of time. This morning patient's BP 115/58 HR 41, patient stated he had just taken his morning medications.  Consulted [name], he recommends patient to reduce his clonidine to 0.1 mg BID until he is seen and has EKG. He recommends patient to come in to see someone and get an EKG. Scheduled patient to see Truitt Merle NP on Monday. If patient needs to follow up with [name] after he sees Cecille Rubin, [name] can see him on Thursday morning. Called patient with instructions and recommendations. Patient verbalized understanding.  Thus added to my schedule for today.   Comes in today. Here alone. He has been vaccinated for COVID 19. HR remains in the 40's. Does not really feel bad - says he feels fine. Was sending his diary to his PCP for his BP and blood sugar - that is why he was asked to call here and get his medicines adjusted. Seems to correlate with the increase in Clonidine from back in May. Transient  dizziness with standing up - only lasts for a few seconds. No chest pain. He actually thinks he is feeling the best he has "in many years" since he is staying in rhythm. He has no chest pain. Breathing is fine. He walks 3 to 4 miles at the park most days. He has not passed out. BP has been great by his diary. He notes that he did not cut his Clonidine back until last night - he had trouble getting this from the pharmacy so he had stayed on the 0.2 mg BID up until last night.   Past Medical History:  Diagnosis Date  . Arthritis    "hands, feet" (12/13/2017)  . Asthma   . Bradycardia    a. Repoted h/o HR in the 40s.  . Edema    a. 2D echo 11/2013: EF 96-29%, LV diastolic function parameters were normal, severely dilated LA, PASP 54mmHg..  . H/O: gout    right foot; on daily RX" (12/13/2017)  . HTN (hypertension)   . Hyperlipidemia   . Myalgia and myositis, unspecified   . Nephrotic syndrome    a. responder to steroid therapy by Dr. Lorrene Reid.   . Obesity   . Orthostatic hypotension   . Penile cancer ([place])    "did circ"  . Pulmonary hypertension ([place])    a. Mild pulm HTN felt 2/2  obesity.  [name] PVC's (premature ventricular contractions)    a. Holter 05/2013 showing 18k PVCs (18% of the time).  . Seasonal asthma    mild  . Shoulder pain   . Squamous carcinoma    "right hand; left shoulder" (12/13/2017)  . Type 2 diabetes mellitus ([place])     Past Surgical History:  Procedure Laterality Date  . A-FLUTTER ABLATION N/A 02/23/2018   Procedure: A-FLUTTER ABLATION;  Surgeon: Constance Haw, MD;  Location: [place] CV LAB;  Service: Cardiovascular;  Laterality: N/A;  . ATRIAL FIBRILLATION ABLATION N/A 12/13/2017   Procedure: ATRIAL FIBRILLATION ABLATION;  Surgeon: Constance Haw, MD;  Location: [place] CV LAB;  Service: Cardiovascular;  Laterality: N/A;  . BUNIONECTOMY WITH HAMMERTOE RECONSTRUCTION Left 1992  &  2008   REMOVAL HEAL SPUR/ BUNIONECTOMY AND HAMMERTOE REPAIR   .  CARDIOVERSION N/A 07/22/2015   Procedure: CARDIOVERSION;  Surgeon: Dorothy Spark, MD;  Location: [place];  Service: Cardiovascular;  Laterality: N/A;  . CARDIOVERSION N/A 10/06/2015   Procedure: CARDIOVERSION;  Surgeon: Sueanne Margarita, MD;  Location: [place];  Service: Cardiovascular;  Laterality: N/A;  . CARDIOVERSION N/A 12/05/2016   Procedure: CARDIOVERSION;  Surgeon: Pixie Casino, MD;  Location: [facility] ENDOSCOPY;  Service: Cardiovascular;  Laterality: N/A;  . CARDIOVERSION N/A 06/29/2018   Procedure: CARDIOVERSION;  Surgeon: Thayer Headings, MD;  Location: [facility] ENDOSCOPY;  Service: Cardiovascular;  Laterality: N/A;  . CARDIOVERSION N/A 09/20/2018   Procedure: CARDIOVERSION;  Surgeon: [name], MD;  Location: [place];  Service: Cardiovascular;  Laterality: N/A;  . CIRCUMCISION N/A 11/27/2012   Procedure:  CIRCUMCISION  AND EXCISION OF GLANS PENIS;  Surgeon: Fredricka Bonine, MD;  Location: [facility];  Service: Urology;  Laterality: N/A;  . ELECTROPHYSIOLOGIC STUDY N/A 03/30/2015   Procedure: PVC Ablation;  Surgeon: [name], MD;  Location: [place] CV LAB;  Service: Cardiovascular;  Laterality: N/A;  . HERNIA REPAIR    . SQUAMOUS CELL CARCINOMA EXCISION     "right hand; left shoulder" (12/13/2017)  . UMBILICAL HERNIA REPAIR  11-04-2005     Medications: Current Meds  Medication Sig  . amiodarone (PACERONE) 200 MG tablet TAKE 1 TABLET BY MOUTH EVERY DAY  . atorvastatin (LIPITOR) 40 MG tablet Take 40 mg by mouth daily.  . BD PEN NEEDLE NANO 2ND GEN 32G X 4 MM MISC   . betamethasone valerate ointment (VALISONE) 0.1 % APPLY TO AFFECTED AREA TWICE A DAY  . Cholecalciferol (VITAMIN D3) 5000 units CAPS Take 5,000 Units by mouth daily.   . cloNIDine (CATAPRES) 0.1 MG tablet Take 1 tablet (0.1 mg total) by mouth daily.  . Cyanocobalamin (VITAMIN B12) 3000 MCG SUBL Take 3,000 mcg by mouth daily.  [name] LANTUS SOLOSTAR 100 UNIT/ML Solostar Pen  Inject 40 Units into the skin at bedtime.   [name] omeprazole (PRILOSEC) 20 MG capsule Take 20 mg by mouth daily.  Glory Rosebush Delica Lancets 26R MISC USE AS DIRECTED EVERY DAY  . ONETOUCH VERIO test strip USE AS DIRECTED (TEST DAILY) 90 **E11.9  . pimecrolimus (ELIDEL) 1 % cream Apply 1 application topically 2 (two) times daily as needed (psoriasis).   . potassium chloride SA (KLOR-CON) 20 MEQ tablet Take 1 tablet (20 mEq total) by mouth daily.  . probenecid (BENEMID) 500 MG tablet Take 500 mg by mouth daily.   . tamsulosin (FLOMAX) 0.4 MG CAPS capsule Take 0.4 mg by mouth every evening.   . torsemide (DEMADEX) 20  MG tablet Take 3 tablets (60 mg total) by mouth daily.  Grant Ruts INHUB 100-50 MCG/DOSE AEPB Inhale 2 puffs into the lungs daily as needed.  Alveda Reasons 20 MG TABS tablet TAKE 1 TABLET (20 MG TOTAL) BY MOUTH DAILY WITH SUPPER.  . [DISCONTINUED] cloNIDine (CATAPRES) 0.1 MG tablet Take 1 tablet (0.1 mg total) by mouth 2 (two) times daily.     Allergies: Allergies  Allergen Reactions  . Indomethacin Other (See Comments)    dizziness    Social History: The patient  reports that he has never smoked. He has never used smokeless tobacco. He reports that he does not drink alcohol and does not use drugs.   Family History: The patient's family history includes CVA in his father; Coronary artery disease in his mother.   Review of Systems: Please see the history of present illness.   All other systems are reviewed and negative.   Physical Exam: VS:  BP (!) 130/58   Pulse (!) 45   Ht 5\' 9"  (1.753 m)   Wt 242 lb 3.2 oz (109.9 kg)   SpO2 96%   BMI 35.77 kg/m  .  BMI Body mass index is 35.77 kg/m.  Wt Readings from Last 3 Encounters:  12/30/19 242 lb 3.2 oz (109.9 kg)  09/03/19 247 lb (112 kg)  08/09/19 250 lb 9.6 oz (113.7 kg)    General: Alert and in no acute distress.  He is obese but weight is down 8 pounds since April.  Cardiac: Regular rate and rhythm. Rate is a little slow.  No murmurs, rubs, or gallops. No edema.  Respiratory:  Lungs are clear to auscultation bilaterally with normal work of breathing.  GI: Soft and nontender.  MS: No deformity or atrophy. Gait and ROM intact.  Skin: Warm and dry. Color is normal.  Neuro:  Strength and sensation are intact and no gross focal deficits noted.  Psych: Alert, appropriate and with normal affect.   LABORATORY DATA:  EKG:  EKG is ordered today.  Personally reviewed by me. This demonstrates sinus bradycardia - HR is 45.  Lab Results  Component Value Date   WBC 6.6 10/01/2018   HGB 11.0 (L) 10/01/2018   HCT 35.5 (L) 10/01/2018   PLT 227 10/01/2018   GLUCOSE 151 (H) 06/25/2019   ALT 11 06/17/2019   AST 17 06/17/2019   NA 145 (H) 06/25/2019   K 4.6 06/25/2019   CL 101 06/25/2019   CREATININE 2.22 (H) 06/25/2019   BUN 26 06/25/2019   CO2 28 06/25/2019   TSH 3.091 02/03/2015   INR 1.0 05/16/2008       BNP (last 3 results) No results for input(s): BNP in the last 8760 hours.  ProBNP (last 3 results) Recent Labs    06/17/19 1048  PROBNP 2,105*     Other Studies Reviewed Today:  ECHO IMPRESSIONS 06/2019  1. Left ventricular ejection fraction, by estimation, is 60 to 65%. The  left ventricle has normal function. The left ventricle has no regional  wall motion abnormalities. The left ventricular internal cavity size was  mildly dilated. There is moderate  concentric left ventricular hypertrophy. Left ventricular diastolic  parameters are indeterminate.  2. Right ventricular systolic function is normal. The right ventricular  size is severely enlarged. There is mildly elevated pulmonary artery  systolic pressure. The estimated right ventricular systolic pressure is  78.5 mmHg.  3. Left atrial size was moderately dilated.  4. Right atrial size was moderately dilated.  5.  The mitral valve is normal in structure and function. Trivial mitral  valve regurgitation. No evidence of mitral  stenosis.  6. Tricuspid valve regurgitation is mild to moderate.  7. The aortic valve is tricuspid. Aortic valve regurgitation is not  visualized. Mild aortic valve sclerosis is present, with no evidence of  aortic valve stenosis.  8. The inferior vena cava is dilated in size with <50% respiratory  variability, suggesting right atrial pressure of 15 mmHg.    ASSESSMENT & PLAN:     1. Profound bradycardia reported - HR is 45 here today - just cut his Clonidine back as of last night - would like to taper him off of this - he will do Clonidine 0.1 mg just once a day for the next 5 days and then STOP. Needs recheck of his lab today. Will get EP to see back. May at some point need reduction in amiodarone. No indication for PPM at this time but suspect he has some degree of SSS.    2. Persistent AF - on low dose amiodarone - prior ablation x 2 in 2019 - see above. He really likes maintaining NSR and actually feels good with no real complaint whatsoever.   3. OSA  4. Chronic anticoagulation - on Xarelto - no problems noted - needs labs today.   5. Prior PVC ablation in 2016 - not noted on today's EKG.   6. Chronic diastolic dysfunction/RV enlargement - BP is great - he is working on weight reduction.   7. Obesity - he is walking most days - continues to lose weight.   8. CKD - lab today.   9. HTN - BP is great - weaning Clonidine - also very successful with weight loss. He will continue to monitor for [place].   Current medicines are reviewed with the patient today.  The patient does not have concerns regarding medicines other than what has been noted above.  The following changes have been made:  See above.  Labs/ tests ordered today include:    Orders Placed This Encounter  Procedures  . Basic metabolic panel  . CBC  . Hepatic function panel  . TSH  . EKG 12-Lead     Disposition:   FU with [name] in about 2 to 3 weeks with repeat EKG - this will be off of Clonidine  altogether. He is to continue to monitor his BP for [place]. He may need additional agents.   Patient is agreeable to this plan and will call if any problems develop in the interim.   SignedTruitt Merle, NP  12/30/2019 9:07 AM  [facility] [address] [place], [place]  [address] Phone: [phone] Fax: [phone]

## 2019-12-26 NOTE — Telephone Encounter (Signed)
STAT if HR is under 50 or over 120 (normal HR is 60-100 beats per minute)  1) What is your heart rate? 39 when called, 48 after standing on the call   2) Do you have a log of your heart rate readings (document readings)?   12/17/19: 45, 43  12/18/19: 42, 43  12/19/19: 41,41  12/20/19: 41,42  12/21/19: 41,41  12/22/19: 39,41  12/23/19: 41,43  12/24/19: 44,42  12/25/19: 46,45  12/26/19: 41,44  3) Do you have any other symptoms? occasional dizziness upon standing

## 2019-12-26 NOTE — Telephone Encounter (Signed)
Patient's call sent straight to triage. Patient complaining of low HR 35 being the lowest.  Patient complaining of dizziness when standing and falling asleep if he sits for a short amount of time. This morning patient's BP 115/58 HR 41, patient stated he had just taken his morning medications.   Consulted [name], he recommends patient to reduce his clonidine to 0.1 mg BID until he is seen and has EKG. He recommends patient to come in to see someone and get an EKG. Scheduled patient to see Truitt Merle NP on Monday. If patient needs to follow up with [name] after he sees Cecille Rubin, [name] can see him on Thursday morning. Called patient with instructions and recommendations. Patient verbalized understanding.

## 2019-12-30 ENCOUNTER — Encounter: Payer: Self-pay | Admitting: Nurse Practitioner

## 2019-12-30 ENCOUNTER — Other Ambulatory Visit: Payer: Self-pay

## 2019-12-30 ENCOUNTER — Ambulatory Visit: Payer: Medicare PPO | Admitting: Nurse Practitioner

## 2019-12-30 VITALS — BP 130/58 | HR 45 | Ht 69.0 in | Wt 242.2 lb

## 2019-12-30 DIAGNOSIS — Z79899 Other long term (current) drug therapy: Secondary | ICD-10-CM

## 2019-12-30 DIAGNOSIS — I5032 Chronic diastolic (congestive) heart failure: Secondary | ICD-10-CM

## 2019-12-30 DIAGNOSIS — I10 Essential (primary) hypertension: Secondary | ICD-10-CM | POA: Diagnosis not present

## 2019-12-30 DIAGNOSIS — E7800 Pure hypercholesterolemia, unspecified: Secondary | ICD-10-CM

## 2019-12-30 DIAGNOSIS — I4819 Other persistent atrial fibrillation: Secondary | ICD-10-CM

## 2019-12-30 DIAGNOSIS — R001 Bradycardia, unspecified: Secondary | ICD-10-CM | POA: Diagnosis not present

## 2019-12-30 DIAGNOSIS — I493 Ventricular premature depolarization: Secondary | ICD-10-CM | POA: Diagnosis not present

## 2019-12-30 LAB — HEPATIC FUNCTION PANEL
ALT: 17 IU/L (ref 0–44)
AST: 20 IU/L (ref 0–40)
Albumin: 4.2 g/dL (ref 3.7–4.7)
Alkaline Phosphatase: 101 IU/L (ref 48–121)
Bilirubin Total: 0.4 mg/dL (ref 0.0–1.2)
Bilirubin, Direct: 0.15 mg/dL (ref 0.00–0.40)
Total Protein: 7.3 g/dL (ref 6.0–8.5)

## 2019-12-30 LAB — BASIC METABOLIC PANEL
BUN/Creatinine Ratio: 11 (ref 10–24)
BUN: 21 mg/dL (ref 8–27)
CO2: 31 mmol/L — ABNORMAL HIGH (ref 20–29)
Calcium: 9.4 mg/dL (ref 8.6–10.2)
Chloride: 97 mmol/L (ref 96–106)
Creatinine, Ser: 1.85 mg/dL — ABNORMAL HIGH (ref 0.76–1.27)
GFR calc Af Amer: 41 mL/min/{1.73_m2} — ABNORMAL LOW (ref >59)
GFR calc non Af Amer: 35 mL/min/{1.73_m2} — ABNORMAL LOW (ref >59)
Glucose: 198 mg/dL — ABNORMAL HIGH (ref 65–99)
Potassium: 4.2 mmol/L (ref 3.5–5.2)
Sodium: 140 mmol/L (ref 134–144)

## 2019-12-30 LAB — TSH: TSH: 7.6 u[IU]/mL — ABNORMAL HIGH (ref 0.450–4.500)

## 2019-12-30 LAB — CBC
Hematocrit: 40.3 % (ref 37.5–51.0)
Hemoglobin: 13.5 g/dL (ref 13.0–17.7)
MCH: 29.9 pg (ref 26.6–33.0)
MCHC: 33.5 g/dL (ref 31.5–35.7)
MCV: 89 fL (ref 79–97)
Platelets: 288 10*3/uL (ref 150–450)
RBC: 4.51 x10E6/uL (ref 4.14–5.80)
RDW: 13.2 % (ref 11.6–15.4)
WBC: 6.6 10*3/uL (ref 3.4–10.8)

## 2019-12-30 MED ORDER — CLONIDINE HCL 0.1 MG PO TABS: 0.1000 mg | ORAL_TABLET | Freq: Every day | ORAL | 11 refills | Status: DC

## 2019-12-30 NOTE — Patient Instructions (Addendum)
After Visit Summary:  We will be checking the following labs today - BMET, CBC, HPF and TSH   Medication Instructions:    Continue with your current medicines. BUT  I want you to take the Clonidine only once a day until Saturday and then STOP    If you need a refill on your cardiac medications before your next appointment, please call your pharmacy.     Testing/Procedures To Be Arranged:  N/A  Follow-Up:   See [name] in about 3 weeks with EKG    At [facility], you and your health needs are our priority.  As part of our continuing mission to provide you with exceptional heart care, we have created designated Provider Care Teams.  These Care Teams include your primary Cardiologist (physician) and Advanced Practice Providers (APPs -  Physician Assistants and Nurse Practitioners) who all work together to provide you with the care you need, when you need it.  Special Instructions:  . Stay safe, wash your hands for at least 20 seconds and wear a mask when needed.  . It was good to talk with you today.  . Continue to keep a check on your BP and HR for [place].    Call the [place] office at [phone] if you have any questions, problems or concerns.

## 2020-01-01 ENCOUNTER — Telehealth: Payer: Self-pay | Admitting: *Deleted

## 2020-01-01 MED ORDER — LEVOTHYROXINE SODIUM 25 MCG PO TABS: 25.0000 ug | ORAL_TABLET | Freq: Every day | ORAL | 0 refills | Status: DC

## 2020-01-01 NOTE — Telephone Encounter (Signed)
-----   Message from Burtis Junes, NP sent at 12/31/2019  7:25 AM EDT ----- Ok to report. Labs are stable - kidney function looks a little better. Thyroid level is elevated - probably needs replacement therapy due to his amiodarone. Start Synthroid 25 mcg daily. Needs follow up with PCP in 6 to 8 weeks for recheck. Copy of my note and labs to PCP please and otherwise, would continue on current regimen as outlined at his visit.

## 2020-01-01 NOTE — Telephone Encounter (Signed)
Call placed to pt regarding his lab results below.  See result note.

## 2020-01-07 ENCOUNTER — Telehealth: Payer: Self-pay | Admitting: Cardiology

## 2020-01-07 DIAGNOSIS — R946 Abnormal results of thyroid function studies: Secondary | ICD-10-CM | POA: Diagnosis not present

## 2020-01-07 NOTE — Telephone Encounter (Signed)
° ° °  Mark Singh from Dr. Ammie Ferrier office called, she said she will be sending labs to [name] today. Pt has subclinical hypothyroidism and does not need medication as of this time.

## 2020-01-09 ENCOUNTER — Other Ambulatory Visit: Payer: Self-pay

## 2020-01-09 ENCOUNTER — Encounter: Payer: Self-pay | Admitting: Cardiology

## 2020-01-09 ENCOUNTER — Ambulatory Visit: Payer: Medicare PPO | Admitting: Cardiology

## 2020-01-09 VITALS — BP 128/78 | HR 59 | Ht 69.0 in | Wt 239.8 lb

## 2020-01-09 DIAGNOSIS — I4819 Other persistent atrial fibrillation: Secondary | ICD-10-CM

## 2020-01-09 NOTE — Addendum Note (Signed)
Addended by: Allegra Lai M on: 01/09/2020 09:57 AM   Modules accepted: Level of Service

## 2020-01-09 NOTE — Patient Instructions (Addendum)
Medication Instructions:  Your physician recommends that you continue on your current medications as directed. Please refer to the Current Medication list given to you today.  *If you need a refill on your cardiac medications before your next appointment, please call your pharmacy*   Lab Work: None ordered   Testing/Procedures: None ordered   Follow-Up: At [facility], you and your health needs are our priority.  As part of our continuing mission to provide you with exceptional heart care, we have created designated Provider Care Teams.  These Care Teams include your primary Cardiologist (physician) and Advanced Practice Providers (APPs -  Physician Assistants and Nurse Practitioners) who all work together to provide you with the care you need, when you need it.  We recommend signing up for the patient portal called "MyChart".  Sign up information is provided on this After Visit Summary.  MyChart is used to connect with patients for Virtual Visits (Telemedicine).  Patients are able to view lab/test results, encounter notes, upcoming appointments, etc.  Non-urgent messages can be sent to your provider as well.   To learn more about what you can do with MyChart, go to [email or web address].    Your next appointment:   8 month(s)  The format for your next appointment:   In Person  Provider:   Allegra Lai, MD   You are scheduled for a nurse visit Tuesday, 9/14 @ 11:00 am.  We will check your blood pressure and blood pressure cuff.   Thank you for choosing [facility]!!   [name], RN [phone]    Other Instructions

## 2020-01-09 NOTE — Progress Notes (Addendum)
Electrophysiology Office Note   Date:  01/09/2020   ID:  Mark Singh, DOB 11-Nov-1945, MRN [id number]  PCP:  Kathyrn Lass, MD  Cardiologist:  Marlou Porch Primary Electrophysiologist:  Shaniqwa Horsman Meredith Leeds, MD    No chief complaint on file.    History of Present Illness: Mark Singh is a 74 y.o. male who presents today for electrophysiology evaluation.   He has a history of PVCs, nephrotic syndrome, diabetes, hypertension, hyperlipidemia, obesity, orthostatic hypotension, mild pulmonary hypertension.  He is status post PVC ablation.  PVCs were inferior to the left coronary cusp and ablation was performed 03/30/2015.  He subsequently went into atrial fibrillation and is status post ablation 12/13/2017.  He then represented in atrial flutter and is status post atrial flutter ablation 02/26/2018.  Today, denies symptoms of palpitations, chest pain, shortness of breath, orthopnea, PND, lower extremity edema, claudication, dizziness, presyncope, syncope, bleeding, or neurologic sequela. The patient is tolerating medications without difficulties.  At his last visit, his blood pressure was significantly elevated.  His clonidine dose was increased.  He was noted to have some bradycardia which made him feel weak and somewhat orthostatic.  His clonidine dose was stopped and his heart rate came back up blood pressures were significantly more elevated.  He has restarted clonidine back at 0.1 mg.  Past Medical History:  Diagnosis Date  . Arthritis    "hands, feet" (12/13/2017)  . Asthma   . Bradycardia    a. Repoted h/o HR in the 40s.  . Edema    a. 2D echo 11/2013: EF 00-93%, LV diastolic function parameters were normal, severely dilated LA, PASP 68mmHg..  . H/O: gout    right foot; on daily RX" (12/13/2017)  . HTN (hypertension)   . Hyperlipidemia   . Myalgia and myositis, unspecified   . Nephrotic syndrome    a. responder to steroid therapy by Dr. Lorrene Reid.   . Obesity   .  Orthostatic hypotension   . Penile cancer ([place])    "did circ"  . Pulmonary hypertension ([place])    a. Mild pulm HTN felt 2/2 obesity.  [name] PVC's (premature ventricular contractions)    a. Holter 05/2013 showing 18k PVCs (18% of the time).  . Seasonal asthma    mild  . Shoulder pain   . Squamous carcinoma    "right hand; left shoulder" (12/13/2017)  . Type 2 diabetes mellitus ([place])    Past Surgical History:  Procedure Laterality Date  . A-FLUTTER ABLATION N/A 02/23/2018   Procedure: A-FLUTTER ABLATION;  Surgeon: Constance Haw, MD;  Location: [place] [facility];  Service: Cardiovascular;  Laterality: N/A;  . ATRIAL FIBRILLATION ABLATION N/A 12/13/2017   Procedure: ATRIAL FIBRILLATION ABLATION;  Surgeon: Constance Haw, MD;  Location: [place] [facility];  Service: Cardiovascular;  Laterality: N/A;  . BUNIONECTOMY WITH HAMMERTOE RECONSTRUCTION Left 1992  &  2008   REMOVAL HEAL SPUR/ BUNIONECTOMY AND HAMMERTOE REPAIR   . CARDIOVERSION N/A 07/22/2015   Procedure: CARDIOVERSION;  Surgeon: Dorothy Spark, MD;  Location: [place];  Service: Cardiovascular;  Laterality: N/A;  . CARDIOVERSION N/A 10/06/2015   Procedure: CARDIOVERSION;  Surgeon: Sueanne Margarita, MD;  Location: [facility] ENDOSCOPY;  Service: Cardiovascular;  Laterality: N/A;  . CARDIOVERSION N/A 12/05/2016   Procedure: CARDIOVERSION;  Surgeon: Pixie Casino, MD;  Location: [facility] ENDOSCOPY;  Service: Cardiovascular;  Laterality: N/A;  . CARDIOVERSION N/A 06/29/2018   Procedure: CARDIOVERSION;  Surgeon: Thayer Headings, MD;  Location: [place];  Service: Cardiovascular;  Laterality: N/A;  . CARDIOVERSION N/A 09/20/2018   Procedure: CARDIOVERSION;  Surgeon: [name], MD;  Location: [place];  Service: Cardiovascular;  Laterality: N/A;  . CIRCUMCISION N/A 11/27/2012   Procedure:  CIRCUMCISION  AND EXCISION OF GLANS PENIS;  Surgeon: Fredricka Bonine, MD;  Location: [facility];  Service: Urology;   Laterality: N/A;  . ELECTROPHYSIOLOGIC STUDY N/A 03/30/2015   Procedure: PVC Ablation;  Surgeon: Cambell Rickenbach Meredith Leeds, MD;  Location: [place] [facility];  Service: Cardiovascular;  Laterality: N/A;  . HERNIA REPAIR    . SQUAMOUS CELL CARCINOMA EXCISION     "right hand; left shoulder" (12/13/2017)  . UMBILICAL HERNIA REPAIR  11-04-2005     Current Outpatient Medications  Medication Sig Dispense Refill  . amiodarone (PACERONE) 200 MG tablet TAKE 1 TABLET BY MOUTH EVERY DAY 90 tablet 1  . atorvastatin (LIPITOR) 40 MG tablet Take 40 mg by mouth daily.    . BD PEN NEEDLE NANO 2ND GEN 32G X 4 MM MISC     . betamethasone valerate ointment (VALISONE) 0.1 % APPLY TO AFFECTED AREA TWICE A DAY    . Cholecalciferol (VITAMIN D3) 5000 units CAPS Take 5,000 Units by mouth daily.     . cloNIDine (CATAPRES) 0.1 MG tablet Take 1 tablet (0.1 mg total) by mouth daily. 60 tablet 11  . Cyanocobalamin (VITAMIN B12) 3000 MCG SUBL Take 3,000 mcg by mouth daily.    [name] LANTUS SOLOSTAR 100 UNIT/ML Solostar Pen Inject 40 Units into the skin at bedtime.   0  . levothyroxine (SYNTHROID) 25 MCG tablet Take 1 tablet (25 mcg total) by mouth daily before breakfast. 90 tablet 0  . omeprazole (PRILOSEC) 20 MG capsule Take 20 mg by mouth daily.    Glory Rosebush Delica Lancets 12X MISC USE AS DIRECTED EVERY DAY    . ONETOUCH VERIO test strip USE AS DIRECTED (TEST DAILY) 90 **E11.9    . pimecrolimus (ELIDEL) 1 % cream Apply 1 application topically 2 (two) times daily as needed (psoriasis).     . potassium chloride SA (KLOR-CON) 20 MEQ tablet Take 1 tablet (20 mEq total) by mouth daily. 90 tablet 3  . probenecid (BENEMID) 500 MG tablet Take 500 mg by mouth daily.     . tamsulosin (FLOMAX) 0.4 MG CAPS capsule Take 0.4 mg by mouth every evening.   4  . torsemide (DEMADEX) 20 MG tablet Take 3 tablets (60 mg total) by mouth daily. 270 tablet 3  . WIXELA INHUB 100-50 MCG/DOSE AEPB Inhale 2 puffs into the lungs daily as needed.    Alveda Reasons 20 MG TABS tablet TAKE 1 TABLET (20 MG TOTAL) BY MOUTH DAILY WITH SUPPER. 90 tablet 1   No current facility-administered medications for this visit.    Allergies:   Indomethacin   Social History:  The patient  reports that he has never smoked. He has never used smokeless tobacco. He reports that he does not drink alcohol and does not use drugs.   Family History:  The patient's family history includes CVA in his father; Coronary artery disease in his mother.   ROS:  Please see the history of present illness.   Otherwise, review of systems is positive for none.   All other systems are reviewed and negative.   PHYSICAL EXAM: VS:  BP 128/78   Pulse (!) 59   Ht 5\' 9"  (1.753 m)   Wt 239 lb 12.8 oz (108.8 kg)   SpO2  95%   BMI 35.41 kg/m  , BMI Body mass index is 35.41 kg/m. GEN: Well nourished, well developed, in no acute distress  HEENT: normal  Neck: no JVD, carotid bruits, or masses Cardiac: RRR; no murmurs, rubs, or gallops,no edema  Respiratory:  clear to auscultation bilaterally, normal work of breathing GI: soft, nontender, nondistended, + BS MS: no deformity or atrophy  Skin: warm and dry Neuro:  Strength and sensation are intact Psych: euthymic mood, full affect  EKG:  EKG is ordered today. Personal review of the ekg ordered shows ectopic atrial rhythm, rate 59  Recent Labs: 06/17/2019: NT-Pro BNP 2,105 12/30/2019: ALT 17; BUN 21; Creatinine, Ser 1.85; Hemoglobin 13.5; Platelets 288; Potassium 4.2; Sodium 140; TSH 7.600    Lipid Panel  No results found for: CHOL, TRIG, HDL, CHOLHDL, VLDL, LDLCALC, LDLDIRECT   Wt Readings from Last 3 Encounters:  01/09/20 239 lb 12.8 oz (108.8 kg)  12/30/19 242 lb 3.2 oz (109.9 kg)  09/03/19 247 lb (112 kg)      Other studies Reviewed: Additional studies/ records that were reviewed today include: TTE 12/28/14  Review of the above records today demonstrates:  - Left ventricle: The cavity size was normal. There was  mild concentric hypertrophy. Systolic function was normal. The estimated ejection fraction was in the range of 55% to 60%. Although no diagnostic regional wall motion abnormality was identified, this possibility cannot be completely excluded on the basis of this study. Left ventricular diastolic function parameters were normal. - Left atrium: The atrium was severely dilated. - Pulmonary arteries: Systolic pressure was mildly increased. PA peak pressure: 38 mm Hg (S).   ASSESSMENT AND PLAN:  1.  PVCs: Status post ablation 03/22/2015.  No obvious recurrence.   2.  Persistent atrial fibrillation: Currently on Xarelto and amiodarone (medication monitoring performed for this high risk medication).  CHA2DS2-VASc of 3.  Status post ablation 12/13/2017.  Currently on amiodarone, remains in sinus rhythm   3.  Typical atrial flutter: Status post ablation 02/26/2018 without recurrence.    4.  Chronic diastolic heart failure: No obvious volume overload  5.  CKD stage IV-V: Followed by nephrology.  Currently on torsemide.  6.  Hypertension: Blood pressure is normal today but has been significantly elevated at home.  We Arabell Neria have him bring his blood pressure cuff into the office to check against our recordings.  If this shows elevated blood pressures at home, hydralazine 50 mg would be a reasonable option.  Current medicines are reviewed at length with the patient today.   The patient does not have concerns regarding his medicines.  The following changes were made today: none  Labs/ tests ordered today include:  Orders Placed This Encounter  Procedures  . EKG 12-Lead     Disposition:   FU with Shimeka Bacot 6 months  Signed, Edwing Figley Meredith Leeds, MD  01/09/2020 8:44 AM     [facility] [address] [place] [address] [phone] (office) [phone] (fax)

## 2020-01-13 DIAGNOSIS — G4733 Obstructive sleep apnea (adult) (pediatric): Secondary | ICD-10-CM | POA: Diagnosis not present

## 2020-01-14 ENCOUNTER — Ambulatory Visit (INDEPENDENT_AMBULATORY_CARE_PROVIDER_SITE_OTHER): Payer: Medicare PPO | Admitting: *Deleted

## 2020-01-14 ENCOUNTER — Other Ambulatory Visit: Payer: Self-pay

## 2020-01-14 VITALS — BP 173/79 | HR 53 | Ht 69.0 in

## 2020-01-14 DIAGNOSIS — I10 Essential (primary) hypertension: Secondary | ICD-10-CM | POA: Diagnosis not present

## 2020-01-14 MED ORDER — HYDRALAZINE HCL 50 MG PO TABS: 50.0000 mg | ORAL_TABLET | Freq: Two times a day (BID) | ORAL | 6 refills | Status: DC

## 2020-01-14 NOTE — Progress Notes (Signed)
1.  Reason for visit: BP check  2.  Name of MD requesting visit:  Camnitz  3. H&P:  See epic  4.  ROS related to problem:  See spic  5.  Assessment and plan per MD:   Pt came in today to check his BP cuff against manual measurement, to ensure he is getting accurate numbers at home. pts personal cuff is measuring correctly. Pt hypertensive today 173/79. Per [name] -- start Hydralazine 50 twice daily

## 2020-01-14 NOTE — Patient Instructions (Addendum)
Medication Instructions:  Your physician has recommended you make the following change in your medication:  1. START Hydralazine 50 mg twice a day  *If you need a refill on your cardiac medications before your next appointment, please call your pharmacy*   Lab Work: None ordered If you have labs (blood work) drawn today and your tests are completely normal, you will receive your results only by: [name] MyChart Message (if you have MyChart) OR . A paper copy in the mail If you have any lab test that is abnormal or we need to change your treatment, we will call you to review the results.   Testing/Procedures: None ordered   Follow-Up: At [facility], you and your health needs are our priority.  As part of our continuing mission to provide you with exceptional heart care, we have created designated Provider Care Teams.  These Care Teams include your primary Cardiologist (physician) and Advanced Practice Providers (APPs -  Physician Assistants and Nurse Practitioners) who all work together to provide you with the care you need, when you need it.  We recommend signing up for the patient portal called "MyChart".  Sign up information is provided on this After Visit Summary.  MyChart is used to connect with patients for Virtual Visits (Telemedicine).  Patients are able to view lab/test results, encounter notes, upcoming appointments, etc.  Non-urgent messages can be sent to your provider as well.   To learn more about what you can do with MyChart, go to [email or web address].    Your next appointment:    Keep your current follow up in November  The format for your next appointment:   In Person  Provider:   Tommye Standard, PA-C   Thank you for choosing [facility]!!   [name], RN [phone]    Other Instructions Hydralazine tablets What is this medicine? HYDRALAZINE (hye DRAL a zeen) is a type of vasodilator. It relaxes blood vessels, increasing the blood and oxygen  supply to your heart. This medicine is used to treat high blood pressure. This medicine may be used for other purposes; ask your health care provider or pharmacist if you have questions. COMMON BRAND NAME(S): Apresoline What should I tell my health care provider before I take this medicine? They need to know if you have any of these conditions:  blood vessel disease  heart disease including angina or history of heart attack  kidney or liver disease  systemic lupus erythematosus (SLE)  an unusual or allergic reaction to hydralazine, tartrazine dye, other medicines, foods, dyes, or preservatives  pregnant or trying to get pregnant  breast-feeding How should I use this medicine? Take this medicine by mouth with a glass of water. Follow the directions on the prescription label. Take your doses at regular intervals. Do not take your medicine more often than directed. Do not stop taking except on the advice of your doctor or health care professional. Talk to your pediatrician regarding the use of this medicine in children. Special care may be needed. While this drug may be prescribed for children for selected conditions, precautions do apply. Overdosage: If you think you have taken too much of this medicine contact a poison control center or emergency room at once. NOTE: This medicine is only for you. Do not share this medicine with others. What if I miss a dose? If you miss a dose, take it as soon as you can. If it is almost time for your next dose, take only that dose. Do not  take double or extra doses. What may interact with this medicine?  medicines for high blood pressure  medicines for mental depression This list may not describe all possible interactions. Give your health care provider a list of all the medicines, herbs, non-prescription drugs, or dietary supplements you use. Also tell them if you smoke, drink alcohol, or use illegal drugs. Some items may interact with your  medicine. What should I watch for while using this medicine? Visit your doctor or health care professional for regular checks on your progress. Check your blood pressure and pulse rate regularly. Ask your doctor or health care professional what your blood pressure and pulse rate should be and when you should contact him or her. You may get drowsy or dizzy. Do not drive, use machinery, or do anything that needs mental alertness until you know how this medicine affects you. Do not stand or sit up quickly, especially if you are an older patient. This reduces the risk of dizzy or fainting spells. Alcohol may interfere with the effect of this medicine. Avoid alcoholic drinks. Do not treat yourself for coughs, colds, or pain while you are taking this medicine without asking your doctor or health care professional for advice. Some ingredients may increase your blood pressure. What side effects may I notice from receiving this medicine? Side effects that you should report to your doctor or health care professional as soon as possible:  chest pain, or fast or irregular heartbeat  fever, chills, or sore throat  numbness or tingling in the hands or feet  shortness of breath  skin rash, redness, blisters or itching  stiff or swollen joints  sudden weight gain  swelling of the feet or legs  swollen lymph glands  unusual weakness Side effects that usually do not require medical attention (report to your doctor or health care professional if they continue or are bothersome):  diarrhea, or constipation  headache  loss of appetite  nausea, vomiting This list may not describe all possible side effects. Call your doctor for medical advice about side effects. You may report side effects to FDA at 1-800-FDA-1088. Where should I keep my medicine? Keep out of the reach of children. Store at room temperature between 15 and 30 degrees C (59 and 86 degrees F). Throw away any unused medicine after the  expiration date. NOTE: This sheet is a summary. It may not cover all possible information. If you have questions about this medicine, talk to your doctor, pharmacist, or health care provider.  2020 [employer] (2007-08-31 15:44:58)

## 2020-01-20 NOTE — Telephone Encounter (Signed)
Hydralazine started 9/14 for HTN. Want to continue to monitor for another week/so and send in numbers again?

## 2020-01-28 DIAGNOSIS — I10 Essential (primary) hypertension: Secondary | ICD-10-CM

## 2020-01-30 DIAGNOSIS — I272 Pulmonary hypertension, unspecified: Secondary | ICD-10-CM | POA: Diagnosis not present

## 2020-01-30 DIAGNOSIS — N179 Acute kidney failure, unspecified: Secondary | ICD-10-CM | POA: Diagnosis not present

## 2020-01-30 DIAGNOSIS — G4733 Obstructive sleep apnea (adult) (pediatric): Secondary | ICD-10-CM | POA: Diagnosis not present

## 2020-01-30 DIAGNOSIS — I129 Hypertensive chronic kidney disease with stage 1 through stage 4 chronic kidney disease, or unspecified chronic kidney disease: Secondary | ICD-10-CM | POA: Diagnosis not present

## 2020-01-30 DIAGNOSIS — Z9989 Dependence on other enabling machines and devices: Secondary | ICD-10-CM | POA: Diagnosis not present

## 2020-01-30 DIAGNOSIS — I5030 Unspecified diastolic (congestive) heart failure: Secondary | ICD-10-CM | POA: Diagnosis not present

## 2020-01-30 DIAGNOSIS — N041 Nephrotic syndrome with focal and segmental glomerular lesions: Secondary | ICD-10-CM | POA: Diagnosis not present

## 2020-01-30 DIAGNOSIS — N184 Chronic kidney disease, stage 4 (severe): Secondary | ICD-10-CM | POA: Diagnosis not present

## 2020-01-30 DIAGNOSIS — E1122 Type 2 diabetes mellitus with diabetic chronic kidney disease: Secondary | ICD-10-CM | POA: Diagnosis not present

## 2020-02-03 ENCOUNTER — Other Ambulatory Visit: Payer: Self-pay

## 2020-02-03 ENCOUNTER — Ambulatory Visit (INDEPENDENT_AMBULATORY_CARE_PROVIDER_SITE_OTHER): Payer: Medicare PPO | Admitting: Pharmacist Clinician (PhC)/ Clinical Pharmacy Specialist

## 2020-02-03 DIAGNOSIS — I10 Essential (primary) hypertension: Secondary | ICD-10-CM | POA: Diagnosis not present

## 2020-02-03 MED ORDER — AMLODIPINE BESYLATE 5 MG PO TABS: 5.0000 mg | ORAL_TABLET | Freq: Every day | ORAL | 3 refills | Status: DC

## 2020-02-03 NOTE — Assessment & Plan Note (Signed)
Patient with systolic hypertension, currently not well controlled.  He was not able to tolerate the 100 mg doses of hydralazine, and cannot increase losartan or add diuretics due to kidney function.  Would ideally like to get him off clonidine due to dry mouth and age issues.  Will have him start amlodipine 5 mg daily and continue with all other medications for now.  He should continue with twice daily BP monitoring and let us know of any concerns.  Hopefully we can get him controlled and try weaning off the clonidine again.   He is scheduled to see Tommye Standard in about a month, so we will have her monitor his pressure at that time.  We can see him later for continued follow up if needed.

## 2020-02-03 NOTE — Progress Notes (Signed)
02/03/2020 Mark Singh Sep 05, 1945 [id number]   HPI:  Mark Singh is a 74 y.o. male patient of Dr Curt Bears, with a PMH below who presents today for hypertension clinic evaluation.  He was seen by [name] last month, who weaned him off clonidine, however he found that his pressure increased significantly, so he restarted it at 0.1 mg bid, instead of 0.2 bid.  Patient notes that he developed hypertension about the time he was diagnosed with atrial fibrillation.  He felt it to be well controlled for some time, but has been creeping higher in the past couple of months.  He also recently was seen by Dr. Carollee Leitz in nephrology, who started him on losartan 25 mg daily.  He will have repeat blood work done next week.   Past Medical History: hyperlipidemia 03/2019 TC 149, TG 154, HDL 43, LDL 75  - on atorvastatin 40  CKD  Stage 4-5, GFR 35 CrCl 35 w/IBW -on torsemide 60 mg qd  Atrial fibrillation CHADS2-VASc score 3(age, htn, DM) , on Xarelto  asthma On Wixela Inhub  DM2 4/21 A1c 8.2, On Lantus 40 U hs  hypothyroidism TSH 9/21 5.67, on levothyroxine 25 mcg     Blood Pressure Goal:  130/80  Current Medications: clonidine 0.1 mg bid, hydralazine 50 mg bid, losartan 25 mg qd  Family Hx: father had stroke at 11; mother w/o heart issues died at 33; 2 sister with heart disease; no children  Social Hx: no, no, occasional Coke  Diet: mix of home, eating out - consists of cafeteria, sit down restaurant; cut out [employer]; no added salt; not so much with fruits or vegetables; grazes more at home than full meals  Exercise: walk daily, 5-6 days; at least 4 miles, close to 2 hours; rides stationary bike occasionally  Home BP readings:  11 morning readings from past 2 weeks average 152/66, 7 evening readings average 154/62    Intolerances: indomethacin   Labs: 12/30/2019: Na 140, K 4.2, Glu 198, BUN 21, SCr 1.85  GFR 35  Wt Readings from Last 3 Encounters:  02/03/20 248 lb  12.8 oz (112.9 kg)  01/09/20 239 lb 12.8 oz (108.8 kg)  12/30/19 242 lb 3.2 oz (109.9 kg)   BP Readings from Last 3 Encounters:  02/03/20 138/66  01/14/20 (!) 173/79  01/09/20 128/78   Pulse Readings from Last 3 Encounters:  02/03/20 (!) 57  01/14/20 (!) 53  01/09/20 (!) 59    Current Outpatient Medications  Medication Sig Dispense Refill  . amiodarone (PACERONE) 200 MG tablet TAKE 1 TABLET BY MOUTH EVERY DAY 90 tablet 1  . atorvastatin (LIPITOR) 40 MG tablet Take 40 mg by mouth daily.    . BD PEN NEEDLE NANO 2ND GEN 32G X 4 MM MISC     . betamethasone valerate ointment (VALISONE) 0.1 % APPLY TO AFFECTED AREA TWICE A DAY    . Cholecalciferol (VITAMIN D3) 5000 units CAPS Take 5,000 Units by mouth daily.     . cloNIDine (CATAPRES) 0.1 MG tablet Take 1 tablet (0.1 mg total) by mouth daily. (Patient taking differently: Take 0.1 mg by mouth 2 (two) times daily. Pt taking differently -- taking BID) 60 tablet 11  . Cyanocobalamin (VITAMIN B12) 3000 MCG SUBL Take 3,000 mcg by mouth daily.    . hydrALAZINE (APRESOLINE) 50 MG tablet Take 1 tablet (50 mg total) by mouth in the morning and at bedtime. 60 tablet 6  . LANTUS SOLOSTAR 100 UNIT/ML Solostar  Pen Inject 40 Units into the skin at bedtime.   0  . losartan (COZAAR) 25 MG tablet Take 25 mg by mouth daily.    [name] omeprazole (PRILOSEC) 20 MG capsule Take 20 mg by mouth daily.    Glory Rosebush Delica Lancets 40G MISC USE AS DIRECTED EVERY DAY    . ONETOUCH VERIO test strip USE AS DIRECTED (TEST DAILY) 90 **E11.9    . pimecrolimus (ELIDEL) 1 % cream Apply 1 application topically 2 (two) times daily as needed (psoriasis).     . potassium chloride SA (KLOR-CON) 20 MEQ tablet Take 1 tablet (20 mEq total) by mouth daily. 90 tablet 3  . probenecid (BENEMID) 500 MG tablet Take 500 mg by mouth daily.     . tamsulosin (FLOMAX) 0.4 MG CAPS capsule Take 0.4 mg by mouth every evening.   4  . torsemide (DEMADEX) 20 MG tablet Take 3 tablets (60 mg total) by  mouth daily. 270 tablet 3  . WIXELA INHUB 100-50 MCG/DOSE AEPB Inhale 2 puffs into the lungs daily as needed.    Alveda Reasons 20 MG TABS tablet TAKE 1 TABLET (20 MG TOTAL) BY MOUTH DAILY WITH SUPPER. 90 tablet 1  . amLODipine (NORVASC) 5 MG tablet Take 1 tablet (5 mg total) by mouth daily. 30 tablet 3   No current facility-administered medications for this visit.    Allergies  Allergen Reactions  . Indomethacin Other (See Comments)    dizziness    Past Medical History:  Diagnosis Date  . Arthritis    "hands, feet" (12/13/2017)  . Asthma   . Bradycardia    a. Repoted h/o HR in the 40s.  . Edema    a. 2D echo 11/2013: EF 86-76%, LV diastolic function parameters were normal, severely dilated LA, PASP 59mmHg..  . H/O: gout    right foot; on daily RX" (12/13/2017)  . HTN (hypertension)   . Hyperlipidemia   . Myalgia and myositis, unspecified   . Nephrotic syndrome    a. responder to steroid therapy by Dr. Lorrene Reid.   . Obesity   . Orthostatic hypotension   . Penile cancer ([place])    "did circ"  . Pulmonary hypertension ([place])    a. Mild pulm HTN felt 2/2 obesity.  [name] PVC's (premature ventricular contractions)    a. Holter 05/2013 showing 18k PVCs (18% of the time).  . Seasonal asthma    mild  . Shoulder pain   . Squamous carcinoma    "right hand; left shoulder" (12/13/2017)  . Type 2 diabetes mellitus ([facility])     Blood pressure 138/66, pulse (!) 57, resp. rate 16, height 5\' 9"  (1.753 m), weight 248 lb 12.8 oz (112.9 kg), SpO2 97 %.  Essential hypertension, benign Patient with systolic hypertension, currently not well controlled.  He was not able to tolerate the 100 mg doses of hydralazine, and cannot increase losartan or add diuretics due to kidney function.  Would ideally like to get him off clonidine due to dry mouth and age issues.  Will have him start amlodipine 5 mg daily and continue with all other medications for now.  He should continue with twice daily BP monitoring and let us  know of any concerns.  Hopefully we can get him controlled and try weaning off the clonidine again.   He is scheduled to see Tommye Standard in about a month, so we will have her monitor his pressure at that time.  We can see him later for continued follow  up if needed.     Tommy Medal PharmD CPP [facility] [address] [place], [place] [address] [phone]

## 2020-02-03 NOTE — Patient Instructions (Signed)
Check your blood pressure at home twice daily and keep record of the readings.  Take your BP meds as follows:  Start amlodipine 5 mg once daily (morning or evening)  Continue with all other medication  Bring all of your meds, your BP cuff and your record of home blood pressures to your next appointment.  Exercise as youre able, try to walk approximately 30 minutes per day.  Keep salt intake to a minimum, especially watch canned and prepared boxed foods.  Eat more fresh fruits and vegetables and fewer canned items.  Avoid eating in fast food restaurants.    HOW TO TAKE YOUR BLOOD PRESSURE:  Rest 5 minutes before taking your blood pressure.   Dont smoke or drink caffeinated beverages for at least 30 minutes before.  Take your blood pressure before (not after) you eat.  Sit comfortably with your back supported and both feet on the floor (dont cross your legs).  Elevate your arm to heart level on a table or a desk.  Use the proper sized cuff. It should fit smoothly and snugly around your bare upper arm. There should be enough room to slip a fingertip under the cuff. The bottom edge of the cuff should be 1 inch above the crease of the elbow.  Ideally, take 3 measurements at one sitting and record the average.

## 2020-02-05 ENCOUNTER — Ambulatory Visit: Payer: Medicare PPO | Admitting: Cardiology

## 2020-02-05 DIAGNOSIS — N184 Chronic kidney disease, stage 4 (severe): Secondary | ICD-10-CM | POA: Diagnosis not present

## 2020-02-05 DIAGNOSIS — I129 Hypertensive chronic kidney disease with stage 1 through stage 4 chronic kidney disease, or unspecified chronic kidney disease: Secondary | ICD-10-CM | POA: Diagnosis not present

## 2020-02-11 DIAGNOSIS — G4733 Obstructive sleep apnea (adult) (pediatric): Secondary | ICD-10-CM | POA: Diagnosis not present

## 2020-02-12 DIAGNOSIS — G4733 Obstructive sleep apnea (adult) (pediatric): Secondary | ICD-10-CM | POA: Diagnosis not present

## 2020-03-01 NOTE — Progress Notes (Signed)
Cardiology Office Note Date:  03/01/2020  Patient ID:  Mark Singh, DOB 06-06-1945, MRN [id number] PCP:  Kathyrn Lass, MD  Cardiologist:  Dr. Marlou Porch Electrophysiologist: [name] Nephrology: Dr. Marval Regal   Chief Complaint:  Planned follow up, BP  History of Present Illness: Mark Singh is a 74 y.o. male with history of PVC's, bradycardia, AFib, nephrotic syndrome (complete responder to steroid therapy by Dr. Lorrene Reid) CKD (III-IV), DM, HTN, HLD, obesity, orthostatic hypotension, and mild pulm HTN felt 2/2 obesity.  He comes in today to be seen for [name].  He was seen by Dr Marlou Porch April 2021, new to amiodarone, had lost weight likely combination of diuretic and increased activity. Following with nephrology, no changes were made.  He last saw [name] [phone], was doing well, his clonidine dose had recently been stopped 2/2 bradycardia and was stopped, the pt noted elevated BP and resumed at 0.1mg .  His BP was OK at the visit, was asked to keep track of his BP and bring his cuff to the next visit to compare and eval his BP, consideration for hydralazine if needed.  He had subsequently been started on hydralazine with increasing doses as well as losartan (via nephrology), his BP not well controlled and at his last visit with [facility], mentions side effects with hydralazine 100mg  dose, and clonidine with dry mouth etc.  Amlodipine was added.  With hopes of being able to get him off the clonidine, 2/2 side effects   TODAY He feels fairly well. No symptoms of AFib, no CP or cardiac awareness. No DOE. He wakes at night to use the bathroom, and sometimes with some condensation form his CPAP machine, though not for any kind of SOB. NO near syncope or syncope.  He reports he felt dizzy on the 100mg  of hydralazine, better on the 50mg  dose.  Denies any bleeding or signs of bleeding  He brings BP/HR readings from home HRs are 40's-50's BPs largely  130-140's/60's, a few higher and lower   AFib Hx: Found Feb 2017, incidentally noted, asymptomatic, started on a/c DCCV 07/22/15, successful AAD tx: Flecainide May 2017 DCCV 10/06/15, successful 12/01/16 found in typical AFlutter (asymptomatic), flecainide up-titrated 12/05/16: DCCV, successful 12/13/2017; PVI ablation 02/23/2018: CTI ablation 06/18/2019 Flecainide stopped 2/2 LVH on his echo >> started on amiodarone   Past Medical History:  Diagnosis Date  . Arthritis    "hands, feet" (12/13/2017)  . Asthma   . Bradycardia    a. Repoted h/o HR in the 40s.  . Edema    a. 2D echo 11/2013: EF 85-88%, LV diastolic function parameters were normal, severely dilated LA, PASP 82mmHg..  . H/O: gout    right foot; on daily RX" (12/13/2017)  . HTN (hypertension)   . Hyperlipidemia   . Myalgia and myositis, unspecified   . Nephrotic syndrome    a. responder to steroid therapy by Dr. Lorrene Reid.   . Obesity   . Orthostatic hypotension   . Penile cancer ([place])    "did circ"  . Pulmonary hypertension ([place])    a. Mild pulm HTN felt 2/2 obesity.  [name] PVC's (premature ventricular contractions)    a. Holter 05/2013 showing 18k PVCs (18% of the time).  . Seasonal asthma    mild  . Shoulder pain   . Squamous carcinoma    "right hand; left shoulder" (12/13/2017)  . Type 2 diabetes mellitus ([place])     Past Surgical History:  Procedure Laterality Date  . A-FLUTTER ABLATION N/A  02/23/2018   Procedure: A-FLUTTER ABLATION;  Surgeon: Constance Haw, MD;  Location: [facility];  Service: Cardiovascular;  Laterality: N/A;  . ATRIAL FIBRILLATION ABLATION N/A 12/13/2017   Procedure: ATRIAL FIBRILLATION ABLATION;  Surgeon: Constance Haw, MD;  Location: [facility];  Service: Cardiovascular;  Laterality: N/A;  . BUNIONECTOMY WITH HAMMERTOE RECONSTRUCTION Left 1992  &  2008   REMOVAL HEAL SPUR/ BUNIONECTOMY AND HAMMERTOE REPAIR   . CARDIOVERSION N/A 07/22/2015   Procedure: CARDIOVERSION;   Surgeon: Dorothy Spark, MD;  Location: [facility];  Service: Cardiovascular;  Laterality: N/A;  . CARDIOVERSION N/A 10/06/2015   Procedure: CARDIOVERSION;  Surgeon: Sueanne Margarita, MD;  Location: [facility];  Service: Cardiovascular;  Laterality: N/A;  . CARDIOVERSION N/A 12/05/2016   Procedure: CARDIOVERSION;  Surgeon: Pixie Casino, MD;  Location[facility];  Service: Cardiovascular;  Laterality: N/A;  . CARDIOVERSION N/A 06/29/2018   Procedure: CARDIOVERSION;  Surgeon: Thayer Headings, MD;  Location[facility] END[facility];  Service: Cardiovascular;  Laterality: N/A;  . CARDIOVERSION N/A 09/20/2018   Procedure: CARDIOVERSION;  Surgeon: [name], MD;  Location: [facility];  Service: Cardiovascular;  Laterality: N/A;  . CIRCUMCISION N/A 11/27/2012   Procedure:  CIRCUMCISION  AND EXCISION OF GLANS PENIS;  Surgeon: Fredricka Bonine, MD;  Location[facility];  Service: Urology;  Laterality: N/A;  . ELECTROPHYSIOLOGIC STUDY N/A 03/30/2015   Procedure: PVC Ablation;  Surgeon: [name], MD;  Location: [facility];  Service: Cardiovascular;  Laterality: N/A;  . HERNIA REPAIR    . SQUAMOUS CELL CARCINOMA EXCISION     "right hand; left shoulder" (12/13/2017)  . UMBILICAL HERNIA REPAIR  11-04-2005    Current Outpatient Medications  Medication Sig Dispense Refill  . amiodarone (PACERONE) 200 MG tablet TAKE 1 TABLET BY MOUTH EVERY DAY 90 tablet 1  . amLODipine (NORVASC) 5 MG tablet Take 1 tablet (5 mg total) by mouth daily. 30 tablet 3  . atorvastatin (LIPITOR) 40 MG tablet Take 40 mg by mouth daily.    . BD PEN NEEDLE NANO 2ND GEN 32G X 4 MM MISC     . betamethasone valerate ointment (VALISONE) 0.1 % APPLY TO AFFECTED AREA TWICE A DAY    . Cholecalciferol (VITAMIN D3) 5000 units CAPS Take 5,000 Units by mouth daily.     . cloNIDine (CATAPRES) 0.1 MG tablet Take 1 tablet (0.1 mg total) by mouth daily. (Patient taking differently: Take 0.1 mg by mouth  2 (two) times daily. Pt taking differently -- taking BID) 60 tablet 11  . Cyanocobalamin (VITAMIN B12) 3000 MCG SUBL Take 3,000 mcg by mouth daily.    . hydrALAZINE (APRESOLINE) 50 MG tablet Take 1 tablet (50 mg total) by mouth in the morning and at bedtime. 60 tablet 6  . LANTUS SOLOSTAR 100 UNIT/ML Solostar Pen Inject 40 Units into the skin at bedtime.   0  . losartan (COZAAR) 25 MG tablet Take 25 mg by mouth daily.    [name] omeprazole (PRILOSEC) 20 MG capsule Take 20 mg by mouth daily.    Glory Rosebush Delica Lancets 93T MISC USE AS DIRECTED EVERY DAY    . ONETOUCH VERIO test strip USE AS DIRECTED (TEST DAILY) 90 **E11.9    . pimecrolimus (ELIDEL) 1 % cream Apply 1 application topically 2 (two) times daily as needed (psoriasis).     . potassium chloride SA (KLOR-CON) 20 MEQ tablet Take 1 tablet (20 mEq total) by mouth daily.  90 tablet 3  . probenecid (BENEMID) 500 MG tablet Take 500 mg by mouth daily.     . tamsulosin (FLOMAX) 0.4 MG CAPS capsule Take 0.4 mg by mouth every evening.   4  . torsemide (DEMADEX) 20 MG tablet Take 3 tablets (60 mg total) by mouth daily. 270 tablet 3  . WIXELA INHUB 100-50 MCG/DOSE AEPB Inhale 2 puffs into the lungs daily as needed.    Alveda Reasons 20 MG TABS tablet TAKE 1 TABLET (20 MG TOTAL) BY MOUTH DAILY WITH SUPPER. 90 tablet 1   No current facility-administered medications for this visit.    Allergies:   Indomethacin   Social History:  The patient  reports that he has never smoked. He has never used smokeless tobacco. He reports that he does not drink alcohol and does not use drugs.   Family History:  The patient's family history includes CVA in his father; Coronary artery disease in his mother.  ROS:  Please see the history of present illness.  All other systems are reviewed and otherwise negative.   PHYSICAL EXAM:  VS:  There were no vitals taken for this visit. BMI: There is no height or weight on file to calculate BMI. Well nourished, well developed, in no  acute distress  HEENT: normocephalic, atraumatic  Neck: no JVD, carotid bruits or masses Cardiac:  RRR; no significant murmurs, no rubs, or gallops Lungs:  CTA b/l, no wheezing, rhonchi or rales  Abd: soft, nontender MS: no deformity or atrophy Ext:  Trace edema Skin: warm and dry, no rash Neuro:  No gross deficits appreciated Psych: euthymic mood, full affect  EKG: not done today  06/18/2019 TTE IMPRESSIONS  1. Left ventricular ejection fraction, by estimation, is 60 to 65%. The  left ventricle has normal function. The left ventricle has no regional  wall motion abnormalities. The left ventricular internal cavity size was  mildly dilated. There is moderate  concentric left ventricular hypertrophy. Left ventricular diastolic  parameters are indeterminate.  2. Right ventricular systolic function is normal. The right ventricular  size is severely enlarged. There is mildly elevated pulmonary artery  systolic pressure. The estimated right ventricular systolic pressure is  54.0 mmHg.  3. Left atrial size was moderately dilated.  4. Right atrial size was moderately dilated.  5. The mitral valve is normal in structure and function. Trivial mitral  valve regurgitation. No evidence of mitral stenosis.  6. Tricuspid valve regurgitation is mild to moderate.  7. The aortic valve is tricuspid. Aortic valve regurgitation is not  visualized. Mild aortic valve sclerosis is present, with no evidence of  aortic valve stenosis.  8. The inferior vena cava is dilated in size with <50% respiratory  variability, suggesting right atrial pressure of 15 mmHg.     02/23/2018; EPS/Ablation  CONCLUSIONS:   1. Isthmus-dependent right atrial flutter upon presentation.   2. Successful radiofrequency ablation of atrial flutter along the cavotricuspid isthmus with complete bidirectional isthmus block achieved.   3. No inducible arrhythmias following ablation.   4. No early apparent complications.     12/13/2017: EPS/Ablation CONCLUSIONS: 1. Atrial fibrillation upon presentation.   2. Successful electrical isolation and anatomical encircling of all four pulmonary veins with radiofrequency current.  A WACA approach was used 3. Additional left atrial ablation was performed across the roof of the left atrium 4. Atrial fibrillation successfully cardioverted to sinus rhythm. 5. No early apparent complications.   10/20/15: EST  There was no ST segment deviation noted during  stress.  There were no adverse arrhythmias during exercise. QRS did not widen. Isolated PVC noted  Low risk flecainide exercise challenge   03/30/15: PROCEDURE: 1. Comprehensive EP study. 2. Left atrial and left ventricular pacing and recording. 3. A 3D mapping of PVCs. 4. Radiofrequency ablation of PVC. 5. Arterial blood pressure monitoring. 6. Intracardiac echocardiography  02/04/15: 48 hour holter   53000 PVC's   Rare ventricular triplet   Ventricular bigeminy 990   Longest pause 2.2 seconds   Mean heart rate 78bpm, lowest 39bpm at 4:18am. With frequent PVCs, 53,000 total, please have electrophysiology evaluate. [name], MD  12/27/13: Echocardiogram Study Conclusions - Left ventricle: The cavity size was normal. There was mild concentric hypertrophy. Systolic function was normal. The estimated ejection fraction was in the range of 55% to 60%. Although no diagnostic regional wall motion abnormality was identified, this possibility cannot be completely excluded on the basis of this study. Left ventricular diastolic function parameters were normal. - Left atrium: The atrium was severely dilated. - Pulmonary arteries: Systolic pressure was mildly increased. PA peak pressure: 38 mm Hg (S).  Recent Labs: 06/17/2019: NT-Pro BNP 2,105 12/30/2019: ALT 17; BUN 21; Creatinine, Ser 1.85; Hemoglobin 13.5; Platelets 288; Potassium 4.2; Sodium 140; TSH 7.600   Wt Readings from  Last 3 Encounters:  02/03/20 248 lb 12.8 oz (112.9 kg)  01/09/20 239 lb 12.8 oz (108.8 kg)  12/30/19 242 lb 3.2 oz (109.9 kg)     Other studies reviewed: Additional studies/records reviewed today include: summarized above   ASSESSMENT AND PLAN:  1. PVC's     S/p ablation 03/30/15 with [name]   2. Persistent AFib, Aflutter     S/p PVI and CTI ablation     CHA2DS2Vasc is 4, on Xarelto, appropriately dosed, by last labs     On amiodarone     No symptoms to suggest recurrent arrhythmia     TSH has wobbled some, repeat today  3. Chronic CHF (diastolic)      No symptoms or exam findings of volume OL      C/w nephrology and Dr. Marlou Porch  4. HTN     Not quite to goal      Increase his Amlodipine to 10mg  daily  Wean off clonidine 0.1mg  daily for 5 days then QOD for 5 days then stop He will keep track of his BP as he has been Will be seeing Dr. Marlou Porch 03/13/20 and hopefully BP looks OK without the clonidine  Disposition: See EP in 6 mo, sooner if needed  Current medicines are reviewed at length with the patient today.  The patient did not have any concerns regarding medicines.  Haywood Lasso, PA-C 03/01/2020 6:08 PM     [address] [place] [address] [phone] (office)  [phone] (fax)

## 2020-03-02 ENCOUNTER — Encounter: Payer: Self-pay | Admitting: Physician Assistant

## 2020-03-02 ENCOUNTER — Other Ambulatory Visit: Payer: Self-pay

## 2020-03-02 ENCOUNTER — Ambulatory Visit: Payer: Medicare PPO | Admitting: Physician Assistant

## 2020-03-02 VITALS — BP 146/60 | HR 58 | Ht 69.0 in | Wt 253.0 lb

## 2020-03-02 DIAGNOSIS — I4819 Other persistent atrial fibrillation: Secondary | ICD-10-CM

## 2020-03-02 DIAGNOSIS — I10 Essential (primary) hypertension: Secondary | ICD-10-CM

## 2020-03-02 DIAGNOSIS — Z79899 Other long term (current) drug therapy: Secondary | ICD-10-CM | POA: Diagnosis not present

## 2020-03-02 DIAGNOSIS — I5032 Chronic diastolic (congestive) heart failure: Secondary | ICD-10-CM

## 2020-03-02 DIAGNOSIS — I493 Ventricular premature depolarization: Secondary | ICD-10-CM

## 2020-03-02 MED ORDER — AMLODIPINE BESYLATE 10 MG PO TABS: 10.0000 mg | ORAL_TABLET | Freq: Every day | ORAL | 1 refills | Status: DC

## 2020-03-02 MED ORDER — CLONIDINE HCL 0.1 MG PO TABS: 0.1000 mg | ORAL_TABLET | Freq: Every day | ORAL | 11 refills | Status: DC

## 2020-03-02 NOTE — Patient Instructions (Signed)
Medication Instructions:   1. START TAKING:    AMLODIPINE 10 MG ONCE A DAY   2. START TAKING:    CLONIDINE 0.1 MG (  ONCE A DAY FOR 5 DAYS THEN EVERY OTHER FOR  5 DAYS THEN STOP  ONLY IF BLOOD PRESSURE IS BELOW 140/80 CONSISTENTLY)  *If you need a refill on your cardiac medications before your next appointment, please call your pharmacy*   Lab Work:  TSH TODAY   If you have labs (blood work) drawn today and your tests are completely normal, you will receive your results only by:  MyChart Message (if you have MyChart) OR  A paper copy in the mail If you have any lab test that is abnormal or we need to change your treatment, we will call you to review the results.   Testing/Procedures: NONE ORDERED  TODAY   Follow-Up: At [facility], you and your health needs are our priority.  As part of our continuing mission to provide you with exceptional heart care, we have created designated Provider Care Teams.  These Care Teams include your primary Cardiologist (physician) and Advanced Practice Providers (APPs -  Physician Assistants and Nurse Practitioners) who all work together to provide you with the care you need, when you need it.  We recommend signing up for the patient portal called "MyChart".  Sign up information is provided on this After Visit Summary.  MyChart is used to connect with patients for Virtual Visits (Telemedicine).  Patients are able to view lab/test results, encounter notes, upcoming appointments, etc.  Non-urgent messages can be sent to your provider as well.   To learn more about what you can do with MyChart, go to [email or web address].    Your next appointment:  AS SCHEUDLED  Provider:   [name], MD   Other Instructions

## 2020-03-03 ENCOUNTER — Other Ambulatory Visit: Payer: Self-pay | Admitting: *Deleted

## 2020-03-03 DIAGNOSIS — Z79899 Other long term (current) drug therapy: Secondary | ICD-10-CM

## 2020-03-03 LAB — TSH: TSH: 8.02 u[IU]/mL — ABNORMAL HIGH (ref 0.450–4.500)

## 2020-03-03 MED ORDER — AMIODARONE HCL 100 MG PO TABS
100.0000 mg | ORAL_TABLET | Freq: Every day | ORAL | 3 refills | Status: DC
Start: 2020-03-03 — End: 2020-07-20

## 2020-03-09 DIAGNOSIS — Z1389 Encounter for screening for other disorder: Secondary | ICD-10-CM | POA: Diagnosis not present

## 2020-03-09 DIAGNOSIS — Z1211 Encounter for screening for malignant neoplasm of colon: Secondary | ICD-10-CM | POA: Diagnosis not present

## 2020-03-09 DIAGNOSIS — Z0000 Encounter for general adult medical examination without abnormal findings: Secondary | ICD-10-CM | POA: Diagnosis not present

## 2020-03-11 DIAGNOSIS — Z1211 Encounter for screening for malignant neoplasm of colon: Secondary | ICD-10-CM | POA: Diagnosis not present

## 2020-03-13 ENCOUNTER — Encounter: Payer: Self-pay | Admitting: Cardiology

## 2020-03-13 ENCOUNTER — Ambulatory Visit: Payer: Medicare PPO | Admitting: Cardiology

## 2020-03-13 ENCOUNTER — Other Ambulatory Visit: Payer: Self-pay

## 2020-03-13 VITALS — BP 120/30 | HR 68 | Ht 69.0 in | Wt 251.0 lb

## 2020-03-13 DIAGNOSIS — I10 Essential (primary) hypertension: Secondary | ICD-10-CM

## 2020-03-13 DIAGNOSIS — I4819 Other persistent atrial fibrillation: Secondary | ICD-10-CM

## 2020-03-13 DIAGNOSIS — I493 Ventricular premature depolarization: Secondary | ICD-10-CM

## 2020-03-13 MED ORDER — LOSARTAN POTASSIUM 50 MG PO TABS: 50.0000 mg | ORAL_TABLET | Freq: Every day | ORAL | 3 refills | Status: DC

## 2020-03-13 NOTE — Progress Notes (Signed)
Cardiology Office Note:    Date:  03/13/2020   ID:  Mark Singh, DOB Aug 26, 1945, MRN [id number]  PCP:  Kathyrn Lass, MD  [facility] Cardiologist:  [name], MD  [facility] Electrophysiologist:  [name], MD   Referring MD: Kathyrn Lass, MD     History of Present Illness:    Mark Singh is a 74 y.o. male here for follow up of PVC's, post ablation.   Overall doing well denies any fevers chills nausea vomiting syncope bleeding.  See below for details.  Past Medical History:  Diagnosis Date  . Arthritis    "hands, feet" (12/13/2017)  . Asthma   . Bradycardia    a. Repoted h/o HR in the 40s.  . Edema    a. 2D echo 11/2013: EF 83-15%, LV diastolic function parameters were normal, severely dilated LA, PASP 66mmHg..  . H/O: gout    right foot; on daily RX" (12/13/2017)  . HTN (hypertension)   . Hyperlipidemia   . Myalgia and myositis, unspecified   . Nephrotic syndrome    a. responder to steroid therapy by Dr. Lorrene Reid.   . Obesity   . Orthostatic hypotension   . Penile cancer ([place])    "did circ"  . Pulmonary hypertension ([place])    a. Mild pulm HTN felt 2/2 obesity.  [name] PVC's (premature ventricular contractions)    a. Holter 05/2013 showing 18k PVCs (18% of the time).  . Seasonal asthma    mild  . Shoulder pain   . Squamous carcinoma    "right hand; left shoulder" (12/13/2017)  . Type 2 diabetes mellitus ([place])     Past Surgical History:  Procedure Laterality Date  . A-FLUTTER ABLATION N/A 02/23/2018   Procedure: A-FLUTTER ABLATION;  Surgeon: Constance Haw, MD;  Location: [place] [facility];  Service: Cardiovascular;  Laterality: N/A;  . ATRIAL FIBRILLATION ABLATION N/A 12/13/2017   Procedure: ATRIAL FIBRILLATION ABLATION;  Surgeon: Constance Haw, MD;  Location: [place] [facility];  Service: Cardiovascular;  Laterality: N/A;  . BUNIONECTOMY WITH HAMMERTOE RECONSTRUCTION Left 1992  &  2008   REMOVAL HEAL SPUR/  BUNIONECTOMY AND HAMMERTOE REPAIR   . CARDIOVERSION N/A 07/22/2015   Procedure: CARDIOVERSION;  Surgeon: Dorothy Spark, MD;  Location: [place];  Service: Cardiovascular;  Laterality: N/A;  . CARDIOVERSION N/A 10/06/2015   Procedure: CARDIOVERSION;  Surgeon: Sueanne Margarita, MD;  Location: [place];  Service: Cardiovascular;  Laterality: N/A;  . CARDIOVERSION N/A 12/05/2016   Procedure: CARDIOVERSION;  Surgeon: Pixie Casino, MD;  Location: [facility];  Service: Cardiovascular;  Laterality: N/A;  . CARDIOVERSION N/A 06/29/2018   Procedure: CARDIOVERSION;  Surgeon: Thayer Headings, MD;  Location: [facility] ENDOSCOPY;  Service: Cardiovascular;  Laterality: N/A;  . CARDIOVERSION N/A 09/20/2018   Procedure: CARDIOVERSION;  Surgeon: [name], MD;  Location: [place];  Service: Cardiovascular;  Laterality: N/A;  . CIRCUMCISION N/A 11/27/2012   Procedure:  CIRCUMCISION  AND EXCISION OF GLANS PENIS;  Surgeon: Fredricka Bonine, MD;  Location: [facility];  Service: Urology;  Laterality: N/A;  . ELECTROPHYSIOLOGIC STUDY N/A 03/30/2015   Procedure: PVC Ablation;  Surgeon: [name], MD;  Location: [place] [facility];  Service: Cardiovascular;  Laterality: N/A;  . HERNIA REPAIR    . SQUAMOUS CELL CARCINOMA EXCISION     "right hand; left shoulder" (12/13/2017)  . UMBILICAL HERNIA REPAIR  11-04-2005    Current Medications: Current Meds  Medication Sig  .  amiodarone (PACERONE) 100 MG tablet Take 1 tablet (100 mg total) by mouth daily.  [name] amLODipine (NORVASC) 10 MG tablet Take 1 tablet (10 mg total) by mouth daily.  [name] atorvastatin (LIPITOR) 40 MG tablet Take 40 mg by mouth daily.  . BD PEN NEEDLE NANO 2ND GEN 32G X 4 MM MISC   . betamethasone valerate ointment (VALISONE) 0.1 % APPLY TO AFFECTED AREA TWICE A DAY  . Cholecalciferol (VITAMIN D3) 5000 units CAPS Take 5,000 Units by mouth daily.   . cloNIDine (CATAPRES) 0.1 MG tablet Take 1 tablet (0.1 mg total)  by mouth daily. (03-02-20)ONCE A DAY FOR 5 DAYS THEN EVERY OTHER FOR  5 DAYS THEN STOP  ONLY IF BLOOD PRESSURE IS BELOW 140/80 CONSISTENTLY)  . Cyanocobalamin (VITAMIN B12) 3000 MCG SUBL Take 3,000 mcg by mouth daily.  . hydrALAZINE (APRESOLINE) 50 MG tablet Take 1 tablet (50 mg total) by mouth in the morning and at bedtime.  [name] LANTUS SOLOSTAR 100 UNIT/ML Solostar Pen Inject 40 Units into the skin at bedtime.   [name] omeprazole (PRILOSEC) 20 MG capsule Take 20 mg by mouth daily.  Glory Rosebush Delica Lancets 10C MISC USE AS DIRECTED EVERY DAY  . ONETOUCH VERIO test strip USE AS DIRECTED (TEST DAILY) 90 **E11.9  . pimecrolimus (ELIDEL) 1 % cream Apply 1 application topically 2 (two) times daily as needed (psoriasis).   . potassium chloride SA (KLOR-CON) 20 MEQ tablet Take 1 tablet (20 mEq total) by mouth daily.  . probenecid (BENEMID) 500 MG tablet Take 500 mg by mouth daily.   . tamsulosin (FLOMAX) 0.4 MG CAPS capsule Take 0.4 mg by mouth every evening.   . torsemide (DEMADEX) 20 MG tablet Take 3 tablets (60 mg total) by mouth daily.  Grant Ruts INHUB 100-50 MCG/DOSE AEPB Inhale 2 puffs into the lungs daily as needed.  Alveda Reasons 20 MG TABS tablet TAKE 1 TABLET (20 MG TOTAL) BY MOUTH DAILY WITH SUPPER.  . [DISCONTINUED] losartan (COZAAR) 25 MG tablet Take 25 mg by mouth daily.     Allergies:   Indomethacin   Social History   Socioeconomic History  . Marital status: Widowed    Spouse name: Not on file  . Number of children: Not on file  . Years of education: Not on file  . Highest education level: Not on file  Occupational History  . Not on file  Tobacco Use  . Smoking status: Never Smoker  . Smokeless tobacco: Never Used  Vaping Use  . Vaping Use: Never used  Substance and Sexual Activity  . Alcohol use: Never  . Drug use: Never  . Sexual activity: Not Currently  Other Topics Concern  . Not on file  Social History Narrative  . Not on file   Social Determinants of Health   Financial  Resource Strain:   . Difficulty of Paying Living Expenses: Not on file  Food Insecurity:   . Worried About Charity fundraiser in the Last Year: Not on file  . Ran Out of Food in the Last Year: Not on file  Transportation Needs:   . Lack of Transportation (Medical): Not on file  . Lack of Transportation (Non-Medical): Not on file  Physical Activity:   . Days of Exercise per Week: Not on file  . Minutes of Exercise per Session: Not on file  Stress:   . Feeling of Stress : Not on file  Social Connections:   . Frequency of Communication with Friends and Family: Not  on file  . Frequency of Social Gatherings with Friends and Family: Not on file  . Attends Religious Services: Not on file  . Active Member of Clubs or Organizations: Not on file  . Attends Archivist Meetings: Not on file  . Marital Status: Not on file     Family History: The patient's family history includes CVA in his father; Coronary artery disease in his mother.  ROS:   Please see the history of present illness.    No fevers chills nausea vomiting syncope bleeding all other systems reviewed and are negative.  EKGs/Labs/Other Studies Reviewed:    The following studies were reviewed today:  ECHO 06/2019: 1. Left ventricular ejection fraction, by estimation, is 60 to 65%. The  left ventricle has normal function. The left ventricle has no regional  wall motion abnormalities. The left ventricular internal cavity size was  mildly dilated. There is moderate  concentric left ventricular hypertrophy. Left ventricular diastolic  parameters are indeterminate.  2. Right ventricular systolic function is normal. The right ventricular  size is severely enlarged. There is mildly elevated pulmonary artery  systolic pressure. The estimated right ventricular systolic pressure is  09.8 mmHg.  3. Left atrial size was moderately dilated.  4. Right atrial size was moderately dilated.  5. The mitral valve is normal in  structure and function. Trivial mitral  valve regurgitation. No evidence of mitral stenosis.  6. Tricuspid valve regurgitation is mild to moderate.  7. The aortic valve is tricuspid. Aortic valve regurgitation is not  visualized. Mild aortic valve sclerosis is present, with no evidence of  aortic valve stenosis.  8. The inferior vena cava is dilated in size with <50% respiratory  variability, suggesting right atrial pressure of 15 mmHg.    Recent Labs: 06/17/2019: NT-Pro BNP 2,105 12/30/2019: ALT 17; BUN 21; Creatinine, Ser 1.85; Hemoglobin 13.5; Platelets 288; Potassium 4.2; Sodium 140 03/02/2020: TSH 8.020  Recent Lipid Panel No results found for: CHOL, TRIG, HDL, CHOLHDL, VLDL, LDLCALC, LDLDIRECT   Risk Assessment/Calculations:     CHA2DS2-VASc Score = 4  This indicates a 4.8% annual risk of stroke. The patient's score is based upon: CHF History: 0 HTN History: 1 Diabetes History: 1 Stroke History: 0 Vascular Disease History: 0 Age Score: 2 Gender Score: 0      Physical Exam:    VS:  BP (!) 120/30   Pulse 68   Ht 5\' 9"  (1.753 m)   Wt 251 lb (113.9 kg)   SpO2 96%   BMI 37.07 kg/m     Wt Readings from Last 3 Encounters:  03/13/20 251 lb (113.9 kg)  03/02/20 253 lb (114.8 kg)  02/03/20 248 lb 12.8 oz (112.9 kg)     GEN:  Well nourished, well developed in no acute distress HEENT: Normal NECK: No JVD; No carotid bruits LYMPHATICS: No lymphadenopathy CARDIAC: RRR, 2/6 systolic murmur, no rubs, gallops RESPIRATORY:  Clear to auscultation without rales, wheezing or rhonchi  ABDOMEN: Soft, non-tender, non-distended MUSCULOSKELETAL:  Mild LE edema; No deformity  SKIN: Warm and dry NEUROLOGIC:  Alert and oriented x 3 PSYCHIATRIC:  Normal affect   ASSESSMENT:    1. Persistent atrial fibrillation ([place])   2. PVC's (premature ventricular contractions)   3. Essential hypertension, benign    PLAN:    In order of problems listed above:  Premature ventricular  contractions -Had ablation performed in 2016.  No obvious recurrence.  Excellent. -[name] note from September 2021 reviewed. -Has had bradycardia with  heart rates in the 40s.  Fairly asymptomatic.  Sometimes may feel some dizziness when standing up lasting a few seconds.  Clonidine was cut back and heart rate seemed to improve.  Persistent atrial fibrillation -On Xarelto and amiodarone.  Continuing with monitoring for high risk medication.  CHA2DS2-VASc of 3. -Had atrial fibrillation ablation on 12/13/2017.  Remaining in sinus rhythm.  Typical atrial flutter -Post ablation 2019 without recurrence.  Elevated TSH -Rechecking in December.  Amiodarone was decreased to 100 mg a day.  Chronic kidney disease stage IV/V -Followed by nephrology, next appt Feb.   Utilizing torsemide for edema. -His right ventricle also is enlarged which is contributing to his edema.  Essential hypertension -Continuing to monitor blood pressures at home.  Have been labile in the past.  Numbers are usually in the 150s at home.  Here today was 130. -I will increase losartan from 25 up to 50 mg a day.  In December when he checks his TSH we will check a basic metabolic profile as well.  Willing to tolerate a slight increase in his creatinine. -Tapering off clonidine because of bradycardia. -Continue with hydralazine and amlodipine.  Diabetes with hypertension -Hemoglobin A1c in the 8 range.  Dr. Sabra Heck has been monitoring.  Continue work on diet exercise.    Shared Decision Making/Informed Consent      Medication Adjustments/Labs and Tests Ordered: Current medicines are reviewed at length with the patient today.  Concerns regarding medicines are outlined above.  Orders Placed This Encounter  Procedures  . Basic metabolic panel   Meds ordered this encounter  Medications  . losartan (COZAAR) 50 MG tablet    Sig: Take 1 tablet (50 mg total) by mouth daily.    Dispense:  90 tablet    Refill:  3     Patient Instructions  Medication Instructions:  Please increase Losartan to 50 mg a day. Continue all other medications as listed.  *If you need a refill on your cardiac medications before your next appointment, please call your pharmacy*  Lab Work: Please have blood work as scheduled 04/02/2020.  If you have labs (blood work) drawn today and your tests are completely normal, you will receive your results only by: [name] MyChart Message (if you have MyChart) OR . A paper copy in the mail If you have any lab test that is abnormal or we need to change your treatment, we will call you to review the results.  Follow-Up: At [facility], you and your health needs are our priority.  As part of our continuing mission to provide you with exceptional heart care, we have created designated Provider Care Teams.  These Care Teams include your primary Cardiologist (physician) and Advanced Practice Providers (APPs -  Physician Assistants and Nurse Practitioners) who all work together to provide you with the care you need, when you need it.  We recommend signing up for the patient portal called "MyChart".  Sign up information is provided on this After Visit Summary.  MyChart is used to connect with patients for Virtual Visits (Telemedicine).  Patients are able to view lab/test results, encounter notes, upcoming appointments, etc.  Non-urgent messages can be sent to your provider as well.   To learn more about what you can do with MyChart, go to [email or web address].    Your next appointment:   6 month(s)  The format for your next appointment:   In Person  Provider:   [name], MD  Thank you for choosing [facility]!!  Signed, [name], MD  03/13/2020 9:28 AM    [facility]

## 2020-03-13 NOTE — Patient Instructions (Signed)
Medication Instructions:  Please increase Losartan to 50 mg a day. Continue all other medications as listed.  *If you need a refill on your cardiac medications before your next appointment, please call your pharmacy*  Lab Work: Please have blood work as scheduled 04/02/2020.  If you have labs (blood work) drawn today and your tests are completely normal, you will receive your results only by: [name] MyChart Message (if you have MyChart) OR . A paper copy in the mail If you have any lab test that is abnormal or we need to change your treatment, we will call you to review the results.  Follow-Up: At [facility], you and your health needs are our priority.  As part of our continuing mission to provide you with exceptional heart care, we have created designated Provider Care Teams.  These Care Teams include your primary Cardiologist (physician) and Advanced Practice Providers (APPs -  Physician Assistants and Nurse Practitioners) who all work together to provide you with the care you need, when you need it.  We recommend signing up for the patient portal called "MyChart".  Sign up information is provided on this After Visit Summary.  MyChart is used to connect with patients for Virtual Visits (Telemedicine).  Patients are able to view lab/test results, encounter notes, upcoming appointments, etc.  Non-urgent messages can be sent to your provider as well.   To learn more about what you can do with MyChart, go to [email or web address].    Your next appointment:   6 month(s)  The format for your next appointment:   In Person  Provider:   [name], MD  Thank you for choosing [facility]!!

## 2020-03-14 DIAGNOSIS — G4733 Obstructive sleep apnea (adult) (pediatric): Secondary | ICD-10-CM | POA: Diagnosis not present

## 2020-03-20 DIAGNOSIS — K5900 Constipation, unspecified: Secondary | ICD-10-CM | POA: Diagnosis not present

## 2020-03-20 DIAGNOSIS — R195 Other fecal abnormalities: Secondary | ICD-10-CM | POA: Diagnosis not present

## 2020-03-20 DIAGNOSIS — K219 Gastro-esophageal reflux disease without esophagitis: Secondary | ICD-10-CM | POA: Diagnosis not present

## 2020-03-20 DIAGNOSIS — Z7901 Long term (current) use of anticoagulants: Secondary | ICD-10-CM | POA: Diagnosis not present

## 2020-03-24 ENCOUNTER — Other Ambulatory Visit: Payer: Self-pay | Admitting: Nurse Practitioner

## 2020-04-02 ENCOUNTER — Telehealth: Payer: Self-pay | Admitting: *Deleted

## 2020-04-02 ENCOUNTER — Other Ambulatory Visit: Payer: Self-pay

## 2020-04-02 ENCOUNTER — Other Ambulatory Visit: Payer: Medicare PPO | Admitting: *Deleted

## 2020-04-02 DIAGNOSIS — I4819 Other persistent atrial fibrillation: Secondary | ICD-10-CM | POA: Diagnosis not present

## 2020-04-02 DIAGNOSIS — Z79899 Other long term (current) drug therapy: Secondary | ICD-10-CM | POA: Diagnosis not present

## 2020-04-02 DIAGNOSIS — I10 Essential (primary) hypertension: Secondary | ICD-10-CM | POA: Diagnosis not present

## 2020-04-02 LAB — BASIC METABOLIC PANEL
BUN/Creatinine Ratio: 10 (ref 10–24)
BUN: 20 mg/dL (ref 8–27)
CO2: 28 mmol/L (ref 20–29)
Calcium: 9.2 mg/dL (ref 8.6–10.2)
Chloride: 94 mmol/L — ABNORMAL LOW (ref 96–106)
Creatinine, Ser: 2.03 mg/dL — ABNORMAL HIGH (ref 0.76–1.27)
GFR calc Af Amer: 36 mL/min/{1.73_m2} — ABNORMAL LOW (ref >59)
GFR calc non Af Amer: 31 mL/min/{1.73_m2} — ABNORMAL LOW (ref >59)
Glucose: 470 mg/dL — ABNORMAL HIGH (ref 65–99)
Potassium: 4.6 mmol/L (ref 3.5–5.2)
Sodium: 134 mmol/L (ref 134–144)

## 2020-04-02 LAB — TSH: TSH: 4.46 u[IU]/mL (ref 0.450–4.500)

## 2020-04-02 MED ORDER — LOSARTAN POTASSIUM 100 MG PO TABS: 100.0000 mg | ORAL_TABLET | Freq: Every day | ORAL | 3 refills | Status: DC

## 2020-04-02 NOTE — Telephone Encounter (Signed)
Pt called back in returning pam's called , lab appt sch Dec 15th and [facility] [place]  [id number] [place], [address]  [place], [place]  [address]  Phone:  [phone] Fax:  [phone]

## 2020-04-02 NOTE — Telephone Encounter (Signed)
Mark Pain, MD  Stavely, Mark Ka "David" 6 hours ago (8:56 AM)   Blood pressures are still elevated. Lets increase the losartan to 100 mg a day.  Pam will send out a new prescription. We will have you come back to clinic in 2 weeks for basic metabolic profile, we need to watch your kidney function closely. Thank you.  [name], MD   Antonieta Iba, RN routed conversation to You; Mark Pain, MD 7 hours ago (7:39 AM)  [name] "[name], MD 8 hours ago (6:33 AM)  WC 11-13 AM 160/72 HR58  PM 160/61 HR61 11-14 AM 164/74 HR 59 11-15 AM 153/69 HR 56 PM 162/65 HR 58 11-16 AM 159/69 HR 57 11-17 AM 153/65 HR 63 11-18 AM 154/66 HR 65 11-19 AM 146/66 HR 59 11-20 AM 162/70 HR 60 11-21 AM 160/69 HR 60 11-22 AM 154/65 HR 65 11-23 AM 156/64 HR 61 11-24 AM 158/70 HR 59 11-25 152/68 HR 61 11-26 AM 153/64 HR 58 PM 158/60 HR 64 11-28 AM 155/72 HR 59 PM 143/68 HR 61 11-29 AM 152/72 HR 57 PM 137/57 HR 61  11-30 AM 146/68 HR 64 PM 158/59 HR 64 12-2 AM 152/59 HR 59   Left message for pt to c/b to verify pharmacy and to schedule lab work for 2 weeks.

## 2020-04-02 NOTE — Telephone Encounter (Signed)
Rx sent electronically.  

## 2020-04-03 DIAGNOSIS — E1165 Type 2 diabetes mellitus with hyperglycemia: Secondary | ICD-10-CM | POA: Diagnosis not present

## 2020-04-09 ENCOUNTER — Other Ambulatory Visit (HOSPITAL_COMMUNITY): Payer: Self-pay | Admitting: Cardiology

## 2020-04-13 DIAGNOSIS — G4733 Obstructive sleep apnea (adult) (pediatric): Secondary | ICD-10-CM | POA: Diagnosis not present

## 2020-04-15 ENCOUNTER — Other Ambulatory Visit: Payer: Medicare PPO | Admitting: *Deleted

## 2020-04-15 ENCOUNTER — Other Ambulatory Visit: Payer: Self-pay | Admitting: *Deleted

## 2020-04-15 ENCOUNTER — Other Ambulatory Visit: Payer: Self-pay

## 2020-04-15 DIAGNOSIS — I10 Essential (primary) hypertension: Secondary | ICD-10-CM

## 2020-04-15 DIAGNOSIS — Z79899 Other long term (current) drug therapy: Secondary | ICD-10-CM

## 2020-04-15 LAB — BASIC METABOLIC PANEL
BUN/Creatinine Ratio: 10 (ref 10–24)
BUN: 23 mg/dL (ref 8–27)
CO2: 27 mmol/L (ref 20–29)
Calcium: 9.2 mg/dL (ref 8.6–10.2)
Chloride: 91 mmol/L — ABNORMAL LOW (ref 96–106)
Creatinine, Ser: 2.36 mg/dL — ABNORMAL HIGH (ref 0.76–1.27)
GFR calc Af Amer: 30 mL/min/{1.73_m2} — ABNORMAL LOW (ref >59)
GFR calc non Af Amer: 26 mL/min/{1.73_m2} — ABNORMAL LOW (ref >59)
Glucose: 398 mg/dL — ABNORMAL HIGH (ref 65–99)
Potassium: 4.3 mmol/L (ref 3.5–5.2)
Sodium: 130 mmol/L — ABNORMAL LOW (ref 134–144)

## 2020-05-11 DIAGNOSIS — I10 Essential (primary) hypertension: Secondary | ICD-10-CM

## 2020-05-11 DIAGNOSIS — Z79899 Other long term (current) drug therapy: Secondary | ICD-10-CM

## 2020-05-13 DIAGNOSIS — G4733 Obstructive sleep apnea (adult) (pediatric): Secondary | ICD-10-CM | POA: Diagnosis not present

## 2020-05-13 MED ORDER — SPIRONOLACTONE 25 MG PO TABS: 12.5000 mg | ORAL_TABLET | Freq: Every day | ORAL | 3 refills | Status: DC

## 2020-05-13 NOTE — Telephone Encounter (Signed)
I think we still have some room for improvement.  Lets start spironolactone 12.5 mg once a day  Have him come back in 1 week for basic metabolic profile.   [name], MD   Message text

## 2020-05-14 DIAGNOSIS — G4733 Obstructive sleep apnea (adult) (pediatric): Secondary | ICD-10-CM | POA: Diagnosis not present

## 2020-05-15 DIAGNOSIS — Z01812 Encounter for preprocedural laboratory examination: Secondary | ICD-10-CM | POA: Diagnosis not present

## 2020-05-19 ENCOUNTER — Telehealth: Payer: Self-pay | Admitting: Cardiology

## 2020-05-19 NOTE — Telephone Encounter (Signed)
Patient with diagnosis of afib on Xarelto for anticoagulation.    Procedure: Endoscopy and colonoscopy  Date of procedure: 05/21/19    CHA2DS2-VASc Score = 4  This indicates a 4.8% annual risk of stroke. The patient's score is based upon: CHF History: No HTN History: Yes Diabetes History: Yes Stroke History: No Vascular Disease History: No Age Score: 2 Gender Score: 0     CrCl 33 ml/min  Per office protocol, patient can hold Xarelto for 2 days prior to procedure.

## 2020-05-19 NOTE — Telephone Encounter (Signed)
   Primary Cardiologist: [name], MD  Chart reviewed as part of pre-operative protocol coverage. Given past medical history and time since last visit, based on ACC/AHA guidelines, Mark Singh would be at acceptable risk for the planned procedure without further cardiovascular testing.   The patient was advised that if he develops new symptoms prior to surgery to contact our office to arrange for a follow-up visit, and he verbalized understanding.  CHADS2VASc score 4, CrCl 33 ml/min. Per office protocol and pharmacy review, he may hold Xarelto 2 days prior to procedure. His last dose of Xarelto was 05/17/20.   I will route this recommendation to the requesting party via Epic fax function and remove from pre-op pool.  Please call with questions.  Loel Dubonnet, NP 05/19/2020, 4:24 PM

## 2020-05-19 NOTE — Telephone Encounter (Signed)
    Primary Cardiologist: [name], MD  Chart reviewed as part of pre-operative protocol coverage. Patient was contacted 05/19/2020 in reference to pre-operative risk assessment for pending surgery as outlined below.  Mark Singh was last seen on 03/13/20 by Dr. Marlou Porch.  Since that day, Mark Singh has done well.  Therefore, based on ACC/AHA guidelines, the patient would be at acceptable risk for the planned procedure without further cardiovascular testing.   Will route to pharmacy team for recommendations prior to routing to requesting office.  Patient reports last dose of Xarelto 05/17/20 at dinnertime  CrCr 26mL/min by original equation, 55mL/min when adjusted for overweight. (based on creatinine from 04/15/20)  12/30/19 Platelet count 288  The patient was advised that if he develops new symptoms prior to surgery to contact our office to arrange for a follow-up visit, and he verbalized understanding.    Mark Dubonnet, NP 05/19/2020, 3:03 PM

## 2020-05-19 NOTE — Telephone Encounter (Signed)
   [facility] Pre-operative Risk Assessment    HEARTCARE STAFF: - Please ensure there is not already an duplicate clearance open for this procedure. - Under Visit Info/Reason for Call, type in Other and utilize the format Clearance MM/DD/YY or Clearance TBD. Do not use dashes or single digits. - If request is for dental extraction, please clarify the # of teeth to be extracted.  Request for surgical clearance:  1. What type of surgery is being performed? Endoscopy and colonoscopy   2. When is this surgery scheduled? 05/21/2019 3. What type of clearance is required (medical clearance vs. Pharmacy clearance to hold med vs. Both)? Both   4. Are there any medications that need to be held prior to surgery and how long?  XARELTO 20 MG TABS tablet   5. Practice name and name of physician performing surgery? [facility]    6. What is the office phone number? [phone]   7.   What is the office fax number? [phone]  8.   Anesthesia type (None, local, MAC, general) ?  MAC  URGENT.  They are needing this buy tomorrow.  Pt has already prepped    Shana A Stovall 05/19/2020, 2:43 PM  _________________________________________________________________   (provider comments below)

## 2020-05-20 DIAGNOSIS — D124 Benign neoplasm of descending colon: Secondary | ICD-10-CM | POA: Diagnosis not present

## 2020-05-20 DIAGNOSIS — K635 Polyp of colon: Secondary | ICD-10-CM | POA: Diagnosis not present

## 2020-05-20 DIAGNOSIS — R195 Other fecal abnormalities: Secondary | ICD-10-CM | POA: Diagnosis not present

## 2020-05-20 DIAGNOSIS — R12 Heartburn: Secondary | ICD-10-CM | POA: Diagnosis not present

## 2020-05-20 DIAGNOSIS — D123 Benign neoplasm of transverse colon: Secondary | ICD-10-CM | POA: Diagnosis not present

## 2020-05-20 DIAGNOSIS — D120 Benign neoplasm of cecum: Secondary | ICD-10-CM | POA: Diagnosis not present

## 2020-05-20 DIAGNOSIS — D122 Benign neoplasm of ascending colon: Secondary | ICD-10-CM | POA: Diagnosis not present

## 2020-05-20 DIAGNOSIS — K319 Disease of stomach and duodenum, unspecified: Secondary | ICD-10-CM | POA: Diagnosis not present

## 2020-05-20 DIAGNOSIS — K3189 Other diseases of stomach and duodenum: Secondary | ICD-10-CM | POA: Diagnosis not present

## 2020-05-21 ENCOUNTER — Other Ambulatory Visit: Payer: Self-pay

## 2020-05-21 ENCOUNTER — Other Ambulatory Visit: Payer: Medicare PPO | Admitting: *Deleted

## 2020-05-21 DIAGNOSIS — I10 Essential (primary) hypertension: Secondary | ICD-10-CM

## 2020-05-21 DIAGNOSIS — Z79899 Other long term (current) drug therapy: Secondary | ICD-10-CM | POA: Diagnosis not present

## 2020-05-21 LAB — BASIC METABOLIC PANEL
BUN/Creatinine Ratio: 10 (ref 10–24)
BUN: 21 mg/dL (ref 8–27)
CO2: 26 mmol/L (ref 20–29)
Calcium: 9.6 mg/dL (ref 8.6–10.2)
Chloride: 100 mmol/L (ref 96–106)
Creatinine, Ser: 2.17 mg/dL — ABNORMAL HIGH (ref 0.76–1.27)
GFR calc Af Amer: 33 mL/min/{1.73_m2} — ABNORMAL LOW (ref >59)
GFR calc non Af Amer: 29 mL/min/{1.73_m2} — ABNORMAL LOW (ref >59)
Glucose: 91 mg/dL (ref 65–99)
Potassium: 4.7 mmol/L (ref 3.5–5.2)
Sodium: 141 mmol/L (ref 134–144)

## 2020-05-25 DIAGNOSIS — I480 Paroxysmal atrial fibrillation: Secondary | ICD-10-CM | POA: Diagnosis not present

## 2020-05-25 DIAGNOSIS — I129 Hypertensive chronic kidney disease with stage 1 through stage 4 chronic kidney disease, or unspecified chronic kidney disease: Secondary | ICD-10-CM | POA: Diagnosis not present

## 2020-05-25 DIAGNOSIS — J45909 Unspecified asthma, uncomplicated: Secondary | ICD-10-CM | POA: Diagnosis not present

## 2020-05-25 DIAGNOSIS — E1122 Type 2 diabetes mellitus with diabetic chronic kidney disease: Secondary | ICD-10-CM | POA: Diagnosis not present

## 2020-05-25 DIAGNOSIS — I10 Essential (primary) hypertension: Secondary | ICD-10-CM | POA: Diagnosis not present

## 2020-05-25 DIAGNOSIS — E119 Type 2 diabetes mellitus without complications: Secondary | ICD-10-CM | POA: Diagnosis not present

## 2020-05-25 DIAGNOSIS — K219 Gastro-esophageal reflux disease without esophagitis: Secondary | ICD-10-CM | POA: Diagnosis not present

## 2020-05-25 DIAGNOSIS — E785 Hyperlipidemia, unspecified: Secondary | ICD-10-CM | POA: Diagnosis not present

## 2020-05-25 DIAGNOSIS — N183 Chronic kidney disease, stage 3 unspecified: Secondary | ICD-10-CM | POA: Diagnosis not present

## 2020-05-27 DIAGNOSIS — K635 Polyp of colon: Secondary | ICD-10-CM | POA: Diagnosis not present

## 2020-05-27 DIAGNOSIS — D123 Benign neoplasm of transverse colon: Secondary | ICD-10-CM | POA: Diagnosis not present

## 2020-05-27 DIAGNOSIS — K319 Disease of stomach and duodenum, unspecified: Secondary | ICD-10-CM | POA: Diagnosis not present

## 2020-05-27 DIAGNOSIS — C609 Malignant neoplasm of penis, unspecified: Secondary | ICD-10-CM | POA: Diagnosis not present

## 2020-05-27 DIAGNOSIS — D122 Benign neoplasm of ascending colon: Secondary | ICD-10-CM | POA: Diagnosis not present

## 2020-05-27 DIAGNOSIS — N480 Leukoplakia of penis: Secondary | ICD-10-CM | POA: Diagnosis not present

## 2020-05-27 DIAGNOSIS — D120 Benign neoplasm of cecum: Secondary | ICD-10-CM | POA: Diagnosis not present

## 2020-05-27 DIAGNOSIS — D124 Benign neoplasm of descending colon: Secondary | ICD-10-CM | POA: Diagnosis not present

## 2020-06-01 ENCOUNTER — Other Ambulatory Visit: Payer: Self-pay | Admitting: Physician Assistant

## 2020-06-07 ENCOUNTER — Other Ambulatory Visit: Payer: Self-pay | Admitting: Cardiology

## 2020-06-08 NOTE — Telephone Encounter (Signed)
Pt's age 75, wt 113.9 kg, SCr 2.17, CrCl 26.52, last ov w/ MS 03/13/20.

## 2020-06-09 DIAGNOSIS — N184 Chronic kidney disease, stage 4 (severe): Secondary | ICD-10-CM | POA: Diagnosis not present

## 2020-06-09 DIAGNOSIS — I5030 Unspecified diastolic (congestive) heart failure: Secondary | ICD-10-CM | POA: Diagnosis not present

## 2020-06-09 DIAGNOSIS — N041 Nephrotic syndrome with focal and segmental glomerular lesions: Secondary | ICD-10-CM | POA: Diagnosis not present

## 2020-06-09 DIAGNOSIS — I272 Pulmonary hypertension, unspecified: Secondary | ICD-10-CM | POA: Diagnosis not present

## 2020-06-09 DIAGNOSIS — N179 Acute kidney failure, unspecified: Secondary | ICD-10-CM | POA: Diagnosis not present

## 2020-06-09 DIAGNOSIS — Z9989 Dependence on other enabling machines and devices: Secondary | ICD-10-CM | POA: Diagnosis not present

## 2020-06-09 DIAGNOSIS — E1122 Type 2 diabetes mellitus with diabetic chronic kidney disease: Secondary | ICD-10-CM | POA: Diagnosis not present

## 2020-06-09 DIAGNOSIS — G4733 Obstructive sleep apnea (adult) (pediatric): Secondary | ICD-10-CM | POA: Diagnosis not present

## 2020-06-09 DIAGNOSIS — I129 Hypertensive chronic kidney disease with stage 1 through stage 4 chronic kidney disease, or unspecified chronic kidney disease: Secondary | ICD-10-CM | POA: Diagnosis not present

## 2020-06-29 ENCOUNTER — Other Ambulatory Visit: Payer: Self-pay | Admitting: Cardiology

## 2020-06-30 ENCOUNTER — Other Ambulatory Visit: Payer: Self-pay | Admitting: Cardiology

## 2020-07-03 DIAGNOSIS — E1122 Type 2 diabetes mellitus with diabetic chronic kidney disease: Secondary | ICD-10-CM | POA: Diagnosis not present

## 2020-07-03 DIAGNOSIS — E785 Hyperlipidemia, unspecified: Secondary | ICD-10-CM | POA: Diagnosis not present

## 2020-07-05 ENCOUNTER — Other Ambulatory Visit: Payer: Self-pay | Admitting: Cardiology

## 2020-07-14 ENCOUNTER — Other Ambulatory Visit: Payer: Medicare PPO | Admitting: *Deleted

## 2020-07-14 ENCOUNTER — Other Ambulatory Visit: Payer: Self-pay

## 2020-07-14 DIAGNOSIS — I4819 Other persistent atrial fibrillation: Secondary | ICD-10-CM | POA: Diagnosis not present

## 2020-07-14 DIAGNOSIS — Z79899 Other long term (current) drug therapy: Secondary | ICD-10-CM | POA: Diagnosis not present

## 2020-07-14 DIAGNOSIS — G4733 Obstructive sleep apnea (adult) (pediatric): Secondary | ICD-10-CM | POA: Diagnosis not present

## 2020-07-14 LAB — TSH: TSH: 7.06 u[IU]/mL — ABNORMAL HIGH (ref 0.450–4.500)

## 2020-07-15 ENCOUNTER — Other Ambulatory Visit: Payer: Self-pay

## 2020-07-15 DIAGNOSIS — Z79899 Other long term (current) drug therapy: Secondary | ICD-10-CM

## 2020-07-15 DIAGNOSIS — I4819 Other persistent atrial fibrillation: Secondary | ICD-10-CM

## 2020-07-15 NOTE — Progress Notes (Signed)
Placed order to add on Free T4 order for patient's lab work yesterday TSH.

## 2020-07-16 LAB — SPECIMEN STATUS REPORT

## 2020-07-16 LAB — T4, FREE: Free T4: 0.99 ng/dL (ref 0.82–1.77)

## 2020-07-20 ENCOUNTER — Ambulatory Visit: Payer: Medicare PPO | Admitting: Podiatry

## 2020-07-20 ENCOUNTER — Ambulatory Visit (INDEPENDENT_AMBULATORY_CARE_PROVIDER_SITE_OTHER): Payer: Medicare PPO

## 2020-07-20 ENCOUNTER — Other Ambulatory Visit: Payer: Self-pay | Admitting: *Deleted

## 2020-07-20 ENCOUNTER — Encounter: Payer: Self-pay | Admitting: Podiatry

## 2020-07-20 ENCOUNTER — Other Ambulatory Visit: Payer: Self-pay

## 2020-07-20 DIAGNOSIS — M778 Other enthesopathies, not elsewhere classified: Secondary | ICD-10-CM | POA: Diagnosis not present

## 2020-07-20 DIAGNOSIS — M79671 Pain in right foot: Secondary | ICD-10-CM

## 2020-07-20 DIAGNOSIS — M2041 Other hammer toe(s) (acquired), right foot: Secondary | ICD-10-CM

## 2020-07-20 DIAGNOSIS — M79672 Pain in left foot: Secondary | ICD-10-CM | POA: Diagnosis not present

## 2020-07-20 DIAGNOSIS — Z79899 Other long term (current) drug therapy: Secondary | ICD-10-CM

## 2020-07-20 DIAGNOSIS — M779 Enthesopathy, unspecified: Secondary | ICD-10-CM

## 2020-07-20 MED ORDER — TRIAMCINOLONE ACETONIDE 10 MG/ML IJ SUSP
10.0000 mg | Freq: Once | INTRAMUSCULAR | Status: AC
  Administered 2020-07-20: 10 mg

## 2020-07-20 MED ORDER — AMIODARONE HCL 100 MG PO TABS: 100.0000 mg | ORAL_TABLET | ORAL | 3 refills | Status: DC

## 2020-07-23 NOTE — Progress Notes (Signed)
Subjective:   Patient ID: Mark Singh, male   DOB: 75 y.o.   MRN: [id number]   HPI Patient presents stating she is developed a lot of pain in the second toe joint right that is been present for a while with the second toe slowly lifting and had surgery on the left 1 years ago also complaining of secondary problem of pain in the midfoot right not as severe but present   ROS      Objective:  Physical Exam  Neurovascular status intact with patient found to have no change health history and has inflammation pain of the second metatarsal phalangeal joint right with fluid buildup around the joint surface and moderate discomfort midfoot right with swelling of the extensor tendon in the midfoot.  The second toe has lifted and there is moderate rigid contracture     Assessment:  Inflammatory capsulitis second MPJ with probability for flexor plate stretch or dislocation along with extensor tendinitis with midfoot arthritis      Plan:  H&P and reviewed conditions.  At this point we will get a focus on the joint and I did do a proximal nerve block I aspirated the joint getting out a small amount of clear fluid injected with quarter cc dexamethasone Kenalog and applied padding.  Discussed midfoot we discussed topical medicines to use oral medicines ice therapy shoe gear modifications with the consideration for injection if symptoms persist.  Understands ultimately may require digital fusion along with shortening osteotomy  X-rays indicate that there is significant rigid elevation digit to right with moderate midfoot arthritis

## 2020-08-03 DIAGNOSIS — N184 Chronic kidney disease, stage 4 (severe): Secondary | ICD-10-CM | POA: Diagnosis not present

## 2020-08-05 DIAGNOSIS — I272 Pulmonary hypertension, unspecified: Secondary | ICD-10-CM | POA: Diagnosis not present

## 2020-08-05 DIAGNOSIS — G479 Sleep disorder, unspecified: Secondary | ICD-10-CM | POA: Diagnosis not present

## 2020-08-05 DIAGNOSIS — G4734 Idiopathic sleep related nonobstructive alveolar hypoventilation: Secondary | ICD-10-CM | POA: Diagnosis not present

## 2020-08-05 DIAGNOSIS — G4733 Obstructive sleep apnea (adult) (pediatric): Secondary | ICD-10-CM | POA: Diagnosis not present

## 2020-08-22 ENCOUNTER — Other Ambulatory Visit: Payer: Self-pay | Admitting: Physician Assistant

## 2020-08-25 DIAGNOSIS — R0902 Hypoxemia: Secondary | ICD-10-CM | POA: Diagnosis not present

## 2020-08-31 ENCOUNTER — Other Ambulatory Visit: Payer: Medicare PPO

## 2020-09-02 DIAGNOSIS — I129 Hypertensive chronic kidney disease with stage 1 through stage 4 chronic kidney disease, or unspecified chronic kidney disease: Secondary | ICD-10-CM | POA: Diagnosis not present

## 2020-09-02 DIAGNOSIS — E785 Hyperlipidemia, unspecified: Secondary | ICD-10-CM | POA: Diagnosis not present

## 2020-09-02 DIAGNOSIS — J45909 Unspecified asthma, uncomplicated: Secondary | ICD-10-CM | POA: Diagnosis not present

## 2020-09-02 DIAGNOSIS — I480 Paroxysmal atrial fibrillation: Secondary | ICD-10-CM | POA: Diagnosis not present

## 2020-09-02 DIAGNOSIS — I10 Essential (primary) hypertension: Secondary | ICD-10-CM | POA: Diagnosis not present

## 2020-09-02 DIAGNOSIS — E1122 Type 2 diabetes mellitus with diabetic chronic kidney disease: Secondary | ICD-10-CM | POA: Diagnosis not present

## 2020-09-02 DIAGNOSIS — I272 Pulmonary hypertension, unspecified: Secondary | ICD-10-CM | POA: Diagnosis not present

## 2020-09-02 DIAGNOSIS — K219 Gastro-esophageal reflux disease without esophagitis: Secondary | ICD-10-CM | POA: Diagnosis not present

## 2020-09-02 DIAGNOSIS — E119 Type 2 diabetes mellitus without complications: Secondary | ICD-10-CM | POA: Diagnosis not present

## 2020-09-04 ENCOUNTER — Other Ambulatory Visit (HOSPITAL_BASED_OUTPATIENT_CLINIC_OR_DEPARTMENT_OTHER): Payer: Self-pay

## 2020-09-04 DIAGNOSIS — G4734 Idiopathic sleep related nonobstructive alveolar hypoventilation: Secondary | ICD-10-CM

## 2020-09-04 DIAGNOSIS — G4733 Obstructive sleep apnea (adult) (pediatric): Secondary | ICD-10-CM

## 2020-09-08 DIAGNOSIS — M109 Gout, unspecified: Secondary | ICD-10-CM | POA: Diagnosis not present

## 2020-09-08 DIAGNOSIS — J45909 Unspecified asthma, uncomplicated: Secondary | ICD-10-CM | POA: Diagnosis not present

## 2020-09-08 DIAGNOSIS — D6869 Other thrombophilia: Secondary | ICD-10-CM | POA: Diagnosis not present

## 2020-09-08 DIAGNOSIS — Z6838 Body mass index (BMI) 38.0-38.9, adult: Secondary | ICD-10-CM | POA: Diagnosis not present

## 2020-09-08 DIAGNOSIS — K219 Gastro-esophageal reflux disease without esophagitis: Secondary | ICD-10-CM | POA: Diagnosis not present

## 2020-09-08 DIAGNOSIS — E1122 Type 2 diabetes mellitus with diabetic chronic kidney disease: Secondary | ICD-10-CM | POA: Diagnosis not present

## 2020-09-08 DIAGNOSIS — I129 Hypertensive chronic kidney disease with stage 1 through stage 4 chronic kidney disease, or unspecified chronic kidney disease: Secondary | ICD-10-CM | POA: Diagnosis not present

## 2020-09-08 DIAGNOSIS — Z794 Long term (current) use of insulin: Secondary | ICD-10-CM | POA: Diagnosis not present

## 2020-09-14 DIAGNOSIS — E119 Type 2 diabetes mellitus without complications: Secondary | ICD-10-CM | POA: Diagnosis not present

## 2020-09-14 DIAGNOSIS — H5213 Myopia, bilateral: Secondary | ICD-10-CM | POA: Diagnosis not present

## 2020-09-14 DIAGNOSIS — Z961 Presence of intraocular lens: Secondary | ICD-10-CM | POA: Diagnosis not present

## 2020-09-15 ENCOUNTER — Ambulatory Visit (INDEPENDENT_AMBULATORY_CARE_PROVIDER_SITE_OTHER): Payer: Medicare PPO | Admitting: Cardiology

## 2020-09-15 ENCOUNTER — Other Ambulatory Visit: Payer: Self-pay

## 2020-09-15 ENCOUNTER — Encounter: Payer: Self-pay | Admitting: Cardiology

## 2020-09-15 ENCOUNTER — Ambulatory Visit: Payer: Medicare PPO | Admitting: Cardiology

## 2020-09-15 ENCOUNTER — Other Ambulatory Visit: Payer: Medicare PPO

## 2020-09-15 VITALS — BP 110/40 | HR 66 | Ht 69.0 in | Wt 263.0 lb

## 2020-09-15 VITALS — BP 124/66 | HR 60 | Ht 69.0 in | Wt 263.0 lb

## 2020-09-15 DIAGNOSIS — I493 Ventricular premature depolarization: Secondary | ICD-10-CM | POA: Diagnosis not present

## 2020-09-15 DIAGNOSIS — I5032 Chronic diastolic (congestive) heart failure: Secondary | ICD-10-CM

## 2020-09-15 DIAGNOSIS — I4819 Other persistent atrial fibrillation: Secondary | ICD-10-CM | POA: Diagnosis not present

## 2020-09-15 DIAGNOSIS — Z79899 Other long term (current) drug therapy: Secondary | ICD-10-CM | POA: Diagnosis not present

## 2020-09-15 LAB — TSH: TSH: 5.4 u[IU]/mL — ABNORMAL HIGH (ref 0.450–4.500)

## 2020-09-15 NOTE — Patient Instructions (Signed)

## 2020-09-15 NOTE — Progress Notes (Signed)
Electrophysiology Office Note   Date:  09/15/2020   ID:  Mark Singh, DOB 1945/08/19, MRN [id number]  PCP:  Kathyrn Lass, MD  Cardiologist:  Marlou Porch Primary Electrophysiologist:  Ayansh Feutz Meredith Leeds, MD    No chief complaint on file.    History of Present Illness: Mark Singh is a 75 y.o. male who presents today for electrophysiology evaluation.     He has a history significant for PVCs, nephrotic syndrome, diabetes, hypertension, hyperlipidemia, obesity, mild pulmonary hypertension.  He is status post PVC ablation.  PVCs were inferior to the left coronary cusp.  Ablation was performed 03/30/2015.  He subsequently went into atrial fibrillation and status post ablation 12/13/2017.  He then presented in atrial flutter and is status post ablation 02/26/2018.  Today, denies symptoms of palpitations, chest pain, shortness of breath, orthopnea, PND, lower extremity edema, claudication, dizziness, presyncope, syncope, bleeding, or neurologic sequela. The patient is tolerating medications without difficulties.  His main complaint today is fatigue.  He did notes no further episodes of atrial fibrillation.  He is able to do all of his daily activities, though is somewhat limited by his fatigue.  He had started with CPAP 1 year ago.  He was evaluated by his physicians and noted to be desaturating at night.  He has a plan for repeat sleep study.  Past Medical History:  Diagnosis Date  . Arthritis    "hands, feet" (12/13/2017)  . Asthma   . Bradycardia    a. Repoted h/o HR in the 40s.  . Edema    a. 2D echo 11/2013: EF 41-96%, LV diastolic function parameters were normal, severely dilated LA, PASP 22mmHg..  . H/O: gout    right foot; on daily RX" (12/13/2017)  . HTN (hypertension)   . Hyperlipidemia   . Myalgia and myositis, unspecified   . Nephrotic syndrome    a. responder to steroid therapy by Dr. Lorrene Reid.   . Obesity   . Orthostatic hypotension   . Penile cancer ([place])     "did circ"  . Pulmonary hypertension ([place])    a. Mild pulm HTN felt 2/2 obesity.  [name] PVC's (premature ventricular contractions)    a. Holter 05/2013 showing 18k PVCs (18% of the time).  . Seasonal asthma    mild  . Shoulder pain   . Squamous carcinoma    "right hand; left shoulder" (12/13/2017)  . Type 2 diabetes mellitus ([place])    Past Surgical History:  Procedure Laterality Date  . A-FLUTTER ABLATION N/A 02/23/2018   Procedure: A-FLUTTER ABLATION;  Surgeon: Constance Haw, MD;  Location: [place] [facility];  Service: Cardiovascular;  Laterality: N/A;  . ATRIAL FIBRILLATION ABLATION N/A 12/13/2017   Procedure: ATRIAL FIBRILLATION ABLATION;  Surgeon: Constance Haw, MD;  Location: [place] [facility];  Service: Cardiovascular;  Laterality: N/A;  . BUNIONECTOMY WITH HAMMERTOE RECONSTRUCTION Left 1992  &  2008   REMOVAL HEAL SPUR/ BUNIONECTOMY AND HAMMERTOE REPAIR   . CARDIOVERSION N/A 07/22/2015   Procedure: CARDIOVERSION;  Surgeon: Dorothy Spark, MD;  Location: [place];  Service: Cardiovascular;  Laterality: N/A;  . CARDIOVERSION N/A 10/06/2015   Procedure: CARDIOVERSION;  Surgeon: Sueanne Margarita, MD;  Location: [facility] ENDOSCOPY;  Service: Cardiovascular;  Laterality: N/A;  . CARDIOVERSION N/A 12/05/2016   Procedure: CARDIOVERSION;  Surgeon: Pixie Casino, MD;  Location: [facility] ENDOSCOPY;  Service: Cardiovascular;  Laterality: N/A;  . CARDIOVERSION N/A 06/29/2018   Procedure: CARDIOVERSION;  Surgeon: Thayer Headings, MD;  Location: [place];  Service: Cardiovascular;  Laterality: N/A;  . CARDIOVERSION N/A 09/20/2018   Procedure: CARDIOVERSION;  Surgeon: [name], MD;  Location: [place];  Service: Cardiovascular;  Laterality: N/A;  . CIRCUMCISION N/A 11/27/2012   Procedure:  CIRCUMCISION  AND EXCISION OF GLANS PENIS;  Surgeon: Fredricka Bonine, MD;  Location: [facility];  Service: Urology;  Laterality: N/A;  . ELECTROPHYSIOLOGIC STUDY N/A  03/30/2015   Procedure: PVC Ablation;  Surgeon: Basim Bartnik Meredith Leeds, MD;  Location: [place] [facility];  Service: Cardiovascular;  Laterality: N/A;  . HERNIA REPAIR    . SQUAMOUS CELL CARCINOMA EXCISION     "right hand; left shoulder" (12/13/2017)  . UMBILICAL HERNIA REPAIR  11-04-2005     Current Outpatient Medications  Medication Sig Dispense Refill  . amiodarone (PACERONE) 100 MG tablet Take 1 tablet (100 mg total) by mouth every other day. 90 tablet 3  . amLODipine (NORVASC) 10 MG tablet TAKE 1 TABLET BY MOUTH EVERY DAY 90 tablet 1  . atorvastatin (LIPITOR) 40 MG tablet Take 40 mg by mouth daily.    . BD PEN NEEDLE NANO 2ND GEN 32G X 4 MM MISC     . betamethasone valerate ointment (VALISONE) 0.1 % APPLY TO AFFECTED AREA TWICE A DAY    . Cholecalciferol (VITAMIN D3) 5000 units CAPS Take 5,000 Units by mouth daily.     . cloNIDine (CATAPRES) 0.1 MG tablet Take 1 tablet (0.1 mg total) by mouth daily. (03-02-20)ONCE A DAY FOR 5 DAYS THEN EVERY OTHER FOR  5 DAYS THEN STOP  ONLY IF BLOOD PRESSURE IS BELOW 140/80 CONSISTENTLY) 60 tablet 11  . Cyanocobalamin (VITAMIN B12) 3000 MCG SUBL Take 3,000 mcg by mouth daily.    . hydrALAZINE (APRESOLINE) 50 MG tablet TAKE 1 TABLET (50 MG TOTAL) BY MOUTH IN THE MORNING AND AT BEDTIME. 180 tablet 2  . KLOR-CON M20 20 MEQ tablet TAKE 1 TABLET BY MOUTH EVERY DAY 90 tablet 3  . LANTUS SOLOSTAR 100 UNIT/ML Solostar Pen Inject 40 Units into the skin at bedtime.   0  . losartan (COZAAR) 100 MG tablet TAKE 1 TABLET BY MOUTH EVERY DAY 90 tablet 2  . omeprazole (PRILOSEC) 20 MG capsule Take 20 mg by mouth daily.    Glory Rosebush Delica Lancets 16X MISC USE AS DIRECTED EVERY DAY    . ONETOUCH VERIO test strip USE AS DIRECTED (TEST DAILY) 90 **E11.9    . pimecrolimus (ELIDEL) 1 % cream Apply 1 application topically 2 (two) times daily as needed (psoriasis).     . probenecid (BENEMID) 500 MG tablet Take 500 mg by mouth daily.     [name] spironolactone (ALDACTONE) 25 MG  tablet Take 0.5 tablets (12.5 mg total) by mouth daily. 45 tablet 3  . tamsulosin (FLOMAX) 0.4 MG CAPS capsule Take 0.4 mg by mouth every evening.   4  . torsemide (DEMADEX) 20 MG tablet TAKE 3 TABLETS BY MOUTH EVERY DAY 270 tablet 3  . WIXELA INHUB 100-50 MCG/DOSE AEPB Inhale 2 puffs into the lungs daily as needed.    Alveda Reasons 20 MG TABS tablet TAKE 1 TABLET (20 MG TOTAL) BY MOUTH DAILY WITH SUPPER. 90 tablet 1   No current facility-administered medications for this visit.    Allergies:   Indomethacin   Social History:  The patient  reports that he has never smoked. He has never used smokeless tobacco. He reports that he does not drink alcohol and does not use drugs.  Family History:  The patient's family history includes CVA in his father; Coronary artery disease in his mother.   ROS:  Please see the history of present illness.   Otherwise, review of systems is positive for none.   All other systems are reviewed and negative.   PHYSICAL EXAM: VS:  BP 124/66   Pulse 60   Ht 5\' 9"  (1.753 m)   Wt 263 lb (119.3 kg)   BMI 38.84 kg/m  , BMI Body mass index is 38.84 kg/m. GEN: Well nourished, well developed, in no acute distress  HEENT: normal  Neck: no JVD, ca 6 months for Mr. Eulas Post rotid bruits, or masses Cardiac: RRR; no murmurs, rubs, or gallops,no edema  Respiratory:  clear to auscultation bilaterally, normal work of breathing GI: soft, nontender, nondistended, + BS MS: no deformity or atrophy  Skin: warm and dry Neuro:  Strength and sensation are intact Psych: euthymic mood, full affect  EKG:  EKG is ordered today. Personal review of the ekg ordered shows sinus rhythm, rate 60  Recent Labs: 12/30/2019: ALT 17; Hemoglobin 13.5; Platelets 288 05/21/2020: BUN 21; Creatinine, Ser 2.17; Potassium 4.7; Sodium 141 07/14/2020: TSH 7.060    Lipid Panel  No results found for: CHOL, TRIG, HDL, CHOLHDL, VLDL, LDLCALC, LDLDIRECT   Wt Readings from Last 3 Encounters:  09/15/20  263 lb (119.3 kg)  03/13/20 251 lb (113.9 kg)  03/02/20 253 lb (114.8 kg)      Other studies Reviewed: Additional studies/ records that were reviewed today include: TTE 12/28/14  Review of the above records today demonstrates:  - Left ventricle: The cavity size was normal. There was mild concentric hypertrophy. Systolic function was normal. The estimated ejection fraction was in the range of 55% to 60%. Although no diagnostic regional wall motion abnormality was identified, this possibility cannot be completely excluded on the basis of this study. Left ventricular diastolic function parameters were normal. - Left atrium: The atrium was severely dilated. - Pulmonary arteries: Systolic pressure was mildly increased. PA peak pressure: 38 mm Hg (S).   ASSESSMENT AND PLAN:  1.  PVCs: Status post ablation 03/22/2015.  No obvious recurrence.    2.  Persistent atrial fibrillation: Currently on Xarelto and amiodarone.  CHA2DS2-VASc of 3.  High risk medication monitoring.  Status post ablation 12/23/2017.  He has fortunately remained in sinus rhythm.  3.  Typical atrial flutter: Status post ablation 02/26/2018.  No recurrence.  4.  Chronic diastolic heart failure: No obvious volume overload.  5.  CKD stage IV-V: Followed by nephrology.  Currently on torsemide.    6.  Hypertension: Currently well controlled  7.  Obstructive sleep apnea: Patient is compliant with his CPAP.  Despite this, he has overnight oxygen desaturations.  He is planned for repeat sleep study.  Current medicines are reviewed at length with the patient today.   The patient does not have concerns regarding his medicines.  The following changes were made today: None  Labs/ tests ordered today include:  Orders Placed This Encounter  Procedures  . EKG 12-Lead     Disposition:   FU with Eain Mullendore 6 months  Signed, Ayham Word Meredith Leeds, MD  09/15/2020 9:50 AM     [facility] [address] [place] [address] [phone] (office) [phone] (fax)

## 2020-09-15 NOTE — Patient Instructions (Signed)
Medication Instructions:  °Your physician recommends that you continue on your current medications as directed. Please refer to the Current Medication list given to you today. ° °*If you need a refill on your cardiac medications before your next appointment, please call your pharmacy* ° ° °Lab Work: °None ordered ° ° °Testing/Procedures: °None ordered ° ° °Follow-Up: °At [facility], you and your health needs are our priority.  As part of our continuing mission to provide you with exceptional heart care, we have created designated Provider Care Teams.  These Care Teams include your primary Cardiologist (physician) and Advanced Practice Providers (APPs -  Physician Assistants and Nurse Practitioners) who all work together to provide you with the care you need, when you need it. ° °Your next appointment:   °6 month(s) ° °The format for your next appointment:   °In Person ° °Provider:   °Will Camnitz, MD ° ° ° °Thank you for choosing [facility]!! ° ° °Eleny Cortez, RN °[phone] °  °

## 2020-09-15 NOTE — Progress Notes (Signed)
Cardiology Office Note:    Date:  09/15/2020   ID:  Mark Singh, DOB Sep 08, 1945, MRN [id number]  PCP:  Kathyrn Lass, MD   [facility] Providers Cardiologist:  [name], MD Electrophysiologist:  [name], MD     Referring MD: Kathyrn Lass, MD     History of Present Illness:    Mark Singh is a 75 y.o. male here for follow-up of PVCs.  Had a PVC ablation inferior to the left coronary cusp on 03/28/2015.  He subsequently had atrial fibrillation status post ablation on 12/13/2017 had atrial flutter and had an ablation on 02/26/2018 for that.  [name] is his EP.  Overall been doing quite well except for fatigue.  No further episodes of A. fib or atrial flutter.  He started CPAP a little over a year ago.  Struggling with this however.  Overall not having the chest pain.  No further palpitations.  Past Medical History:  Diagnosis Date  . Arthritis    "hands, feet" (12/13/2017)  . Asthma   . Bradycardia    a. Repoted h/o HR in the 40s.  . Edema    a. 2D echo 11/2013: EF 38-18%, LV diastolic function parameters were normal, severely dilated LA, PASP 20mmHg..  . H/O: gout    right foot; on daily RX" (12/13/2017)  . HTN (hypertension)   . Hyperlipidemia   . Myalgia and myositis, unspecified   . Nephrotic syndrome    a. responder to steroid therapy by Dr. Lorrene Reid.   . Obesity   . Orthostatic hypotension   . Penile cancer ([place])    "did circ"  . Pulmonary hypertension ([place])    a. Mild pulm HTN felt 2/2 obesity.  [name] PVC's (premature ventricular contractions)    a. Holter 05/2013 showing 18k PVCs (18% of the time).  . Seasonal asthma    mild  . Shoulder pain   . Squamous carcinoma    "right hand; left shoulder" (12/13/2017)  . Type 2 diabetes mellitus ([place])     Past Surgical History:  Procedure Laterality Date  . A-FLUTTER ABLATION N/A 02/23/2018   Procedure: A-FLUTTER ABLATION;  Surgeon: Constance Haw, MD;  Location: [place] [facility];  Service: Cardiovascular;  Laterality: N/A;  . ATRIAL FIBRILLATION ABLATION N/A 12/13/2017   Procedure: ATRIAL FIBRILLATION ABLATION;  Surgeon: Constance Haw, MD;  Location: [place] [facility];  Service: Cardiovascular;  Laterality: N/A;  . BUNIONECTOMY WITH HAMMERTOE RECONSTRUCTION Left 1992  &  2008   REMOVAL HEAL SPUR/ BUNIONECTOMY AND HAMMERTOE REPAIR   . CARDIOVERSION N/A 07/22/2015   Procedure: CARDIOVERSION;  Surgeon: Dorothy Spark, MD;  Location: [place];  Service: Cardiovascular;  Laterality: N/A;  . CARDIOVERSION N/A 10/06/2015   Procedure: CARDIOVERSION;  Surgeon: Sueanne Margarita, MD;  Location: [place];  Service: Cardiovascular;  Laterality: N/A;  . CARDIOVERSION N/A 12/05/2016   Procedure: CARDIOVERSION;  Surgeon: Pixie Casino, MD;  Location: [facility] ENDOSCOPY;  Service: Cardiovascular;  Laterality: N/A;  . CARDIOVERSION N/A 06/29/2018   Procedure: CARDIOVERSION;  Surgeon: Thayer Headings, MD;  Location: [facility] ENDOSCOPY;  Service: Cardiovascular;  Laterality: N/A;  . CARDIOVERSION N/A 09/20/2018   Procedure: CARDIOVERSION;  Surgeon: [name], MD;  Location: [place];  Service: Cardiovascular;  Laterality: N/A;  . CIRCUMCISION N/A 11/27/2012   Procedure:  CIRCUMCISION  AND EXCISION OF GLANS PENIS;  Surgeon: Fredricka Bonine, MD;  Location: [facility];  Service: Urology;  Laterality: N/A;  .  ELECTROPHYSIOLOGIC STUDY N/A 03/30/2015   Procedure: PVC Ablation;  Surgeon: [name], MD;  Location: [place] [facility];  Service: Cardiovascular;  Laterality: N/A;  . HERNIA REPAIR    . SQUAMOUS CELL CARCINOMA EXCISION     "right hand; left shoulder" (12/13/2017)  . UMBILICAL HERNIA REPAIR  11-04-2005    Current Medications: Current Meds  Medication Sig  . amiodarone (PACERONE) 100 MG tablet Take 1 tablet (100 mg total) by mouth every other day.  [name] amLODipine (NORVASC) 10 MG tablet TAKE 1 TABLET BY MOUTH EVERY DAY  .  atorvastatin (LIPITOR) 40 MG tablet Take 40 mg by mouth daily.  . BD PEN NEEDLE NANO 2ND GEN 32G X 4 MM MISC   . betamethasone valerate ointment (VALISONE) 0.1 % APPLY TO AFFECTED AREA TWICE A DAY  . Cholecalciferol (VITAMIN D3) 5000 units CAPS Take 5,000 Units by mouth daily.   . cloNIDine (CATAPRES) 0.1 MG tablet Take 1 tablet (0.1 mg total) by mouth daily. (03-02-20)ONCE A DAY FOR 5 DAYS THEN EVERY OTHER FOR  5 DAYS THEN STOP  ONLY IF BLOOD PRESSURE IS BELOW 140/80 CONSISTENTLY)  . Cyanocobalamin (VITAMIN B12) 3000 MCG SUBL Take 3,000 mcg by mouth daily.  . hydrALAZINE (APRESOLINE) 50 MG tablet TAKE 1 TABLET (50 MG TOTAL) BY MOUTH IN THE MORNING AND AT BEDTIME.  [name] KLOR-CON M20 20 MEQ tablet TAKE 1 TABLET BY MOUTH EVERY DAY  . LANTUS SOLOSTAR 100 UNIT/ML Solostar Pen Inject 40 Units into the skin at bedtime.   [name] losartan (COZAAR) 100 MG tablet TAKE 1 TABLET BY MOUTH EVERY DAY  . omeprazole (PRILOSEC) 20 MG capsule Take 20 mg by mouth daily.  Glory Rosebush Delica Lancets 08M MISC USE AS DIRECTED EVERY DAY  . ONETOUCH VERIO test strip USE AS DIRECTED (TEST DAILY) 90 **E11.9  . pimecrolimus (ELIDEL) 1 % cream Apply 1 application topically 2 (two) times daily as needed (psoriasis).   . probenecid (BENEMID) 500 MG tablet Take 500 mg by mouth daily.   [name] spironolactone (ALDACTONE) 25 MG tablet Take 0.5 tablets (12.5 mg total) by mouth daily.  . tamsulosin (FLOMAX) 0.4 MG CAPS capsule Take 0.4 mg by mouth every evening.   . torsemide (DEMADEX) 20 MG tablet TAKE 3 TABLETS BY MOUTH EVERY DAY  . WIXELA INHUB 100-50 MCG/DOSE AEPB Inhale 2 puffs into the lungs daily as needed.  Alveda Reasons 20 MG TABS tablet TAKE 1 TABLET (20 MG TOTAL) BY MOUTH DAILY WITH SUPPER.     Allergies:   Indomethacin   Social History   Socioeconomic History  . Marital status: Widowed    Spouse name: Not on file  . Number of children: Not on file  . Years of education: Not on file  . Highest education level: Not on file   Occupational History  . Not on file  Tobacco Use  . Smoking status: Never Smoker  . Smokeless tobacco: Never Used  Vaping Use  . Vaping Use: Never used  Substance and Sexual Activity  . Alcohol use: Never  . Drug use: Never  . Sexual activity: Not Currently  Other Topics Concern  . Not on file  Social History Narrative  . Not on file   Social Determinants of Health   Financial Resource Strain: Not on file  Food Insecurity: Not on file  Transportation Needs: Not on file  Physical Activity: Not on file  Stress: Not on file  Social Connections: Not on file     Family History:  The patient's family history includes CVA in his father; Coronary artery disease in his mother.  ROS:   Please see the history of present illness.     All other systems reviewed and are negative.  EKGs/Labs/Other Studies Reviewed:    The following studies were reviewed today: Echocardiogram 12/28/2014-EF 55 to 60%    Recent Labs: 12/30/2019: ALT 17; Hemoglobin 13.5; Platelets 288 05/21/2020: BUN 21; Creatinine, Ser 2.17; Potassium 4.7; Sodium 141 07/14/2020: TSH 7.060  Recent Lipid Panel No results found for: CHOL, TRIG, HDL, CHOLHDL, VLDL, LDLCALC, LDLDIRECT   Risk Assessment/Calculations:      Physical Exam:    VS:  BP (!) 110/40 (BP Location: Left Arm, Patient Position: Sitting, Cuff Size: Normal)   Pulse 66   Ht 5\' 9"  (1.753 m)   Wt 263 lb (119.3 kg)   SpO2 97%   BMI 38.84 kg/m     Wt Readings from Last 3 Encounters:  09/15/20 263 lb (119.3 kg)  09/15/20 263 lb (119.3 kg)  03/13/20 251 lb (113.9 kg)     GEN:  Well nourished, well developed in no acute distress HEENT: Normal NECK: No JVD; No carotid bruits LYMPHATICS: No lymphadenopathy CARDIAC: RRR, no murmurs, rubs, gallops RESPIRATORY:  Clear to auscultation without rales, wheezing or rhonchi  ABDOMEN: Soft, non-tender, non-distended MUSCULOSKELETAL:  No edema; No deformity  SKIN: Warm and dry NEUROLOGIC:  Alert and  oriented x 3 PSYCHIATRIC:  Normal affect   ASSESSMENT:    1. PVC's (premature ventricular contractions)   2. Persistent atrial fibrillation ([place])   3. Chronic diastolic congestive heart failure ([place])    PLAN:    In order of problems listed above:  PVCs - Post ablation 03/22/2015.  Doing well.  [name].  Persistent atrial fibrillation - Currently on both Xarelto as well as amiodarone.  CHA2DS2-VASc score is 3.  Continue to monitor high risk medication with close lab monitoring. - Had ablation for both atrial fibrillation as well as flutter.  Last one was in 02/26/2018.  Remaining in sinus rhythm.  Chronic diastolic heart failure - Doing well without any volume overload.  Continue with current medication management with losartan 100 mg a day, amiodarone 100 mg a day, low-dose.  On torsemide as well as low-dose spironolactone.  His potassium is being monitored vigilantly.  Chronic kidney disease stage IV - Followed by nephrology.  On torsemide.  He is on spironolactone as well.  He is having trouble cutting the small tablets.  If you would like to, he can take this pill every other day.  If his blood pressure does get too low, clonidine would be the first in my mind to discontinue.  Obstructive sleep apnea - Compliant with CPAP.  He has been having trouble with it however.  His oxygen saturations have decreased down into the 80s.  He needs to do another sleep study.  He is waking up with dry mouth.  Hopefully if he can get this adjusted, this will help him with his overall sense of fatigue.   Going to lab today for amiodarone lab work. 6 month f/u  Medication Adjustments/Labs and Tests Ordered: Current medicines are reviewed at length with the patient today.  Concerns regarding medicines are outlined above.  No orders of the defined types were placed in this encounter.  No orders of the defined types were placed in this encounter.   Patient Instructions  Medication  Instructions:  The current medical regimen is effective;  continue present plan and medications.  *  If you need a refill on your cardiac medications before your next appointment, please call your pharmacy*  Follow-Up: At [facility], you and your health needs are our priority.  As part of our continuing mission to provide you with exceptional heart care, we have created designated Provider Care Teams.  These Care Teams include your primary Cardiologist (physician) and Advanced Practice Providers (APPs -  Physician Assistants and Nurse Practitioners) who all work together to provide you with the care you need, when you need it.  We recommend signing up for the patient portal called "MyChart".  Sign up information is provided on this After Visit Summary.  MyChart is used to connect with patients for Virtual Visits (Telemedicine).  Patients are able to view lab/test results, encounter notes, upcoming appointments, etc.  Non-urgent messages can be sent to your provider as well.   To learn more about what you can do with MyChart, go to [email or web address].    Your next appointment:   6 month(s)  The format for your next appointment:   In Person  Provider:   [name], MD   Thank you for choosing [facility]!!        Signed, [name], MD  09/15/2020 11:14 AM    [place]

## 2020-09-21 DIAGNOSIS — N184 Chronic kidney disease, stage 4 (severe): Secondary | ICD-10-CM | POA: Diagnosis not present

## 2020-09-25 ENCOUNTER — Other Ambulatory Visit: Payer: Self-pay | Admitting: *Deleted

## 2020-09-25 DIAGNOSIS — Z79899 Other long term (current) drug therapy: Secondary | ICD-10-CM

## 2020-09-30 DIAGNOSIS — E1122 Type 2 diabetes mellitus with diabetic chronic kidney disease: Secondary | ICD-10-CM | POA: Diagnosis not present

## 2020-09-30 DIAGNOSIS — N184 Chronic kidney disease, stage 4 (severe): Secondary | ICD-10-CM | POA: Diagnosis not present

## 2020-09-30 DIAGNOSIS — I129 Hypertensive chronic kidney disease with stage 1 through stage 4 chronic kidney disease, or unspecified chronic kidney disease: Secondary | ICD-10-CM | POA: Diagnosis not present

## 2020-09-30 DIAGNOSIS — I4819 Other persistent atrial fibrillation: Secondary | ICD-10-CM | POA: Diagnosis not present

## 2020-09-30 DIAGNOSIS — N041 Nephrotic syndrome with focal and segmental glomerular lesions: Secondary | ICD-10-CM | POA: Diagnosis not present

## 2020-10-12 ENCOUNTER — Ambulatory Visit (HOSPITAL_BASED_OUTPATIENT_CLINIC_OR_DEPARTMENT_OTHER): Payer: Medicare PPO | Attending: Internal Medicine | Admitting: Internal Medicine

## 2020-10-12 ENCOUNTER — Other Ambulatory Visit: Payer: Self-pay

## 2020-10-12 DIAGNOSIS — G4733 Obstructive sleep apnea (adult) (pediatric): Secondary | ICD-10-CM | POA: Diagnosis not present

## 2020-10-12 DIAGNOSIS — R0902 Hypoxemia: Secondary | ICD-10-CM | POA: Insufficient documentation

## 2020-10-12 DIAGNOSIS — G4734 Idiopathic sleep related nonobstructive alveolar hypoventilation: Secondary | ICD-10-CM | POA: Diagnosis present

## 2020-10-17 DIAGNOSIS — G4733 Obstructive sleep apnea (adult) (pediatric): Secondary | ICD-10-CM | POA: Diagnosis not present

## 2020-10-22 DIAGNOSIS — M79641 Pain in right hand: Secondary | ICD-10-CM | POA: Diagnosis not present

## 2020-10-27 DIAGNOSIS — I10 Essential (primary) hypertension: Secondary | ICD-10-CM | POA: Diagnosis not present

## 2020-10-27 DIAGNOSIS — G4733 Obstructive sleep apnea (adult) (pediatric): Secondary | ICD-10-CM | POA: Diagnosis not present

## 2020-10-27 DIAGNOSIS — J45909 Unspecified asthma, uncomplicated: Secondary | ICD-10-CM | POA: Diagnosis not present

## 2020-10-27 DIAGNOSIS — G4734 Idiopathic sleep related nonobstructive alveolar hypoventilation: Secondary | ICD-10-CM | POA: Diagnosis not present

## 2020-10-28 NOTE — Procedures (Signed)
   NAME: Mark Singh DATE OF BIRTH:  1946/03/11 MEDICAL RECORD NUMBER [id number]  LOCATION: [facility]  PHYSICIAN: Marius Ditch  DATE OF STUDY: 10/12/2020  SLEEP STUDY TYPE: Positive Airway Pressure Titration               REFERRING PHYSICIAN: Marius Ditch, MD/Jennifer Percell Miller, PA-C  EPWORTH SLEEPINESS SCORE:  11 HEIGHT: 5\' 9"  (175.3 cm)  WEIGHT: 260 lb (117.9 kg)    Body mass index is 38.4 kg/m.  NECK SIZE: 17 in.  CLINICAL INFORMATION The patient was referred to the sleep center for evaluation of persisting hypoxemia on CPAP. Test is done to determine if oxygen is needed  MEDICATIONS Patient self administered medications include: N/A. Medications administered during study include No sleep medicine administered.[name]  SLEEP STUDY TECHNIQUE The patient underwent an attended overnight polysomnography titration to assess the effects of cpap therapy. The following variables were monitored: EEG(C4-A1, C3-A2, O1-A2, O2-A1), EOG, submental and leg EMG, ECG, oxyhemoglobin saturation by pulse oximetry, thoracic and abdominal respiratory effort belts, nasal/oral airflow by pressure sensor, body position sensor and snoring sensor. CPAP pressure was titrated to eliminate apneas, hypopneas and oxygen desaturation.  TECHNICAL COMMENTS Comments added by Technician: PATIENT WAS ORDERED AS A CPAP TITRATION Comments added by Scorer: N/A  SLEEP ARCHITECTURE The study was initiated at 9:48:29 PM and terminated at 4:10:30 AM. Total recorded time was 382 minutes. EEG confirmed total sleep time was 309.5 minutes yielding a sleep efficiency of 81.0%%. Sleep onset after lights out was 5.8 minutes with a REM latency of 178.0 minutes. The patient spent 10.7%% of the night in stage N1 sleep, 69.5%% in stage N2 sleep, 1.0%% in stage N3 and 18.9% in REM. The Arousal Index was 33.3/hour.  RESPIRATORY PARAMETERS The overall AHI was 2.9 per hour, and the RDI was 11.8 events/hour  with a central apnea index of 0.6 per hour. Although lower pressures afforded good airway control, the most best setting of CPAP was IPAP/EPAP 18/18 cm H2O. He also needed 2 LPM of oxygen at this level of CPAP to treat hypoxemia. At this setting, the sleep efficiency was 32% (and the patient was supine for 9% of the time); the AHI was 0 events per hour, the RDI was 0 events/hour (with 0.6 central events) and the arousal index was 15.4 per hour and the oxygen nadir was 93.0%.  LEG MOVEMENT DATA The total leg movements were 87 with a resulting leg movement index of 16.9/hr. Associated arousal with leg movement index was 3.9/hr.  CARDIAC DATA The underlying cardiac rhythm was most consistent with sinus rhythm. Mean heart rate during sleep was 55.3 bpm. Additional rhythm abnormalities include PVCs.  IMPRESSIONS - Obstructive Sleep Apnea (OSA) Optimal pressure attained. Oxygen required to resolve hypoxemia  DIAGNOSIS - Obstructive Sleep Apnea (G47.33) - Hypoxemia requiring oxygen  RECOMMENDATIONS - CPAP could be continued with APAP or changed to CPAP therapy on 18 cm H2O with a Medium size Resmed Full Face Mask AirFit F30i mask and heated humidification. In either event, oxygen at 2 LPM should be added.   Marius Ditch Sleep specialist, [place] Board of Internal Medicine   ELECTRONICALLY SIGNED ON:  10/28/2020, 6:28 AM [place] PH: [phone]   FX: [phone] [place]

## 2020-11-12 DIAGNOSIS — M19041 Primary osteoarthritis, right hand: Secondary | ICD-10-CM | POA: Diagnosis not present

## 2020-11-25 ENCOUNTER — Other Ambulatory Visit: Payer: Self-pay

## 2020-11-25 ENCOUNTER — Other Ambulatory Visit: Payer: Medicare PPO

## 2020-11-25 DIAGNOSIS — Z79899 Other long term (current) drug therapy: Secondary | ICD-10-CM | POA: Diagnosis not present

## 2020-11-25 DIAGNOSIS — C609 Malignant neoplasm of penis, unspecified: Secondary | ICD-10-CM | POA: Diagnosis not present

## 2020-11-25 LAB — TSH: TSH: 4.72 u[IU]/mL — ABNORMAL HIGH (ref 0.450–4.500)

## 2020-11-26 DIAGNOSIS — I10 Essential (primary) hypertension: Secondary | ICD-10-CM | POA: Diagnosis not present

## 2020-11-26 DIAGNOSIS — G4733 Obstructive sleep apnea (adult) (pediatric): Secondary | ICD-10-CM | POA: Diagnosis not present

## 2020-11-26 DIAGNOSIS — G4734 Idiopathic sleep related nonobstructive alveolar hypoventilation: Secondary | ICD-10-CM | POA: Diagnosis not present

## 2020-11-26 DIAGNOSIS — J45909 Unspecified asthma, uncomplicated: Secondary | ICD-10-CM | POA: Diagnosis not present

## 2020-11-27 DIAGNOSIS — G4733 Obstructive sleep apnea (adult) (pediatric): Secondary | ICD-10-CM | POA: Diagnosis not present

## 2020-11-27 DIAGNOSIS — J45909 Unspecified asthma, uncomplicated: Secondary | ICD-10-CM | POA: Diagnosis not present

## 2020-11-27 DIAGNOSIS — I10 Essential (primary) hypertension: Secondary | ICD-10-CM | POA: Diagnosis not present

## 2020-11-27 DIAGNOSIS — G4734 Idiopathic sleep related nonobstructive alveolar hypoventilation: Secondary | ICD-10-CM | POA: Diagnosis not present

## 2020-12-01 DIAGNOSIS — L814 Other melanin hyperpigmentation: Secondary | ICD-10-CM | POA: Diagnosis not present

## 2020-12-01 DIAGNOSIS — L570 Actinic keratosis: Secondary | ICD-10-CM | POA: Diagnosis not present

## 2020-12-01 DIAGNOSIS — Z85828 Personal history of other malignant neoplasm of skin: Secondary | ICD-10-CM | POA: Diagnosis not present

## 2020-12-01 DIAGNOSIS — D225 Melanocytic nevi of trunk: Secondary | ICD-10-CM | POA: Diagnosis not present

## 2020-12-01 DIAGNOSIS — L821 Other seborrheic keratosis: Secondary | ICD-10-CM | POA: Diagnosis not present

## 2020-12-01 DIAGNOSIS — L723 Sebaceous cyst: Secondary | ICD-10-CM | POA: Diagnosis not present

## 2020-12-01 DIAGNOSIS — L578 Other skin changes due to chronic exposure to nonionizing radiation: Secondary | ICD-10-CM | POA: Diagnosis not present

## 2020-12-02 ENCOUNTER — Other Ambulatory Visit: Payer: Self-pay | Admitting: Cardiology

## 2020-12-02 NOTE — Telephone Encounter (Signed)
Prescription refill request for Xarelto received.  Indication: Afib Last office visit: 09/15/20 Marlou Porch)  Weight: 117.9kg Age: 75 Scr: 2.27 (09/30/20) CrCl: 46.89 ml/min  CrCl less than 18ml/min.  Message sent to Dr Marlou Porch regarding dosage.

## 2020-12-07 ENCOUNTER — Other Ambulatory Visit: Payer: Self-pay

## 2020-12-07 NOTE — Telephone Encounter (Deleted)
Eliquis mg refill request received. Patient is 75 years old, weight- 117.9 kg, Crea- on via , Diagnosis-, and last seen by on . Dose is appropriate based on dosing criteria. Will send in refill to requested pharmacy.

## 2020-12-07 NOTE — Telephone Encounter (Signed)
Xarelto mg refill request received. Pt is 76 years old, weight- 117.9 kg, Crea- 2.5 on 07/08/20 via [facility] , last seen by Dr. Marlou Porch on 09/15/20, Diagnosis-afib, CrCl- 42.58 ; based on this, pt should be on reduced dosage of Xaretlo. Routing to Dr. Marlou Porch for approval.

## 2020-12-08 ENCOUNTER — Telehealth: Payer: Self-pay

## 2020-12-08 MED ORDER — RIVAROXABAN 15 MG PO TABS: 15.0000 mg | ORAL_TABLET | Freq: Every day | ORAL | 1 refills | Status: DC

## 2020-12-08 NOTE — Telephone Encounter (Signed)
-----   Message from Jerline Pain, MD sent at 12/07/2020 11:17 AM EDT ----- OK to switch xarelto to 15mg  po QD.  Thanks [name], MD  ----- Message ----- From: Leonidas Romberg, RN Sent: 12/02/2020   7:55 AM EDT To: Jerline Pain, MD  Good Morning,  Pt requested refill for Xarelto 20mg : however, the CrCl is less than 32ml/min and per dosing criteria , he should be on 15mg  dose. Please advise on dosing.   Indication: Afib Last office visit: 09/15/20 Marlou Porch)  Weight: 117.9kg Age: 75 Scr: 2.27 (09/30/20 via [facility])  & 2.17 on 05/21/20 CrCl: 46.89 ml/min  Thanks,  Lisette Abu, RN

## 2020-12-08 NOTE — Telephone Encounter (Signed)
Called and spoke with patient regarding Xarelto dose change. Pt verbalized understanding. Appropriate dose sent to requested pharmacy.

## 2020-12-09 DIAGNOSIS — E1142 Type 2 diabetes mellitus with diabetic polyneuropathy: Secondary | ICD-10-CM | POA: Diagnosis not present

## 2020-12-09 DIAGNOSIS — J45909 Unspecified asthma, uncomplicated: Secondary | ICD-10-CM | POA: Diagnosis not present

## 2020-12-09 DIAGNOSIS — I110 Hypertensive heart disease with heart failure: Secondary | ICD-10-CM | POA: Diagnosis not present

## 2020-12-09 DIAGNOSIS — E11620 Type 2 diabetes mellitus with diabetic dermatitis: Secondary | ICD-10-CM | POA: Diagnosis not present

## 2020-12-09 DIAGNOSIS — I509 Heart failure, unspecified: Secondary | ICD-10-CM | POA: Diagnosis not present

## 2020-12-09 DIAGNOSIS — Z604 Social exclusion and rejection: Secondary | ICD-10-CM | POA: Diagnosis not present

## 2020-12-09 DIAGNOSIS — Z794 Long term (current) use of insulin: Secondary | ICD-10-CM | POA: Diagnosis not present

## 2020-12-09 DIAGNOSIS — L409 Psoriasis, unspecified: Secondary | ICD-10-CM | POA: Diagnosis not present

## 2020-12-09 DIAGNOSIS — M109 Gout, unspecified: Secondary | ICD-10-CM | POA: Diagnosis not present

## 2020-12-09 DIAGNOSIS — K219 Gastro-esophageal reflux disease without esophagitis: Secondary | ICD-10-CM | POA: Diagnosis not present

## 2020-12-09 DIAGNOSIS — Z7901 Long term (current) use of anticoagulants: Secondary | ICD-10-CM | POA: Diagnosis not present

## 2020-12-09 DIAGNOSIS — E261 Secondary hyperaldosteronism: Secondary | ICD-10-CM | POA: Diagnosis not present

## 2020-12-09 DIAGNOSIS — N400 Enlarged prostate without lower urinary tract symptoms: Secondary | ICD-10-CM | POA: Diagnosis not present

## 2020-12-09 DIAGNOSIS — I4891 Unspecified atrial fibrillation: Secondary | ICD-10-CM | POA: Diagnosis not present

## 2020-12-09 DIAGNOSIS — Z6837 Body mass index (BMI) 37.0-37.9, adult: Secondary | ICD-10-CM | POA: Diagnosis not present

## 2020-12-09 DIAGNOSIS — D6869 Other thrombophilia: Secondary | ICD-10-CM | POA: Diagnosis not present

## 2020-12-09 DIAGNOSIS — N529 Male erectile dysfunction, unspecified: Secondary | ICD-10-CM | POA: Diagnosis not present

## 2020-12-10 DIAGNOSIS — M19041 Primary osteoarthritis, right hand: Secondary | ICD-10-CM | POA: Diagnosis not present

## 2020-12-22 DIAGNOSIS — M25551 Pain in right hip: Secondary | ICD-10-CM | POA: Diagnosis not present

## 2020-12-23 ENCOUNTER — Encounter (HOSPITAL_BASED_OUTPATIENT_CLINIC_OR_DEPARTMENT_OTHER): Payer: Self-pay | Admitting: Physical Therapy

## 2020-12-23 ENCOUNTER — Other Ambulatory Visit: Payer: Self-pay

## 2020-12-23 ENCOUNTER — Ambulatory Visit (HOSPITAL_BASED_OUTPATIENT_CLINIC_OR_DEPARTMENT_OTHER): Payer: Medicare PPO | Attending: Surgical | Admitting: Physical Therapy

## 2020-12-23 DIAGNOSIS — M79651 Pain in right thigh: Secondary | ICD-10-CM | POA: Insufficient documentation

## 2020-12-23 DIAGNOSIS — M6281 Muscle weakness (generalized): Secondary | ICD-10-CM | POA: Diagnosis not present

## 2020-12-23 NOTE — Therapy (Addendum)
[place], [place], [address] Phone: [phone]   Fax:  [phone]  Physical Therapy Evaluation  Patient Details  Name: Mark Singh MRN: [id number] Date of Birth: 12-12-1945 Referring Provider (PT): Grier Mitts PA-C   Encounter Date: 12/23/2020   PT End of Session - 12/23/20 2201     Visit Number 1    Number of Visits 12    Date for PT Re-Evaluation 02/03/21    Authorization Type [employer]    Progress Note Due on Visit --   01/20/2021   PT Start Time 1350    PT Stop Time 1450    PT Time Calculation (min) 60 min    Activity Tolerance Patient tolerated treatment well    Behavior During Therapy [facility] for tasks assessed/performed             Past Medical History:  Diagnosis Date   Arthritis    "hands, feet" (12/13/2017)   Asthma    Bradycardia    a. Repoted h/o HR in the 40s.   Edema    a. 2D echo 11/2013: EF 87-86%, LV diastolic function parameters were normal, severely dilated LA, PASP 47mmHg..   H/O: gout    right foot; on daily RX" (12/13/2017)   HTN (hypertension)    Hyperlipidemia    Myalgia and myositis, unspecified    Nephrotic syndrome    a. responder to steroid therapy by Dr. Lorrene Reid.    Obesity    Orthostatic hypotension    Penile cancer ([place])    "did circ"   Pulmonary hypertension ([place])    a. Mild pulm HTN felt 2/2 obesity.   PVC's (premature ventricular contractions)    a. Holter 05/2013 showing 18k PVCs (18% of the time).   Seasonal asthma    mild   Shoulder pain    Squamous carcinoma    "right hand; left shoulder" (12/13/2017)   Type 2 diabetes mellitus ([place])     Past Surgical History:  Procedure Laterality Date   A-FLUTTER ABLATION N/A 02/23/2018   Procedure: A-FLUTTER ABLATION;  Surgeon: Constance Haw, MD;  Location: [facility];  Service: Cardiovascular;  Laterality: N/A;   ATRIAL FIBRILLATION ABLATION N/A 12/13/2017   Procedure: ATRIAL  FIBRILLATION ABLATION;  Surgeon: Constance Haw, MD;  Location: [facility];  Service: Cardiovascular;  Laterality: N/A;   BUNIONECTOMY WITH HAMMERTOE RECONSTRUCTION Left 1992  &  2008   REMOVAL HEAL SPUR/ BUNIONECTOMY AND HAMMERTOE REPAIR    CARDIOVERSION N/A 07/22/2015   Procedure: CARDIOVERSION;  Surgeon: Dorothy Spark, MD;  Location: [facility];  Service: Cardiovascular;  Laterality: N/A;   CARDIOVERSION N/A 10/06/2015   Procedure: CARDIOVERSION;  Surgeon: Sueanne Margarita, MD;  Location: [place];  Service: Cardiovascular;  Laterality: N/A;   CARDIOVERSION N/A 12/05/2016   Procedure: CARDIOVERSION;  Surgeon: Pixie Casino, MD;  Location[facility];  Service: Cardiovascular;  Laterality: N/A;   CARDIOVERSION N/A 06/29/2018   Procedure: CARDIOVERSION;  Surgeon: Thayer Headings, MD;  Location: [facility];  Service: Cardiovascular;  Laterality: N/A;   CARDIOVERSION N/A 09/20/2018   Procedure: CARDIOVERSION;  Surgeon: [name], MD;  Location: [facility];  Service: Cardiovascular;  Laterality: N/A;   CIRCUMCISION N/A 11/27/2012   Procedure:  CIRCUMCISION  AND EXCISION OF GLANS PENIS;  Surgeon: Fredricka Bonine, MD;  Location: [facility];  Service: Urology;  Laterality: N/A;   ELECTROPHYSIOLOGIC STUDY N/A 03/30/2015   Procedure: PVC Ablation;  Surgeon: [name], MD;  Location: [facility];  Service: Cardiovascular;  Laterality: N/A;   HERNIA REPAIR     SQUAMOUS CELL CARCINOMA EXCISION     "right hand; left shoulder" (05/10/3233)   UMBILICAL HERNIA REPAIR  11-04-2005    There were no vitals filed for this visit.    Subjective Assessment - 12/23/20 1355     Subjective Pt states his R hip pain has been going on for approx 1 year.  Pt states his R LE feels like it wants to give way which has been happening intermittently for approx 1 year.  Pt reports he had significant pain in R hip flexor last Thursday without any  specific MOI.  He noticed the pain when he stood up and was unable to bear much weight thru it.  He had difficulty sleeping due to pain.  Pt had to use a walking stick to ambulate and still had pain.  "I never had that kind of pain before".  Pt saw MD yesterday and had x rays.  MD informed him x ray looked good and he did not have arthritis.  MD ordered PT.  Pt is doing better now.  He has increased pain with bending and twisting a certain way.  Pt continues to have the feeling of his leg wanting to give way.  Pt is limited with ambulation.  He has pain with car transfers on passenger side.  Pt unable to perform his normal workout activities.  Pt is limtied with yard work.    Pertinent History Cardiac hx of A-fib and PVCs.  3 cardiac ablations for atrial flutters and PVCs.  5 cardioversions with the last one being in 2020.  orthostatic hypotension.  Penile CA:  surgery and chemotherapy in 2014 with resultant surgeries on 2017 and 2020.  Pt states he has had no signs of CA for 2 years.  Pt goes every 6 months to check penile CA for the next 3 years.  Chronic renal disease.  DM.  OA. HTN.  Abdominal hernia repair.  L ankle surgery and bunionectomy with hammertoe reconstruction.    Limitations Walking;Standing;Other (comment)   yard work   Diagnostic tests x ray    Patient Stated Goals find out what he can do in the gym, walk normally without pain, increase walking without walking stick    Pain Score 0-No pain   10/10 worst pain   Pain Location --   proximal anterolateral thigh   Pain Orientation Right                [facility] PT Assessment - 12/23/20 0001       Assessment   Medical Diagnosis R hip flexor pain    Referring Provider (PT) Grier Mitts PA-C    Onset Date/Surgical Date 12/17/20    Hand Dominance Left    Next MD Visit 01/19/2021    Prior Therapy shoulder; none for hip      Precautions   Precautions --   Avoid modalities due to prior CA.  Pt has A-fib and has had multiple  cardioversions and ablations.     Restrictions   Weight Bearing Restrictions No      Balance Screen   Has the patient fallen in the past 6 months No      [place] residence    Additional Comments 1 story home.  1 step to enter home and also has a ramp  Prior Function   Level of Independence Independent   He has had pain for 1 year though able to perform his normal activities and functional mobility skills with much less pain.  Pt ambulated community distance without a walking stick.  pt was working out at [employer].   Vocation Retired    Leisure Pt works out at [employer] 3-4 times per week.  He performs upper and lower body resistance machines. He walks the track upstairs though has to take breaks.  Pt takes a strength and balance class.      Cognition   Overall Cognitive Status Within Functional Limits for tasks assessed      Observation/Other Assessments   Other Surveys  Lower Extremity Functional Scale    Lower Extremity Functional Scale  38/80      ROM / Strength   AROM / PROM / Strength AROM;PROM;Strength      AROM   AROM Assessment Site Hip    Right/Left Hip Right;Left    Right Hip Flexion 80    Left Hip Flexion 89      PROM   PROM Assessment Site Hip    Right/Left Hip Right;Left    Right Hip Flexion 89      Strength   Strength Assessment Site Hip;Knee   No pain with MMT   Right/Left Hip Left;Right    Right Hip Flexion 4/5    Right Hip Extension 4-/5    Right Hip External Rotation  4/5    Right Hip ABduction 4/5    Left Hip Flexion 5/5    Left Hip Extension 5/5    Left Hip External Rotation 5/5    Left Hip ABduction 5/5    Right/Left Knee Right;Left    Right Knee Flexion 5/5    Right Knee Extension 4/5    Left Knee Flexion 5/5    Left Knee Extension 5/5      Palpation   Palpation comment TTP:  R central proximal thigh and prox to mid rectus femoris.  None in anterior or lateral hip.      Special Tests   Other  special tests Scour's Test:  neg bilat.  Thomas Test:  negative bilat though pt could feel stretch on R.  Ely's Test:  positive bilat      Transfers   Five time sit to stand comments  22 seconds without pain.  Minimal pain while performing.  occasional light usage of UEs.      Ambulation/Gait   Gait Comments decreased gait speed, decreased step length bilat, decreased pelvic rotation, limited arm swing                        Objective measurements completed on examination: See above findings.       [place] Adult PT Treatment/Exercise - 12/23/20 0001       Exercises   Exercises Knee/Hip      Knee/Hip Exercises: Stretches   Other Knee/Hip Stretches supine modified thomas test hip flexor stretch 2 sets x 20-30 seconds and 1 set with increased knee flexion x20-30 seconds.   Pt reports no pain with stretch     Knee/Hip Exercises: Sidelying   Other Sidelying Knee/Hip Exercises S/L clams 2x10 reps                    PT Education - 12/23/20 1506     Education Details Educated pt in dx, relevant anatomy, and POC.  Answered Pt's questions.  Gave  pt a HEP handout and educated pt in correct form and appropriate frequency.  Access Code: [facility]  URL: [email or web address]  Date: 12/23/2020  Prepared by: Ronny Flurry    Exercises  Modified Marcello Moores Stretch - 2 x daily - 6-7 x weekly - 1 sets - 2 reps - 20-30 seconds hold  Clamshell - 1 x daily - 5 x weekly - 2 sets - 10 reps    Person(s) Educated Patient    Methods Explanation;Demonstration;Verbal cues;Handout;Tactile cues    Comprehension Verbalized understanding;Returned demonstration              PT Short Term Goals - 12/23/20 2227       PT SHORT TERM GOAL #1   Title Pt will be independent and compliant with HEP for improved pain, flexibility, strength, and function.    Time 2    Period Weeks    Status New    Target Date 01/06/21      PT SHORT TERM GOAL #2   Title Pt will demo reduced  tenderness to palpation in anterior thigh for reduced inflammation and soreness and improved mobility.    Time 3    Period Weeks    Status New    Target Date 01/13/21      PT SHORT TERM GOAL #3   Title Pt will report reduced pain with ambulation and reduced reliance on walking stick.    Time 3    Period Weeks    Status New    Target Date 01/13/21               PT Long Term Goals - 12/23/20 2232       PT LONG TERM GOAL #1   Title Pt will report he is ambulating his normal distance without his walking stick without significant pain or difficulty.    Time --   4-6   Period Weeks    Status New    Target Date 02/03/21      PT LONG TERM GOAL #2   Title Pt will demo improved R hip and R knee extension strength to 5/5 MMT for improved tolerance with and performance of functional mobility and yard work.    Time 6    Period Weeks    Status New    Target Date 02/03/21      PT LONG TERM GOAL #3   Title Pt will be able to perform his normal standing activities without significant R LE pain.    Time --   4-6   Period Weeks    Status New    Target Date 02/03/21      PT LONG TERM GOAL #4   Title pt will be able to return to working out in gym without adverse effects.    Time 6    Period Weeks    Status New    Target Date 02/03/21                    Plan - 12/23/20 1446     Clinical Impression Statement Pt is a 75 y/o male with a dx of L hip flexor pain presenting to the clinic with L thigh pain and muscle weakness in R LE.  Pt reports having R LE pain including his R LE wanting to give way for approx 1 year.  He had significant pain in R hip flexor last Thursday without any specific MOI.   Pt had to use a walking stick to ambulate and still  had pain.  Pt saw MD yesterday and had x rays which pt states was negative for arthritis.  Pt has cardiac hx including A-fib,PVC's, and atria flutter having multiple ablations and cardioversions over the years.  Pt is doing better  now and ambulated in facility without walking stick.  He has increased pain with bending and twisting a certain way.  Pt continues to have the feeling of his leg wanting to give way.  He is limited with ambulation and has pain with car transfers on passenger side.  Pt is unable to perform his normal workout activities and is limtied with yard work.  Pt has weakness in R hip and R knee extension.  It required increased time for pt to perform 5 time sit to stand test though he didn't  have increased pain.  Pt had negative Thompson Tests bilat though felt more of a stretch on R.  He had positive Ely's Test bilat.  Pt should benefit form skilled PT services.    Personal Factors and Comorbidities Comorbidity 3+    Comorbidities Cardiac hx of A-fib and PVCs:  3 cardiac ablations and 5 cardioversions with the last one being in 2020.  orthostatic hypotension.  Penile CA:  Pt has had surgeries and chemotherapy and states he has had no signs of CA for 2 years.  Chronic renal disease.  DM.  OA. HTN.  Other PSHx include Abdominal hernia repair and L ankle surgery and bunionectomy with hammertoe reconstruction.    Examination-Activity Limitations Locomotion Level;Stand    Examination-Participation Restrictions Tour manager   Working out   [employer]chant [employer] officer Stable/Uncomplicated    Clinical Decision Making Low    Rehab Potential Good    PT Frequency 2x / week    PT Duration --   4-6 weeks   PT Treatment/Interventions ADLs/Self Care Home Management;Gait training;Stair training;Functional mobility training;Therapeutic activities;Therapeutic exercise;Neuromuscular re-education;Manual techniques;Patient/family education;Passive range of motion    PT Next Visit Plan Avoid Modalties due to Hx of CA.  Pt continues to be followed for CA though he states he currently does not have CA.  Review and perform HEP.  Cont with ther ex including gentle ROM and stretching and light strengthening per pt tolerance.   Cont with neuro re-ed activities.    PT Home Exercise Plan Access Code: [facility]  URL: [email or web address]  Date: 12/23/2020  Prepared by: Ronny Flurry    Exercises  Modified Marcello Moores Stretch - 2 x daily - 6-7 x weekly - 1 sets - 2 reps - 20-30 seconds hold  Clamshell - 1 x daily - 5 x weekly - 2 sets - 10 reps    Consulted and Agree with Plan of Care Patient             Patient will benefit from skilled therapeutic intervention in order to improve the following deficits and impairments:  Difficulty walking, Decreased activity tolerance, Decreased mobility, Decreased strength, Decreased range of motion, Impaired flexibility, Pain, Hypomobility  Visit Diagnosis: Pain in right thigh  Muscle weakness (generalized)     Problem List Patient Active Problem List   Diagnosis Date Noted   Secondary hypercoagulable state ([place]) 06/11/2019   Typical atrial flutter ([place]) 05/09/2018   BMI 40.0-44.9, adult ([place]) 05/09/2018   Asthma 01/29/2018   Chronic renal disease 01/29/2018   Gout 01/29/2018   Penile cancer ([place]) 01/29/2018   Persistent atrial fibrillation 11/25/2015   Current use of long term anticoagulation 11/25/2015   H/O type 2 diabetes mellitus 11/25/2015   H/O:  HTN (hypertension) 11/25/2015   Balanitis 09/30/2015   Atrial fibrillation ([place]) 06/30/2015   PVC (premature ventricular contraction) 03/30/2015   Hyperlipidemia 06/25/2014   Abnormal EKG 12/25/2013   Obesity 12/25/2013   Essential hypertension, benign 12/25/2013   PVC's (premature ventricular contractions) 05/07/2013   Edema    Orthostatic hypotension    HTN (hypertension)     Selinda Michaels III PT, DPT 12/23/20 10:42 PM   [facility] [place], [place], [address] Phone: [phone]   Fax:  [phone]  Name: Mark Singh MRN: [id number] Date of Birth: 08-Apr-1946

## 2020-12-27 DIAGNOSIS — G4734 Idiopathic sleep related nonobstructive alveolar hypoventilation: Secondary | ICD-10-CM | POA: Diagnosis not present

## 2020-12-27 DIAGNOSIS — G4733 Obstructive sleep apnea (adult) (pediatric): Secondary | ICD-10-CM | POA: Diagnosis not present

## 2020-12-27 DIAGNOSIS — I10 Essential (primary) hypertension: Secondary | ICD-10-CM | POA: Diagnosis not present

## 2020-12-27 DIAGNOSIS — J45909 Unspecified asthma, uncomplicated: Secondary | ICD-10-CM | POA: Diagnosis not present

## 2020-12-29 ENCOUNTER — Encounter (HOSPITAL_BASED_OUTPATIENT_CLINIC_OR_DEPARTMENT_OTHER): Payer: Self-pay | Admitting: Physical Therapy

## 2020-12-29 ENCOUNTER — Other Ambulatory Visit: Payer: Self-pay

## 2020-12-29 ENCOUNTER — Ambulatory Visit (HOSPITAL_BASED_OUTPATIENT_CLINIC_OR_DEPARTMENT_OTHER): Payer: Medicare PPO | Admitting: Physical Therapy

## 2020-12-29 DIAGNOSIS — M79651 Pain in right thigh: Secondary | ICD-10-CM | POA: Diagnosis not present

## 2020-12-29 DIAGNOSIS — M6281 Muscle weakness (generalized): Secondary | ICD-10-CM

## 2020-12-29 NOTE — Therapy (Signed)
[place], [place], [address] Phone: [phone]   Fax:  [phone]  Physical Therapy Treatment  Patient Details  Name: Mark Singh MRN: [id number] Date of Birth: 23-Feb-1946 Referring Provider (PT): Grier Mitts PA-C   Encounter Date: 12/29/2020   PT End of Session - 12/29/20 1043     Visit Number 2    Number of Visits 12    Date for PT Re-Evaluation 02/03/21    Authorization Type [employer]    PT Start Time 1026    PT Stop Time 1100    PT Time Calculation (min) 34 min    Activity Tolerance Patient tolerated treatment well    Behavior During Therapy [facility] for tasks assessed/performed             Past Medical History:  Diagnosis Date   Arthritis    "hands, feet" (12/13/2017)   Asthma    Bradycardia    a. Repoted h/o HR in the 40s.   Edema    a. 2D echo 11/2013: EF 36-14%, LV diastolic function parameters were normal, severely dilated LA, PASP 55mmHg..   H/O: gout    right foot; on daily RX" (12/13/2017)   HTN (hypertension)    Hyperlipidemia    Myalgia and myositis, unspecified    Nephrotic syndrome    a. responder to steroid therapy by Dr. Lorrene Reid.    Obesity    Orthostatic hypotension    Penile cancer ([place])    "did circ"   Pulmonary hypertension ([place])    a. Mild pulm HTN felt 2/2 obesity.   PVC's (premature ventricular contractions)    a. Holter 05/2013 showing 18k PVCs (18% of the time).   Seasonal asthma    mild   Shoulder pain    Squamous carcinoma    "right hand; left shoulder" (12/13/2017)   Type 2 diabetes mellitus ([place])     Past Surgical History:  Procedure Laterality Date   A-FLUTTER ABLATION N/A 02/23/2018   Procedure: A-FLUTTER ABLATION;  Surgeon: Constance Haw, MD;  Location: [place] [facility];  Service: Cardiovascular;  Laterality: N/A;   ATRIAL FIBRILLATION ABLATION N/A 12/13/2017   Procedure: ATRIAL FIBRILLATION ABLATION;  Surgeon: Constance Haw, MD;  Location: [place] [facility];  Service: Cardiovascular;  Laterality: N/A;   BUNIONECTOMY WITH HAMMERTOE RECONSTRUCTION Left 1992  &  2008   REMOVAL HEAL SPUR/ BUNIONECTOMY AND HAMMERTOE REPAIR    CARDIOVERSION N/A 07/22/2015   Procedure: CARDIOVERSION;  Surgeon: Dorothy Spark, MD;  Location: [place];  Service: Cardiovascular;  Laterality: N/A;   CARDIOVERSION N/A 10/06/2015   Procedure: CARDIOVERSION;  Surgeon: Sueanne Margarita, MD;  Location: [place];  Service: Cardiovascular;  Laterality: N/A;   CARDIOVERSION N/A 12/05/2016   Procedure: CARDIOVERSION;  Surgeon: Pixie Casino, MD;  Location: [facility] ENDOSCOPY;  Service: Cardiovascular;  Laterality: N/A;   CARDIOVERSION N/A 06/29/2018   Procedure: CARDIOVERSION;  Surgeon: Thayer Headings, MD;  Location: [place];  Service: Cardiovascular;  Laterality: N/A;   CARDIOVERSION N/A 09/20/2018   Procedure: CARDIOVERSION;  Surgeon: [name], MD;  Location: [place];  Service: Cardiovascular;  Laterality: N/A;   CIRCUMCISION N/A 11/27/2012   Procedure:  CIRCUMCISION  AND EXCISION OF GLANS PENIS;  Surgeon: Fredricka Bonine, MD;  Location: [facility];  Service: Urology;  Laterality: N/A;   ELECTROPHYSIOLOGIC STUDY N/A 03/30/2015   Procedure: PVC Ablation;  Surgeon: [name], MD;  Location: [place] [facility];  Service: Cardiovascular;  Laterality: N/A;   HERNIA REPAIR     SQUAMOUS CELL CARCINOMA EXCISION     "right hand; left shoulder" (12/24/2351)   UMBILICAL HERNIA REPAIR  11-04-2005    There were no vitals filed for this visit.   Subjective Assessment - 12/29/20 1033     Subjective Pt denies any adverse effects after prior Rx.  Pt reports he has soreness with HEP though no increased pain.  Pt states if he is sitting, he is fine.  No pain with sitting.  Pt is not using the walking stick.  He has soreness with ambulation and is limited with standing.    Pertinent History Cardiac hx  of A-fib and PVCs.  3 cardiac ablations for atrial flutters and PVCs.  5 cardioversions with the last one being in 2020.  orthostatic hypotension.  Penile CA:  surgery and chemotherapy in 2014 with resultant surgeries on 2017 and 2020.  Pt states he has had no signs of CA for 2 years.  Pt goes every 6 months to check penile CA for the next 3 years.  Chronic renal disease.  DM.  OA. HTN.  Abdominal hernia repair.  L ankle surgery and bunionectomy with hammertoe reconstruction.    Limitations Walking;Standing    Currently in Pain? No/denies                [facility] PT Assessment - 12/29/20 0001       Palpation   Palpation comment TTP:  R central proximal to mid quad/rectus femoris                           [facility] Adult PT Treatment/Exercise - 12/29/20 0001       Exercises   Exercises Knee/Hip   Reviewed response to prior Rx, HEP compliance, pain level, and current function.     Knee/Hip Exercises: Stretches   Other Knee/Hip Stretches supine modified thomas test hip flexor stretch 3 sets x 20-30 seconds   Pt reports no pain with stretch   Other Knee/Hip Stretches S/L manual hip flexor stretch on R 3x30 sec.  Prone manual quad stretch 3x30 sec bilat.      Knee/Hip Exercises: Supine   Other Supine Knee/Hip Exercises supine bridge 2x10 reps    Other Supine Knee/Hip Exercises quad set x10 reps with 5 sec hold      Knee/Hip Exercises: Sidelying   Other Sidelying Knee/Hip Exercises S/L clams 3x10 reps                    PT Education - 12/29/20 2106     Education Details Reviewed and performed HEP.  instructed pt in correct form.  Answered Pt's questions including to not perform LE gym exercises yet but upper body would be ok as long as he doesn't have pain.    Person(s) Educated Patient    Methods Explanation;Demonstration;Verbal cues    Comprehension Returned demonstration;Verbalized understanding              PT Short Term Goals - 12/23/20 2227        PT SHORT TERM GOAL #1   Title Pt will be independent and compliant with HEP for improved pain, flexibility, strength, and function.    Time 2    Period Weeks    Status New    Target Date 01/06/21      PT SHORT TERM GOAL #2   Title Pt will demo reduced tenderness to  palpation in anterior thigh for reduced inflammation and soreness and improved mobility.    Time 3    Period Weeks    Status New    Target Date 01/13/21      PT SHORT TERM GOAL #3   Title Pt will report reduced pain with ambulation and reduced reliance on walking stick.    Time 3    Period Weeks    Status New    Target Date 01/13/21               PT Long Term Goals - 12/23/20 2232       PT LONG TERM GOAL #1   Title Pt will report he is ambulating his normal distance without his walking stick without significant pain or difficulty.    Time --   4-6   Period Weeks    Status New    Target Date 02/03/21      PT LONG TERM GOAL #2   Title Pt will demo improved R hip and R knee extension strength to 5/5 MMT for improved tolerance with and performance of functional mobility and yard work.    Time 6    Period Weeks    Status New    Target Date 02/03/21      PT LONG TERM GOAL #3   Title Pt will be able to perform his normal standing activities without significant R LE pain.    Time --   4-6   Period Weeks    Status New    Target Date 02/03/21      PT LONG TERM GOAL #4   Title pt will be able to return to working out in gym without adverse effects.    Time 6    Period Weeks    Status New    Target Date 02/03/21                   Plan - 12/29/20 2102     Clinical Impression Statement Pt continues to improve overall.  He is not ambulating with the walking stick.  He continues to be tender at proximal thigh and central quad.  Pt is compliant with HEP.  Pt performed exercises well with cuing and instruction on correct form.  He responded well to Rx having no increased pain after Rx.  Pt should  benefit from skilled PT services to address goals, improve pain, and restore PLOF.    Personal Factors and Comorbidities Comorbidity 3+    Comorbidities Cardiac hx of A-fib and PVCs:  3 cardiac ablations and 5 cardioversions with the last one being in 2020.  orthostatic hypotension.  Penile CA:  Pt has had surgeries and chemotherapy and states he has had no signs of CA for 2 years.  Chronic renal disease.  DM.  OA. HTN.  Other PSHx include Abdominal hernia repair and L ankle surgery and bunionectomy with hammertoe reconstruction.    PT Treatment/Interventions ADLs/Self Care Home Management;Gait training;Stair training;Functional mobility training;Therapeutic activities;Therapeutic exercise;Neuromuscular re-education;Manual techniques;Patient/family education;Passive range of motion    PT Next Visit Plan Avoid Modalties due to Hx of CA.  Pt continues to be followed for CA though he states he currently does not have CA.  Review and perform HEP.  Cont with ther ex including gentle ROM and stretching and light strengthening per pt tolerance.  Cont with neuro re-ed activities.    PT Home Exercise Plan Access Code: [facility]  URL: [email or web address]  Date: 12/23/2020  Prepared by: Ronny Flurry  Exercises  Modified Thomas Stretch - 2 x daily - 6-7 x weekly - 1 sets - 2 reps - 20-30 seconds hold  Clamshell - 1 x daily - 5 x weekly - 2 sets - 10 reps    Consulted and Agree with Plan of Care Patient             Patient will benefit from skilled therapeutic intervention in order to improve the following deficits and impairments:  Difficulty walking, Decreased activity tolerance, Decreased mobility, Decreased strength, Decreased range of motion, Impaired flexibility, Pain, Hypomobility  Visit Diagnosis: Pain in right thigh  Muscle weakness (generalized)     Problem List Patient Active Problem List   Diagnosis Date Noted   Secondary hypercoagulable state ([place]) 06/11/2019   Typical  atrial flutter ([place]) 05/09/2018   BMI 40.0-44.9, adult ([place]) 05/09/2018   Asthma 01/29/2018   Chronic renal disease 01/29/2018   Gout 01/29/2018   Penile cancer ([place]) 01/29/2018   Persistent atrial fibrillation 11/25/2015   Current use of long term anticoagulation 11/25/2015   H/O type 2 diabetes mellitus 11/25/2015   H/O: HTN (hypertension) 11/25/2015   Balanitis 09/30/2015   Atrial fibrillation ([place]) 06/30/2015   PVC (premature ventricular contraction) 03/30/2015   Hyperlipidemia 06/25/2014   Abnormal EKG 12/25/2013   Obesity 12/25/2013   Essential hypertension, benign 12/25/2013   PVC's (premature ventricular contractions) 05/07/2013   Edema    Orthostatic hypotension    HTN (hypertension)     Selinda Michaels III PT, DPT 12/29/20 9:09 PM   [facility] [place], [place], [address] Phone: [phone]   Fax:  [phone]  Name: Mark Singh MRN: [id number] Date of Birth: 1945-12-03

## 2020-12-31 ENCOUNTER — Encounter (HOSPITAL_BASED_OUTPATIENT_CLINIC_OR_DEPARTMENT_OTHER): Payer: Self-pay | Admitting: Physical Therapy

## 2020-12-31 ENCOUNTER — Other Ambulatory Visit: Payer: Self-pay

## 2020-12-31 ENCOUNTER — Ambulatory Visit (HOSPITAL_BASED_OUTPATIENT_CLINIC_OR_DEPARTMENT_OTHER): Payer: Medicare PPO | Attending: Surgical | Admitting: Physical Therapy

## 2020-12-31 DIAGNOSIS — M79651 Pain in right thigh: Secondary | ICD-10-CM | POA: Diagnosis not present

## 2020-12-31 DIAGNOSIS — M6281 Muscle weakness (generalized): Secondary | ICD-10-CM | POA: Insufficient documentation

## 2020-12-31 NOTE — Therapy (Signed)
[place], [place], [address] Phone: [phone]   Fax:  [phone]  Physical Therapy Treatment  Patient Details  Name: Mark Singh MRN: [id number] Date of Birth: 1946-03-03 Referring Provider (PT): Grier Mitts PA-C   Encounter Date: 12/31/2020   PT End of Session - 12/31/20 1031     Visit Number 3    Number of Visits 12    Date for PT Re-Evaluation 02/03/21    Authorization Type [employer]    PT Start Time 1011    PT Stop Time 1059    PT Time Calculation (min) 48 min    Activity Tolerance Patient tolerated treatment well    Behavior During Therapy [facility] for tasks assessed/performed             Past Medical History:  Diagnosis Date   Arthritis    "hands, feet" (12/13/2017)   Asthma    Bradycardia    a. Repoted h/o HR in the 40s.   Edema    a. 2D echo 11/2013: EF 11-57%, LV diastolic function parameters were normal, severely dilated LA, PASP 89mmHg..   H/O: gout    right foot; on daily RX" (12/13/2017)   HTN (hypertension)    Hyperlipidemia    Myalgia and myositis, unspecified    Nephrotic syndrome    a. responder to steroid therapy by Dr. Lorrene Reid.    Obesity    Orthostatic hypotension    Penile cancer ([place])    "did circ"   Pulmonary hypertension ([place])    a. Mild pulm HTN felt 2/2 obesity.   PVC's (premature ventricular contractions)    a. Holter 05/2013 showing 18k PVCs (18% of the time).   Seasonal asthma    mild   Shoulder pain    Squamous carcinoma    "right hand; left shoulder" (12/13/2017)   Type 2 diabetes mellitus ([place])     Past Surgical History:  Procedure Laterality Date   A-FLUTTER ABLATION N/A 02/23/2018   Procedure: A-FLUTTER ABLATION;  Surgeon: Constance Haw, MD;  Location: [place] [facility];  Service: Cardiovascular;  Laterality: N/A;   ATRIAL FIBRILLATION ABLATION N/A 12/13/2017   Procedure: ATRIAL FIBRILLATION ABLATION;  Surgeon: Constance Haw, MD;  Location: [place] [facility];  Service: Cardiovascular;  Laterality: N/A;   BUNIONECTOMY WITH HAMMERTOE RECONSTRUCTION Left 1992  &  2008   REMOVAL HEAL SPUR/ BUNIONECTOMY AND HAMMERTOE REPAIR    CARDIOVERSION N/A 07/22/2015   Procedure: CARDIOVERSION;  Surgeon: Dorothy Spark, MD;  Location: [place];  Service: Cardiovascular;  Laterality: N/A;   CARDIOVERSION N/A 10/06/2015   Procedure: CARDIOVERSION;  Surgeon: Sueanne Margarita, MD;  Location: [place];  Service: Cardiovascular;  Laterality: N/A;   CARDIOVERSION N/A 12/05/2016   Procedure: CARDIOVERSION;  Surgeon: Pixie Casino, MD;  Location: [facility] ENDOSCOPY;  Service: Cardiovascular;  Laterality: N/A;   CARDIOVERSION N/A 06/29/2018   Procedure: CARDIOVERSION;  Surgeon: Thayer Headings, MD;  Location: [place];  Service: Cardiovascular;  Laterality: N/A;   CARDIOVERSION N/A 09/20/2018   Procedure: CARDIOVERSION;  Surgeon: [name], MD;  Location: [place];  Service: Cardiovascular;  Laterality: N/A;   CIRCUMCISION N/A 11/27/2012   Procedure:  CIRCUMCISION  AND EXCISION OF GLANS PENIS;  Surgeon: Fredricka Bonine, MD;  Location: [facility];  Service: Urology;  Laterality: N/A;   ELECTROPHYSIOLOGIC STUDY N/A 03/30/2015   Procedure: PVC Ablation;  Surgeon: [name], MD;  Location: [place] [facility];  Service: Cardiovascular;  Laterality: N/A;   HERNIA REPAIR     SQUAMOUS CELL CARCINOMA EXCISION     "right hand; left shoulder" (6/80/3212)   UMBILICAL HERNIA REPAIR  11-04-2005    There were no vitals filed for this visit.   Subjective Assessment - 12/31/20 1012     Subjective "It aches when I am walking".  Pt denies pain with sitting. Pt is not using the walking stick.  Pt denies any adverse effects after prior Rx.  He has soreness with ambulation and is limited with standing.  Pt applies more weight on L LE with standing.  Pt reports he has soreness with HEP though no  increased pain.  Pt reports 1-2/10 pain with ambulating from parking lot into the facility.    Pertinent History Cardiac hx of A-fib and PVCs.  3 cardiac ablations for atrial flutters and PVCs.  5 cardioversions with the last one being in 2020.  orthostatic hypotension.  Penile CA:  surgery and chemotherapy in 2014 with resultant surgeries on 2017 and 2020.  Pt states he has had no signs of CA for 2 years.  Pt goes every 6 months to check penile CA for the next 3 years.  Chronic renal disease.  DM.  OA. HTN.  Abdominal hernia repair.  L ankle surgery and bunionectomy with hammertoe reconstruction.    Currently in Pain? No/denies                               [facility] Adult PT Treatment/Exercise - 12/31/20 0001       Ambulation/Gait   Gait Comments decreased gait speed      Exercises   Exercises Knee/Hip   Reviewed response to prior Rx, HEP compliance, pain level, and current function.     Knee/Hip Exercises: Stretches   Other Knee/Hip Stretches supine modified thomas test hip flexor stretch 3 reps x 30 seconds   Pt reports no pain with stretch   Other Knee/Hip Stretches S/L manual hip flexor stretch on R 3x30 sec.  Prone manual quad stretch 3x30 sec bilat.  prone quad stretch with strap x 30 sec bilat.  Standing hip flexor/quad stretch on R with stool and UE assist on table.      Knee/Hip Exercises: Supine   Other Supine Knee/Hip Exercises supine bridge 3x10 reps, supine clams with YTB x 15 reps and with RTB x 10 reps    Other Supine Knee/Hip Exercises quad set 2x10 reps with 5 sec hold      Knee/Hip Exercises: Sidelying   Other Sidelying Knee/Hip Exercises S/L clams 3x10 reps                    PT Education - 12/31/20 1024     Education Details Reviewed and performed HEP. instructed pt in correct form. Answered Pt's questions including appropriate exercises/activities.  instructed pt to not perform activities/exercise that increases pain.  Updated HEP and  gave pt a HEP handout.  Educated pt in correct form and appropriate frequency.  Instructed pt he should not have increased pain with HEP.    Person(s) Educated Patient    Methods Explanation;Demonstration;Verbal cues;Handout    Comprehension Returned demonstration;Verbalized understanding              PT Short Term Goals - 12/23/20 2227       PT SHORT TERM GOAL #1   Title Pt will be independent and compliant with HEP  for improved pain, flexibility, strength, and function.    Time 2    Period Weeks    Status New    Target Date 01/06/21      PT SHORT TERM GOAL #2   Title Pt will demo reduced tenderness to palpation in anterior thigh for reduced inflammation and soreness and improved mobility.    Time 3    Period Weeks    Status New    Target Date 01/13/21      PT SHORT TERM GOAL #3   Title Pt will report reduced pain with ambulation and reduced reliance on walking stick.    Time 3    Period Weeks    Status New    Target Date 01/13/21               PT Long Term Goals - 12/23/20 2232       PT LONG TERM GOAL #1   Title Pt will report he is ambulating his normal distance without his walking stick without significant pain or difficulty.    Time --   4-6   Period Weeks    Status New    Target Date 02/03/21      PT LONG TERM GOAL #2   Title Pt will demo improved R hip and R knee extension strength to 5/5 MMT for improved tolerance with and performance of functional mobility and yard work.    Time 6    Period Weeks    Status New    Target Date 02/03/21      PT LONG TERM GOAL #3   Title Pt will be able to perform his normal standing activities without significant R LE pain.    Time --   4-6   Period Weeks    Status New    Target Date 02/03/21      PT LONG TERM GOAL #4   Title pt will be able to return to working out in gym without adverse effects.    Time 6    Period Weeks    Status New    Target Date 02/03/21                   Plan - 12/31/20  1101     Clinical Impression Statement Pt has no pain at rest and has soreness with ambulation.  Pt is not using walking stick though is not sure if he needs to.  PT discussed the reasons why he would need the walking stick vs the reasons why he would need it.  Pt performed exercises well with instruction on correct form.  Pt has tightness in R quad > L quad.  Attempted different quad self stretches and pt had some difficulty with prone quad stretch.  Reviewed and updated HEP today.  Pt responded well to Rx reporting no pain but was tired after Rx.  He reports no pain with ambulation after Rx.  Pt should benefit from cont skilled PT services to address ongoing goals and to restore PLOF.    Comorbidities Cardiac hx of A-fib and PVCs:  3 cardiac ablations and 5 cardioversions with the last one being in 2020.  orthostatic hypotension.  Penile CA:  Pt has had surgeries and chemotherapy and states he has had no signs of CA for 2 years.  Chronic renal disease.  DM.  OA. HTN.  Other PSHx include Abdominal hernia repair and L ankle surgery and bunionectomy with hammertoe reconstruction.    PT Next Visit Plan  Avoid Modalties due to Hx of CA.  Pt continues to be followed for CA though he states he currently does not have CA.  Review and perform HEP.  Cont with ther ex including gentle ROM and stretching and light strengthening per pt tolerance.  Cont with neuro re-ed activities.    PT Home Exercise Plan Access Code: [email or web address].  Updated HEP    Consulted and Agree with Plan of Care Patient             Patient will benefit from skilled therapeutic intervention in order to improve the following deficits and impairments:  Difficulty walking, Decreased activity tolerance, Decreased mobility, Decreased strength, Decreased range of motion, Impaired flexibility, Pain, Hypomobility  Visit Diagnosis: Pain in right thigh  Muscle weakness (generalized)     Problem List Patient Active Problem List   Diagnosis Date  Noted   Secondary hypercoagulable state ([place]) 06/11/2019   Typical atrial flutter ([place]) 05/09/2018   BMI 40.0-44.9, adult ([place]) 05/09/2018   Asthma 01/29/2018   Chronic renal disease 01/29/2018   Gout 01/29/2018   Penile cancer ([place]) 01/29/2018   Persistent atrial fibrillation 11/25/2015   Current use of long term anticoagulation 11/25/2015   H/O type 2 diabetes mellitus 11/25/2015   H/O: HTN (hypertension) 11/25/2015   Balanitis 09/30/2015   Atrial fibrillation ([place]) 06/30/2015   PVC (premature ventricular contraction) 03/30/2015   Hyperlipidemia 06/25/2014   Abnormal EKG 12/25/2013   Obesity 12/25/2013   Essential hypertension, benign 12/25/2013   PVC's (premature ventricular contractions) 05/07/2013   Edema    Orthostatic hypotension    HTN (hypertension)     Selinda Michaels III PT, DPT 12/31/20 2:21 PM   [place], [place], [address] Phone: [phone]   Fax:  [phone]  Name: ARIC JOST MRN: [id number] Date of Birth: [birth year]

## 2021-01-01 DIAGNOSIS — E1122 Type 2 diabetes mellitus with diabetic chronic kidney disease: Secondary | ICD-10-CM | POA: Diagnosis not present

## 2021-01-01 DIAGNOSIS — N041 Nephrotic syndrome with focal and segmental glomerular lesions: Secondary | ICD-10-CM | POA: Diagnosis not present

## 2021-01-01 DIAGNOSIS — N184 Chronic kidney disease, stage 4 (severe): Secondary | ICD-10-CM | POA: Diagnosis not present

## 2021-01-01 DIAGNOSIS — I129 Hypertensive chronic kidney disease with stage 1 through stage 4 chronic kidney disease, or unspecified chronic kidney disease: Secondary | ICD-10-CM | POA: Diagnosis not present

## 2021-01-01 DIAGNOSIS — I4819 Other persistent atrial fibrillation: Secondary | ICD-10-CM | POA: Diagnosis not present

## 2021-01-06 ENCOUNTER — Other Ambulatory Visit: Payer: Self-pay

## 2021-01-06 ENCOUNTER — Ambulatory Visit (HOSPITAL_BASED_OUTPATIENT_CLINIC_OR_DEPARTMENT_OTHER): Payer: Medicare PPO | Admitting: Physical Therapy

## 2021-01-06 ENCOUNTER — Encounter (HOSPITAL_BASED_OUTPATIENT_CLINIC_OR_DEPARTMENT_OTHER): Payer: Self-pay | Admitting: Physical Therapy

## 2021-01-06 DIAGNOSIS — M6281 Muscle weakness (generalized): Secondary | ICD-10-CM | POA: Diagnosis not present

## 2021-01-06 DIAGNOSIS — M79651 Pain in right thigh: Secondary | ICD-10-CM | POA: Diagnosis not present

## 2021-01-07 NOTE — Therapy (Signed)
[place], [place], [address] Phone: [phone]   Fax:  [phone]  Physical Therapy Treatment  Patient Details  Name: Mark Singh MRN: [id number] Date of Birth: 10-03-1945 Referring Provider (PT): Grier Mitts PA-C   Encounter Date: 01/06/2021   PT End of Session - 01/06/21 2305     Visit Number 4    Number of Visits 12    Date for PT Re-Evaluation 02/03/21    Authorization Type [employer]    Progress Note Due on Visit --   01/20/21   PT Start Time 1202    PT Stop Time 3419    PT Time Calculation (min) 33 min    Activity Tolerance Patient tolerated treatment well    Behavior During Therapy [facility] for tasks assessed/performed             Past Medical History:  Diagnosis Date   Arthritis    "hands, feet" (12/13/2017)   Asthma    Bradycardia    a. Repoted h/o HR in the 40s.   Edema    a. 2D echo 11/2013: EF 37-90%, LV diastolic function parameters were normal, severely dilated LA, PASP 54mmHg..   H/O: gout    right foot; on daily RX" (12/13/2017)   HTN (hypertension)    Hyperlipidemia    Myalgia and myositis, unspecified    Nephrotic syndrome    a. responder to steroid therapy by Dr. Lorrene Reid.    Obesity    Orthostatic hypotension    Penile cancer ([place])    "did circ"   Pulmonary hypertension ([place])    a. Mild pulm HTN felt 2/2 obesity.   PVC's (premature ventricular contractions)    a. Holter 05/2013 showing 18k PVCs (18% of the time).   Seasonal asthma    mild   Shoulder pain    Squamous carcinoma    "right hand; left shoulder" (12/13/2017)   Type 2 diabetes mellitus ([facility])     Past Surgical History:  Procedure Laterality Date   A-FLUTTER ABLATION N/A 02/23/2018   Procedure: A-FLUTTER ABLATION;  Surgeon: Constance Haw, MD;  Location: [place] [facility];  Service: Cardiovascular;  Laterality: N/A;   ATRIAL FIBRILLATION ABLATION N/A 12/13/2017   Procedure: ATRIAL  FIBRILLATION ABLATION;  Surgeon: Constance Haw, MD;  Location: [place] [facility];  Service: Cardiovascular;  Laterality: N/A;   BUNIONECTOMY WITH HAMMERTOE RECONSTRUCTION Left 1992  &  2008   REMOVAL HEAL SPUR/ BUNIONECTOMY AND HAMMERTOE REPAIR    CARDIOVERSION N/A 07/22/2015   Procedure: CARDIOVERSION;  Surgeon: Dorothy Spark, MD;  Location: [place];  Service: Cardiovascular;  Laterality: N/A;   CARDIOVERSION N/A 10/06/2015   Procedure: CARDIOVERSION;  Surgeon: Sueanne Margarita, MD;  Location: [place];  Service: Cardiovascular;  Laterality: N/A;   CARDIOVERSION N/A 12/05/2016   Procedure: CARDIOVERSION;  Surgeon: Pixie Casino, MD;  Location: [facility]OS[facility];  Service: Cardiovascular;  Laterality: N/A;   CARDIOVERSION N/A 06/29/2018   Procedure: CARDIOVERSION;  Surgeon: Thayer Headings, MD;  Location: [place];  Service: Cardiovascular;  Laterality: N/A;   CARDIOVERSION N/A 09/20/2018   Procedure: CARDIOVERSION;  Surgeon: [name], MD;  Location: [place];  Service: Cardiovascular;  Laterality: N/A;   CIRCUMCISION N/A 11/27/2012   Procedure:  CIRCUMCISION  AND EXCISION OF GLANS PENIS;  Surgeon: Fredricka Bonine, MD;  Location[facility];  Service: Urology;  Laterality: N/A;   ELECTROPHYSIOLOGIC STUDY N/A 03/30/2015   Procedure: PVC Ablation;  Surgeon: [name], MD;  Location: [place] [facility];  Service: Cardiovascular;  Laterality: N/A;   HERNIA REPAIR     SQUAMOUS CELL CARCINOMA EXCISION     "right hand; left shoulder" (2/87/6811)   UMBILICAL HERNIA REPAIR  11-04-2005    There were no vitals filed for this visit.   Subjective Assessment - 01/06/21 1204     Subjective Pt states it's a little better.  He reports improved pain with ambulation and is not using the walking stick.  Pt has pain with sitting down and bending over.  He denies pain currently.  He is compliant with HEP.    Pertinent History Cardiac hx of A-fib and  PVCs.  3 cardiac ablations for atrial flutters and PVCs.  5 cardioversions with the last one being in 2020.  orthostatic hypotension.  Penile CA:  surgery and chemotherapy in 2014 with resultant surgeries on 2017 and 2020.  Pt states he has had no signs of CA for 2 years.  Pt goes every 6 months to check penile CA for the next 3 years.  Chronic renal disease.  DM.  OA. HTN.  Abdominal hernia repair.  L ankle surgery and bunionectomy with hammertoe reconstruction.    Currently in Pain? No/denies                               [facility] Adult PT Treatment/Exercise - 01/07/21 0001       Exercises   Exercises Knee/Hip   Reviewed response to prior Rx, HEP compliance, pain level, and current function.     Knee/Hip Exercises: Stretches   Other Knee/Hip Stretches supine modified thomas test hip flexor stretch 3 reps x 30 seconds   Pt reports no pain with stretch   Other Knee/Hip Stretches S/L manual hip flexor stretch on R 3x30 sec.  Prone manual quad stretch 3x30 sec bilat.      Knee/Hip Exercises: Standing   Other Standing Knee Exercises Standing SLR 2x10 bilat.  Marching on airex pad 2x10 reps      Knee/Hip Exercises: Supine   Other Supine Knee/Hip Exercises supine bridge 3x10 reps, supine clams with RTB 3 x 10 reps      Knee/Hip Exercises: Sidelying   Other Sidelying Knee/Hip Exercises Hip abd 2x10                     PT Education - 01/06/21 1238     Education Details Reviewed HEP.  Updated HEP and gave pt a HEP handout.  Educated pt in correct form and appropriate frequency of HEP.  Access Code: [facility]  URL: [email or web address]  Date: 01/06/2021  Prepared by: Ronny Flurry    Exercises  Hooklying Clamshell with Resistance - 1 x daily - 3-4 x weekly - 2 sets - 10 reps    Person(s) Educated Patient    Methods Explanation;Verbal cues;Handout    Comprehension Verbalized understanding;Returned demonstration              PT Short Term Goals -  12/23/20 2227       PT SHORT TERM GOAL #1   Title Pt will be independent and compliant with HEP for improved pain, flexibility, strength, and function.    Time 2    Period Weeks    Status New    Target Date 01/06/21      PT SHORT TERM GOAL #2   Title Pt will demo reduced tenderness to  palpation in anterior thigh for reduced inflammation and soreness and improved mobility.    Time 3    Period Weeks    Status New    Target Date 01/13/21      PT SHORT TERM GOAL #3   Title Pt will report reduced pain with ambulation and reduced reliance on walking stick.    Time 3    Period Weeks    Status New    Target Date 01/13/21               PT Long Term Goals - 12/23/20 2232       PT LONG TERM GOAL #1   Title Pt will report he is ambulating his normal distance without his walking stick without significant pain or difficulty.    Time --   4-6   Period Weeks    Status New    Target Date 02/03/21      PT LONG TERM GOAL #2   Title Pt will demo improved R hip and R knee extension strength to 5/5 MMT for improved tolerance with and performance of functional mobility and yard work.    Time 6    Period Weeks    Status New    Target Date 02/03/21      PT LONG TERM GOAL #3   Title Pt will be able to perform his normal standing activities without significant R LE pain.    Time --   4-6   Period Weeks    Status New    Target Date 02/03/21      PT LONG TERM GOAL #4   Title pt will be able to return to working out in gym without adverse effects.    Time 6    Period Weeks    Status New    Target Date 02/03/21                   Plan - 01/06/21 2305     Clinical Impression Statement Pt is progressing well with pain, sx's, and function.  He continues to have tightness in R hip flexor and quad.  PT progressed pt to some standing exercises to improve R hip flexor and LE strength.  Pt reports he could feel it in his R thigh with standing SLR but felt it more when standing on R  to perform L SLR.  Pt had no increased pain after standing exercises.  Pt is improving with ambulation not using his walking stick.  He responded well to Rx having no pain after Rx.  Pt should benefit from cont skilled PT services to address ongoing goals and to restore PLOF.    Comorbidities Cardiac hx of A-fib and PVCs:  3 cardiac ablations and 5 cardioversions with the last one being in 2020.  orthostatic hypotension.  Penile CA:  Pt has had surgeries and chemotherapy and states he has had no signs of CA for 2 years.  Chronic renal disease.  DM.  OA. HTN.  Other PSHx include Abdominal hernia repair and L ankle surgery and bunionectomy with hammertoe reconstruction.    PT Treatment/Interventions ADLs/Self Care Home Management;Gait training;Stair training;Functional mobility training;Therapeutic activities;Therapeutic exercise;Neuromuscular re-education;Manual techniques;Patient/family education;Passive range of motion    PT Next Visit Plan Avoid Modalties due to Hx of CA.  Pt continues to be followed for CA though he states he currently does not have CA.  Review and perform HEP.  Cont with ther ex including gentle ROM and stretching and light strengthening  per pt tolerance.  Cont with neuro re-ed activities.    PT Home Exercise Plan Access Code: [email or web address].  Updated HEP    Consulted and Agree with Plan of Care Patient             Patient will benefit from skilled therapeutic intervention in order to improve the following deficits and impairments:  Difficulty walking, Decreased activity tolerance, Decreased mobility, Decreased strength, Decreased range of motion, Impaired flexibility, Pain, Hypomobility  Visit Diagnosis: Pain in right thigh  Muscle weakness (generalized)     Problem List Patient Active Problem List   Diagnosis Date Noted   Secondary hypercoagulable state ([place]) 06/11/2019   Typical atrial flutter ([place]) 05/09/2018   BMI 40.0-44.9, adult ([place]) 05/09/2018   Asthma 01/29/2018    Chronic renal disease 01/29/2018   Gout 01/29/2018   Penile cancer ([place]) 01/29/2018   Persistent atrial fibrillation 11/25/2015   Current use of long term anticoagulation 11/25/2015   H/O type 2 diabetes mellitus 11/25/2015   H/O: HTN (hypertension) 11/25/2015   Balanitis 09/30/2015   Atrial fibrillation ([place]) 06/30/2015   PVC (premature ventricular contraction) 03/30/2015   Hyperlipidemia 06/25/2014   Abnormal EKG 12/25/2013   Obesity 12/25/2013   Essential hypertension, benign 12/25/2013   PVC's (premature ventricular contractions) 05/07/2013   Edema    Orthostatic hypotension    HTN (hypertension)    Selinda Michaels III PT, DPT 01/07/21 8:28 AM   [place], [place], [address] Phone: [phone]   Fax:  [phone]  Name: Mark Singh MRN: [id number] Date of Birth: 1945/05/31

## 2021-01-08 ENCOUNTER — Ambulatory Visit (HOSPITAL_BASED_OUTPATIENT_CLINIC_OR_DEPARTMENT_OTHER): Payer: Medicare PPO | Admitting: Physical Therapy

## 2021-01-08 ENCOUNTER — Other Ambulatory Visit: Payer: Self-pay

## 2021-01-08 ENCOUNTER — Encounter (HOSPITAL_BASED_OUTPATIENT_CLINIC_OR_DEPARTMENT_OTHER): Payer: Self-pay | Admitting: Physical Therapy

## 2021-01-08 DIAGNOSIS — M79651 Pain in right thigh: Secondary | ICD-10-CM | POA: Diagnosis not present

## 2021-01-08 DIAGNOSIS — M6281 Muscle weakness (generalized): Secondary | ICD-10-CM | POA: Diagnosis not present

## 2021-01-08 NOTE — Therapy (Signed)
[place], [place], [address] Phone: [phone]   Fax:  [phone]  Physical Therapy Treatment  Patient Details  Name: Mark Singh MRN: [id number] Date of Birth: 24-Jun-1945 Referring Provider (PT): Grier Mitts PA-C   Encounter Date: 01/08/2021   PT End of Session - 01/08/21 1152     Visit Number 5    Number of Visits 12    Date for PT Re-Evaluation 02/03/21    Authorization Type [employer]    Progress Note Due on Visit --   01/20/21   PT Start Time 1110    PT Stop Time 1151    PT Time Calculation (min) 41 min    Activity Tolerance Patient tolerated treatment well    Behavior During Therapy [facility] for tasks assessed/performed             Past Medical History:  Diagnosis Date   Arthritis    "hands, feet" (12/13/2017)   Asthma    Bradycardia    a. Repoted h/o HR in the 40s.   Edema    a. 2D echo 11/2013: EF 66-59%, LV diastolic function parameters were normal, severely dilated LA, PASP 4mmHg..   H/O: gout    right foot; on daily RX" (12/13/2017)   HTN (hypertension)    Hyperlipidemia    Myalgia and myositis, unspecified    Nephrotic syndrome    a. responder to steroid therapy by Dr. Lorrene Reid.    Obesity    Orthostatic hypotension    Penile cancer ([place])    "did circ"   Pulmonary hypertension ([place])    a. Mild pulm HTN felt 2/2 obesity.   PVC's (premature ventricular contractions)    a. Holter 05/2013 showing 18k PVCs (18% of the time).   Seasonal asthma    mild   Shoulder pain    Squamous carcinoma    "right hand; left shoulder" (12/13/2017)   Type 2 diabetes mellitus ([place])     Past Surgical History:  Procedure Laterality Date   A-FLUTTER ABLATION N/A 02/23/2018   Procedure: A-FLUTTER ABLATION;  Surgeon: Constance Haw, MD;  Location: [place] [facility];  Service: Cardiovascular;  Laterality: N/A;   ATRIAL FIBRILLATION ABLATION N/A 12/13/2017   Procedure: ATRIAL  FIBRILLATION ABLATION;  Surgeon: Constance Haw, MD;  Location: [place] [facility];  Service: Cardiovascular;  Laterality: N/A;   BUNIONECTOMY WITH HAMMERTOE RECONSTRUCTION Left 1992  &  2008   REMOVAL HEAL SPUR/ BUNIONECTOMY AND HAMMERTOE REPAIR    CARDIOVERSION N/A 07/22/2015   Procedure: CARDIOVERSION;  Surgeon: Dorothy Spark, MD;  Location: [place];  Service: Cardiovascular;  Laterality: N/A;   CARDIOVERSION N/A 10/06/2015   Procedure: CARDIOVERSION;  Surgeon: Sueanne Margarita, MD;  Location: [place];  Service: Cardiovascular;  Laterality: N/A;   CARDIOVERSION N/A 12/05/2016   Procedure: CARDIOVERSION;  Surgeon: Pixie Casino, MD;  Location: [facility] ENDOSCOPY;  Service: Cardiovascular;  Laterality: N/A;   CARDIOVERSION N/A 06/29/2018   Procedure: CARDIOVERSION;  Surgeon: Thayer Headings, MD;  Location: [place];  Service: Cardiovascular;  Laterality: N/A;   CARDIOVERSION N/A 09/20/2018   Procedure: CARDIOVERSION;  Surgeon: [name], MD;  Location: [place];  Service: Cardiovascular;  Laterality: N/A;   CIRCUMCISION N/A 11/27/2012   Procedure:  CIRCUMCISION  AND EXCISION OF GLANS PENIS;  Surgeon: Fredricka Bonine, MD;  Location[facility];  Service: Urology;  Laterality: N/A;   ELECTROPHYSIOLOGIC STUDY N/A 03/30/2015   Procedure: PVC Ablation;  Surgeon: [name], MD;  Location: [place] [facility];  Service: Cardiovascular;  Laterality: N/A;   HERNIA REPAIR     SQUAMOUS CELL CARCINOMA EXCISION     "right hand; left shoulder" (0/35/4656)   UMBILICAL HERNIA REPAIR  11-04-2005    There were no vitals filed for this visit.   Subjective Assessment - 01/08/21 1115     Subjective Pt states it's a little better.  He reports improved pain with ambulation and is not using the walking stick.  Pt has pain with sitting down and bending over.  He denies pain currently.  He is compliant with HEP.  Pt states he does have some soreness with  ambulation that he did not have the day before.  Pt denies any adverse effects after prior Rx.    Pertinent History Cardiac hx of A-fib and PVCs.  3 cardiac ablations for atrial flutters and PVCs.  5 cardioversions with the last one being in 2020.  orthostatic hypotension.  Penile CA:  surgery and chemotherapy in 2014 with resultant surgeries on 2017 and 2020.  Pt states he has had no signs of CA for 2 years.  Pt goes every 6 months to check penile CA for the next 3 years.  Chronic renal disease.  DM.  OA. HTN.  Abdominal hernia repair.  L ankle surgery and bunionectomy with hammertoe reconstruction.    Currently in Pain? No/denies                               [facility] Adult PT Treatment/Exercise - 01/08/21 0001       Therapeutic Activites    Therapeutic Activities Other Therapeutic Activities    Other Therapeutic Activities See flow sheet for step ups and airex pad activities to improve functional mobility and stability with daily functional mobility.      Exercises   Exercises Knee/Hip   Reviewed response to prior Rx, HEP compliance, pain level, and current function.     Knee/Hip Exercises: Stretches   Other Knee/Hip Stretches supine modified thomas test hip flexor stretch 3 reps x 30 seconds.  supine quad stretch in modified thomas test position 2x30 sec   Pt reports no pain with stretch   Other Knee/Hip Stretches S/L manual hip flexor stretch on R 2x30 sec.  Prone manual quad stretch 2x30 sec bilat.      Knee/Hip Exercises: Standing   Other Standing Knee Exercises Standing SLR 2x10 bilat.    Other Standing Knee Exercises step ups on 2 inch step x 10 reps and 4 inch 2x10 reps, standing on airex pad 3x30 sec, marching on airex with UE assist  2x10 reps.      Knee/Hip Exercises: Supine   Other Supine Knee/Hip Exercises supine bridge 3x10 reps, supine clams with RTB 3 x 10 reps      Knee/Hip Exercises: Sidelying   Other Sidelying Knee/Hip Exercises Hip abd 2x10                      PT Education - 01/08/21 2112     Education Details instructed pt to cont with HEP.    Person(s) Educated Patient    Methods Explanation    Comprehension Verbalized understanding              PT Short Term Goals - 12/23/20 2227       PT SHORT TERM GOAL #1   Title Pt will be  independent and compliant with HEP for improved pain, flexibility, strength, and function.    Time 2    Period Weeks    Status New    Target Date 01/06/21      PT SHORT TERM GOAL #2   Title Pt will demo reduced tenderness to palpation in anterior thigh for reduced inflammation and soreness and improved mobility.    Time 3    Period Weeks    Status New    Target Date 01/13/21      PT SHORT TERM GOAL #3   Title Pt will report reduced pain with ambulation and reduced reliance on walking stick.    Time 3    Period Weeks    Status New    Target Date 01/13/21               PT Long Term Goals - 12/23/20 2232       PT LONG TERM GOAL #1   Title Pt will report he is ambulating his normal distance without his walking stick without significant pain or difficulty.    Time --   4-6   Period Weeks    Status New    Target Date 02/03/21      PT LONG TERM GOAL #2   Title Pt will demo improved R hip and R knee extension strength to 5/5 MMT for improved tolerance with and performance of functional mobility and yard work.    Time 6    Period Weeks    Status New    Target Date 02/03/21      PT LONG TERM GOAL #3   Title Pt will be able to perform his normal standing activities without significant R LE pain.    Time --   4-6   Period Weeks    Status New    Target Date 02/03/21      PT LONG TERM GOAL #4   Title pt will be able to return to working out in gym without adverse effects.    Time 6    Period Weeks    Status New    Target Date 02/03/21                   Plan - 01/08/21 2113     Clinical Impression Statement Pt is progressing well with pain, sx's, and  function. He continues to have tightness in R hip flexor and quad.  Pt is improving with ambulation not using his walking stick though reports soreness with ambulation today.  Pt performed exercises well with cuing for correct form.  Pt reports he could feel it in his R thigh with standing SLR but felt it more when standing on R to perform L SLR.   He responded well to Rx having no pain after Rx though was fatigued. Pt should benefit from cont skilled PT services to address ongoing goals and to restore PLOF.    Comorbidities Cardiac hx of A-fib and PVCs:  3 cardiac ablations and 5 cardioversions with the last one being in 2020.  orthostatic hypotension.  Penile CA:  Pt has had surgeries and chemotherapy and states he has had no signs of CA for 2 years.  Chronic renal disease.  DM.  OA. HTN.  Other PSHx include Abdominal hernia repair and L ankle surgery and bunionectomy with hammertoe reconstruction.    PT Next Visit Plan Avoid Modalties due to Hx of CA.  Pt continues to be followed for CA though he states  he currently does not have CA.  Review and perform HEP.  Cont with ther ex including gentle ROM and stretching and light strengthening per pt tolerance.  Cont with neuro re-ed activities.    PT Home Exercise Plan Access Code: [email or web address].             Patient will benefit from skilled therapeutic intervention in order to improve the following deficits and impairments:  Difficulty walking, Decreased activity tolerance, Decreased mobility, Decreased strength, Decreased range of motion, Impaired flexibility, Pain, Hypomobility  Visit Diagnosis: Pain in right thigh  Muscle weakness (generalized)     Problem List Patient Active Problem List   Diagnosis Date Noted   Secondary hypercoagulable state ([place]) 06/11/2019   Typical atrial flutter ([place]) 05/09/2018   BMI 40.0-44.9, adult ([place]) 05/09/2018   Asthma 01/29/2018   Chronic renal disease 01/29/2018   Gout 01/29/2018   Penile cancer ([place])  01/29/2018   Persistent atrial fibrillation 11/25/2015   Current use of long term anticoagulation 11/25/2015   H/O type 2 diabetes mellitus 11/25/2015   H/O: HTN (hypertension) 11/25/2015   Balanitis 09/30/2015   Atrial fibrillation ([place]) 06/30/2015   PVC (premature ventricular contraction) 03/30/2015   Hyperlipidemia 06/25/2014   Abnormal EKG 12/25/2013   Obesity 12/25/2013   Essential hypertension, benign 12/25/2013   PVC's (premature ventricular contractions) 05/07/2013   Edema    Orthostatic hypotension    HTN (hypertension)     Selinda Michaels III PT, DPT 01/08/21 9:16 PM   [place], [place], [address] Phone: [phone]   Fax:  [phone]  Name: Mark Singh MRN: [id number] Date of Birth: 1946-04-14

## 2021-01-11 ENCOUNTER — Other Ambulatory Visit: Payer: Self-pay

## 2021-01-11 ENCOUNTER — Encounter (HOSPITAL_BASED_OUTPATIENT_CLINIC_OR_DEPARTMENT_OTHER): Payer: Self-pay | Admitting: Physical Therapy

## 2021-01-11 ENCOUNTER — Ambulatory Visit (HOSPITAL_BASED_OUTPATIENT_CLINIC_OR_DEPARTMENT_OTHER): Payer: Medicare PPO | Admitting: Physical Therapy

## 2021-01-11 DIAGNOSIS — M79651 Pain in right thigh: Secondary | ICD-10-CM | POA: Diagnosis not present

## 2021-01-11 DIAGNOSIS — M6281 Muscle weakness (generalized): Secondary | ICD-10-CM | POA: Diagnosis not present

## 2021-01-11 NOTE — Therapy (Signed)
[place], [place], [address] Phone: [phone]   Fax:  [phone]  Physical Therapy Treatment  Patient Details  Name: Mark Singh MRN: [id number] Date of Birth: 07-26-1945 Referring Provider (PT): Grier Mitts PA-C   Encounter Date: 01/11/2021   PT End of Session - 01/11/21 1139     Visit Number 6    Number of Visits 12    Date for PT Re-Evaluation 02/03/21    Authorization Type [employer]    PT Start Time 1104    PT Stop Time 1146    PT Time Calculation (min) 42 min    Activity Tolerance Patient tolerated treatment well    Behavior During Therapy [facility] for tasks assessed/performed             Past Medical History:  Diagnosis Date   Arthritis    "hands, feet" (12/13/2017)   Asthma    Bradycardia    a. Repoted h/o HR in the 40s.   Edema    a. 2D echo 11/2013: EF 76-80%, LV diastolic function parameters were normal, severely dilated LA, PASP 3mmHg..   H/O: gout    right foot; on daily RX" (12/13/2017)   HTN (hypertension)    Hyperlipidemia    Myalgia and myositis, unspecified    Nephrotic syndrome    a. responder to steroid therapy by Dr. Lorrene Reid.    Obesity    Orthostatic hypotension    Penile cancer ([place])    "did circ"   Pulmonary hypertension ([place])    a. Mild pulm HTN felt 2/2 obesity.   PVC's (premature ventricular contractions)    a. Holter 05/2013 showing 18k PVCs (18% of the time).   Seasonal asthma    mild   Shoulder pain    Squamous carcinoma    "right hand; left shoulder" (12/13/2017)   Type 2 diabetes mellitus ([place])     Past Surgical History:  Procedure Laterality Date   A-FLUTTER ABLATION N/A 02/23/2018   Procedure: A-FLUTTER ABLATION;  Surgeon: Constance Haw, MD;  Location: [place] [facility];  Service: Cardiovascular;  Laterality: N/A;   ATRIAL FIBRILLATION ABLATION N/A 12/13/2017   Procedure: ATRIAL FIBRILLATION ABLATION;  Surgeon: Constance Haw, MD;  Location: [place] [facility];  Service: Cardiovascular;  Laterality: N/A;   BUNIONECTOMY WITH HAMMERTOE RECONSTRUCTION Left 1992  &  2008   REMOVAL HEAL SPUR/ BUNIONECTOMY AND HAMMERTOE REPAIR    CARDIOVERSION N/A 07/22/2015   Procedure: CARDIOVERSION;  Surgeon: Dorothy Spark, MD;  Location: [place];  Service: Cardiovascular;  Laterality: N/A;   CARDIOVERSION N/A 10/06/2015   Procedure: CARDIOVERSION;  Surgeon: Sueanne Margarita, MD;  Location: [place];  Service: Cardiovascular;  Laterality: N/A;   CARDIOVERSION N/A 12/05/2016   Procedure: CARDIOVERSION;  Surgeon: Pixie Casino, MD;  Location: [facility] ENDOSCOPY;  Service: Cardiovascular;  Laterality: N/A;   CARDIOVERSION N/A 06/29/2018   Procedure: CARDIOVERSION;  Surgeon: Thayer Headings, MD;  Location: [place];  Service: Cardiovascular;  Laterality: N/A;   CARDIOVERSION N/A 09/20/2018   Procedure: CARDIOVERSION;  Surgeon: [name], MD;  Location: [place];  Service: Cardiovascular;  Laterality: N/A;   CIRCUMCISION N/A 11/27/2012   Procedure:  CIRCUMCISION  AND EXCISION OF GLANS PENIS;  Surgeon: Fredricka Bonine, MD;  Location[facility];  Service: Urology;  Laterality: N/A;   ELECTROPHYSIOLOGIC STUDY N/A 03/30/2015   Procedure: PVC Ablation;  Surgeon: [name], MD;  Location: [place] [facility];  Service: Cardiovascular;  Laterality: N/A;   HERNIA REPAIR     SQUAMOUS CELL CARCINOMA EXCISION     "right hand; left shoulder" (5/62/5638)   UMBILICAL HERNIA REPAIR  11-04-2005    There were no vitals filed for this visit.   Subjective Assessment - 01/11/21 1109     Subjective Pt states his walking is about the same, but he is not using the walking stick.  Pt states he was a little more sore with ambulation over the weekend.  Pt has pain with sitting down and ambulation.  He has minimal pain with standing up.  He denies pain currently  and denies pain in sitting.  He is  compliant with HEP.  Pt denies any adverse effects after prior Rx.  Pt reports compliance with HEP.    Pertinent History Cardiac hx of A-fib and PVCs.  3 cardiac ablations for atrial flutters and PVCs.  5 cardioversions with the last one being in 2020.  orthostatic hypotension.  Penile CA:  surgery and chemotherapy in 2014 with resultant surgeries on 2017 and 2020.  Pt states he has had no signs of CA for 2 years.  Pt goes every 6 months to check penile CA for the next 3 years.  Chronic renal disease.  DM.  OA. HTN.  Abdominal hernia repair.  L ankle surgery and bunionectomy with hammertoe reconstruction.    Limitations Walking;Standing    Currently in Pain? No/denies                [facility] PT Assessment - 01/11/21 0001       Observation/Other Assessments   Observations He has pain with bending forward especially if it's more to the Right      Palpation   Palpation comment TTP:  R central mid quad/rectus femoris                           [facility] Adult PT Treatment/Exercise - 01/11/21 0001       Therapeutic Activites    Therapeutic Activities Other Therapeutic Activities    Other Therapeutic Activities See flow sheet for step ups and airex pad activities to improve functional mobility and stability with daily functional mobility.      Exercises   Exercises Knee/Hip   Reviewed response to prior Rx, HEP compliance, pain level, and current function.     Knee/Hip Exercises: Stretches   Other Knee/Hip Stretches supine quad stretch in modified thomas test position 330 sec   Pt reports no pain with stretch   Other Knee/Hip Stretches S/L manual hip flexor stretch on R 3x30 sec.  Prone manual quad stretch 3x30 sec bilat.      Knee/Hip Exercises: Standing   Other Standing Knee Exercises Standing SLR 2x10 bilat. YTB with R LE.    Other Standing Knee Exercises step ups on 2 inch step x 10 reps and 4 inch 2x10 reps, standing on airex pad 3x30 sec, marching on airex with UE assist   3x10 reps.  weight shifts on airex pad x 10 reps each f/b and s/s.      Knee/Hip Exercises: Supine   Other Supine Knee/Hip Exercises supine bridge 3x10 reps, supine clams with RTB 3 x 10 reps      Knee/Hip Exercises: Sidelying   Other Sidelying Knee/Hip Exercises Hip abd 2x10                       PT Short Term  Goals - 01/11/21 1106       PT SHORT TERM GOAL #1   Title Pt will be independent and compliant with HEP for improved pain, flexibility, strength, and function.    Time 2    Period Weeks    Status Achieved    Target Date 01/06/21      PT SHORT TERM GOAL #2   Title Pt will demo reduced tenderness to palpation in anterior thigh for reduced inflammation and soreness and improved mobility.    Time 3    Period Weeks    Status On-going    Target Date 01/13/21      PT SHORT TERM GOAL #3   Title Pt will report reduced pain with ambulation and reduced reliance on walking stick.    Time 3    Period Weeks    Status Partially Met    Target Date 01/13/21               PT Long Term Goals - 12/23/20 2232       PT LONG TERM GOAL #1   Title Pt will report he is ambulating his normal distance without his walking stick without significant pain or difficulty.    Time --   4-6   Period Weeks    Status New    Target Date 02/03/21      PT LONG TERM GOAL #2   Title Pt will demo improved R hip and R knee extension strength to 5/5 MMT for improved tolerance with and performance of functional mobility and yard work.    Time 6    Period Weeks    Status New    Target Date 02/03/21      PT LONG TERM GOAL #3   Title Pt will be able to perform his normal standing activities without significant R LE pain.    Time --   4-6   Period Weeks    Status New    Target Date 02/03/21      PT LONG TERM GOAL #4   Title pt will be able to return to working out in gym without adverse effects.    Time 6    Period Weeks    Status New    Target Date 02/03/21                    Plan - 01/11/21 1116     Clinical Impression Statement Pt is still tender at mid central quad though not in proximal quad/rectus femoris.  He reports his ambulation is about the same, but is not using the walking stick with ambulation.  He continues to have pain with bending over and sitting down.  He reports compliance with HEP and reports no increased pain after performing HEP and after Rx.  PT progresed exercises slowly to improve WB'ing and strength.   Pt reports having soreness in R quad when lifting L LE and increasing WB'ing thru R LE with standing exercises.  He responded well to Rx reporting no increased pain after Rx.    Comorbidities Cardiac hx of A-fib and PVCs:  3 cardiac ablations and 5 cardioversions with the last one being in 2020.  orthostatic hypotension.  Penile CA:  Pt has had surgeries and chemotherapy and states he has had no signs of CA for 2 years.  Chronic renal disease.  DM.  OA. HTN.  Other PSHx include Abdominal hernia repair and L ankle surgery and bunionectomy with hammertoe reconstruction.  Pt  met STG#1 and partially met STG#3.  Pt should benefit from cont skilled PT services to address ongoing goals and restore PLOF.    PT Treatment/Interventions ADLs/Self Care Home Management;Gait training;Stair training;Functional mobility training;Therapeutic activities;Therapeutic exercise;Neuromuscular re-education;Manual techniques;Patient/family education;Passive range of motion    PT Next Visit Plan Avoid Modalties due to Hx of CA.  Pt continues to be followed for CA though he states he currently does not have CA.  Review and perform HEP.  Cont with ther ex including gentle ROM and stretching and light strengthening per pt tolerance.  Cont with neuro re-ed activities.    PT Home Exercise Plan Access Code: [email or web address].    Consulted and Agree with Plan of Care Patient             Patient will benefit from skilled therapeutic intervention in order to improve the  following deficits and impairments:  Difficulty walking, Decreased activity tolerance, Decreased mobility, Decreased strength, Decreased range of motion, Impaired flexibility, Pain, Hypomobility  Visit Diagnosis: Pain in right thigh  Muscle weakness (generalized)     Problem List Patient Active Problem List   Diagnosis Date Noted   Secondary hypercoagulable state ([place]) 06/11/2019   Typical atrial flutter ([place]) 05/09/2018   BMI 40.0-44.9, adult ([place]) 05/09/2018   Asthma 01/29/2018   Chronic renal disease 01/29/2018   Gout 01/29/2018   Penile cancer ([place]) 01/29/2018   Persistent atrial fibrillation 11/25/2015   Current use of long term anticoagulation 11/25/2015   H/O type 2 diabetes mellitus 11/25/2015   H/O: HTN (hypertension) 11/25/2015   Balanitis 09/30/2015   Atrial fibrillation ([place]) 06/30/2015   PVC (premature ventricular contraction) 03/30/2015   Hyperlipidemia 06/25/2014   Abnormal EKG 12/25/2013   Obesity 12/25/2013   Essential hypertension, benign 12/25/2013   PVC's (premature ventricular contractions) 05/07/2013   Edema    Orthostatic hypotension    HTN (hypertension)     Selinda Michaels III PT, DPT 01/11/21 1:45 PM   [place] [address] [place], [place], [address] Phone: [phone]   Fax:  [phone]  Name: ROONEY SWAILS MRN: [id number] Date of Birth: 05-Nov-1945

## 2021-01-12 DIAGNOSIS — D6869 Other thrombophilia: Secondary | ICD-10-CM | POA: Diagnosis not present

## 2021-01-12 DIAGNOSIS — I129 Hypertensive chronic kidney disease with stage 1 through stage 4 chronic kidney disease, or unspecified chronic kidney disease: Secondary | ICD-10-CM | POA: Diagnosis not present

## 2021-01-12 DIAGNOSIS — E1122 Type 2 diabetes mellitus with diabetic chronic kidney disease: Secondary | ICD-10-CM | POA: Diagnosis not present

## 2021-01-12 DIAGNOSIS — J45909 Unspecified asthma, uncomplicated: Secondary | ICD-10-CM | POA: Diagnosis not present

## 2021-01-12 DIAGNOSIS — K219 Gastro-esophageal reflux disease without esophagitis: Secondary | ICD-10-CM | POA: Diagnosis not present

## 2021-01-12 DIAGNOSIS — M109 Gout, unspecified: Secondary | ICD-10-CM | POA: Diagnosis not present

## 2021-01-12 DIAGNOSIS — Z6838 Body mass index (BMI) 38.0-38.9, adult: Secondary | ICD-10-CM | POA: Diagnosis not present

## 2021-01-15 ENCOUNTER — Other Ambulatory Visit: Payer: Self-pay

## 2021-01-15 ENCOUNTER — Encounter (HOSPITAL_BASED_OUTPATIENT_CLINIC_OR_DEPARTMENT_OTHER): Payer: Self-pay | Admitting: Physical Therapy

## 2021-01-15 ENCOUNTER — Ambulatory Visit (HOSPITAL_BASED_OUTPATIENT_CLINIC_OR_DEPARTMENT_OTHER): Payer: Medicare PPO | Admitting: Physical Therapy

## 2021-01-15 DIAGNOSIS — M6281 Muscle weakness (generalized): Secondary | ICD-10-CM | POA: Diagnosis not present

## 2021-01-15 DIAGNOSIS — M79651 Pain in right thigh: Secondary | ICD-10-CM

## 2021-01-15 NOTE — Therapy (Signed)
[place], [place], [address] Phone: [phone]   Fax:  [phone]  Physical Therapy Treatment  Patient Details  Name: Mark Singh MRN: [id number] Date of Birth: 09-17-1945 Referring Provider (PT): Grier Mitts PA-C   Encounter Date: 01/15/2021   PT End of Session - 01/15/21 1148     Visit Number 7    Number of Visits 12    Date for PT Re-Evaluation 02/03/21    Authorization Type [employer]    Progress Note Due on Visit --   01/20/21   PT Start Time 1111    PT Stop Time 1155    PT Time Calculation (min) 44 min    Activity Tolerance Patient tolerated treatment well    Behavior During Therapy [facility] for tasks assessed/performed             Past Medical History:  Diagnosis Date   Arthritis    "hands, feet" (12/13/2017)   Asthma    Bradycardia    a. Repoted h/o HR in the 40s.   Edema    a. 2D echo 11/2013: EF 19-37%, LV diastolic function parameters were normal, severely dilated LA, PASP 77mHg..   H/O: gout    right foot; on daily RX" (12/13/2017)   HTN (hypertension)    Hyperlipidemia    Myalgia and myositis, unspecified    Nephrotic syndrome    a. responder to steroid therapy by Dr. DLorrene Reid    Obesity    Orthostatic hypotension    Penile cancer ([place]    "did circ"   [place]    a. Mild pulm HTN felt 2/2 obesity.   PVC's (premature ventricular contractions)    a. Holter 05/2013 showing 18k PVCs (18% of the time).   Seasonal asthma    mild   Shoulder pain    Squamous carcinoma    "right hand; left shoulder" (12/13/2017)   Type 2 diabetes mellitus ([place]     Past Surgical History:  Procedure Laterality Date   A-FLUTTER ABLATION N/A 02/23/2018   Procedure: A-FLUTTER ABLATION;  Surgeon: CConstance Haw MD;  [facility];  Service: Cardiovascular;  Laterality: N/A;   ATRIAL FIBRILLATION ABLATION N/A 12/13/2017   Procedure: ATRIAL  FIBRILLATION ABLATION;  Surgeon: CConstance Haw MD;  [facility];  Service: Cardiovascular;  Laterality: N/A;   BUNIONECTOMY WITH HAMMERTOE RECONSTRUCTION Left 1992  &  2008   REMOVAL HEAL SPUR/ BUNIONECTOMY AND HAMMERTOE REPAIR    CARDIOVERSION N/A 07/22/2015   Procedure: CARDIOVERSION;  Surgeon: KDorothy Spark MD;  [place]  Service: Cardiovascular;  Laterality: N/A;   CARDIOVERSION N/A 10/06/2015   Procedure: CARDIOVERSION;  Surgeon: TSueanne Margarita MD;  [place]  Service: Cardiovascular;  Laterality: N/A;   CARDIOVERSION N/A 12/05/2016   Procedure: CARDIOVERSION;  Surgeon: HPixie Casino MD;  [facility];  Service: Cardiovascular;  Laterality: N/A;   CARDIOVERSION N/A 06/29/2018   Procedure: CARDIOVERSION;  Surgeon: NThayer Headings MD;  [place]  Service: Cardiovascular;  Laterality: N/A;   CARDIOVERSION N/A 09/20/2018   Procedure: CARDIOVERSION;  Surgeon: RSkeet Latch MD;  [place]  Service: Cardiovascular;  Laterality: N/A;   CIRCUMCISION N/A 11/27/2012   Procedure:  CIRCUMCISION  AND EXCISION OF GLANS PENIS;  Surgeon: MFredricka Bonine MD;  [facility]  Service: Urology;  Laterality: N/A;   ELECTROPHYSIOLOGIC STUDY N/A 03/30/2015   Procedure: PVC Ablation;  Surgeon: [name], MD;  Location: [place] [facility];  Service: Cardiovascular;  Laterality: N/A;   HERNIA REPAIR     SQUAMOUS CELL CARCINOMA EXCISION     "right hand; left shoulder" (0/25/4270)   UMBILICAL HERNIA REPAIR  11-04-2005    There were no vitals filed for this visit.   Subjective Assessment - 01/15/21 1116     Subjective Pt states his walking is about the same, but he is not using the walking stick.  "Still hurts when I walk".  Pt reports he had soreness after prior Rx.  He noticed some pain getting up and down from workout machines the following day.  Pt states he was a  little more sore with ambulation over the past weekend.  Pt has pain with sitting down and ambulation.  He has minimal pain with standing up.  He denies pain currently and denies pain in sitting.  He is compliant with HEP.  Pt reports compliance with HEP.  Pt states his pain has improved overall though doesn't report any specific improvement in function.  He can feel it if he walks to mailbox and back.  Pt sees MD next week.    Pertinent History Cardiac hx of A-fib and PVCs.  3 cardiac ablations for atrial flutters and PVCs.  5 cardioversions with the last one being in 2020.  orthostatic hypotension.  Penile CA:  surgery and chemotherapy in 2014 with resultant surgeries on 2017 and 2020.  Pt states he has had no signs of CA for 2 years.  Pt goes every 6 months to check penile CA for the next 3 years.  Chronic renal disease.  DM.  OA. HTN.  Abdominal hernia repair.  L ankle surgery and bunionectomy with hammertoe reconstruction.    Currently in Pain? No/denies                               [facility] Adult PT Treatment/Exercise - 01/15/21 0001       Therapeutic Activites    Therapeutic Activities Other Therapeutic Activities    Other Therapeutic Activities See flow sheet for step ups, hip hikes, and airex pad activities to improve functional mobility and stability with daily functional mobility.      Exercises   Exercises Knee/Hip   Reviewed response to prior Rx, HEP compliance, pain level, and current function.     Knee/Hip Exercises: Stretches   Other Knee/Hip Stretches supine quad stretch in modified thomas test position 3x30 sec   Pt reports no pain with stretch   Other Knee/Hip Stretches S/L manual hip flexor stretch on R 3x30 sec.  Prone manual quad stretch 3x30 sec bilat.      Knee/Hip Exercises: Standing   Other Standing Knee Exercises Standing SLR 2x10 with YTB bilat.    Other Standing Knee Exercises step ups on 4 inch 2x10 reps, marching on airex with UE assist  3x10  reps.  weight shifts on airex pad x 15 reps each f/b and s/s.  Hip hiking x 10 reps each on floor and on 4 inch step bilat      Knee/Hip Exercises: Supine   Other Supine Knee/Hip Exercises supine bridge 3x10 reps, supine clams with GTB 3 x 10 reps                       PT Short Term Goals - 01/11/21 1106       PT  SHORT TERM GOAL #1   Title Pt will be independent and compliant with HEP for improved pain, flexibility, strength, and function.    Time 2    Period Weeks    Status Achieved    Target Date 01/06/21      PT SHORT TERM GOAL #2   Title Pt will demo reduced tenderness to palpation in anterior thigh for reduced inflammation and soreness and improved mobility.    Time 3    Period Weeks    Status On-going    Target Date 01/13/21      PT SHORT TERM GOAL #3   Title Pt will report reduced pain with ambulation and reduced reliance on walking stick.    Time 3    Period Weeks    Status Partially Met    Target Date 01/13/21               PT Long Term Goals - 12/23/20 2232       PT LONG TERM GOAL #1   Title Pt will report he is ambulating his normal distance without his walking stick without significant pain or difficulty.    Time --   4-6   Period Weeks    Status New    Target Date 02/03/21      PT LONG TERM GOAL #2   Title Pt will demo improved R hip and R knee extension strength to 5/5 MMT for improved tolerance with and performance of functional mobility and yard work.    Time 6    Period Weeks    Status New    Target Date 02/03/21      PT LONG TERM GOAL #3   Title Pt will be able to perform his normal standing activities without significant R LE pain.    Time --   4-6   Period Weeks    Status New    Target Date 02/03/21      PT LONG TERM GOAL #4   Title pt will be able to return to working out in gym without adverse effects.    Time 6    Period Weeks    Status New    Target Date 02/03/21                   Plan - 01/15/21 1149      Clinical Impression Statement Pt reports improved pain overall though no specific functional improvements.  Pt continues to have pain with ambulation and leaning forward.  PT is slowly progressing strengthening and WB'ing exercises.  He performed exercises well with cuing for correct form.  Pt responded well to Rx having no pain after Rx and stating he felt less tension in his thigh.    Comorbidities Cardiac hx of A-fib and PVCs:  3 cardiac ablations and 5 cardioversions with the last one being in 2020.  orthostatic hypotension.  Penile CA:  Pt has had surgeries and chemotherapy and states he has had no signs of CA for 2 years.  Chronic renal disease.  DM.  OA. HTN.  Other PSHx include Abdominal hernia repair and L ankle surgery and bunionectomy with hammertoe reconstruction.  Pt met STG#1 and partially met STG#3.  Pt should benefit from cont skilled PT services to address ongoing goals and restore PLOF.    PT Treatment/Interventions ADLs/Self Care Home Management;Gait training;Stair training;Functional mobility training;Therapeutic activities;Therapeutic exercise;Neuromuscular re-education;Manual techniques;Patient/family education;Passive range of motion    PT Next Visit Plan Avoid Modalties due to Hx of  CA.  Pt continues to be followed for CA though he states he currently does not have CA.  Cont with progressing ther ex and [place] activities.  Cont with  stretching and neuro re-ed activities.    PT Home Exercise Plan Access Code: [email or web address].    Consulted and Agree with Plan of Care Patient             Patient will benefit from skilled therapeutic intervention in order to improve the following deficits and impairments:  Difficulty walking, Decreased activity tolerance, Decreased mobility, Decreased strength, Decreased range of motion, Impaired flexibility, Pain, Hypomobility  Visit Diagnosis: Pain in right thigh  Muscle weakness (generalized)     Problem List Patient Active Problem List    Diagnosis Date Noted   Secondary hypercoagulable state ([place]) 06/11/2019   Typical atrial flutter ([place]) 05/09/2018   BMI 40.0-44.9, adult ([place]) 05/09/2018   Asthma 01/29/2018   Chronic renal disease 01/29/2018   Gout 01/29/2018   Penile cancer ([place]) 01/29/2018   Persistent atrial fibrillation 11/25/2015   Current use of long term anticoagulation 11/25/2015   H/O type 2 diabetes mellitus 11/25/2015   H/O: HTN (hypertension) 11/25/2015   Balanitis 09/30/2015   Atrial fibrillation ([place]) 06/30/2015   PVC (premature ventricular contraction) 03/30/2015   Hyperlipidemia 06/25/2014   Abnormal EKG 12/25/2013   Obesity 12/25/2013   Essential hypertension, benign 12/25/2013   PVC's (premature ventricular contractions) 05/07/2013   Edema    Orthostatic hypotension    HTN (hypertension)     Selinda Michaels III PT, DPT 01/15/21 1:16 PM   [place] [address] [place], [place], [address] Phone: [phone]   Fax:  [phone]  Name: OLUWADAMILARE TOBLER MRN: [id number] Date of Birth: [birth year]

## 2021-01-18 ENCOUNTER — Ambulatory Visit (HOSPITAL_BASED_OUTPATIENT_CLINIC_OR_DEPARTMENT_OTHER): Payer: Medicare PPO | Admitting: Physical Therapy

## 2021-01-18 ENCOUNTER — Encounter (HOSPITAL_BASED_OUTPATIENT_CLINIC_OR_DEPARTMENT_OTHER): Payer: Self-pay | Admitting: Physical Therapy

## 2021-01-18 ENCOUNTER — Other Ambulatory Visit: Payer: Self-pay

## 2021-01-18 DIAGNOSIS — M6281 Muscle weakness (generalized): Secondary | ICD-10-CM

## 2021-01-18 DIAGNOSIS — M79651 Pain in right thigh: Secondary | ICD-10-CM | POA: Diagnosis not present

## 2021-01-18 NOTE — Therapy (Signed)
[place], [place], [address] Phone: [phone]   Fax:  [phone]  Physical Therapy Treatment  Patient Details  Name: CHAZZ PHILSON MRN: [id number] Date of Birth: 12-01-1945 Referring Provider (PT): Grier Mitts PA-C   Encounter Date: 01/18/2021   PT End of Session - 01/18/21 1117     Visit Number 8    Number of Visits 12    Date for PT Re-Evaluation 02/03/21    Authorization Type [employer]    PT Start Time 1112    PT Stop Time 1154    PT Time Calculation (min) 42 min    Activity Tolerance Patient tolerated treatment well    Behavior During Therapy [facility] for tasks assessed/performed             Past Medical History:  Diagnosis Date   Arthritis    "hands, feet" (12/13/2017)   Asthma    Bradycardia    a. Repoted h/o HR in the 40s.   Edema    a. 2D echo 11/2013: EF 29-92%, LV diastolic function parameters were normal, severely dilated LA, PASP 67mHg..   H/O: gout    right foot; on daily RX" (12/13/2017)   HTN (hypertension)    Hyperlipidemia    Myalgia and myositis, unspecified    Nephrotic syndrome    a. responder to steroid therapy by Dr. DLorrene Reid    Obesity    Orthostatic hypotension    [place]    "[place]"   Pulmonary hypertension ([place]    a. Mild pulm HTN felt 2/2 obesity.   PVC's (premature ventricular contractions)    a. Holter 05/2013 showing 18k PVCs (18% of the time).   Seasonal asthma    mild   Shoulder pain    Squamous carcinoma    "right hand; left shoulder" (12/13/2017)   Type 2 diabetes mellitus ([place]     Past Surgical History:  Procedure Laterality Date   A-FLUTTER ABLATION N/A 02/23/2018   Procedure: A-FLUTTER ABLATION;  Surgeon: CConstance Haw MD;  [facility];  Service: Cardiovascular;  Laterality: N/A;   ATRIAL FIBRILLATION ABLATION N/A 12/13/2017   Procedure: ATRIAL FIBRILLATION ABLATION;  Surgeon: CConstance Haw MD;  [facility];  Service: Cardiovascular;  Laterality: N/A;   BUNIONECTOMY WITH HAMMERTOE RECONSTRUCTION Left 1992  &  2008   REMOVAL HEAL SPUR/ BUNIONECTOMY AND HAMMERTOE REPAIR    CARDIOVERSION N/A 07/22/2015   Procedure: CARDIOVERSION;  Surgeon: KDorothy Spark MD;  [place]  Service: Cardiovascular;  Laterality: N/A;   CARDIOVERSION N/A 10/06/2015   Procedure: CARDIOVERSION;  Surgeon: TSueanne Margarita MD;  [place]  Service: Cardiovascular;  Laterality: N/A;   CARDIOVERSION N/A 12/05/2016   Procedure: CARDIOVERSION;  Surgeon: HPixie Casino MD;  [facility];  Service: Cardiovascular;  Laterality: N/A;   CARDIOVERSION N/A 06/29/2018   Procedure: CARDIOVERSION;  Surgeon: NThayer Headings MD;  [place]  Service: Cardiovascular;  Laterality: N/A;   CARDIOVERSION N/A 09/20/2018   Procedure: CARDIOVERSION;  Surgeon: RSkeet Latch MD;  [place]  Service: Cardiovascular;  Laterality: N/A;   CIRCUMCISION N/A 11/27/2012   Procedure:  CIRCUMCISION  AND EXCISION OF GLANS PENIS;  Surgeon: MFredricka Bonine MD;  [facility]  Service: Urology;  Laterality: N/A;   ELECTROPHYSIOLOGIC STUDY N/A 03/30/2015   Procedure: PVC Ablation;  Surgeon: Will MMeredith Leeds MD;  [facility];  Service: Cardiovascular;  Laterality: N/A;   HERNIA REPAIR     SQUAMOUS CELL CARCINOMA EXCISION     "right hand; left shoulder" (7/41/6384)   UMBILICAL HERNIA REPAIR  11-04-2005    There were no vitals filed for this visit.   Subjective Assessment - 01/18/21 1115     Subjective Pt states he is a little better and reports reduced soreness with ambulation.  He reports having tightness in R thigh and states his leg gets tired quickly with ambulation.    Pt still has some pain with steps though reports reduced pain with steps.  Pt denies any adverse effects after prior Rx and denies  pain currently.  Pt sees MD tomorrow.    Pertinent History Cardiac hx of A-fib and PVCs.  3 cardiac ablations for atrial flutters and PVCs.  5 cardioversions with the last one being in 2020.  orthostatic hypotension.  Penile CA:  surgery and chemotherapy in 2014 with resultant surgeries on 2017 and 2020.  Pt states he has had no signs of CA for 2 years.  Pt goes every 6 months to check penile CA for the next 3 years.  Chronic renal disease.  DM.  OA. HTN.  Abdominal hernia repair.  L ankle surgery and bunionectomy with hammertoe reconstruction.    Currently in Pain? No/denies                [facility] PT Assessment - 01/18/21 0001       Palpation   Palpation comment Pt had no tenderness to palpation in R central mid quad/rectus femoris                           [facility] Adult PT Treatment/Exercise - 01/18/21 0001       Therapeutic Activites    Therapeutic Activities Other Therapeutic Activities    Other Therapeutic Activities See flow sheet for step ups, hip hikes, and airex pad activities to improve functional mobility and stability with daily functional mobility.      Exercises   Exercises Knee/Hip   Reviewed response to prior Rx, HEP compliance, pain level, and current function.  Assessed tenderness to palpation.     Knee/Hip Exercises: Stretches   Other Knee/Hip Stretches supine quad stretch in modified thomas test position 3x30 sec   Pt reports no pain with stretch   Other Knee/Hip Stretches S/L manual hip flexor stretch on R 3x30 sec.  Prone manual quad stretch 3x30 sec bilat.      Knee/Hip Exercises: Standing   Other Standing Knee Exercises Standing SLR 2x10 with YTB bilat.    Other Standing Knee Exercises step ups on 4 inch 2x10 reps, marching on airex with UE assist  3x10 reps.  weight shifts on airex pad x 15 reps each f/b and s/s.  Hip hiking x 10 reps each on floor and on 4 inch step bilat      Knee/Hip Exercises: Supine   Other Supine Knee/Hip Exercises  supine bridge 3x10 reps, supine clams with GTB 3 x 10 reps                     PT Education - 01/18/21 2243     Education Details Educated pt in progress toward goals.  Eduated pt in [place].    Person(s) Educated Patient    Methods Explanation    Comprehension Verbalized understanding              PT  Short Term Goals - 01/18/21 1148       PT SHORT TERM GOAL #1   Title Pt will be independent and compliant with HEP for improved pain, flexibility, strength, and function.    Time 2    Period Weeks    Status Achieved    Target Date 01/06/21      PT SHORT TERM GOAL #2   Title Pt will demo reduced tenderness to palpation in anterior thigh for reduced inflammation and soreness and improved mobility.    Time 3    Period Weeks    Status Achieved    Target Date 01/13/21      PT SHORT TERM GOAL #3   Title Pt will report reduced pain with ambulation and reduced reliance on walking stick.    Time 3    Period Weeks    Status Achieved    Target Date 01/13/21               PT Long Term Goals - 12/23/20 2232       PT LONG TERM GOAL #1   Title Pt will report he is ambulating his normal distance without his walking stick without significant pain or difficulty.    Time --   4-6   Period Weeks    Status New    Target Date 02/03/21      PT LONG TERM GOAL #2   Title Pt will demo improved R hip and R knee extension strength to 5/5 MMT for improved tolerance with and performance of functional mobility and yard work.    Time 6    Period Weeks    Status New    Target Date 02/03/21      PT LONG TERM GOAL #3   Title Pt will be able to perform his normal standing activities without significant R LE pain.    Time --   4-6   Period Weeks    Status New    Target Date 02/03/21      PT LONG TERM GOAL #4   Title pt will be able to return to working out in gym without adverse effects.    Time 6    Period Weeks    Status New    Target Date 02/03/21                    Plan - 01/18/21 1141     Clinical Impression Statement Pt is progresing slowly but steadily in PT.  Pt presents to Rx reporting improved soreness/pain with ambulation and steps.  Pt had no tenderness with palpation of R central mid quad/rectus femoris.  Pt has met all STG's.  He continues to have soreness with ambulation and pain with bending forward.  PT is slowly progressing strengthening and WB'ing exercises and Pt is performing without adverse effects.  Pt responded well to Rx having no increased pain after Rx.  Pt should benefit from cont skilled PT services to address ongoing goals and to restore PLOF.    Comorbidities Cardiac hx of A-fib and PVCs:  3 cardiac ablations and 5 cardioversions with the last one being in 2020.  orthostatic hypotension.  Penile CA:  Pt has had surgeries and chemotherapy and states he has had no signs of CA for 2 years.  Chronic renal disease.  DM.  OA. HTN.  Other PSHx include Abdominal hernia repair and L ankle surgery and bunionectomy with hammertoe reconstruction.  Pt met STG#1 and partially met STG#3.  Pt should benefit from cont skilled PT services to address ongoing goals and restore PLOF.    PT Treatment/Interventions ADLs/Self Care Home Management;Gait training;Stair training;Functional mobility training;Therapeutic activities;Therapeutic exercise;Neuromuscular re-education;Manual techniques;Patient/family education;Passive range of motion    PT Next Visit Plan Avoid Modalties due to Hx of CA.  Pt continues to be followed for CA though he states he currently does not have CA.  Cont with progressing ther ex and [place] activities.  Cont with  stretching and neuro re-ed activities.    PT Home Exercise Plan Access Code: [email or web address].    Consulted and Agree with Plan of Care Patient             Patient will benefit from skilled therapeutic intervention in order to improve the following deficits and impairments:  Difficulty walking, Decreased activity  tolerance, Decreased mobility, Decreased strength, Decreased range of motion, Impaired flexibility, Pain, Hypomobility  Visit Diagnosis: Pain in right thigh  Muscle weakness (generalized)     Problem List Patient Active Problem List   Diagnosis Date Noted   Secondary hypercoagulable state ([place]) 06/11/2019   Typical atrial flutter ([place]) 05/09/2018   BMI 40.0-44.9, adult ([place]) 05/09/2018   Asthma 01/29/2018   Chronic renal disease 01/29/2018   Gout 01/29/2018   Penile cancer ([place]) 01/29/2018   Persistent atrial fibrillation 11/25/2015   Current use of long term anticoagulation 11/25/2015   H/O type 2 diabetes mellitus 11/25/2015   H/O: HTN (hypertension) 11/25/2015   Balanitis 09/30/2015   Atrial fibrillation ([place]) 06/30/2015   PVC (premature ventricular contraction) 03/30/2015   Hyperlipidemia 06/25/2014   Abnormal EKG 12/25/2013   Obesity 12/25/2013   Essential hypertension, benign 12/25/2013   PVC's (premature ventricular contractions) 05/07/2013   Edema    Orthostatic hypotension    HTN (hypertension)     Selinda Michaels III PT, DPT 01/18/21 10:48 PM   [place] [address] [place], [place], [address] Phone: [phone]   Fax:  [phone]  Name: CHRISTOPHER GLASSCOCK MRN: [id number] Date of Birth: [birth year]

## 2021-01-19 DIAGNOSIS — M25551 Pain in right hip: Secondary | ICD-10-CM | POA: Diagnosis not present

## 2021-01-22 ENCOUNTER — Encounter (HOSPITAL_BASED_OUTPATIENT_CLINIC_OR_DEPARTMENT_OTHER): Payer: Self-pay | Admitting: Physical Therapy

## 2021-01-22 ENCOUNTER — Ambulatory Visit (HOSPITAL_BASED_OUTPATIENT_CLINIC_OR_DEPARTMENT_OTHER): Payer: Medicare PPO | Admitting: Physical Therapy

## 2021-01-22 ENCOUNTER — Other Ambulatory Visit: Payer: Self-pay

## 2021-01-22 DIAGNOSIS — M79651 Pain in right thigh: Secondary | ICD-10-CM | POA: Diagnosis not present

## 2021-01-22 DIAGNOSIS — M6281 Muscle weakness (generalized): Secondary | ICD-10-CM

## 2021-01-22 NOTE — Therapy (Signed)
[place], [place], [address] Phone: [phone]   Fax:  [phone]  Physical Therapy Treatment/Progress Note  Progress Note Reporting Period 12/23/2020 to 01/22/2021  See note below for Objective Data and Assessment of Progress/Goals.      Patient Details  Name: Mark Singh MRN: [id number] Date of Birth: 1946/02/26 Referring Provider (PT): Grier Mitts PA-C   Encounter Date: 01/22/2021   PT End of Session - 01/22/21 1140     Visit Number 9    Number of Visits 17    Date for PT Re-Evaluation 02/20/21    Authorization Type [employer]    Progress Note Due on Visit --   02/20/2021   PT Start Time 1104    PT Stop Time 1150    PT Time Calculation (min) 46 min    Activity Tolerance Patient tolerated treatment well    Behavior During Therapy [facility] for tasks assessed/performed             Past Medical History:  Diagnosis Date   Arthritis    "hands, feet" (12/13/2017)   Asthma    Bradycardia    a. Repoted h/o HR in the 40s.   Edema    a. 2D echo 11/2013: EF 35-32%, LV diastolic function parameters were normal, severely dilated LA, PASP 67mHg..   H/O: gout    right foot; on daily RX" (12/13/2017)   HTN (hypertension)    Hyperlipidemia    Myalgia and myositis, unspecified    Nephrotic syndrome    a. responder to steroid therapy by Dr. DLorrene Reid    Obesity    Orthostatic hypotension    Penile cancer ([place]    "did circ"   Pulmonary hypertension ([place]    a. Mild pulm HTN felt 2/2 obesity.   PVC's (premature ventricular contractions)    a. Holter 05/2013 showing 18k PVCs (18% of the time).   Seasonal asthma    mild   Shoulder pain    Squamous carcinoma    "right hand; left shoulder" (12/13/2017)   Type 2 diabetes mellitus ([place]     Past Surgical History:  Procedure Laterality Date   A-FLUTTER ABLATION N/A 02/23/2018   Procedure: A-FLUTTER ABLATION;  Surgeon: CConstance Haw MD;  [facility];  Service: Cardiovascular;  Laterality: N/A;   ATRIAL FIBRILLATION ABLATION N/A 12/13/2017   Procedure: ATRIAL FIBRILLATION ABLATION;  Surgeon: CConstance Haw MD;  [facility];  Service: Cardiovascular;  Laterality: N/A;   BUNIONECTOMY WITH HAMMERTOE RECONSTRUCTION Left 1992  &  2008   REMOVAL HEAL SPUR/ BUNIONECTOMY AND HAMMERTOE REPAIR    CARDIOVERSION N/A 07/22/2015   Procedure: CARDIOVERSION;  Surgeon: KDorothy Spark MD;  [place]  Service: Cardiovascular;  Laterality: N/A;   CARDIOVERSION N/A 10/06/2015   Procedure: CARDIOVERSION;  Surgeon: TSueanne Margarita MD;  [place]  Service: Cardiovascular;  Laterality: N/A;   CARDIOVERSION N/A 12/05/2016   Procedure: CARDIOVERSION;  Surgeon: HPixie Casino MD;  [facility];  Service: Cardiovascular;  Laterality: N/A;   CARDIOVERSION N/A 06/29/2018   Procedure: CARDIOVERSION;  Surgeon: NThayer Headings MD;  [place]  Service: Cardiovascular;  Laterality: N/A;   CARDIOVERSION N/A 09/20/2018   Procedure: CARDIOVERSION;  Surgeon: RSkeet Latch MD;  [place]  Service: Cardiovascular;  Laterality: N/A;   CIRCUMCISION N/A 11/27/2012   Procedure:  CIRCUMCISION  AND EXCISION OF GLANS PENIS;  Surgeon: MFredricka Bonine MD;  Location: [place];  Service: Urology;  Laterality: N/A;   ELECTROPHYSIOLOGIC STUDY N/A 03/30/2015   Procedure: PVC Ablation;  Surgeon: [name], MD;  Location: [place] [facility];  Service: Cardiovascular;  Laterality: N/A;   HERNIA REPAIR     SQUAMOUS CELL CARCINOMA EXCISION     "right hand; left shoulder" (8/46/6599)   UMBILICAL HERNIA REPAIR  11-04-2005    There were no vitals filed for this visit.   Subjective Assessment - 01/22/21 1108     Subjective Pt states he is a little better and reports reduced soreness with ambulation.  "Walking in here felt  good".  Pt hasn't tried to walk 1 mile.  He used to walk 3 miles in the winter.  He reports having tightness in R thigh and states his leg gets tired quickly with ambulation.    Pt still has some pain with steps though reports reduced pain with steps.  Pt denies any adverse effects after prior Rx and denies pain currently.  Pt saw PA and PA wanted pt to continue with PT.  Pt states he will follow up with MD/PA in 1 month and they will order a MRI if not improving.    Pertinent History Cardiac hx of A-fib and PVCs.  3 cardiac ablations for atrial flutters and PVCs.  5 cardioversions with the last one being in 2020.  orthostatic hypotension.  Penile CA:  surgery and chemotherapy in 2014 with resultant surgeries on 2017 and 2020.  Pt states he has had no signs of CA for 2 years.  Pt goes every 6 months to check penile CA for the next 3 years.  Chronic renal disease.  DM.  OA. HTN.  Abdominal hernia repair.  L ankle surgery and bunionectomy with hammertoe reconstruction.    Patient Stated Goals find out what he can do in the gym, walk normally without pain, to be able to walk at least 1 mile without pain    Pain Score 0-No pain   1-2/10 worst               [facility] PT Assessment - 01/22/21 0001       Observation/Other Assessments   Lower Extremity Functional Scale  34/80      PROM   PROM Assessment Site Hip   AROM   Right/Left Hip Right    Right Hip Flexion 90    Left Hip Flexion 97      Strength   Strength Assessment Site Hip    Right/Left Hip Right    Right Hip Flexion 4+/5    Right Hip Extension 4-/5    Right Hip External Rotation  5/5    Right Hip ABduction 4+/5    Right/Left Knee Right    Right Knee Extension 5/5      Palpation   Palpation comment minimal tenderness to palpation in R central mid quad/rectus femoris      Special Tests   Other special tests Thomas Test:  negative bilat.  pt could feel stretch bilat which was symmetrical.  Ely's Test:  positive bilat       Ambulation/Gait   Gait Comments improved gait speed, improved limp and WB'ing thru R LE.  Pt has minimal limp favoring R LE.                           [facility] Adult PT Treatment/Exercise - 01/22/21 0001       Therapeutic  Activites    Therapeutic Activities Other Therapeutic Activities    Other Therapeutic Activities See flow sheet for step ups, hip hikes, and airex pad activities to improve functional mobility and stability with daily functional mobility.      Exercises   Exercises Knee/Hip   Reviewed response to prior Rx and HEP compliance.     Knee/Hip Exercises: Stretches   Other Knee/Hip Stretches supine quad stretch in modified thomas test position 3x30 sec   Pt reports no pain with stretch     Knee/Hip Exercises: Standing   Other Standing Knee Exercises Standing SLR 2x10 with YTB bilat.  standing hip abd with YTB on R 2x10 reps    Other Standing Knee Exercises step ups on 6 inch 2x10 reps, marching on airex with UE assist  3x10 reps.  weight shifts on airex pad x 15 reps each f/b and s/s.  Hip hiking x 15 and x10  reps on 4 inch step bilat                     PT Education - 01/22/21 1142     Education Details Educated pt concerning POC.  Educated pt concerning objective findings and compared to prior findings.    Person(s) Educated Patient    Methods Explanation    Comprehension Verbalized understanding              PT Short Term Goals - 01/18/21 1148       PT SHORT TERM GOAL #1   Title Pt will be independent and compliant with HEP for improved pain, flexibility, strength, and function.    Time 2    Period Weeks    Status Achieved    Target Date 01/06/21      PT SHORT TERM GOAL #2   Title Pt will demo reduced tenderness to palpation in anterior thigh for reduced inflammation and soreness and improved mobility.    Time 3    Period Weeks    Status Achieved    Target Date 01/13/21      PT SHORT TERM GOAL #3   Title Pt will report  reduced pain with ambulation and reduced reliance on walking stick.    Time 3    Period Weeks    Status Achieved    Target Date 01/13/21               PT Long Term Goals - 01/22/21 1136       PT LONG TERM GOAL #1   Title Pt will report he is ambulating his normal distance without his walking stick without significant pain or difficulty.    Time 4    Period Weeks    Status Partially Met    Target Date 02/19/21      PT LONG TERM GOAL #2   Title Pt will demo improved R hip and R knee extension strength to 5/5 MMT for improved tolerance with and performance of functional mobility and yard work.    Time 4    Period Weeks    Status Partially Met    Target Date 02/19/21      PT LONG TERM GOAL #3   Title Pt will be able to perform his normal standing activities without significant R LE pain.    Time 4    Period Weeks    Status On-going    Target Date 02/19/21      PT LONG TERM GOAL #4   Title pt will be  able to return to working out in gym without adverse effects.    Time 4    Period Weeks    Status On-going    Target Date 02/19/21                   Plan - 01/22/21 1116     Clinical Impression Statement Pt is progresing slowly but steadily in PT.  Pt saw PA and received an order to cont PT for R hip flexor pain.  Pt reports rectly improving with soreness/pain with ambulation and steps. Pt had improved but minimal tenderness with palpation of R central mid quad/rectus femoris.  He continues to have soreness with ambulation and pain with bending forward.  He has not tried to walk a mile but use to walk up to 3 miles.  PT is slowly progressing strengthening and WB'ing exercises and Pt is performing without adverse effects.  He demonstrates improved quality of gait including improved gait speed, limp, and WB'ing thru R LE.  Pt does continues to have a minimal limp favoring R LE.  Pt demonstrated improved hip flex, abd, ER and knee extension strength.  Pt has met all STG's  and is progressing toward LTG's.   Pt responded well to Rx having no increased pain after Rx though was fatigued. Pt should benefit from cont skilled PT services to address ongoing goals and to restore PLOF.    Comorbidities Cardiac hx of A-fib and PVCs:  3 cardiac ablations and 5 cardioversions with the last one being in 2020.  orthostatic hypotension.  Penile CA:  Pt has had surgeries and chemotherapy and states he has had no signs of CA for 2 years.  Chronic renal disease.  DM.  OA. HTN.  Other PSHx include Abdominal hernia repair and L ankle surgery and bunionectomy with hammertoe reconstruction.  Pt met STG#1 and partially met STG#3.  Pt should benefit from cont skilled PT services to address ongoing goals and restore PLOF.    PT Frequency 2x / week    PT Duration 4 weeks    PT Treatment/Interventions ADLs/Self Care Home Management;Gait training;Stair training;Functional mobility training;Therapeutic activities;Therapeutic exercise;Neuromuscular re-education;Manual techniques;Patient/family education;Passive range of motion    PT Next Visit Plan Avoid Modalties due to Hx of CA.  Pt continues to be followed for CA though he states he currently does not have CA.  Cont with progressing ther ex and [place] activities.  Cont with  stretching and neuro re-ed activities.  Cont with PT 2x/wk x 4 weeks.    PT Home Exercise Plan Access Code: [email or web address].    Consulted and Agree with Plan of Care Patient             Patient will benefit from skilled therapeutic intervention in order to improve the following deficits and impairments:  Difficulty walking, Decreased activity tolerance, Decreased mobility, Decreased strength, Decreased range of motion, Impaired flexibility, Pain, Hypomobility  Visit Diagnosis: Pain in right thigh  Muscle weakness (generalized)     Problem List Patient Active Problem List   Diagnosis Date Noted   Secondary hypercoagulable state ([place]) 06/11/2019   Typical atrial flutter  ([place]) 05/09/2018   BMI 40.0-44.9, adult ([place]) 05/09/2018   Asthma 01/29/2018   Chronic renal disease 01/29/2018   Gout 01/29/2018   Penile cancer ([place]) 01/29/2018   Persistent atrial fibrillation 11/25/2015   Current use of long term anticoagulation 11/25/2015   H/O type 2 diabetes mellitus 11/25/2015   H/O: HTN (hypertension) 11/25/2015   Balanitis 09/30/2015  Atrial fibrillation ([place]) 06/30/2015   PVC (premature ventricular contraction) 03/30/2015   Hyperlipidemia 06/25/2014   Abnormal EKG 12/25/2013   Obesity 12/25/2013   Essential hypertension, benign 12/25/2013   PVC's (premature ventricular contractions) 05/07/2013   Edema    Orthostatic hypotension    HTN (hypertension)     Selinda Michaels III PT, DPT 01/22/21 1:29 PM   [facility] [place], [place], [address] Phone: [phone]   Fax:  [phone]  Name: Mark Singh MRN: [id number] Date of Birth: 07-Nov-1945

## 2021-01-27 DIAGNOSIS — J45909 Unspecified asthma, uncomplicated: Secondary | ICD-10-CM | POA: Diagnosis not present

## 2021-01-27 DIAGNOSIS — I10 Essential (primary) hypertension: Secondary | ICD-10-CM | POA: Diagnosis not present

## 2021-01-27 DIAGNOSIS — G4734 Idiopathic sleep related nonobstructive alveolar hypoventilation: Secondary | ICD-10-CM | POA: Diagnosis not present

## 2021-01-27 DIAGNOSIS — G4733 Obstructive sleep apnea (adult) (pediatric): Secondary | ICD-10-CM | POA: Diagnosis not present

## 2021-02-02 ENCOUNTER — Ambulatory Visit (HOSPITAL_BASED_OUTPATIENT_CLINIC_OR_DEPARTMENT_OTHER): Payer: Medicare PPO | Attending: Surgical | Admitting: Physical Therapy

## 2021-02-02 ENCOUNTER — Encounter (HOSPITAL_BASED_OUTPATIENT_CLINIC_OR_DEPARTMENT_OTHER): Payer: Self-pay | Admitting: Physical Therapy

## 2021-02-02 ENCOUNTER — Other Ambulatory Visit: Payer: Self-pay

## 2021-02-02 DIAGNOSIS — M6281 Muscle weakness (generalized): Secondary | ICD-10-CM | POA: Insufficient documentation

## 2021-02-02 DIAGNOSIS — M79651 Pain in right thigh: Secondary | ICD-10-CM | POA: Diagnosis not present

## 2021-02-02 NOTE — Therapy (Signed)
[facility] [address] [place], [place], [address] Phone: [phone]   Fax:  [phone]  Physical Therapy Treatment  Patient Details  Name: Mark Singh MRN: [id number] Date of Birth: 1945-06-03 Referring Provider (PT): Grier Mitts PA-C   Encounter Date: 02/02/2021   PT End of Session - 02/02/21 1419     Visit Number 10    Number of Visits 17    Date for PT Re-Evaluation 02/20/21    Authorization Type [employer]    PT Start Time 2703    PT Stop Time 1431    PT Time Calculation (min) 42 min    Activity Tolerance Patient tolerated treatment well    Behavior During Therapy [facility] for tasks assessed/performed             Past Medical History:  Diagnosis Date   Arthritis    "hands, feet" (12/13/2017)   Asthma    Bradycardia    a. Repoted h/o HR in the 40s.   Edema    a. 2D echo 11/2013: EF 50-09%, LV diastolic function parameters were normal, severely dilated LA, PASP 36mmHg..   H/O: gout    right foot; on daily RX" (12/13/2017)   HTN (hypertension)    Hyperlipidemia    Myalgia and myositis, unspecified    Nephrotic syndrome    a. responder to steroid therapy by Dr. Lorrene Reid.    Obesity    Orthostatic hypotension    Penile cancer ([place])    "did circ"   Pulmonary hypertension ([place])    a. Mild pulm HTN felt 2/2 obesity.   PVC's (premature ventricular contractions)    a. Holter 05/2013 showing 18k PVCs (18% of the time).   Seasonal asthma    mild   Shoulder pain    Squamous carcinoma    "right hand; left shoulder" (12/13/2017)   Type 2 diabetes mellitus ([place])     Past Surgical History:  Procedure Laterality Date   A-FLUTTER ABLATION N/A 02/23/2018   Procedure: A-FLUTTER ABLATION;  Surgeon: Constance Haw, MD;  Location: [place] CV [facility];  Service: Cardiovascular;  Laterality: N/A;   ATRIAL FIBRILLATION ABLATION N/A 12/13/2017   Procedure: ATRIAL FIBRILLATION ABLATION;  Surgeon: Constance Haw, MD;  Location: [place] [facility];  Service: Cardiovascular;  Laterality: N/A;   BUNIONECTOMY WITH HAMMERTOE RECONSTRUCTION Left 1992  &  2008   REMOVAL HEAL SPUR/ BUNIONECTOMY AND HAMMERTOE REPAIR    CARDIOVERSION N/A 07/22/2015   Procedure: CARDIOVERSION;  Surgeon: Dorothy Spark, MD;  Location: [facility];  Service: Cardiovascular;  Laterality: N/A;   CARDIOVERSION N/A 10/06/2015   Procedure: CARDIOVERSION;  Surgeon: Sueanne Margarita, MD;  Location: [place];  Service: Cardiovascular;  Laterality: N/A;   CARDIOVERSION N/A 12/05/2016   Procedure: CARDIOVERSION;  Surgeon: Pixie Casino, MD;  Location: [facility];  Service: Cardiovascular;  Laterality: N/A;   CARDIOVERSION N/A 06/29/2018   Procedure: CARDIOVERSION;  Surgeon: Thayer Headings, MD;  Location: [place];  Service: Cardiovascular;  Laterality: N/A;   CARDIOVERSION N/A 09/20/2018   Procedure: CARDIOVERSION;  Surgeon: [name], MD;  Location: [place];  Service: Cardiovascular;  Laterality: N/A;   CIRCUMCISION N/A 11/27/2012   Procedure:  CIRCUMCISION  AND EXCISION OF GLANS PENIS;  Surgeon: Fredricka Bonine, MD;  Location: [facility];  Service: Urology;  Laterality: N/A;   ELECTROPHYSIOLOGIC STUDY N/A 03/30/2015   Procedure: PVC Ablation;  Surgeon: [name], MD;  Location: [place] [facility];  Service: Cardiovascular;  Laterality: N/A;   HERNIA REPAIR     SQUAMOUS CELL CARCINOMA EXCISION     "right hand; left shoulder" (12/13/2017)   UMBILICAL HERNIA REPAIR  11-04-2005    There were no vitals filed for this visit.   Subjective Assessment - 02/02/21 1352     Subjective Pt states "it's probably a little better".  Pt walked 2/10 of a mile and had fatigue but not sore or painful.  Pt reports reduced soreness with ambulatio.  He used to walk 3 miles in the winter.  He reports having tightness in R thigh and states his leg gets tired quickly with ambulation.    Pt still has  some pain with steps though reports reduced pain with steps.  He is riding his stationay bike at home for a few mins at a time without adverse effects.  Pt denies any adverse effects after prior Rx and denies pain currently.  Pt states he will follow up with MD/PA in 2 weeks and they will order a MRI if not improving.                               [facility] Adult PT Treatment/Exercise - 02/02/21 0001       Therapeutic Activites    Therapeutic Activities Other Therapeutic Activities    Other Therapeutic Activities See flow sheet for step ups, hip hikes, and airex pad activities to improve functional mobility and stability with daily functional mobility.      Exercises   Exercises Knee/Hip   Reviewed response to prior Rx, current function, pain level, and HEP compliance.  Educated pt concerning walking program including progression.     Knee/Hip Exercises: Stretches   Other Knee/Hip Stretches supine quad stretch in modified thomas test position 3x30 sec   Pt reports no pain with stretch   Other Knee/Hip Stretches prone manual quad stretch 3x30 sec bilat      Knee/Hip Exercises: Standing   Other Standing Knee Exercises Standing SLR 3x10 on R and 1x10 on L with RTB.  standing hip abd with RTB on R 2x10 reps    Other Standing Knee Exercises step ups on 6 inch 2x10 reps, marching on airex with UE assist  3x10 reps.    Hip hiking 2 x10  reps bilat  on 4 inch step with 1 UE assist      Knee/Hip Exercises: Supine   Other Supine Knee/Hip Exercises supine bridge x10 reps, supine bridge with clams with GTB 2 x 10 reps      Knee/Hip Exercises: Sidelying   Other Sidelying Knee/Hip Exercises Hip abd 2x10.  S/L clams with RTB 2x10 reps                     PT Education - 02/02/21 1421     Education Details Instructed pt in correct form with exercises.  instructed pt to cont with HEP.  Educated pt concerning walking program and apprporiate progression    Person(s) Educated  Patient    Methods Explanation;Demonstration    Comprehension Verbalized understanding;Returned demonstration              PT Short Term Goals - 01/18/21 1148       PT SHORT TERM GOAL #1   Title Pt will be independent and compliant with HEP for improved pain, flexibility, strength, and function.    Time 2    Period Weeks  Status Achieved    Target Date 01/06/21      PT SHORT TERM GOAL #2   Title Pt will demo reduced tenderness to palpation in anterior thigh for reduced inflammation and soreness and improved mobility.    Time 3    Period Weeks    Status Achieved    Target Date 01/13/21      PT SHORT TERM GOAL #3   Title Pt will report reduced pain with ambulation and reduced reliance on walking stick.    Time 3    Period Weeks    Status Achieved    Target Date 01/13/21               PT Long Term Goals - 01/22/21 1136       PT LONG TERM GOAL #1   Title Pt will report he is ambulating his normal distance without his walking stick without significant pain or difficulty.    Time 4    Period Weeks    Status Partially Met    Target Date 02/19/21      PT LONG TERM GOAL #2   Title Pt will demo improved R hip and R knee extension strength to 5/5 MMT for improved tolerance with and performance of functional mobility and yard work.    Time 4    Period Weeks    Status Partially Met    Target Date 02/19/21      PT LONG TERM GOAL #3   Title Pt will be able to perform his normal standing activities without significant R LE pain.    Time 4    Period Weeks    Status On-going    Target Date 02/19/21      PT LONG TERM GOAL #4   Title pt will be able to return to working out in gym without adverse effects.    Time 4    Period Weeks    Status On-going    Target Date 02/19/21                   Plan - 02/02/21 2037     Clinical Impression Statement Pt presents to Rx reporting some/minimal improvement with ambulation.  He is ambulating more with less  soreness though reports fatigue.  Progressed intensity of exercises including increasing resistance with standing hip resisted exercises and S/L clams, performing bridge with clamshell, and only using 1 UE assistance with hip hikes.  Pt performed exercises well though was fatitgued with exercises.  He responded well to Rx reporting no pain and having no c/o's after Rx.  Pt should cont to benefit from cont skilled PT services to address ongoing goals and to restore PLOF.    Comorbidities Cardiac hx of A-fib and PVCs:  3 cardiac ablations and 5 cardioversions with the last one being in 2020.  orthostatic hypotension.  Penile CA:  Pt has had surgeries and chemotherapy and states he has had no signs of CA for 2 years.  Chronic renal disease.  DM.  OA. HTN.  Other PSHx include Abdominal hernia repair and L ankle surgery and bunionectomy with hammertoe reconstruction.    PT Treatment/Interventions ADLs/Self Care Home Management;Gait training;Stair training;Functional mobility training;Therapeutic activities;Therapeutic exercise;Neuromuscular re-education;Manual techniques;Patient/family education;Passive range of motion    PT Next Visit Plan Avoid Modalties due to Hx of CA.  Pt continues to be followed for CA though he states he currently does not have CA.  Cont with progressing ther ex and [place] activities.  Cont  with  stretching and neuro re-ed activities.    PT Home Exercise Plan Access Code: [email or web address].    Consulted and Agree with Plan of Care Patient             Patient will benefit from skilled therapeutic intervention in order to improve the following deficits and impairments:  Difficulty walking, Decreased activity tolerance, Decreased mobility, Decreased strength, Decreased range of motion, Impaired flexibility, Pain, Hypomobility  Visit Diagnosis: Pain in right thigh  Muscle weakness (generalized)     Problem List Patient Active Problem List   Diagnosis Date Noted   Secondary  hypercoagulable state ([place]) 06/11/2019   Typical atrial flutter ([place]) 05/09/2018   BMI 40.0-44.9, adult ([place]) 05/09/2018   Asthma 01/29/2018   Chronic renal disease 01/29/2018   Gout 01/29/2018   Penile cancer ([place]) 01/29/2018   Persistent atrial fibrillation 11/25/2015   Current use of long term anticoagulation 11/25/2015   H/O type 2 diabetes mellitus 11/25/2015   H/O: HTN (hypertension) 11/25/2015   Balanitis 09/30/2015   Atrial fibrillation ([place]) 06/30/2015   PVC (premature ventricular contraction) 03/30/2015   Hyperlipidemia 06/25/2014   Abnormal EKG 12/25/2013   Obesity 12/25/2013   Essential hypertension, benign 12/25/2013   PVC's (premature ventricular contractions) 05/07/2013   Edema    Orthostatic hypotension    HTN (hypertension)     Selinda Michaels III PT, DPT 02/02/21 8:47 PM   [facility] [place] [place], [place], [address] Phone: [phone]   Fax:  [phone]  Name: Mark Singh MRN: [id number] Date of Birth: Jul 07, 1945

## 2021-02-05 ENCOUNTER — Ambulatory Visit (HOSPITAL_BASED_OUTPATIENT_CLINIC_OR_DEPARTMENT_OTHER): Payer: Medicare PPO | Admitting: Physical Therapy

## 2021-02-05 ENCOUNTER — Other Ambulatory Visit: Payer: Self-pay

## 2021-02-05 ENCOUNTER — Encounter (HOSPITAL_BASED_OUTPATIENT_CLINIC_OR_DEPARTMENT_OTHER): Payer: Self-pay | Admitting: Physical Therapy

## 2021-02-05 DIAGNOSIS — M6281 Muscle weakness (generalized): Secondary | ICD-10-CM | POA: Diagnosis not present

## 2021-02-05 DIAGNOSIS — M79651 Pain in right thigh: Secondary | ICD-10-CM | POA: Diagnosis not present

## 2021-02-05 NOTE — Therapy (Signed)
[facility] [address] [place], [place], [address] Phone: [phone]   Fax:  [phone]  Physical Therapy Treatment  Patient Details  Name: Mark Singh MRN: [id number] Date of Birth: Mar 13, 1946 Referring Provider (PT): Grier Mitts PA-C   Encounter Date: 02/05/2021   PT End of Session - 02/05/21 1157     Visit Number 11    Number of Visits 17    Date for PT Re-Evaluation 02/20/21    Authorization Type [employer]    PT Start Time 1114    PT Stop Time 1157    PT Time Calculation (min) 43 min    Activity Tolerance Patient tolerated treatment well    Behavior During Therapy [facility] for tasks assessed/performed             Past Medical History:  Diagnosis Date   Arthritis    "hands, feet" (12/13/2017)   Asthma    Bradycardia    a. Repoted h/o HR in the 40s.   Edema    a. 2D echo 11/2013: EF 65-53%, LV diastolic function parameters were normal, severely dilated LA, PASP 39mmHg..   H/O: gout    right foot; on daily RX" (12/13/2017)   HTN (hypertension)    Hyperlipidemia    Myalgia and myositis, unspecified    Nephrotic syndrome    a. responder to steroid therapy by Dr. Lorrene Reid.    Obesity    Orthostatic hypotension    Penile cancer ([place])    "did circ"   Pulmonary hypertension ([place])    a. Mild pulm HTN felt 2/2 obesity.   PVC's (premature ventricular contractions)    a. Holter 05/2013 showing 18k PVCs (18% of the time).   Seasonal asthma    mild   Shoulder pain    Squamous carcinoma    "right hand; left shoulder" (12/13/2017)   Type 2 diabetes mellitus ([place])     Past Surgical History:  Procedure Laterality Date   A-FLUTTER ABLATION N/A 02/23/2018   Procedure: A-FLUTTER ABLATION;  Surgeon: Constance Haw, MD;  Location: [place] [facility];  Service: Cardiovascular;  Laterality: N/A;   ATRIAL FIBRILLATION ABLATION N/A 12/13/2017   Procedure: ATRIAL FIBRILLATION ABLATION;  Surgeon: Constance Haw, MD;  Location: [place] [facility];  Service: Cardiovascular;  Laterality: N/A;   BUNIONECTOMY WITH HAMMERTOE RECONSTRUCTION Left 1992  &  2008   REMOVAL HEAL SPUR/ BUNIONECTOMY AND HAMMERTOE REPAIR    CARDIOVERSION N/A 07/22/2015   Procedure: CARDIOVERSION;  Surgeon: Dorothy Spark, MD;  Location: [place];  Service: Cardiovascular;  Laterality: N/A;   CARDIOVERSION N/A 10/06/2015   Procedure: CARDIOVERSION;  Surgeon: Sueanne Margarita, MD;  Location: [place];  Service: Cardiovascular;  Laterality: N/A;   CARDIOVERSION N/A 12/05/2016   Procedure: CARDIOVERSION;  Surgeon: Pixie Casino, MD;  Location: [facility] ENDOSCOPY;  Service: Cardiovascular;  Laterality: N/A;   CARDIOVERSION N/A 06/29/2018   Procedure: CARDIOVERSION;  Surgeon: Thayer Headings, MD;  Location: [place];  Service: Cardiovascular;  Laterality: N/A;   CARDIOVERSION N/A 09/20/2018   Procedure: CARDIOVERSION;  Surgeon: [name], MD;  Location: [place];  Service: Cardiovascular;  Laterality: N/A;   CIRCUMCISION N/A 11/27/2012   Procedure:  CIRCUMCISION  AND EXCISION OF GLANS PENIS;  Surgeon: Fredricka Bonine, MD;  Location: [facility];  Service: Urology;  Laterality: N/A;   ELECTROPHYSIOLOGIC STUDY N/A 03/30/2015   Procedure: PVC Ablation;  Surgeon: [name], MD;  Location: [place] CV [facility];  Service: Cardiovascular;  Laterality: N/A;   HERNIA REPAIR     SQUAMOUS CELL CARCINOMA EXCISION     "right hand; left shoulder" (1/49/7026)   UMBILICAL HERNIA REPAIR  11-04-2005    There were no vitals filed for this visit.   Subjective Assessment - 02/05/21 1130     Subjective Pt denies pain and denies any adverse effects after prior Rx.  Pt reports he walked 4/10 of a mile Wednesday AM and 3/10 of a mile Wednesday afternoon at the park.  He walked 3/10 mile again yesterday.  Pt states he was a little sore with walking though had no pain.  Pt states it's getting better.     Pertinent History Cardiac hx of A-fib and PVCs.  3 cardiac ablations for atrial flutters and PVCs.  5 cardioversions with the last one being in 2020.  orthostatic hypotension.  Penile CA:  surgery and chemotherapy in 2014 with resultant surgeries on 2017 and 2020.  Pt states he has had no signs of CA for 2 years.  Pt goes every 6 months to check penile CA for the next 3 years.  Chronic renal disease.  DM.  OA. HTN.  Abdominal hernia repair.  L ankle surgery and bunionectomy with hammertoe reconstruction.    Currently in Pain? No/denies                               [facility] Adult PT Treatment/Exercise - 02/05/21 0001       Therapeutic Activites    Therapeutic Activities Other Therapeutic Activities    Other Therapeutic Activities See flow sheet for step ups, hip hikes, and airex pad activities to improve functional mobility and stability with daily functional mobility.      Exercises   Exercises Knee/Hip   Reviewed response to prior Rx, current function, pain level, and HEP compliance.  Educated pt concerning walking program including progression.     Knee/Hip Exercises: Stretches   Other Knee/Hip Stretches S/L hip flexor stretch 3x30 sec on R.  prone manual quad stretch 3x30 sec bilat      Knee/Hip Exercises: Standing   Other Standing Knee Exercises Standing SLR 1x10 on R and 1x10 on L with RTB.  standing hip abd with RTB  x 10 reps bilat    Other Standing Knee Exercises step ups on 6 inch 2x10 reps, marching on airex with UE assist  3x10 reps.    Hip hiking 2 x10  reps bilat  on 4 inch step with 1 UE assist      Knee/Hip Exercises: Supine   Other Supine Knee/Hip Exercises supine bridge with clams with GTB 2 x 10 reps.  Supine SLR 2x10 reps      Knee/Hip Exercises: Sidelying   Other Sidelying Knee/Hip Exercises Hip abd 2x10.  S/L clams with RTB 2x10 reps                     PT Education - 02/05/21 1204     Education Details Instructed pt in correct form  with exercises.  Educated pt concerning walking program and apprporiate progression    Person(s) Educated Patient    Methods Explanation;Demonstration;Verbal cues    Comprehension Returned demonstration;Verbalized understanding              PT Short Term Goals - 01/18/21 1148       PT SHORT TERM GOAL #1   Title Pt will be independent and  compliant with HEP for improved pain, flexibility, strength, and function.    Time 2    Period Weeks    Status Achieved    Target Date 01/06/21      PT SHORT TERM GOAL #2   Title Pt will demo reduced tenderness to palpation in anterior thigh for reduced inflammation and soreness and improved mobility.    Time 3    Period Weeks    Status Achieved    Target Date 01/13/21      PT SHORT TERM GOAL #3   Title Pt will report reduced pain with ambulation and reduced reliance on walking stick.    Time 3    Period Weeks    Status Achieved    Target Date 01/13/21               PT Long Term Goals - 01/22/21 1136       PT LONG TERM GOAL #1   Title Pt will report he is ambulating his normal distance without his walking stick without significant pain or difficulty.    Time 4    Period Weeks    Status Partially Met    Target Date 02/19/21      PT LONG TERM GOAL #2   Title Pt will demo improved R hip and R knee extension strength to 5/5 MMT for improved tolerance with and performance of functional mobility and yard work.    Time 4    Period Weeks    Status Partially Met    Target Date 02/19/21      PT LONG TERM GOAL #3   Title Pt will be able to perform his normal standing activities without significant R LE pain.    Time 4    Period Weeks    Status On-going    Target Date 02/19/21      PT LONG TERM GOAL #4   Title pt will be able to return to working out in gym without adverse effects.    Time 4    Period Weeks    Status On-going    Target Date 02/19/21                   Plan - 02/05/21 1205     Clinical  Impression Statement Pt is making progress with tolerance to activity, ambulation, and strength.  He increased his walking at the park without adverse effects.  Pt was able to perform supine SLR without increased pain.  He is improving with strength in R LE as evidenced by performance of exercises.  Pt responded well to Rx having no pain after Rx though was fatigued.    Comorbidities Cardiac hx of A-fib and PVCs:  3 cardiac ablations and 5 cardioversions with the last one being in 2020.  orthostatic hypotension.  Penile CA:  Pt has had surgeries and chemotherapy and states he has had no signs of CA for 2 years.  Chronic renal disease.  DM.  OA. HTN.  Other PSHx include Abdominal hernia repair and L ankle surgery and bunionectomy with hammertoe reconstruction.    PT Treatment/Interventions ADLs/Self Care Home Management;Gait training;Stair training;Functional mobility training;Therapeutic activities;Therapeutic exercise;Neuromuscular re-education;Manual techniques;Patient/family education;Passive range of motion    PT Next Visit Plan Avoid Modalties due to Hx of CA.  Pt continues to be followed for CA though he states he currently does not have CA.  Cont with stretching, and progressing strength and stability.  PN next visit due to 12th visit.  PT Home Exercise Plan Access Code: [email or web address].    Consulted and Agree with Plan of Care Patient             Patient will benefit from skilled therapeutic intervention in order to improve the following deficits and impairments:  Difficulty walking, Decreased activity tolerance, Decreased mobility, Decreased strength, Decreased range of motion, Impaired flexibility, Pain, Hypomobility  Visit Diagnosis: Pain in right thigh  Muscle weakness (generalized)     Problem List Patient Active Problem List   Diagnosis Date Noted   Secondary hypercoagulable state ([place]) 06/11/2019   Typical atrial flutter ([place]) 05/09/2018   BMI 40.0-44.9, adult ([place]) 05/09/2018    Asthma 01/29/2018   Chronic renal disease 01/29/2018   Gout 01/29/2018   Penile cancer ([place]) 01/29/2018   Persistent atrial fibrillation 11/25/2015   Current use of long term anticoagulation 11/25/2015   H/O type 2 diabetes mellitus 11/25/2015   H/O: HTN (hypertension) 11/25/2015   Balanitis 09/30/2015   Atrial fibrillation ([place]) 06/30/2015   PVC (premature ventricular contraction) 03/30/2015   Hyperlipidemia 06/25/2014   Abnormal EKG 12/25/2013   Obesity 12/25/2013   Essential hypertension, benign 12/25/2013   PVC's (premature ventricular contractions) 05/07/2013   Edema    Orthostatic hypotension    HTN (hypertension)     Selinda Michaels III PT, DPT 02/05/21 12:11 PM   [place], [place], [address] Phone: [phone]   Fax:  [phone]  Name: AVISH TORRY MRN: [id number] Date of Birth: [birth year]

## 2021-02-08 ENCOUNTER — Ambulatory Visit (HOSPITAL_BASED_OUTPATIENT_CLINIC_OR_DEPARTMENT_OTHER): Payer: Medicare PPO | Admitting: Physical Therapy

## 2021-02-08 ENCOUNTER — Other Ambulatory Visit: Payer: Self-pay

## 2021-02-08 ENCOUNTER — Encounter (HOSPITAL_BASED_OUTPATIENT_CLINIC_OR_DEPARTMENT_OTHER): Payer: Self-pay | Admitting: Physical Therapy

## 2021-02-08 DIAGNOSIS — M6281 Muscle weakness (generalized): Secondary | ICD-10-CM | POA: Diagnosis not present

## 2021-02-08 DIAGNOSIS — M79651 Pain in right thigh: Secondary | ICD-10-CM

## 2021-02-08 NOTE — Therapy (Signed)
[facility] [address] [place], [place], [address] Phone: [phone]   Fax:  [phone]  Physical Therapy Treatment  Patient Details  Name: Mark Singh MRN: [id number] Date of Birth: 1945/10/30 Referring Provider (PT): Grier Mitts PA-C   Encounter Date: 02/08/2021   PT End of Session - 02/08/21 1349     Visit Number 12    Number of Visits 17    Date for PT Re-Evaluation 02/20/21    Authorization Type [employer]    Progress Note Due on Visit --   02/20/21'   PT Start Time 1308    PT Stop Time 1346    PT Time Calculation (min) 38 min    Activity Tolerance Patient tolerated treatment well    Behavior During Therapy [facility] for tasks assessed/performed             Past Medical History:  Diagnosis Date   Arthritis    "hands, feet" (12/13/2017)   Asthma    Bradycardia    a. Repoted h/o HR in the 40s.   Edema    a. 2D echo 11/2013: EF 31-54%, LV diastolic function parameters were normal, severely dilated LA, PASP 48mHg..   H/O: gout    right foot; on daily RX" (12/13/2017)   HTN (hypertension)    Hyperlipidemia    Myalgia and myositis, unspecified    Nephrotic syndrome    a. responder to steroid therapy by Dr. DLorrene Reid    Obesity    Orthostatic hypotension    Penile cancer ([place]    "did circ"   Pulmonary hypertension ([place]    a. Mild pulm HTN felt 2/2 obesity.   PVC's (premature ventricular contractions)    a. Holter 05/2013 showing 18k PVCs (18% of the time).   Seasonal asthma    mild   Shoulder pain    Squamous carcinoma    "right hand; left shoulder" (12/13/2017)   Type 2 diabetes mellitus ([place]     Past Surgical History:  Procedure Laterality Date   A-FLUTTER ABLATION N/A 02/23/2018   Procedure: A-FLUTTER ABLATION;  Surgeon: CConstance Haw MD;  [facility];  Service: Cardiovascular;  Laterality: N/A;   ATRIAL FIBRILLATION ABLATION N/A 12/13/2017   Procedure: ATRIAL  FIBRILLATION ABLATION;  Surgeon: CConstance Haw MD;  [place] [facility];  Service: Cardiovascular;  Laterality: N/A;   BUNIONECTOMY WITH HAMMERTOE RECONSTRUCTION Left 1992  &  2008   REMOVAL HEAL SPUR/ BUNIONECTOMY AND HAMMERTOE REPAIR    CARDIOVERSION N/A 07/22/2015   Procedure: CARDIOVERSION;  Surgeon: KDorothy Spark MD;  [place] [facility]  Service: Cardiovascular;  Laterality: N/A;   CARDIOVERSION N/A 10/06/2015   Procedure: CARDIOVERSION;  Surgeon: TSueanne Margarita MD;  [place]  Service: Cardiovascular;  Laterality: N/A;   CARDIOVERSION N/A 12/05/2016   Procedure: CARDIOVERSION;  Surgeon: HPixie Casino MD;  [facility];  Service: Cardiovascular;  Laterality: N/A;   CARDIOVERSION N/A 06/29/2018   Procedure: CARDIOVERSION;  Surgeon: NThayer Headings MD;  [place] [facility]  Service: Cardiovascular;  Laterality: N/A;   CARDIOVERSION N/A 09/20/2018   Procedure: CARDIOVERSION;  Surgeon: RSkeet Latch MD;  [place]  Service: Cardiovascular;  Laterality: N/A;   CIRCUMCISION N/A 11/27/2012   Procedure:  CIRCUMCISION  AND EXCISION OF GLANS PENIS;  Surgeon: MFredricka Bonine MD;  [facility]  Service: Urology;  Laterality: N/A;   ELECTROPHYSIOLOGIC STUDY N/A 03/30/2015   Procedure: PVC Ablation;  Surgeon: [name], MD;  Location: [place] [facility];  Service: Cardiovascular;  Laterality: N/A;   HERNIA REPAIR     SQUAMOUS CELL CARCINOMA EXCISION     "right hand; left shoulder" (9/89/2119)   UMBILICAL HERNIA REPAIR  11-04-2005    There were no vitals filed for this visit.   Subjective Assessment - 02/08/21 1310     Subjective Pt walked 1/2 mile, rested 5 mins, and then walked 1/2 mile on Saturday.  He reports no pain during or afterwards though did have some soreness the following day.  He states the soreness wasn't bad though.  Pt ambulated 3/10 of a mile this AM without pain.   Pt denies any adverse effects after prior Rx.  Pt reports compliance with HEP.  Pt states he has improved overall.    Pertinent History Cardiac hx of A-fib and PVCs.  3 cardiac ablations for atrial flutters and PVCs.  5 cardioversions with the last one being in 2020.  orthostatic hypotension.  Penile CA:  surgery and chemotherapy in 2014 with resultant surgeries on 2017 and 2020.  Pt states he has had no signs of CA for 2 years.  Pt goes every 6 months to check penile CA for the next 3 years.  Chronic renal disease.  DM.  OA. HTN.  Abdominal hernia repair.  L ankle surgery and bunionectomy with hammertoe reconstruction.    Currently in Pain? No/denies                               [facility] Adult PT Treatment/Exercise - 02/08/21 0001       Therapeutic Activites    Therapeutic Activities Other Therapeutic Activities    Other Therapeutic Activities See flow sheet for step ups, hip hikes, and airex pad activities to improve functional mobility and stability with daily functional mobility.      Exercises   Exercises Knee/Hip   Reviewed response to prior Rx, current function, pain level, and HEP compliance.  Educated pt concerning walking program including progression.     Knee/Hip Exercises: Stretches   Other Knee/Hip Stretches S/L hip flexor stretch 3x30 sec on R.  prone manual quad stretch 3x30 sec bilat      Knee/Hip Exercises: Standing   Other Standing Knee Exercises Standing SLR 1x10 on R and 1x10 on L with RTB.  standing hip abd with RTB  x 10 reps bilat    Other Standing Knee Exercises step ups on 6 inch 2x10 reps, marching on airex with UE assist  2x15 reps with light UE assist.  Hip hiking 2 x10  reps bilat  on 4 inch step with 1 UE assist      Knee/Hip Exercises: Supine   Other Supine Knee/Hip Exercises supine bridge with clams with GTB 2 x 10 reps.  Supine SLR 2x10 reps      Knee/Hip Exercises: Sidelying   Other Sidelying Knee/Hip Exercises Hip abd 2x10.  S/L clams  with RTB 2x10 reps                     PT Education - 02/08/21 2036     Education Details Instructed pt in correct form with exercises.  Educated pt concerningg walking program, appropriate sx response, and appropriate progression    Person(s) Educated Patient    Methods Explanation;Demonstration;Verbal cues    Comprehension Verbalized understanding;Returned demonstration  PT Short Term Goals - 01/18/21 1148       PT SHORT TERM GOAL #1   Title Pt will be independent and compliant with HEP for improved pain, flexibility, strength, and function.    Time 2    Period Weeks    Status Achieved    Target Date 01/06/21      PT SHORT TERM GOAL #2   Title Pt will demo reduced tenderness to palpation in anterior thigh for reduced inflammation and soreness and improved mobility.    Time 3    Period Weeks    Status Achieved    Target Date 01/13/21      PT SHORT TERM GOAL #3   Title Pt will report reduced pain with ambulation and reduced reliance on walking stick.    Time 3    Period Weeks    Status Achieved    Target Date 01/13/21               PT Long Term Goals - 01/22/21 1136       PT LONG TERM GOAL #1   Title Pt will report he is ambulating his normal distance without his walking stick without significant pain or difficulty.    Time 4    Period Weeks    Status Partially Met    Target Date 02/19/21      PT LONG TERM GOAL #2   Title Pt will demo improved R hip and R knee extension strength to 5/5 MMT for improved tolerance with and performance of functional mobility and yard work.    Time 4    Period Weeks    Status Partially Met    Target Date 02/19/21      PT LONG TERM GOAL #3   Title Pt will be able to perform his normal standing activities without significant R LE pain.    Time 4    Period Weeks    Status On-going    Target Date 02/19/21      PT LONG TERM GOAL #4   Title pt will be able to return to working out in gym without  adverse effects.    Time 4    Period Weeks    Status On-going    Target Date 02/19/21                   Plan - 02/08/21 1347     Clinical Impression Statement Pt is progressing well with pain, sx's, strength, and ambulatio.  Pt is increasing ambulation distance with walking program without adverse effects.  Pt demonstrates improved strength as evidenced by performance of supine SLR without c/o's of pain.  Pt had pain in R hip with L standing hip abduction, but demonstrates improved tolerance with standing exercises and exercises in general.  He responded well to Rx reporting no pain, just fatigue after Rx.  pt should cont to benefit from cont skilled PT services to address ongoing goals and to restore PLOF.    Comorbidities Cardiac hx of A-fib and PVCs:  3 cardiac ablations and 5 cardioversions with the last one being in 2020.  orthostatic hypotension.  Penile CA:  Pt has had surgeries and chemotherapy and states he has had no signs of CA for 2 years.  Chronic renal disease.  DM.  OA. HTN.  Other PSHx include Abdominal hernia repair and L ankle surgery and bunionectomy with hammertoe reconstruction.    PT Treatment/Interventions ADLs/Self Care Home Management;Gait training;Stair training;Functional mobility training;Therapeutic activities;Therapeutic  exercise;Neuromuscular re-education;Manual techniques;Patient/family education;Passive range of motion    PT Next Visit Plan Avoid Modalties due to Hx of CA.  Pt continues to be followed for CA though he states he currently does not have CA.  Cont with stretching, and progressing strength and stability.  Will send prior recert in for furter auth.    PT Home Exercise Plan Access Code: [email or web address].    Consulted and Agree with Plan of Care Patient             Patient will benefit from skilled therapeutic intervention in order to improve the following deficits and impairments:  Difficulty walking, Decreased activity tolerance, Decreased  mobility, Decreased strength, Decreased range of motion, Impaired flexibility, Pain, Hypomobility  Visit Diagnosis: Pain in right thigh  Muscle weakness (generalized)     Problem List Patient Active Problem List   Diagnosis Date Noted   Secondary hypercoagulable state ([place]) 06/11/2019   Typical atrial flutter ([place]) 05/09/2018   BMI 40.0-44.9, adult ([place]) 05/09/2018   Asthma 01/29/2018   Chronic renal disease 01/29/2018   Gout 01/29/2018   Penile cancer ([place]) 01/29/2018   Persistent atrial fibrillation 11/25/2015   Current use of long term anticoagulation 11/25/2015   H/O type 2 diabetes mellitus 11/25/2015   H/O: HTN (hypertension) 11/25/2015   Balanitis 09/30/2015   Atrial fibrillation ([place]) 06/30/2015   PVC (premature ventricular contraction) 03/30/2015   Hyperlipidemia 06/25/2014   Abnormal EKG 12/25/2013   Obesity 12/25/2013   Essential hypertension, benign 12/25/2013   PVC's (premature ventricular contractions) 05/07/2013   Edema    Orthostatic hypotension    HTN (hypertension)     Selinda Michaels III PT, DPT 02/08/21 8:43 PM   [facility] [place], [place], [address] Phone: [phone]   Fax:  [phone]  Name: Mark Singh MRN: [id number] Date of Birth: July 29, 1945

## 2021-02-09 ENCOUNTER — Encounter (HOSPITAL_BASED_OUTPATIENT_CLINIC_OR_DEPARTMENT_OTHER): Payer: Medicare PPO | Admitting: Physical Therapy

## 2021-02-12 ENCOUNTER — Encounter (HOSPITAL_BASED_OUTPATIENT_CLINIC_OR_DEPARTMENT_OTHER): Payer: Self-pay | Admitting: Physical Therapy

## 2021-02-12 ENCOUNTER — Ambulatory Visit (HOSPITAL_BASED_OUTPATIENT_CLINIC_OR_DEPARTMENT_OTHER): Payer: Medicare PPO | Admitting: Physical Therapy

## 2021-02-12 ENCOUNTER — Other Ambulatory Visit: Payer: Self-pay

## 2021-02-12 DIAGNOSIS — M79651 Pain in right thigh: Secondary | ICD-10-CM

## 2021-02-12 DIAGNOSIS — M6281 Muscle weakness (generalized): Secondary | ICD-10-CM

## 2021-02-12 NOTE — Therapy (Signed)
[facility] [address] [place], [place], [address] Phone: [phone]   Fax:  [phone]  Physical Therapy Treatment  Patient Details  Name: Mark Singh MRN: [id number] Date of Birth: 1945/09/30 Referring Provider (PT): Grier Mitts PA-C   Encounter Date: 02/12/2021   PT End of Session - 02/12/21 1111     Visit Number 13    Number of Visits 17    Date for PT Re-Evaluation 02/20/21    Authorization Type [employer]    PT Start Time 1108    PT Stop Time 1149    PT Time Calculation (min) 41 min    Activity Tolerance Patient tolerated treatment well    Behavior During Therapy [facility] for tasks assessed/performed             Past Medical History:  Diagnosis Date   Arthritis    "hands, feet" (12/13/2017)   Asthma    Bradycardia    a. Repoted h/o HR in the 40s.   Edema    a. 2D echo 11/2013: EF 42-70%, LV diastolic function parameters were normal, severely dilated LA, PASP 42mmHg..   H/O: gout    right foot; on daily RX" (12/13/2017)   HTN (hypertension)    Hyperlipidemia    Myalgia and myositis, unspecified    Nephrotic syndrome    a. responder to steroid therapy by Dr. Lorrene Reid.    Obesity    Orthostatic hypotension    Penile cancer ([place])    "did circ"   Pulmonary hypertension ([place])    a. Mild pulm HTN felt 2/2 obesity.   PVC's (premature ventricular contractions)    a. Holter 05/2013 showing 18k PVCs (18% of the time).   Seasonal asthma    mild   Shoulder pain    Squamous carcinoma    "right hand; left shoulder" (12/13/2017)   Type 2 diabetes mellitus ([place])     Past Surgical History:  Procedure Laterality Date   A-FLUTTER ABLATION N/A 02/23/2018   Procedure: A-FLUTTER ABLATION;  Surgeon: Constance Haw, MD;  Location: [place] CV LAB;  Service: Cardiovascular;  Laterality: N/A;   ATRIAL FIBRILLATION ABLATION N/A 12/13/2017   Procedure: ATRIAL FIBRILLATION ABLATION;  Surgeon: Constance Haw, MD;  Location: [place] [facility] LAB;  Service: Cardiovascular;  Laterality: N/A;   BUNIONECTOMY WITH HAMMERTOE RECONSTRUCTION Left 1992  &  2008   REMOVAL HEAL SPUR/ BUNIONECTOMY AND HAMMERTOE REPAIR    CARDIOVERSION N/A 07/22/2015   Procedure: CARDIOVERSION;  Surgeon: Dorothy Spark, MD;  Location: [facility];  Service: Cardiovascular;  Laterality: N/A;   CARDIOVERSION N/A 10/06/2015   Procedure: CARDIOVERSION;  Surgeon: Sueanne Margarita, MD;  Location: [place];  Service: Cardiovascular;  Laterality: N/A;   CARDIOVERSION N/A 12/05/2016   Procedure: CARDIOVERSION;  Surgeon: Pixie Casino, MD;  Location: [facility];  Service: Cardiovascular;  Laterality: N/A;   CARDIOVERSION N/A 06/29/2018   Procedure: CARDIOVERSION;  Surgeon: Thayer Headings, MD;  Location: [place];  Service: Cardiovascular;  Laterality: N/A;   CARDIOVERSION N/A 09/20/2018   Procedure: CARDIOVERSION;  Surgeon: [name], MD;  Location: [place];  Service: Cardiovascular;  Laterality: N/A;   CIRCUMCISION N/A 11/27/2012   Procedure:  CIRCUMCISION  AND EXCISION OF GLANS PENIS;  Surgeon: Fredricka Bonine, MD;  Location: [facility];  Service: Urology;  Laterality: N/A;   ELECTROPHYSIOLOGIC STUDY N/A 03/30/2015   Procedure: PVC Ablation;  Surgeon: [name], MD;  Location: [place] CV LAB;  Service: Cardiovascular;  Laterality: N/A;   HERNIA REPAIR     SQUAMOUS CELL CARCINOMA EXCISION     "right hand; left shoulder" (9/74/1638)   UMBILICAL HERNIA REPAIR  11-04-2005    There were no vitals filed for this visit.   Subjective Assessment - 02/12/21 1113     Subjective Pt states he thinks he walked too much because his leg is sore with walking.  he walked 1 mile and 1/10th with 3 rest breaks 2 days ago.  He didn't walk yesterday though could feel it in his leg and he has increased soreness today.  Pt denies pain.  He denies any adverse effects after prior Rx.     Pertinent History Cardiac hx of A-fib and PVCs.  3 cardiac ablations for atrial flutters and PVCs.  5 cardioversions with the last one being in 2020.  orthostatic hypotension.  Penile CA:  surgery and chemotherapy in 2014 with resultant surgeries on 2017 and 2020.  Pt states he has had no signs of CA for 2 years.  Pt goes every 6 months to check penile CA for the next 3 years.  Chronic renal disease.  DM.  OA. HTN.  Abdominal hernia repair.  L ankle surgery and bunionectomy with hammertoe reconstruction.    Currently in Pain? No/denies                               [facility] Adult PT Treatment/Exercise - 02/12/21 0001       Therapeutic Activites    Therapeutic Activities Other Therapeutic Activities    Other Therapeutic Activities See flow sheet for step ups, hip hikes, and sidestepping activities to improve functional mobility and stability with daily functional mobility.      Exercises   Exercises Knee/Hip   Reviewed response to prior Rx, current function, pain level, and HEP compliance.  Educated pt concerning walking program including progression.     Knee/Hip Exercises: Stretches   Other Knee/Hip Stretches supine quad stretch in modified thomas test position 2x30 sec   Pt reports no pain with stretch   Other Knee/Hip Stretches S/L hip flexor stretch 3x30 sec on R.  prone manual quad stretch 3x30 sec bilat      Knee/Hip Exercises: Aerobic   Nustep 4 mins at L4 with LEs      Knee/Hip Exercises: Standing   Other Standing Knee Exercises Standing SLR 1x10 on R and 1x10 on L with RTB.    Other Standing Knee Exercises step ups on 6 inch 2x10 reps, sidestepping without UE assist x 3 laps.  Hip hiking 2 x10  reps bilat  on 4 inch step with 1 UE assist      Knee/Hip Exercises: Supine   Other Supine Knee/Hip Exercises supine bridge with clams with GTB 2 x 10 reps.  Supine SLR 2x10 reps                     PT Education - 02/12/21 1252     Education Details  Educated pt concerning walking program and appropriate progression.  Exercise form.    Person(s) Educated Patient    Methods Explanation;Demonstration;Verbal cues    Comprehension Returned demonstration;Verbalized understanding              PT Short Term Goals - 01/18/21 1148       PT SHORT TERM GOAL #1   Title Pt will be independent and compliant with HEP for  improved pain, flexibility, strength, and function.    Time 2    Period Weeks    Status Achieved    Target Date 01/06/21      PT SHORT TERM GOAL #2   Title Pt will demo reduced tenderness to palpation in anterior thigh for reduced inflammation and soreness and improved mobility.    Time 3    Period Weeks    Status Achieved    Target Date 01/13/21      PT SHORT TERM GOAL #3   Title Pt will report reduced pain with ambulation and reduced reliance on walking stick.    Time 3    Period Weeks    Status Achieved    Target Date 01/13/21               PT Long Term Goals - 01/22/21 1136       PT LONG TERM GOAL #1   Title Pt will report he is ambulating his normal distance without his walking stick without significant pain or difficulty.    Time 4    Period Weeks    Status Partially Met    Target Date 02/19/21      PT LONG TERM GOAL #2   Title Pt will demo improved R hip and R knee extension strength to 5/5 MMT for improved tolerance with and performance of functional mobility and yard work.    Time 4    Period Weeks    Status Partially Met    Target Date 02/19/21      PT LONG TERM GOAL #3   Title Pt will be able to perform his normal standing activities without significant R LE pain.    Time 4    Period Weeks    Status On-going    Target Date 02/19/21      PT LONG TERM GOAL #4   Title pt will be able to return to working out in gym without adverse effects.    Time 4    Period Weeks    Status On-going    Target Date 02/19/21                   Plan - 02/12/21 1252     Clinical  Impression Statement Pt has been increasing his walking program though states he may have done too much a couple of days ago.  He reports having some soreness in anterior hip with walking that he has not been having recently.  He denies having pain though.  Pt is still able to perform supine SLR without pain or issues.  Pt continues to progress with R LE strength though does get fatigued with standing on R LE to perform L LE exercises.  Pt responded well to Rx having no pain after Rx.  He should cont to benefit from cont skilled PT services to address ongoing goals and to restore PLOF.    Comorbidities Cardiac hx of A-fib and PVCs:  3 cardiac ablations and 5 cardioversions with the last one being in 2020.  orthostatic hypotension.  Penile CA:  Pt has had surgeries and chemotherapy and states he has had no signs of CA for 2 years.  Chronic renal disease.  DM.  OA. HTN.  Other PSHx include Abdominal hernia repair and L ankle surgery and bunionectomy with hammertoe reconstruction.    PT Treatment/Interventions ADLs/Self Care Home Management;Gait training;Stair training;Functional mobility training;Therapeutic activities;Therapeutic exercise;Neuromuscular re-education;Manual techniques;Patient/family education;Passive range of motion    PT Next Visit  Plan Avoid Modalties due to Hx of CA.  Pt continues to be followed for CA though he states he currently does not have CA.  Cont with stretching, and progressing strength and stability.    PT Home Exercise Plan Access Code: [email or web address].    Consulted and Agree with Plan of Care Patient             Patient will benefit from skilled therapeutic intervention in order to improve the following deficits and impairments:  Difficulty walking, Decreased activity tolerance, Decreased mobility, Decreased strength, Decreased range of motion, Impaired flexibility, Pain, Hypomobility  Visit Diagnosis: Pain in right thigh  Muscle weakness (generalized)     Problem  List Patient Active Problem List   Diagnosis Date Noted   Secondary hypercoagulable state ([place]) 06/11/2019   Typical atrial flutter ([place]) 05/09/2018   BMI 40.0-44.9, adult ([place]) 05/09/2018   Asthma 01/29/2018   Chronic renal disease 01/29/2018   Gout 01/29/2018   Penile cancer ([place]) 01/29/2018   Persistent atrial fibrillation 11/25/2015   Current use of long term anticoagulation 11/25/2015   H/O type 2 diabetes mellitus 11/25/2015   H/O: HTN (hypertension) 11/25/2015   Balanitis 09/30/2015   Atrial fibrillation ([place]) 06/30/2015   PVC (premature ventricular contraction) 03/30/2015   Hyperlipidemia 06/25/2014   Abnormal EKG 12/25/2013   Obesity 12/25/2013   Essential hypertension, benign 12/25/2013   PVC's (premature ventricular contractions) 05/07/2013   Edema    Orthostatic hypotension    HTN (hypertension)     Selinda Michaels III PT, DPT 02/12/21 12:58 PM   [facility], [place], [address] Phone: [phone]   Fax:  [phone]  Name: NIKOS ANGLEMYER MRN: [id number] Date of Birth: [birth year]

## 2021-02-15 ENCOUNTER — Ambulatory Visit (HOSPITAL_BASED_OUTPATIENT_CLINIC_OR_DEPARTMENT_OTHER): Payer: Medicare PPO | Admitting: Physical Therapy

## 2021-02-15 ENCOUNTER — Other Ambulatory Visit: Payer: Self-pay

## 2021-02-15 ENCOUNTER — Encounter (HOSPITAL_BASED_OUTPATIENT_CLINIC_OR_DEPARTMENT_OTHER): Payer: Self-pay | Admitting: Physical Therapy

## 2021-02-15 DIAGNOSIS — M6281 Muscle weakness (generalized): Secondary | ICD-10-CM

## 2021-02-15 DIAGNOSIS — M79651 Pain in right thigh: Secondary | ICD-10-CM

## 2021-02-15 NOTE — Therapy (Signed)
[facility] [address] [place], [place], [address] Phone: [phone]   Fax:  [phone]  Physical Therapy Treatment  Patient Details  Name: Mark Singh MRN: [id number] Date of Birth: 05-Nov-1945 Referring Provider (PT): Grier Mitts PA-C   Encounter Date: 02/15/2021   PT End of Session - 02/15/21 1036     Visit Number 14    Number of Visits 17    Date for PT Re-Evaluation 02/20/21    PT Start Time 6203    PT Stop Time 1110    PT Time Calculation (min) 41 min    Activity Tolerance Patient tolerated treatment well    Behavior During Therapy [facility] for tasks assessed/performed             Past Medical History:  Diagnosis Date   Arthritis    "hands, feet" (12/13/2017)   Asthma    Bradycardia    a. Repoted h/o HR in the 40s.   Edema    a. 2D echo 11/2013: EF 55-97%, LV diastolic function parameters were normal, severely dilated LA, PASP 37mHg..   H/O: gout    right foot; on daily RX" (12/13/2017)   HTN (hypertension)    Hyperlipidemia    Myalgia and myositis, unspecified    Nephrotic syndrome    a. responder to steroid therapy by Dr. DLorrene Reid    Obesity    Orthostatic hypotension    Penile cancer ([place]    "did circ"   Pulmonary hypertension ([place]    a. Mild pulm HTN felt 2/2 obesity.   PVC's (premature ventricular contractions)    a. Holter 05/2013 showing 18k PVCs (18% of the time).   Seasonal asthma    mild   Shoulder pain    Squamous carcinoma    "right hand; left shoulder" (12/13/2017)   Type 2 diabetes mellitus ([place]     Past Surgical History:  Procedure Laterality Date   A-FLUTTER ABLATION N/A 02/23/2018   Procedure: A-FLUTTER ABLATION;  Surgeon: CConstance Haw MD;  [place];  Service: Cardiovascular;  Laterality: N/A;   ATRIAL FIBRILLATION ABLATION N/A 12/13/2017   Procedure: ATRIAL FIBRILLATION ABLATION;  Surgeon: CConstance Haw MD;  [place] [facility];  Service: Cardiovascular;  Laterality: N/A;   BUNIONECTOMY WITH HAMMERTOE RECONSTRUCTION Left 1992  &  2008   REMOVAL HEAL SPUR/ BUNIONECTOMY AND HAMMERTOE REPAIR    CARDIOVERSION N/A 07/22/2015   Procedure: CARDIOVERSION;  Surgeon: KDorothy Spark MD;  [place]  Service: Cardiovascular;  Laterality: N/A;   CARDIOVERSION N/A 10/06/2015   Procedure: CARDIOVERSION;  Surgeon: TSueanne Margarita MD;  [place]  Service: Cardiovascular;  Laterality: N/A;   CARDIOVERSION N/A 12/05/2016   Procedure: CARDIOVERSION;  Surgeon: HPixie Casino MD;  [facility];  Service: Cardiovascular;  Laterality: N/A;   CARDIOVERSION N/A 06/29/2018   Procedure: CARDIOVERSION;  Surgeon: NThayer Headings MD;  [place]  Service: Cardiovascular;  Laterality: N/A;   CARDIOVERSION N/A 09/20/2018   Procedure: CARDIOVERSION;  Surgeon: RSkeet Latch MD;  [place]  Service: Cardiovascular;  Laterality: N/A;   CIRCUMCISION N/A 11/27/2012   Procedure:  CIRCUMCISION  AND EXCISION OF GLANS PENIS;  Surgeon: MFredricka Bonine MD;  [facility]  Service: Urology;  Laterality: N/A;   ELECTROPHYSIOLOGIC STUDY N/A 03/30/2015   Procedure: PVC Ablation;  Surgeon: Will MMeredith Leeds MD;  [place];  Service: Cardiovascular;  Laterality: N/A;  HERNIA REPAIR     SQUAMOUS CELL CARCINOMA EXCISION     "right hand; left shoulder" (8/52/7782)   UMBILICAL HERNIA REPAIR  11-04-2005    There were no vitals filed for this visit.   Subjective Assessment - 02/15/21 1034     Subjective Pt denies any increased pain after prior Rx, but was sore the following day.  Pt didn't perform his walking program due to having soreness.  Pt reports compliance with HEP. Pt has minimal soreness but feels better today than Saturday.    Pertinent History Cardiac hx of A-fib and PVCs.  3 cardiac ablations for atrial flutters and  PVCs.  5 cardioversions with the last one being in 2020.  orthostatic hypotension.  Penile CA:  surgery and chemotherapy in 2014 with resultant surgeries on 2017 and 2020.  Pt states he has had no signs of CA for 2 years.  Pt goes every 6 months to check penile CA for the next 3 years.  Chronic renal disease.  DM.  OA. HTN.  Abdominal hernia repair.  L ankle surgery and bunionectomy with hammertoe reconstruction.    Currently in Pain? No/denies    Pain Score 0-No pain                               [facility] Adult PT Treatment/Exercise - 02/15/21 0001       Therapeutic Activites    Therapeutic Activities Other Therapeutic Activities    Other Therapeutic Activities See flow sheet for step activities, hip hikes, and sidestepping to improve functional mobility and stability with daily functional mobility.      Exercises   Exercises Knee/Hip   Reviewed response to prior Rx, current function, pain level, and HEP compliance. .     Knee/Hip Exercises: Stretches   Other Knee/Hip Stretches supine quad stretch in modified thomas test position 2x30 sec   Pt reports no pain with stretch   Other Knee/Hip Stretches S/L hip flexor stretch 3x30 sec on R.  prone manual quad stretch 3x30 sec bilat      Knee/Hip Exercises: Aerobic   Nustep 4 mins at L4 with LEs      Knee/Hip Exercises: Standing   Other Standing Knee Exercises step ups on 6 inch 2x10 reps, lateral step ups on a 6 inch step 2x10 with UE assist, sidestepping without UE assist x 3 laps.  Hip hiking 2 x10  reps bilat  on 4 inch step with 1 UE assist      Knee/Hip Exercises: Supine   Other Supine Knee/Hip Exercises supine bridge with clams with GTB 2 x 10 reps.  Supine SLR 2x10 reps      Knee/Hip Exercises: Sidelying   Other Sidelying Knee/Hip Exercises S/L clams with RTB 2x10 reps   Pt also performed S/L clams with RTB on 02/12/2021                    PT Education - 02/15/21 1425     Education Details POC, sx  response, walking program, exercise form    Person(s) Educated Patient    Methods Explanation;Demonstration;Verbal cues    Comprehension Verbalized understanding;Returned demonstration;Verbal cues required              PT Short Term Goals - 01/18/21 1148       PT SHORT TERM GOAL #1   Title Pt will be independent and compliant with HEP for improved pain, flexibility, strength, and  function.    Time 2    Period Weeks    Status Achieved    Target Date 01/06/21      PT SHORT TERM GOAL #2   Title Pt will demo reduced tenderness to palpation in anterior thigh for reduced inflammation and soreness and improved mobility.    Time 3    Period Weeks    Status Achieved    Target Date 01/13/21      PT SHORT TERM GOAL #3   Title Pt will report reduced pain with ambulation and reduced reliance on walking stick.    Time 3    Period Weeks    Status Achieved    Target Date 01/13/21               PT Long Term Goals - 01/22/21 1136       PT LONG TERM GOAL #1   Title Pt will report he is ambulating his normal distance without his walking stick without significant pain or difficulty.    Time 4    Period Weeks    Status Partially Met    Target Date 02/19/21      PT LONG TERM GOAL #2   Title Pt will demo improved R hip and R knee extension strength to 5/5 MMT for improved tolerance with and performance of functional mobility and yard work.    Time 4    Period Weeks    Status Partially Met    Target Date 02/19/21      PT LONG TERM GOAL #3   Title Pt will be able to perform his normal standing activities without significant R LE pain.    Time 4    Period Weeks    Status On-going    Target Date 02/19/21      PT LONG TERM GOAL #4   Title pt will be able to return to working out in gym without adverse effects.    Time 4    Period Weeks    Status On-going    Target Date 02/19/21                   Plan - 02/15/21 1426     Clinical Impression Statement Pt  presents to Rx stating he is feeling better since last Rx.  He increased his walking program last week and had increased soreness.  He did not perform walking program this weekend.  He has improved overall with ambulation distance and ambulation tolerance.  Pt performed exercises well without c/o's.  He has improved with performing step ups being able to progress to an 8 inch step up today.  He performed 8 inch step ups well.  He is challenged with hip hikes though is improving with form and ease.  Pt responded well to Rx having no increased pain after Rx.  He should cont to benefit from cont skilled PT services to addresss ongoing goals and to restore PLOF.    Comorbidities Cardiac hx of A-fib and PVCs:  3 cardiac ablations and 5 cardioversions with the last one being in 2020.  orthostatic hypotension.  Penile CA:  Pt has had surgeries and chemotherapy and states he has had no signs of CA for 2 years.  Chronic renal disease.  DM.  OA. HTN.  Other PSHx include Abdominal hernia repair and L ankle surgery and bunionectomy with hammertoe reconstruction.    PT Treatment/Interventions ADLs/Self Care Home Management;Gait training;Stair training;Functional mobility training;Therapeutic activities;Therapeutic exercise;Neuromuscular re-education;Manual techniques;Patient/family education;Passive range  of motion    PT Next Visit Plan Avoid Modalties due to Hx of CA.  Pt continues to be followed for CA though he states he currently does not have CA.  Cont with stretching, walking program, and progressing strength and stability.    PT Home Exercise Plan Access Code: [email or web address].    Consulted and Agree with Plan of Care Patient             Patient will benefit from skilled therapeutic intervention in order to improve the following deficits and impairments:  Difficulty walking, Decreased activity tolerance, Decreased mobility, Decreased strength, Decreased range of motion, Impaired flexibility, Pain,  Hypomobility  Visit Diagnosis: Pain in right thigh  Muscle weakness (generalized)     Problem List Patient Active Problem List   Diagnosis Date Noted   Secondary hypercoagulable state ([place]) 06/11/2019   Typical atrial flutter ([place]) 05/09/2018   BMI 40.0-44.9, adult ([place]) 05/09/2018   Asthma 01/29/2018   Chronic renal disease 01/29/2018   Gout 01/29/2018   Penile cancer ([place]) 01/29/2018   Persistent atrial fibrillation 11/25/2015   Current use of long term anticoagulation 11/25/2015   H/O type 2 diabetes mellitus 11/25/2015   H/O: HTN (hypertension) 11/25/2015   Balanitis 09/30/2015   Atrial fibrillation ([place]) 06/30/2015   PVC (premature ventricular contraction) 03/30/2015   Hyperlipidemia 06/25/2014   Abnormal EKG 12/25/2013   Obesity 12/25/2013   Essential hypertension, benign 12/25/2013   PVC's (premature ventricular contractions) 05/07/2013   Edema    Orthostatic hypotension    HTN (hypertension)     Selinda Michaels III PT, DPT 02/15/21 3:34 PM   [facility] [address] [place], [place], [address] Phone: [phone]   Fax:  [phone]  Name: Mark Singh MRN: [id number] Date of Birth: Jun 22, 1945

## 2021-02-16 ENCOUNTER — Encounter (HOSPITAL_BASED_OUTPATIENT_CLINIC_OR_DEPARTMENT_OTHER): Payer: Medicare PPO | Admitting: Physical Therapy

## 2021-02-16 DIAGNOSIS — M25551 Pain in right hip: Secondary | ICD-10-CM | POA: Diagnosis not present

## 2021-02-19 ENCOUNTER — Other Ambulatory Visit: Payer: Self-pay

## 2021-02-19 ENCOUNTER — Encounter (HOSPITAL_BASED_OUTPATIENT_CLINIC_OR_DEPARTMENT_OTHER): Payer: Self-pay | Admitting: Physical Therapy

## 2021-02-19 ENCOUNTER — Ambulatory Visit (HOSPITAL_BASED_OUTPATIENT_CLINIC_OR_DEPARTMENT_OTHER): Payer: Medicare PPO | Admitting: Physical Therapy

## 2021-02-19 DIAGNOSIS — M6281 Muscle weakness (generalized): Secondary | ICD-10-CM

## 2021-02-19 DIAGNOSIS — M79651 Pain in right thigh: Secondary | ICD-10-CM | POA: Diagnosis not present

## 2021-02-19 NOTE — Therapy (Signed)
[facility] [address] [place], [place], [address] Phone: [phone]   Fax:  [phone]  Physical Therapy Treatment  Patient Details  Name: Mark Singh MRN: [id number] Date of Birth: Nov 18, 1945 Referring Provider (PT): Grier Mitts PA-C   Encounter Date: 02/19/2021   PT End of Session - 02/19/21 1108     Visit Number 15    Number of Visits 17    Date for PT Re-Evaluation 02/20/21    Authorization Type [employer]    PT Start Time 1104    PT Stop Time 1147    PT Time Calculation (min) 43 min    Activity Tolerance Patient tolerated treatment well    Behavior During Therapy [facility] for tasks assessed/performed             Past Medical History:  Diagnosis Date   Arthritis    "hands, feet" (12/13/2017)   Asthma    Bradycardia    a. Repoted h/o HR in the 40s.   Edema    a. 2D echo 11/2013: EF 99-37%, LV diastolic function parameters were normal, severely dilated LA, PASP 52mmHg..   H/O: gout    right foot; on daily RX" (12/13/2017)   HTN (hypertension)    Hyperlipidemia    Myalgia and myositis, unspecified    Nephrotic syndrome    a. responder to steroid therapy by Dr. Lorrene Reid.    Obesity    Orthostatic hypotension    Penile cancer ([place])    "did circ"   Pulmonary hypertension ([place])    a. Mild pulm HTN felt 2/2 obesity.   PVC's (premature ventricular contractions)    a. Holter 05/2013 showing 18k PVCs (18% of the time).   Seasonal asthma    mild   Shoulder pain    Squamous carcinoma    "right hand; left shoulder" (12/13/2017)   Type 2 diabetes mellitus ([place])     Past Surgical History:  Procedure Laterality Date   A-FLUTTER ABLATION N/A 02/23/2018   Procedure: A-FLUTTER ABLATION;  Surgeon: Constance Haw, MD;  Location: [place] [facility];  Service: Cardiovascular;  Laterality: N/A;   ATRIAL FIBRILLATION ABLATION N/A 12/13/2017   Procedure: ATRIAL FIBRILLATION ABLATION;  Surgeon: Constance Haw, MD;  Location: [place] [facility];  Service: Cardiovascular;  Laterality: N/A;   BUNIONECTOMY WITH HAMMERTOE RECONSTRUCTION Left 1992  &  2008   REMOVAL HEAL SPUR/ BUNIONECTOMY AND HAMMERTOE REPAIR    CARDIOVERSION N/A 07/22/2015   Procedure: CARDIOVERSION;  Surgeon: Dorothy Spark, MD;  Location: [place];  Service: Cardiovascular;  Laterality: N/A;   CARDIOVERSION N/A 10/06/2015   Procedure: CARDIOVERSION;  Surgeon: Sueanne Margarita, MD;  Location: [place];  Service: Cardiovascular;  Laterality: N/A;   CARDIOVERSION N/A 12/05/2016   Procedure: CARDIOVERSION;  Surgeon: Pixie Casino, MD;  Location: [facility];  Service: Cardiovascular;  Laterality: N/A;   CARDIOVERSION N/A 06/29/2018   Procedure: CARDIOVERSION;  Surgeon: Thayer Headings, MD;  Location: [place];  Service: Cardiovascular;  Laterality: N/A;   CARDIOVERSION N/A 09/20/2018   Procedure: CARDIOVERSION;  Surgeon: [name], MD;  Location: [place];  Service: Cardiovascular;  Laterality: N/A;   CIRCUMCISION N/A 11/27/2012   Procedure:  CIRCUMCISION  AND EXCISION OF GLANS PENIS;  Surgeon: Fredricka Bonine, MD;  Location: [facility];  Service: Urology;  Laterality: N/A;   ELECTROPHYSIOLOGIC STUDY N/A 03/30/2015   Procedure: PVC Ablation;  Surgeon: [name], MD;  Location: [place] [facility];  Service: Cardiovascular;  Laterality: N/A;   HERNIA REPAIR     SQUAMOUS CELL CARCINOMA EXCISION     "right hand; left shoulder" (09/03/6977)   UMBILICAL HERNIA REPAIR  11-04-2005    There were no vitals filed for this visit.   Subjective Assessment - 02/19/21 1109     Subjective Pt states he felt good after prior Rx, he felt too good.  He had no pain the following day when he went to see the PA.  pt states the pain returned the day after seeing the PA.  Pt made an appt with PA for 1 month but was told he could cancel that appt if he is doing well.  Pt rode the bike in the  gym for 1 mile and a half without adverse effects.    Pertinent History Cardiac hx of A-fib and PVCs.  3 cardiac ablations for atrial flutters and PVCs.  5 cardioversions with the last one being in 2020.  orthostatic hypotension.  Penile CA:  surgery and chemotherapy in 2014 with resultant surgeries on 2017 and 2020.  Pt states he has had no signs of CA for 2 years.  Pt goes every 6 months to check penile CA for the next 3 years.  Chronic renal disease.  DM.  OA. HTN.  Abdominal hernia repair.  L ankle surgery and bunionectomy with hammertoe reconstruction.    Currently in Pain? No/denies                               [facility] Adult PT Treatment/Exercise - 02/19/21 0001       Therapeutic Activites    Therapeutic Activities Other Therapeutic Activities    Other Therapeutic Activities See flow sheet for step activities, hip hikes, and sidestepping to improve functional mobility and stability with daily functional mobility.      Exercises   Exercises Knee/Hip   Reviewed response to prior Rx, current function, pain level, and HEP compliance. .     Knee/Hip Exercises: Stretches   Other Knee/Hip Stretches S/L hip flexor stretch 3x30 sec on R.  prone manual quad stretch 3x30 sec bilat      Knee/Hip Exercises: Aerobic   Nustep 5 mins at L4 with LEs      Knee/Hip Exercises: Standing   Other Standing Knee Exercises step ups on 6 inch 2x10 reps, lateral step ups on a 6 inch step 2x10 with UE assist, sidestepping without UE assist x 3 laps.  Hip hiking 2 x10  reps bilat  on 4 inch step with 1 UE assist 1x10 reps on floor      Knee/Hip Exercises: Supine   Other Supine Knee/Hip Exercises supine bridge with clams with GTB 2 x 10 reps.  Supine SLR 2x10 reps      Knee/Hip Exercises: Sidelying   Other Sidelying Knee/Hip Exercises S/L clams with RTB 2x10 reps   Pt also performed S/L clams with RTB on 02/12/2021                    PT Education - 02/19/21 1145     Education  Details POC.  Reviewed and updated HEP.  Access Code: [facility]  URL: [email or web address]  Date: 02/19/2021  Prepared by: Ronny Flurry    Exercises  Modified Marcello Moores Stretch - 2 x daily - 6-7 x weekly - 1 sets - 2 reps - 20-30 seconds hold  Supine Quadricep Sets - 2 x daily - 6-7  x weekly - 2 sets - 10 reps - 5 seconds hold  Supine Bridge - 1 x daily - 5-6 x weekly - 2 sets - 10 reps  Hooklying Clamshell with Resistance - 1 x daily - 3-4 x weekly - 2 sets - 10 reps  Clamshell with Resistance - 1 x daily - 3-4 x weekly - 2 sets - 10 reps  Supine Active Straight Leg Raise - 1 x daily - 4-5 x weekly - 2 sets - 10 reps  Standing Hip Hiking - 1 x daily - 5 x weekly - 2 sets - 10 reps    Person(s) Educated Patient    Methods Explanation;Demonstration;Verbal cues;Handout    Comprehension Verbalized understanding;Returned demonstration              PT Short Term Goals - 01/18/21 1148       PT SHORT TERM GOAL #1   Title Pt will be independent and compliant with HEP for improved pain, flexibility, strength, and function.    Time 2    Period Weeks    Status Achieved    Target Date 01/06/21      PT SHORT TERM GOAL #2   Title Pt will demo reduced tenderness to palpation in anterior thigh for reduced inflammation and soreness and improved mobility.    Time 3    Period Weeks    Status Achieved    Target Date 01/13/21      PT SHORT TERM GOAL #3   Title Pt will report reduced pain with ambulation and reduced reliance on walking stick.    Time 3    Period Weeks    Status Achieved    Target Date 01/13/21               PT Long Term Goals - 01/22/21 1136       PT LONG TERM GOAL #1   Title Pt will report he is ambulating his normal distance without his walking stick without significant pain or difficulty.    Time 4    Period Weeks    Status Partially Met    Target Date 02/19/21      PT LONG TERM GOAL #2   Title Pt will demo improved R hip and R knee extension strength to  5/5 MMT for improved tolerance with and performance of functional mobility and yard work.    Time 4    Period Weeks    Status Partially Met    Target Date 02/19/21      PT LONG TERM GOAL #3   Title Pt will be able to perform his normal standing activities without significant R LE pain.    Time 4    Period Weeks    Status On-going    Target Date 02/19/21      PT LONG TERM GOAL #4   Title pt will be able to return to working out in gym without adverse effects.    Time 4    Period Weeks    Status On-going    Target Date 02/19/21                   Plan - 02/19/21 1141     Clinical Impression Statement Pt is progressing well with pain, sx's, and ambulation.  Pt has not been performing walking program as much this week but has been progressing walking program slowly.  He demonstrates improved strength as evidenced by performance of exercises.  Pt performed Hip hikes on  floor for 1 set today in order to practice the way he would do them at home.  PT reviewed and updated HEP; pt demonstrates good understanding of HEP.  Pt responded well to Rx having no pain after Rx.    Comorbidities Cardiac hx of A-fib and PVCs:  3 cardiac ablations and 5 cardioversions with the last one being in 2020.  orthostatic hypotension.  Penile CA:  Pt has had surgeries and chemotherapy and states he has had no signs of CA for 2 years.  Chronic renal disease.  DM.  OA. HTN.  Other PSHx include Abdominal hernia repair and L ankle surgery and bunionectomy with hammertoe reconstruction.    PT Treatment/Interventions ADLs/Self Care Home Management;Gait training;Stair training;Functional mobility training;Therapeutic activities;Therapeutic exercise;Neuromuscular re-education;Manual techniques;Patient/family education;Passive range of motion    PT Next Visit Plan PN next visit.  Consider PN vs discharge.  Avoid Modalties due to Hx of CA.  Pt continues to be followed for CA though he states he currently does not have  CA.  Cont with stretching, walking program, and progressing strength and stability.    PT Home Exercise Plan Access Code: [email or web address]    Consulted and Agree with Plan of Care Patient             Patient will benefit from skilled therapeutic intervention in order to improve the following deficits and impairments:  Difficulty walking, Decreased activity tolerance, Decreased mobility, Decreased strength, Decreased range of motion, Impaired flexibility, Pain, Hypomobility  Visit Diagnosis: Pain in right thigh  Muscle weakness (generalized)     Problem List Patient Active Problem List   Diagnosis Date Noted   Secondary hypercoagulable state ([place]) 06/11/2019   Typical atrial flutter ([place]) 05/09/2018   BMI 40.0-44.9, adult ([place]) 05/09/2018   Asthma 01/29/2018   Chronic renal disease 01/29/2018   Gout 01/29/2018   Penile cancer ([place]) 01/29/2018   Persistent atrial fibrillation 11/25/2015   Current use of long term anticoagulation 11/25/2015   H/O type 2 diabetes mellitus 11/25/2015   H/O: HTN (hypertension) 11/25/2015   Balanitis 09/30/2015   Atrial fibrillation ([place]) 06/30/2015   PVC (premature ventricular contraction) 03/30/2015   Hyperlipidemia 06/25/2014   Abnormal EKG 12/25/2013   Obesity 12/25/2013   Essential hypertension, benign 12/25/2013   PVC's (premature ventricular contractions) 05/07/2013   Edema    Orthostatic hypotension    HTN (hypertension)     Selinda Michaels III PT, DPT 02/19/21 1:06 PM   [place], [place], [address] Phone: [phone]   Fax:  [phone]  Name: JENS SIEMS MRN: [id number] Date of Birth: 1945-11-02

## 2021-02-23 ENCOUNTER — Other Ambulatory Visit: Payer: Self-pay

## 2021-02-23 ENCOUNTER — Encounter (HOSPITAL_BASED_OUTPATIENT_CLINIC_OR_DEPARTMENT_OTHER): Payer: Self-pay | Admitting: Physical Therapy

## 2021-02-23 ENCOUNTER — Encounter (HOSPITAL_BASED_OUTPATIENT_CLINIC_OR_DEPARTMENT_OTHER): Payer: Medicare PPO | Attending: Family Medicine | Admitting: Physical Therapy

## 2021-02-23 DIAGNOSIS — M6281 Muscle weakness (generalized): Secondary | ICD-10-CM | POA: Insufficient documentation

## 2021-02-23 DIAGNOSIS — M79651 Pain in right thigh: Secondary | ICD-10-CM | POA: Diagnosis not present

## 2021-02-24 NOTE — Therapy (Signed)
[facility] [address] [place], [place], [address] Phone: [phone]   Fax:  [phone]  Physical Therapy Treatment/Discharge Summary  Patient Details  Name: Mark Singh MRN: [id number] Date of Birth: July 30, 1945 Referring Provider (PT): Grier Mitts PA-C   Encounter Date: 02/23/2021   PT End of Session - 02/23/21 1116     Visit Number 16    Number of Visits 16    Date for PT Re-Evaluation 02/20/21    Authorization Type [employer]    PT Start Time 1110    PT Stop Time 1154    PT Time Calculation (min) 44 min    Activity Tolerance Patient tolerated treatment well    Behavior During Therapy [facility] for tasks assessed/performed             Past Medical History:  Diagnosis Date   Arthritis    "hands, feet" (12/13/2017)   Asthma    Bradycardia    a. Repoted h/o HR in the 40s.   Edema    a. 2D echo 11/2013: EF 12-45%, LV diastolic function parameters were normal, severely dilated LA, PASP 56mmHg..   H/O: gout    right foot; on daily RX" (12/13/2017)   HTN (hypertension)    Hyperlipidemia    Myalgia and myositis, unspecified    Nephrotic syndrome    a. responder to steroid therapy by Dr. Lorrene Reid.    Obesity    Orthostatic hypotension    Penile cancer ([place])    "did circ"   Pulmonary hypertension ([place])    a. Mild pulm HTN felt 2/2 obesity.   PVC's (premature ventricular contractions)    a. Holter 05/2013 showing 18k PVCs (18% of the time).   Seasonal asthma    mild   Shoulder pain    Squamous carcinoma    "right hand; left shoulder" (12/13/2017)   Type 2 diabetes mellitus ([place])     Past Surgical History:  Procedure Laterality Date   A-FLUTTER ABLATION N/A 02/23/2018   Procedure: A-FLUTTER ABLATION;  Surgeon: Constance Haw, MD;  Location: [place] [facility];  Service: Cardiovascular;  Laterality: N/A;   ATRIAL FIBRILLATION ABLATION N/A 12/13/2017   Procedure: ATRIAL FIBRILLATION ABLATION;   Surgeon: Constance Haw, MD;  Location: [place] [facility];  Service: Cardiovascular;  Laterality: N/A;   BUNIONECTOMY WITH HAMMERTOE RECONSTRUCTION Left 1992  &  2008   REMOVAL HEAL SPUR/ BUNIONECTOMY AND HAMMERTOE REPAIR    CARDIOVERSION N/A 07/22/2015   Procedure: CARDIOVERSION;  Surgeon: Dorothy Spark, MD;  Location: [place];  Service: Cardiovascular;  Laterality: N/A;   CARDIOVERSION N/A 10/06/2015   Procedure: CARDIOVERSION;  Surgeon: Sueanne Margarita, MD;  Location: [place];  Service: Cardiovascular;  Laterality: N/A;   CARDIOVERSION N/A 12/05/2016   Procedure: CARDIOVERSION;  Surgeon: Pixie Casino, MD;  Location: [facility];  Service: Cardiovascular;  Laterality: N/A;   CARDIOVERSION N/A 06/29/2018   Procedure: CARDIOVERSION;  Surgeon: Thayer Headings, MD;  Location: [place];  Service: Cardiovascular;  Laterality: N/A;   CARDIOVERSION N/A 09/20/2018   Procedure: CARDIOVERSION;  Surgeon: [name], MD;  Location: [place];  Service: Cardiovascular;  Laterality: N/A;   CIRCUMCISION N/A 11/27/2012   Procedure:  CIRCUMCISION  AND EXCISION OF GLANS PENIS;  Surgeon: Fredricka Bonine, MD;  Location: [facility];  Service: Urology;  Laterality: N/A;   ELECTROPHYSIOLOGIC STUDY N/A 03/30/2015   Procedure: PVC Ablation;  Surgeon: [name], MD;  Location: [place] [facility]  LAB;  Service: Cardiovascular;  Laterality: N/A;   HERNIA REPAIR     SQUAMOUS CELL CARCINOMA EXCISION     "right hand; left shoulder" (7/82/9562)   UMBILICAL HERNIA REPAIR  11-04-2005    There were no vitals filed for this visit.   Subjective Assessment - 02/24/21 0941     Subjective Pt states he felt fine after prior Rx and had no adverse effects.  Pt has an appt with PA for 1 month but may cancel that appt if he is doing well.  pt states he is doing better and has improved with walking.  Pt is able to ambulate his normal distance without significant pain or  difficulty.  Pt has been progressing walking program.  He is able to perform his normal standing activities without significant pain or difficulty.    Pertinent History Cardiac hx of A-fib and PVCs.  3 cardiac ablations for atrial flutters and PVCs.  5 cardioversions with the last one being in 2020.  orthostatic hypotension.  Penile CA:  surgery and chemotherapy in 2014 with resultant surgeries on 2017 and 2020.  Pt states he has had no signs of CA for 2 years.  Pt goes every 6 months to check penile CA for the next 3 years.  Chronic renal disease.  DM.  OA. HTN.  Abdominal hernia repair.  L ankle surgery and bunionectomy with hammertoe reconstruction.    Currently in Pain? No/denies                [facility] PT Assessment - 02/24/21 0001       Observation/Other Assessments   Lower Extremity Functional Scale  57/80      Strength   Right Hip Flexion 5/5    Right Hip Extension 4+/5    Right Hip External Rotation  5/5    Right Hip ABduction 4+/5      Special Tests   Other special tests Pt does have tightness in bilat quads though Ely's Test was negative bilat                           [facility] Adult PT Treatment/Exercise - 02/24/21 0001       Ambulation/Gait   Gait Comments WNL, no limp      Exercises   Exercises Knee/Hip   Reviewed response to prior Rx, current function, pain level, and HEP compliance. .     Knee/Hip Exercises: Aerobic   Nustep 5 mins at L5 with LEs      Knee/Hip Exercises: Machines for Strengthening   Cybex Knee Extension 25# x 10 reps, 40# x 10 reps    Cybex Knee Flexion 25# x 10 reps, 40# x 10 reps    Cybex Leg Press 90# x 10 reps, 110#  2x 10 reps                     PT Education - 02/23/21 1130     Education Details Reviewed his LE gym routine and educated with pt in correct form with gym exercises and proper set up on macines.  Objective findings and POC.  Answered Pt's questions.    Person(s) Educated Patient    Methods  Explanation;Demonstration;Verbal cues    Comprehension Verbalized understanding;Returned demonstration              PT Short Term Goals - 01/18/21 1148       PT SHORT TERM GOAL #1   Title Pt will  be independent and compliant with HEP for improved pain, flexibility, strength, and function.    Time 2    Period Weeks    Status Achieved    Target Date 01/06/21      PT SHORT TERM GOAL #2   Title Pt will demo reduced tenderness to palpation in anterior thigh for reduced inflammation and soreness and improved mobility.    Time 3    Period Weeks    Status Achieved    Target Date 01/13/21      PT SHORT TERM GOAL #3   Title Pt will report reduced pain with ambulation and reduced reliance on walking stick.    Time 3    Period Weeks    Status Achieved    Target Date 01/13/21               PT Long Term Goals - 02/23/21 1126       PT LONG TERM GOAL #1   Title Pt will report he is ambulating his normal distance without his walking stick without significant pain or difficulty.    Time 4    Status Achieved    Target Date 02/19/21      PT LONG TERM GOAL #2   Title Pt will demo improved R hip and R knee extension strength to 5/5 MMT for improved tolerance with and performance of functional mobility and yard work.    Status Partially Met      PT LONG TERM GOAL #3   Title Pt will be able to perform his normal standing activities without significant R LE pain.    Time 4    Status Achieved    Target Date 02/19/21      PT LONG TERM GOAL #4   Title pt will be able to return to working out in gym without adverse effects.    Baseline pt primarily performing upper body in the gym.    Time 4    Period Weeks    Status Partially Met    Target Date 02/19/21                   Plan - 02/23/21 1125     Clinical Impression Statement Pt has made excellent progress in PT.  He is ambulating his normal distance and peforming his normal standing activities without significant  pain or difficulty.  Pt is progressing his walking program.  Pt demonstrates improved R hip flexion and extension strength.   Pt demonstrates clinically significant improvement in self perceived disability with LEFS improving from 34/80 prior to 57/80 currently.  Pt has met all of his goals except partially meeting LTG# 2 and 4.  Pt is independent and compliant with HEP.  PT reviewed with pt gym exercises and educated pt in correct form and appropriate weight.  He performed exercises well without c/o's.  Pt is ready for discharge and is agreeable with discharge.    Comorbidities Cardiac hx of A-fib and PVCs:  3 cardiac ablations and 5 cardioversions with the last one being in 2020.  orthostatic hypotension.  Penile CA:  Pt has had surgeries and chemotherapy and states he has had no signs of CA for 2 years.  Chronic renal disease.  DM.  OA. HTN.  Other PSHx include Abdominal hernia repair and L ankle surgery and bunionectomy with hammertoe reconstruction.    PT Treatment/Interventions ADLs/Self Care Home Management;Gait training;Stair training;Functional mobility training;Therapeutic activities;Therapeutic exercise;Neuromuscular re-education;Manual techniques;Patient/family education;Passive range of motion    PT  Next Visit Plan Pt to be discharged from skilled PT due to meeting the majority of goals and good functional progress.    PT Home Exercise Plan Access Code: [email or web address]    Consulted and Agree with Plan of Care Patient             Patient will benefit from skilled therapeutic intervention in order to improve the following deficits and impairments:  Difficulty walking, Decreased activity tolerance, Decreased mobility, Decreased strength, Decreased range of motion, Impaired flexibility, Pain, Hypomobility  Visit Diagnosis: Pain in right thigh  Muscle weakness (generalized)     Problem List Patient Active Problem List   Diagnosis Date Noted   Secondary hypercoagulable state ([place])  06/11/2019   Typical atrial flutter ([place]) 05/09/2018   BMI 40.0-44.9, adult ([place]) 05/09/2018   Asthma 01/29/2018   Chronic renal disease 01/29/2018   Gout 01/29/2018   Penile cancer ([place]) 01/29/2018   Persistent atrial fibrillation 11/25/2015   Current use of long term anticoagulation 11/25/2015   H/O type 2 diabetes mellitus 11/25/2015   H/O: HTN (hypertension) 11/25/2015   Balanitis 09/30/2015   Atrial fibrillation ([place]) 06/30/2015   PVC (premature ventricular contraction) 03/30/2015   Hyperlipidemia 06/25/2014   Abnormal EKG 12/25/2013   Obesity 12/25/2013   Essential hypertension, benign 12/25/2013   PVC's (premature ventricular contractions) 05/07/2013   Edema    Orthostatic hypotension    HTN (hypertension)     PHYSICAL THERAPY DISCHARGE SUMMARY  Visits from Start of Care: 16  Current functional level related to goals / functional outcomes: See above   Remaining deficits: See above   Education / Equipment: See above   Patient agrees to discharge. Pt met the majority of his goals.  Patient is being discharged due to  meeting the majority of his goals and making good functional progress.  Pt is going to cont with HEP and also cont with gym program.  PT also spoke with trainer with pt present.    Selinda Michaels III PT, DPT 02/24/21 10:20 AM    [facility] [place], [place], [address] Phone: [phone]   Fax:  [phone]  Name: Mark Singh MRN: [id number] Date of Birth: 1945-11-26

## 2021-02-25 DIAGNOSIS — G4734 Idiopathic sleep related nonobstructive alveolar hypoventilation: Secondary | ICD-10-CM | POA: Diagnosis not present

## 2021-02-25 DIAGNOSIS — G4733 Obstructive sleep apnea (adult) (pediatric): Secondary | ICD-10-CM | POA: Diagnosis not present

## 2021-02-25 DIAGNOSIS — I10 Essential (primary) hypertension: Secondary | ICD-10-CM | POA: Diagnosis not present

## 2021-02-25 DIAGNOSIS — J45909 Unspecified asthma, uncomplicated: Secondary | ICD-10-CM | POA: Diagnosis not present

## 2021-02-26 ENCOUNTER — Encounter (HOSPITAL_BASED_OUTPATIENT_CLINIC_OR_DEPARTMENT_OTHER): Payer: Medicare PPO | Admitting: Physical Therapy

## 2021-02-26 DIAGNOSIS — J45909 Unspecified asthma, uncomplicated: Secondary | ICD-10-CM | POA: Diagnosis not present

## 2021-02-26 DIAGNOSIS — G4733 Obstructive sleep apnea (adult) (pediatric): Secondary | ICD-10-CM | POA: Diagnosis not present

## 2021-02-26 DIAGNOSIS — G4734 Idiopathic sleep related nonobstructive alveolar hypoventilation: Secondary | ICD-10-CM | POA: Diagnosis not present

## 2021-02-26 DIAGNOSIS — I10 Essential (primary) hypertension: Secondary | ICD-10-CM | POA: Diagnosis not present

## 2021-03-09 ENCOUNTER — Other Ambulatory Visit: Payer: Medicare PPO

## 2021-03-12 DIAGNOSIS — Z1389 Encounter for screening for other disorder: Secondary | ICD-10-CM | POA: Diagnosis not present

## 2021-03-12 DIAGNOSIS — Z0000 Encounter for general adult medical examination without abnormal findings: Secondary | ICD-10-CM | POA: Diagnosis not present

## 2021-03-16 ENCOUNTER — Encounter: Payer: Self-pay | Admitting: Cardiology

## 2021-03-16 ENCOUNTER — Telehealth: Payer: Self-pay | Admitting: Cardiology

## 2021-03-16 ENCOUNTER — Ambulatory Visit: Payer: Medicare PPO | Admitting: Cardiology

## 2021-03-16 ENCOUNTER — Other Ambulatory Visit: Payer: Medicare PPO | Admitting: *Deleted

## 2021-03-16 ENCOUNTER — Other Ambulatory Visit: Payer: Self-pay

## 2021-03-16 VITALS — BP 130/68 | HR 68 | Resp 18 | Ht 69.0 in | Wt 272.0 lb

## 2021-03-16 DIAGNOSIS — I4819 Other persistent atrial fibrillation: Secondary | ICD-10-CM | POA: Diagnosis not present

## 2021-03-16 DIAGNOSIS — Z01812 Encounter for preprocedural laboratory examination: Secondary | ICD-10-CM | POA: Diagnosis not present

## 2021-03-16 DIAGNOSIS — Z01818 Encounter for other preprocedural examination: Secondary | ICD-10-CM | POA: Diagnosis not present

## 2021-03-16 DIAGNOSIS — Z79899 Other long term (current) drug therapy: Secondary | ICD-10-CM

## 2021-03-16 LAB — BASIC METABOLIC PANEL
BUN/Creatinine Ratio: 11 (ref 10–24)
BUN: 33 mg/dL — ABNORMAL HIGH (ref 8–27)
CO2: 26 mmol/L (ref 20–29)
Calcium: 9.4 mg/dL (ref 8.6–10.2)
Chloride: 99 mmol/L (ref 96–106)
Creatinine, Ser: 2.95 mg/dL — ABNORMAL HIGH (ref 0.76–1.27)
Glucose: 98 mg/dL (ref 70–99)
Potassium: 4.7 mmol/L (ref 3.5–5.2)
Sodium: 138 mmol/L (ref 134–144)
eGFR: 21 mL/min/{1.73_m2} — ABNORMAL LOW (ref >59)

## 2021-03-16 LAB — CBC
Hematocrit: 33.8 % — ABNORMAL LOW (ref 37.5–51.0)
Hemoglobin: 11.2 g/dL — ABNORMAL LOW (ref 13.0–17.7)
MCH: 28 pg (ref 26.6–33.0)
MCHC: 33.1 g/dL (ref 31.5–35.7)
MCV: 85 fL (ref 79–97)
Platelets: 318 10*3/uL (ref 150–450)
RBC: 4 x10E6/uL — ABNORMAL LOW (ref 4.14–5.80)
RDW: 13.2 % (ref 11.6–15.4)
WBC: 7.9 10*3/uL (ref 3.4–10.8)

## 2021-03-16 LAB — HEPATIC FUNCTION PANEL
ALT: 17 IU/L (ref 0–44)
AST: 17 IU/L (ref 0–40)
Albumin: 4.3 g/dL (ref 3.7–4.7)
Alkaline Phosphatase: 91 IU/L (ref 44–121)
Bilirubin Total: 0.4 mg/dL (ref 0.0–1.2)
Bilirubin, Direct: 0.17 mg/dL (ref 0.00–0.40)
Total Protein: 7.1 g/dL (ref 6.0–8.5)

## 2021-03-16 LAB — T4, FREE: Free T4: 1 ng/dL (ref 0.82–1.77)

## 2021-03-16 MED ORDER — AMIODARONE HCL 100 MG PO TABS: 100.0000 mg | ORAL_TABLET | Freq: Every day | ORAL | 2 refills | Status: DC

## 2021-03-16 MED ORDER — AMIODARONE HCL 200 MG PO TABS: 200.0000 mg | ORAL_TABLET | Freq: Every day | ORAL | 3 refills | Status: DC

## 2021-03-16 NOTE — Progress Notes (Signed)
Electrophysiology Office Note   Date:  03/16/2021   ID:  Mark Singh, DOB 10-Oct-1945, MRN [id number]  PCP:  Kathyrn Lass, MD  Cardiologist:  Marlou Porch Primary Electrophysiologist:  Arun Herrod Meredith Leeds, MD    No chief complaint on file.    History of Present Illness: Mark Singh is a 75 y.o. male who presents today for electrophysiology evaluation.     He has a history significant for PVCs, nephrotic syndrome, diabetes, hypertension, hyperlipidemia, morbid obesity, pulmonary hypertension.  He is status post PVC ablation.  PVCs were inferior to the left coronary cusp.  Ablation was performed on 03/30/2015.  He subsequently went into atrial fibrillation. He had an ablation for atrial fibrillation 12/13/2017.  He then presented in atrial flutter and is status post ablation 02/26/2018.  Today, denies symptoms of palpitations, chest pain, orthopnea, PND, lower extremity edema, claudication, dizziness, presyncope, syncope, bleeding, or neurologic sequela. The patient is tolerating medications without difficulties.  He has been having more shortness of breath and fatigue.  States that he went into atrial fibrillation in September.  He has found no indication as to why he went into atrial fibrillation.  He states that he has had some difficulty with prolonged exertion.  He would like to get back into normal rhythm.  Past Medical History:  Diagnosis Date   Arthritis    "hands, feet" (12/13/2017)   Asthma    Bradycardia    a. Repoted h/o HR in the 40s.   Edema    a. 2D echo 11/2013: EF 06-23%, LV diastolic function parameters were normal, severely dilated LA, PASP 12mmHg..   H/O: gout    right foot; on daily RX" (12/13/2017)   HTN (hypertension)    Hyperlipidemia    Myalgia and myositis, unspecified    Nephrotic syndrome    a. responder to steroid therapy by Dr. Lorrene Reid.    Obesity    Orthostatic hypotension    Penile cancer ([place])    "did circ"   Pulmonary hypertension  ([place])    a. Mild pulm HTN felt 2/2 obesity.   PVC's (premature ventricular contractions)    a. Holter 05/2013 showing 18k PVCs (18% of the time).   Seasonal asthma    mild   Shoulder pain    Squamous carcinoma    "right hand; left shoulder" (12/13/2017)   Type 2 diabetes mellitus ([place])    Past Surgical History:  Procedure Laterality Date   A-FLUTTER ABLATION N/A 02/23/2018   Procedure: A-FLUTTER ABLATION;  Surgeon: Constance Haw, MD;  Location: [place] [facility];  Service: Cardiovascular;  Laterality: N/A;   ATRIAL FIBRILLATION ABLATION N/A 12/13/2017   Procedure: ATRIAL FIBRILLATION ABLATION;  Surgeon: Constance Haw, MD;  Location: [place] [facility];  Service: Cardiovascular;  Laterality: N/A;   BUNIONECTOMY WITH HAMMERTOE RECONSTRUCTION Left 1992  &  2008   REMOVAL HEAL SPUR/ BUNIONECTOMY AND HAMMERTOE REPAIR    CARDIOVERSION N/A 07/22/2015   Procedure: CARDIOVERSION;  Surgeon: Dorothy Spark, MD;  Location: [place];  Service: Cardiovascular;  Laterality: N/A;   CARDIOVERSION N/A 10/06/2015   Procedure: CARDIOVERSION;  Surgeon: Sueanne Margarita, MD;  Location: [place] [facility]OSCOPY;  Service: Cardiovascular;  Laterality: N/A;   CARDIOVERSION N/A 12/05/2016   Procedure: CARDIOVERSION;  Surgeon: Pixie Casino, MD;  Location: [place];  Service: Cardiovascular;  Laterality: N/A;   CARDIOVERSION N/A 06/29/2018   Procedure: CARDIOVERSION;  Surgeon: [name] Wonda Cheng, MD;  Location: [place];  Service: Cardiovascular;  Laterality: N/A;  CARDIOVERSION N/A 09/20/2018   Procedure: CARDIOVERSION;  Surgeon: [name], MD;  Location: [place];  Service: Cardiovascular;  Laterality: N/A;   CIRCUMCISION N/A 11/27/2012   Procedure:  CIRCUMCISION  AND EXCISION OF GLANS PENIS;  Surgeon: Fredricka Bonine, MD;  Location: [facility];  Service: Urology;  Laterality: N/A;   ELECTROPHYSIOLOGIC STUDY N/A 03/30/2015   Procedure: PVC Ablation;  Surgeon: Duha Abair  Meredith Leeds, MD;  Location: [place] [facility];  Service: Cardiovascular;  Laterality: N/A;   HERNIA REPAIR     SQUAMOUS CELL CARCINOMA EXCISION     "right hand; left shoulder" (3/49/1791)   UMBILICAL HERNIA REPAIR  11-04-2005     Current Outpatient Medications  Medication Sig Dispense Refill   ALLOPURINOL PO Take 50 mg by mouth daily.     amiodarone (PACERONE) 100 MG tablet Take 1 tablet (100 mg total) by mouth every other day. 90 tablet 3   amLODipine (NORVASC) 10 MG tablet TAKE 1 TABLET BY MOUTH EVERY DAY 90 tablet 1   atorvastatin (LIPITOR) 40 MG tablet Take 40 mg by mouth daily.     BD PEN NEEDLE NANO 2ND GEN 32G X 4 MM MISC      betamethasone valerate ointment (VALISONE) 0.1 % APPLY TO AFFECTED AREA TWICE A DAY     Cholecalciferol (VITAMIN D3) 5000 units CAPS Take 5,000 Units by mouth daily.      cloNIDine (CATAPRES) 0.1 MG tablet Take 1 tablet (0.1 mg total) by mouth daily. (03-02-20)ONCE A DAY FOR 5 DAYS THEN EVERY OTHER FOR  5 DAYS THEN STOP  ONLY IF BLOOD PRESSURE IS BELOW 140/80 CONSISTENTLY) (Patient taking differently: Take 0.1 mg by mouth daily. (03-02-20)ONCE A DAY FOR 5 DAYS THEN EVERY OTHER FOR  5 DAYS THEN STOP  ONLY IF BLOOD PRESSURE IS BELOW 140/80 CONSISTENTLY)) 60 tablet 11   Cyanocobalamin (VITAMIN B12) 3000 MCG SUBL Take 3,000 mcg by mouth daily.     hydrALAZINE (APRESOLINE) 50 MG tablet TAKE 1 TABLET (50 MG TOTAL) BY MOUTH IN THE MORNING AND AT BEDTIME. 180 tablet 2   KLOR-CON M20 20 MEQ tablet TAKE 1 TABLET BY MOUTH EVERY DAY 90 tablet 3   LANTUS SOLOSTAR 100 UNIT/ML Solostar Pen Inject 40 Units into the skin at bedtime.   0   losartan (COZAAR) 100 MG tablet TAKE 1 TABLET BY MOUTH EVERY DAY 90 tablet 2   omeprazole (PRILOSEC) 20 MG capsule Take 20 mg by mouth daily.     OneTouch Delica Lancets 50V MISC USE AS DIRECTED EVERY DAY     ONETOUCH VERIO test strip USE AS DIRECTED (TEST DAILY) 90 **E11.9     pimecrolimus (ELIDEL) 1 % cream Apply 1 application topically 2  (two) times daily as needed (psoriasis).      probenecid (BENEMID) 500 MG tablet Take 500 mg by mouth daily.      Rivaroxaban (XARELTO) 15 MG TABS tablet Take 1 tablet (15 mg total) by mouth daily with supper. 90 tablet 1   spironolactone (ALDACTONE) 25 MG tablet Take 0.5 tablets (12.5 mg total) by mouth daily. 45 tablet 3   tamsulosin (FLOMAX) 0.4 MG CAPS capsule Take 0.4 mg by mouth every evening.   4   torsemide (DEMADEX) 20 MG tablet TAKE 3 TABLETS BY MOUTH EVERY DAY 270 tablet 3   WIXELA INHUB 100-50 MCG/DOSE AEPB Inhale 2 puffs into the lungs daily as needed.     No current facility-administered medications for this visit.    Allergies:  Indomethacin   Social History:  The patient  reports that he has never smoked. He has never used smokeless tobacco. He reports that he does not drink alcohol and does not use drugs.   Family History:  The patient's family history includes CVA in his father; Coronary artery disease in his mother.   ROS:  Please see the history of present illness.   Otherwise, review of systems is positive for none.   All other systems are reviewed and negative.   PHYSICAL EXAM: VS:  BP 130/68   Pulse 68   Resp 18   Ht 5\' 9"  (1.753 m)   Wt 272 lb (123.4 kg)   SpO2 97%   BMI 40.17 kg/m  , BMI Body mass index is 40.17 kg/m. GEN: Well nourished, well developed, in no acute distress  HEENT: normal  Neck: no JVD, carotid bruits, or masses Cardiac: Irregular; no murmurs, rubs, or gallops,no edema  Respiratory:  clear to auscultation bilaterally, normal work of breathing GI: soft, nontender, nondistended, + BS MS: no deformity or atrophy  Skin: warm and dry Neuro:  Strength and sensation are intact Psych: euthymic mood, full affect  EKG:  EKG is ordered today. Personal review of the ekg ordered shows atrial fibrillation, rate 68  Recent Labs: 05/21/2020: BUN 21; Creatinine, Ser 2.17; Potassium 4.7; Sodium 141 11/25/2020: TSH 4.720    Lipid Panel  No  results found for: CHOL, TRIG, HDL, CHOLHDL, VLDL, LDLCALC, LDLDIRECT   Wt Readings from Last 3 Encounters:  03/16/21 272 lb (123.4 kg)  10/12/20 260 lb (117.9 kg)  09/15/20 263 lb (119.3 kg)      Other studies Reviewed: Additional studies/ records that were reviewed today include: TTE 12/28/14  Review of the above records today demonstrates:  - Left ventricle: The cavity size was normal. There was mild   concentric hypertrophy. Systolic function was normal. The   estimated ejection fraction was in the range of 55% to 60%.   Although no diagnostic regional wall motion abnormality was   identified, this possibility cannot be completely excluded on the   basis of this study. Left ventricular diastolic function   parameters were normal. - Left atrium: The atrium was severely dilated. - Pulmonary arteries: Systolic pressure was mildly increased. PA   peak pressure: 38 mm Hg (S).   ASSESSMENT AND PLAN:  1.  PVCs: Status post ablation 03/10/2015.  No obvious recurrence.  Continue with current management.  2.  Persistent atrial fibrillation: Status post ablation 12/23/2017. Currently on Xarelto 15 mg daily, amiodarone 100 mg every other day.  High risk medication monitoring for amiodarone via labs and ECG.  CHA2DS2-VASc of 3.  Unfortunately he is in atrial fibrillation today.  Due to that, we Adrik Khim increase his amiodarone to 100 mg daily.  We Elodie Panameno also plan for cardioversion to get him back into normal rhythm.  3.  Typical atrial flutter: Status post ablation 02/26/2018.  No obvious recurrence.  4.  Chronic diastolic heart failure: No obvious volume overload.  5.  Stage 45 CKD: Followed by nephrology.  Continue torsemide  6.  Hypertension: Currently well controlled.  Recent blood pressure recordings from home 130/80.  7.  Obstructive sleep apnea: CPAP compliance encouraged  Current medicines are reviewed at length with the patient today.   The patient does not have concerns regarding  his medicines.  The following changes were made today: Increase amiodarone  Labs/ tests ordered today include:  Orders Placed This Encounter  Procedures  EKG 12-Lead      Disposition:   FU with Meily Glowacki 6 months  Signed, Wretha Laris Meredith Leeds, MD  03/16/2021 10:20 AM     [facility] [address] [place] [address] [phone] (office) [phone] (fax)

## 2021-03-16 NOTE — Patient Instructions (Addendum)
Medication Instructions:  Your physician has recommended you make the following change in your medication:  TAKE Amiodarone 100 mg once DAILY  *If you need a refill on your cardiac medications before your next appointment, please call your pharmacy*   Lab Work: Pre procedure labs today: BMET, CBC & LFTs  If you have labs (blood work) drawn today and your tests are completely normal, you will receive your results only by: [place] (if you have MyChart) OR A paper copy in the mail If you have any lab test that is abnormal or we need to change your treatment, we will call you to review the results.   Testing/Procedures: Your physician has recommended that you have a Cardioversion (DCCV). Electrical Cardioversion uses a jolt of electricity to your heart either through paddles or wired patches attached to your chest. This is a controlled, usually prescheduled, procedure. Defibrillation is done under light anesthesia in the hospital, and you usually go home the day of the procedure. This is done to get your heart back into a normal rhythm. You are not awake for the procedure. Please see the instructions below located under "other instructions".    Follow-Up: At [facility], you and your health needs are our priority.  As part of our continuing mission to provide you with exceptional heart care, we have created designated Provider Care Teams.  These Care Teams include your primary Cardiologist (physician) and Advanced Practice Providers (APPs -  Physician Assistants and Nurse Practitioners) who all work together to provide you with the care you need, when you need it.   Your next appointment:   1 month(s)   The format for your next appointment:   In Person  Provider:   You will follow up in the [facility] located at [facility]. Your provider will be: Roderic Palau, NP or Clint R. Fenton, PA-C    Your physician recommends that you schedule a follow-up  appointment in: 6 months with [name]   Thank you for choosing [facility]!!   [name], RN [phone]   Other Instructions    You are scheduled for a Cardioversion on 03/24/2021 with Dr. Johney Frame.  Please arrive at the [facility] (Main Entrance A) at [facility]: [address] [place], [place] [address] at 9:30 am  DIET: Nothing to eat or drink after midnight except a sip of water with medications (see medication instructions below)  FYI: For your safety, and to allow [place] to monitor your vital signs accurately during the surgery/procedure we request that   if you have artificial nails, gel coating, SNS etc. Please have those removed prior to your surgery/procedure. Not having the nail coverings /polish removed may result in cancellation or delay of your surgery/procedure.   Medication Instructions:  Take 1/2 your bedtime Lantus the night before this procedure  Hold the following medications the morning of:    1) Torsemide   2) Spironolactone   3) Flomax  Continue your anticoagulant: Xarelto You will need to continue your anticoagulant after your procedure until you are told by your provider that it is safe to stop   Labs: 03/16/21  You must have a responsible person to drive you home and stay in the waiting area during your procedure. Failure to do so could result in cancellation.  Bring your insurance cards.  *Special Note: Every effort is made to have your procedure done on time. Occasionally there are emergencies that occur at the hospital that may  cause delays. Please be patient if a delay does occur.

## 2021-03-16 NOTE — Telephone Encounter (Signed)
Rx corrected and resent. Pt appreciates the help with this.

## 2021-03-16 NOTE — H&P (View-Only) (Signed)
Electrophysiology Office Note   Date:  03/16/2021   ID:  Mark Singh, DOB 1945-09-14, MRN [id number]  PCP:  Kathyrn Lass, MD  Cardiologist:  Marlou Porch Primary Electrophysiologist:  Myer Bohlman Meredith Leeds, MD    No chief complaint on file.    History of Present Illness: Mark Singh is a 75 y.o. male who presents today for electrophysiology evaluation.     He has a history significant for PVCs, nephrotic syndrome, diabetes, hypertension, hyperlipidemia, morbid obesity, pulmonary hypertension.  He is status post PVC ablation.  PVCs were inferior to the left coronary cusp.  Ablation was performed on 03/30/2015.  He subsequently went into atrial fibrillation. He had an ablation for atrial fibrillation 12/13/2017.  He then presented in atrial flutter and is status post ablation 02/26/2018.  Today, denies symptoms of palpitations, chest pain, orthopnea, PND, lower extremity edema, claudication, dizziness, presyncope, syncope, bleeding, or neurologic sequela. The patient is tolerating medications without difficulties.  He has been having more shortness of breath and fatigue.  States that he went into atrial fibrillation in September.  He has found no indication as to why he went into atrial fibrillation.  He states that he has had some difficulty with prolonged exertion.  He would like to get back into normal rhythm.  Past Medical History:  Diagnosis Date   Arthritis    "hands, feet" (12/13/2017)   Asthma    Bradycardia    a. Repoted h/o HR in the 40s.   Edema    a. 2D echo 11/2013: EF 62-95%, LV diastolic function parameters were normal, severely dilated LA, PASP 38mmHg..   H/O: gout    right foot; on daily RX" (12/13/2017)   HTN (hypertension)    Hyperlipidemia    Myalgia and myositis, unspecified    Nephrotic syndrome    a. responder to steroid therapy by Dr. Lorrene Reid.    Obesity    Orthostatic hypotension    Penile cancer ([name])    "did circ"   Pulmonary hypertension  ([place])    a. Mild pulm HTN felt 2/2 obesity.   PVC's (premature ventricular contractions)    a. Holter 05/2013 showing 18k PVCs (18% of the time).   Seasonal asthma    mild   Shoulder pain    Squamous carcinoma    "right hand; left shoulder" (12/13/2017)   Type 2 diabetes mellitus ([place])    Past Surgical History:  Procedure Laterality Date   A-FLUTTER ABLATION N/A 02/23/2018   Procedure: A-FLUTTER ABLATION;  Surgeon: Constance Haw, MD;  Location: [place] [facility];  Service: Cardiovascular;  Laterality: N/A;   ATRIAL FIBRILLATION ABLATION N/A 12/13/2017   Procedure: ATRIAL FIBRILLATION ABLATION;  Surgeon: Constance Haw, MD;  Location: [place];  Service: Cardiovascular;  Laterality: N/A;   BUNIONECTOMY WITH HAMMERTOE RECONSTRUCTION Left 1992  &  2008   REMOVAL HEAL SPUR/ BUNIONECTOMY AND HAMMERTOE REPAIR    CARDIOVERSION N/A 07/22/2015   Procedure: CARDIOVERSION;  Surgeon: Dorothy Spark, MD;  Location: [facility];  Service: Cardiovascular;  Laterality: N/A;   CARDIOVERSION N/A 10/06/2015   Procedure: CARDIOVERSION;  Surgeon: Sueanne Margarita, MD;  Location: [facility];  Service: Cardiovascular;  Laterality: N/A;   CARDIOVERSION N/A 12/05/2016   Procedure: CARDIOVERSION;  Surgeon: Pixie Casino, MD;  Location: [place];  Service: Cardiovascular;  Laterality: N/A;   CARDIOVERSION N/A 06/29/2018   Procedure: CARDIOVERSION;  Surgeon: [name] Wonda Cheng, MD;  Location: [place];  Service: Cardiovascular;  Laterality: N/A;  CARDIOVERSION N/A 09/20/2018   Procedure: CARDIOVERSION;  Surgeon: [name], MD;  Location: [facility];  Service: Cardiovascular;  Laterality: N/A;   CIRCUMCISION N/A 11/27/2012   Procedure:  CIRCUMCISION  AND EXCISION OF GLANS PENIS;  Surgeon: Fredricka Bonine, MD;  Location: [facility];  Service: Urology;  Laterality: N/A;   ELECTROPHYSIOLOGIC STUDY N/A 03/30/2015   Procedure: PVC Ablation;  Surgeon: Lakynn Halvorsen  Meredith Leeds, MD;  Location: [facility];  Service: Cardiovascular;  Laterality: N/A;   HERNIA REPAIR     SQUAMOUS CELL CARCINOMA EXCISION     "right hand; left shoulder" (5/64/3329)   UMBILICAL HERNIA REPAIR  11-04-2005     Current Outpatient Medications  Medication Sig Dispense Refill   ALLOPURINOL PO Take 50 mg by mouth daily.     amiodarone (PACERONE) 100 MG tablet Take 1 tablet (100 mg total) by mouth every other day. 90 tablet 3   amLODipine (NORVASC) 10 MG tablet TAKE 1 TABLET BY MOUTH EVERY DAY 90 tablet 1   atorvastatin (LIPITOR) 40 MG tablet Take 40 mg by mouth daily.     BD PEN NEEDLE NANO 2ND GEN 32G X 4 MM MISC      betamethasone valerate ointment (VALISONE) 0.1 % APPLY TO AFFECTED AREA TWICE A DAY     Cholecalciferol (VITAMIN D3) 5000 units CAPS Take 5,000 Units by mouth daily.      cloNIDine (CATAPRES) 0.1 MG tablet Take 1 tablet (0.1 mg total) by mouth daily. (03-02-20)ONCE A DAY FOR 5 DAYS THEN EVERY OTHER FOR  5 DAYS THEN STOP  ONLY IF BLOOD PRESSURE IS BELOW 140/80 CONSISTENTLY) (Patient taking differently: Take 0.1 mg by mouth daily. (03-02-20)ONCE A DAY FOR 5 DAYS THEN EVERY OTHER FOR  5 DAYS THEN STOP  ONLY IF BLOOD PRESSURE IS BELOW 140/80 CONSISTENTLY)) 60 tablet 11   Cyanocobalamin (VITAMIN B12) 3000 MCG SUBL Take 3,000 mcg by mouth daily.     hydrALAZINE (APRESOLINE) 50 MG tablet TAKE 1 TABLET (50 MG TOTAL) BY MOUTH IN THE MORNING AND AT BEDTIME. 180 tablet 2   KLOR-CON M20 20 MEQ tablet TAKE 1 TABLET BY MOUTH EVERY DAY 90 tablet 3   LANTUS SOLOSTAR 100 UNIT/ML Solostar Pen Inject 40 Units into the skin at bedtime.   0   losartan (COZAAR) 100 MG tablet TAKE 1 TABLET BY MOUTH EVERY DAY 90 tablet 2   omeprazole (PRILOSEC) 20 MG capsule Take 20 mg by mouth daily.     OneTouch Delica Lancets 51O MISC USE AS DIRECTED EVERY DAY     ONETOUCH VERIO test strip USE AS DIRECTED (TEST DAILY) 90 **E11.9     pimecrolimus (ELIDEL) 1 % cream Apply 1 application topically 2  (two) times daily as needed (psoriasis).      probenecid (BENEMID) 500 MG tablet Take 500 mg by mouth daily.      Rivaroxaban (XARELTO) 15 MG TABS tablet Take 1 tablet (15 mg total) by mouth daily with supper. 90 tablet 1   spironolactone (ALDACTONE) 25 MG tablet Take 0.5 tablets (12.5 mg total) by mouth daily. 45 tablet 3   tamsulosin (FLOMAX) 0.4 MG CAPS capsule Take 0.4 mg by mouth every evening.   4   torsemide (DEMADEX) 20 MG tablet TAKE 3 TABLETS BY MOUTH EVERY DAY 270 tablet 3   WIXELA INHUB 100-50 MCG/DOSE AEPB Inhale 2 puffs into the lungs daily as needed.     No current facility-administered medications for this visit.    Allergies:  Indomethacin   Social History:  The patient  reports that he has never smoked. He has never used smokeless tobacco. He reports that he does not drink alcohol and does not use drugs.   Family History:  The patient's family history includes CVA in his father; Coronary artery disease in his mother.   ROS:  Please see the history of present illness.   Otherwise, review of systems is positive for none.   All other systems are reviewed and negative.   PHYSICAL EXAM: VS:  BP 130/68   Pulse 68   Resp 18   Ht 5\' 9"  (1.753 m)   Wt 272 lb (123.4 kg)   SpO2 97%   BMI 40.17 kg/m  , BMI Body mass index is 40.17 kg/m. GEN: Well nourished, well developed, in no acute distress  HEENT: normal  Neck: no JVD, carotid bruits, or masses Cardiac: Irregular; no murmurs, rubs, or gallops,no edema  Respiratory:  clear to auscultation bilaterally, normal work of breathing GI: soft, nontender, nondistended, + BS MS: no deformity or atrophy  Skin: warm and dry Neuro:  Strength and sensation are intact Psych: euthymic mood, full affect  EKG:  EKG is ordered today. Personal review of the ekg ordered shows atrial fibrillation, rate 68  Recent Labs: 05/21/2020: BUN 21; Creatinine, Ser 2.17; Potassium 4.7; Sodium 141 11/25/2020: TSH 4.720    Lipid Panel  No  results found for: CHOL, TRIG, HDL, CHOLHDL, VLDL, LDLCALC, LDLDIRECT   Wt Readings from Last 3 Encounters:  03/16/21 272 lb (123.4 kg)  10/12/20 260 lb (117.9 kg)  09/15/20 263 lb (119.3 kg)      Other studies Reviewed: Additional studies/ records that were reviewed today include: TTE 12/28/14  Review of the above records today demonstrates:  - Left ventricle: The cavity size was normal. There was mild   concentric hypertrophy. Systolic function was normal. The   estimated ejection fraction was in the range of 55% to 60%.   Although no diagnostic regional wall motion abnormality was   identified, this possibility cannot be completely excluded on the   basis of this study. Left ventricular diastolic function   parameters were normal. - Left atrium: The atrium was severely dilated. - Pulmonary arteries: Systolic pressure was mildly increased. PA   peak pressure: 38 mm Hg (S).   ASSESSMENT AND PLAN:  1.  PVCs: Status post ablation 03/10/2015.  No obvious recurrence.  Continue with current management.  2.  Persistent atrial fibrillation: Status post ablation 12/23/2017. Currently on Xarelto 15 mg daily, amiodarone 100 mg every other day.  High risk medication monitoring for amiodarone via labs and ECG.  CHA2DS2-VASc of 3.  Unfortunately he is in atrial fibrillation today.  Due to that, we Yaniv Lage increase his amiodarone to 100 mg daily.  We Ashlan Dignan also plan for cardioversion to get him back into normal rhythm.  3.  Typical atrial flutter: Status post ablation 02/26/2018.  No obvious recurrence.  4.  Chronic diastolic heart failure: No obvious volume overload.  5.  Stage 45 CKD: Followed by nephrology.  Continue torsemide  6.  Hypertension: Currently well controlled.  Recent blood pressure recordings from home 130/80.  7.  Obstructive sleep apnea: CPAP compliance encouraged  Current medicines are reviewed at length with the patient today.   The patient does not have concerns regarding  his medicines.  The following changes were made today: Increase amiodarone  Labs/ tests ordered today include:  Orders Placed This Encounter  Procedures  EKG 12-Lead      Disposition:   FU with Demara Lover 6 months  Signed, Jacquez Sheetz Meredith Leeds, MD  03/16/2021 10:20 AM     [facility] [address] [place] [address] [phone] (office) [phone] (fax)

## 2021-03-16 NOTE — Telephone Encounter (Signed)
Pt c/o medication issue:  1. Name of Medication: amiodarone (PACERONE) 200 MG tablet  2. How are you currently taking this medication (dosage and times per day)? Not currently taking   3. Are you having a reaction (difficulty breathing--STAT)? No   4. What is your medication issue? AVS says 100 MG's pharmacy filled 200 MG's. Requesting callback for confirmation on how to take medication.

## 2021-03-20 ENCOUNTER — Other Ambulatory Visit: Payer: Self-pay | Admitting: Cardiology

## 2021-03-24 ENCOUNTER — Encounter (HOSPITAL_COMMUNITY)
Admission: RE | Disposition: A | Discharge status: Home / Self Care | Payer: Self-pay | Source: Home / Self Care | Attending: Cardiology

## 2021-03-24 ENCOUNTER — Other Ambulatory Visit: Payer: Self-pay

## 2021-03-24 ENCOUNTER — Ambulatory Visit (HOSPITAL_COMMUNITY): Payer: Medicare PPO | Admitting: Anesthesiology

## 2021-03-24 ENCOUNTER — Ambulatory Visit (HOSPITAL_COMMUNITY)
Admission: RE | Admit: 2021-03-24 | Discharge: 2021-03-24 | Disposition: A | Discharge status: Home / Self Care | Payer: Medicare PPO | Attending: Cardiology | Admitting: Cardiology

## 2021-03-24 DIAGNOSIS — Z79899 Other long term (current) drug therapy: Secondary | ICD-10-CM | POA: Insufficient documentation

## 2021-03-24 DIAGNOSIS — I4819 Other persistent atrial fibrillation: Secondary | ICD-10-CM | POA: Insufficient documentation

## 2021-03-24 DIAGNOSIS — G4733 Obstructive sleep apnea (adult) (pediatric): Secondary | ICD-10-CM | POA: Diagnosis not present

## 2021-03-24 DIAGNOSIS — I272 Pulmonary hypertension, unspecified: Secondary | ICD-10-CM | POA: Diagnosis not present

## 2021-03-24 DIAGNOSIS — N189 Chronic kidney disease, unspecified: Secondary | ICD-10-CM | POA: Insufficient documentation

## 2021-03-24 DIAGNOSIS — Z6841 Body Mass Index (BMI) 40.0 and over, adult: Secondary | ICD-10-CM | POA: Insufficient documentation

## 2021-03-24 DIAGNOSIS — I130 Hypertensive heart and chronic kidney disease with heart failure and stage 1 through stage 4 chronic kidney disease, or unspecified chronic kidney disease: Secondary | ICD-10-CM | POA: Diagnosis not present

## 2021-03-24 DIAGNOSIS — I951 Orthostatic hypotension: Secondary | ICD-10-CM | POA: Diagnosis not present

## 2021-03-24 DIAGNOSIS — I483 Typical atrial flutter: Secondary | ICD-10-CM | POA: Insufficient documentation

## 2021-03-24 DIAGNOSIS — I493 Ventricular premature depolarization: Secondary | ICD-10-CM | POA: Insufficient documentation

## 2021-03-24 DIAGNOSIS — E1122 Type 2 diabetes mellitus with diabetic chronic kidney disease: Secondary | ICD-10-CM | POA: Insufficient documentation

## 2021-03-24 DIAGNOSIS — Z7901 Long term (current) use of anticoagulants: Secondary | ICD-10-CM | POA: Insufficient documentation

## 2021-03-24 DIAGNOSIS — E785 Hyperlipidemia, unspecified: Secondary | ICD-10-CM | POA: Insufficient documentation

## 2021-03-24 DIAGNOSIS — I480 Paroxysmal atrial fibrillation: Secondary | ICD-10-CM | POA: Diagnosis not present

## 2021-03-24 HISTORY — PX: CARDIOVERSION: SHX1299

## 2021-03-24 LAB — GLUCOSE, CAPILLARY: Glucose-Capillary: 84 mg/dL (ref 70–99)

## 2021-03-24 SURGERY — CARDIOVERSION
Anesthesia: General

## 2021-03-24 MED ORDER — SODIUM CHLORIDE 0.9 % IV SOLN: INTRAVENOUS | Status: DC

## 2021-03-24 MED ORDER — SODIUM CHLORIDE 0.9 % IV SOLN
INTRAVENOUS | Status: AC | PRN
  Administered 2021-03-24: 500 mL via INTRAVENOUS

## 2021-03-24 MED ORDER — LIDOCAINE 2% (20 MG/ML) 5 ML SYRINGE
INTRAMUSCULAR | Status: DC | PRN
  Administered 2021-03-24: 100 mg via INTRAVENOUS

## 2021-03-24 MED ORDER — PROPOFOL 10 MG/ML IV BOLUS
INTRAVENOUS | Status: DC | PRN
  Administered 2021-03-24: 80 mg via INTRAVENOUS

## 2021-03-24 NOTE — Procedures (Signed)
Procedure: Electrical Cardioversion Indications:  Atrial Fibrillation  Procedure Details:  Consent: Risks of procedure as well as the alternatives and risks of each were explained to the (patient/caregiver).  Consent for procedure obtained.  Time Out: Verified patient identification, verified procedure, site/side was marked, verified correct patient position, special equipment/implants available, medications/allergies/relevent history reviewed, required imaging and test results available. PERFORMED.  Patient placed on cardiac monitor, pulse oximetry, supplemental oxygen as necessary.  Sedation given:  Propofol 80mg ; lidocaine 100mg  Pacer pads placed anterior and posterior chest.  Cardioverted 2 time(s).  Cardioversion with synchronized biphasic 200J shock.  Evaluation: Findings: Post procedure EKG shows: NSR Complications: None Patient did tolerate procedure well.  Time Spent Directly with the Patient:  42minutes   Freada Bergeron 03/24/2021, 10:02 AM

## 2021-03-24 NOTE — Transfer of Care (Signed)
Immediate Anesthesia Transfer of Care Note  Patient: Mark Singh  Procedure(s) Performed: CARDIOVERSION  Patient Location: Endoscopy Unit  Anesthesia Type:General  Level of Consciousness: awake and alert   Airway & Oxygen Therapy: Patient Spontanous Breathing  Post-op Assessment: Report given to RN and Post -op Vital signs reviewed and stable  Post vital signs: Reviewed and stable  Last Vitals:  Vitals Value Taken Time  BP    Temp    Pulse    Resp    SpO2      Last Pain:  Vitals:   03/24/21 0941  PainSc: 0-No pain         Complications: No notable events documented.

## 2021-03-24 NOTE — Anesthesia Procedure Notes (Signed)
Procedure Name: General with mask airway Date/Time: 03/24/2021 9:58 AM Performed by: Ardyth Harps, CRNA Pre-anesthesia Checklist: Patient identified, Emergency Drugs available, Patient being monitored, Suction available and Timeout performed Patient Re-evaluated:Patient Re-evaluated prior to induction Oxygen Delivery Method: Ambu bag Preoxygenation: Pre-oxygenation with 100% oxygen Induction Type: IV induction Dental Injury: Teeth and Oropharynx as per pre-operative assessment

## 2021-03-24 NOTE — Interval H&P Note (Signed)
History and Physical Interval Note:  03/24/2021 9:43 AM  Mark Singh  has presented today for surgery, with the diagnosis of AFIB.  The various methods of treatment have been discussed with the patient and family. After consideration of risks, benefits and other options for treatment, the patient has consented to  Procedure(s): CARDIOVERSION (N/A) as a surgical intervention.  The patient's history has been reviewed, patient examined, no change in status, stable for surgery.  I have reviewed the patient's chart and labs.  Questions were answered to the patient's satisfaction.     Freada Bergeron

## 2021-03-24 NOTE — Anesthesia Preprocedure Evaluation (Signed)
Anesthesia Evaluation  Patient identified by MRN, date of birth, ID band Patient awake    Reviewed: Allergy & Precautions, NPO status , Patient's Chart, lab work & pertinent test results, reviewed documented beta blocker date and time   History of Anesthesia Complications Negative for: history of anesthetic complications  Airway Mallampati: II  TM Distance: >3 FB Neck ROM: Full    Dental  (+) Chipped,    Pulmonary asthma ,    breath sounds clear to auscultation       Cardiovascular hypertension, Pt. on home beta blockers and Pt. on medications + dysrhythmias Atrial Fibrillation  Rhythm:Irregular Rate:Normal   '17 ETT - There was no ST segment deviation noted during stress. There were no adverse arrhythmias during exercise. QRS did not widen. Isolated PVC noted Low risk flecainide exercise challenge  '15 TTE - mildconcentric LVH. EF 55% to 60%. Severely dilated LA. PASP was mildly increased. PApeak pressure: 38 mmHg   Neuro/Psych negative neurological ROS  negative psych ROS   GI/Hepatic negative GI ROS, Neg liver ROS,   Endo/Other  diabetes, Type 2, Insulin Dependent, Oral Hypoglycemic AgentsMorbid obesity  Renal/GU CRFRenal disease    Penile cancer     Musculoskeletal  (+) Arthritis ,  Gout    Abdominal (+) + obese,   Peds  Hematology  (+) anemia , HLD   Anesthesia Other Findings   Reproductive/Obstetrics                             Anesthesia Physical  Anesthesia Plan  ASA: 3  Anesthesia Plan: General   Post-op Pain Management:    Induction: Intravenous  PONV Risk Score and Plan: 2 and Treatment may vary due to age or medical condition and Propofol infusion  Airway Management Planned: Natural Airway and Mask  Additional Equipment: None  Intra-op Plan:   Post-operative Plan:   Informed Consent: I have reviewed the patients History and Physical, chart, labs  and discussed the procedure including the risks, benefits and alternatives for the proposed anesthesia with the patient or authorized representative who has indicated his/her understanding and acceptance.       Plan Discussed with: CRNA and Anesthesiologist  Anesthesia Plan Comments:         Anesthesia Quick Evaluation

## 2021-03-24 NOTE — Anesthesia Postprocedure Evaluation (Signed)
Anesthesia Post Note  Patient: Mark Singh  Procedure(s) Performed: CARDIOVERSION     Patient location during evaluation: PACU Anesthesia Type: General Level of consciousness: sedated Pain management: pain level controlled Vital Signs Assessment: post-procedure vital signs reviewed and stable Respiratory status: spontaneous breathing and respiratory function stable Cardiovascular status: stable Postop Assessment: no apparent nausea or vomiting Anesthetic complications: no   No notable events documented.  Last Vitals:  Vitals:   03/24/21 1019 03/24/21 1029  BP: (!) 102/34 (!) 106/36  Pulse: 60 62  Resp: 15 14  Temp:    SpO2: 96% 96%    Last Pain:  Vitals:   03/24/21 1009  TempSrc: Temporal  PainSc:                  Mark Singh

## 2021-03-26 ENCOUNTER — Encounter (HOSPITAL_COMMUNITY): Payer: Self-pay | Admitting: Cardiology

## 2021-03-27 ENCOUNTER — Other Ambulatory Visit: Payer: Self-pay | Admitting: Cardiology

## 2021-03-29 ENCOUNTER — Ambulatory Visit: Payer: Medicare PPO | Admitting: Cardiology

## 2021-03-29 ENCOUNTER — Other Ambulatory Visit: Payer: Self-pay

## 2021-03-29 ENCOUNTER — Encounter: Payer: Self-pay | Admitting: Cardiology

## 2021-03-29 VITALS — BP 118/62 | HR 58 | Ht 69.0 in | Wt 268.8 lb

## 2021-03-29 DIAGNOSIS — I493 Ventricular premature depolarization: Secondary | ICD-10-CM | POA: Diagnosis not present

## 2021-03-29 DIAGNOSIS — R0989 Other specified symptoms and signs involving the circulatory and respiratory systems: Secondary | ICD-10-CM

## 2021-03-29 DIAGNOSIS — Z6841 Body Mass Index (BMI) 40.0 and over, adult: Secondary | ICD-10-CM

## 2021-03-29 DIAGNOSIS — R609 Edema, unspecified: Secondary | ICD-10-CM

## 2021-03-29 DIAGNOSIS — I480 Paroxysmal atrial fibrillation: Secondary | ICD-10-CM | POA: Diagnosis not present

## 2021-03-29 DIAGNOSIS — N184 Chronic kidney disease, stage 4 (severe): Secondary | ICD-10-CM | POA: Diagnosis not present

## 2021-03-29 DIAGNOSIS — Z7901 Long term (current) use of anticoagulants: Secondary | ICD-10-CM | POA: Diagnosis not present

## 2021-03-29 NOTE — Assessment & Plan Note (Signed)
On torsemide.

## 2021-03-29 NOTE — Assessment & Plan Note (Signed)
Prior PVC ablation took place.  Reviewed from EP note.

## 2021-03-29 NOTE — Assessment & Plan Note (Signed)
Ablation of fibrillation as well as flutter in 2019.  Went back into atrial fibrillation but was successfully cardioverted in early November 2022.  Maintaining sinus rhythm currently on amiodarone low-dose 100 mg.  No changes made.  Prior EKGs reviewed.

## 2021-03-29 NOTE — Progress Notes (Signed)
Cardiology Office Note:    Date:  03/29/2021   ID:  Mark Singh, DOB February 16, 1946, MRN [id number]  PCP:  Kathyrn Lass, MD   [facility] Providers Cardiologist:  [name], MD Electrophysiologist:  [name], MD     Referring MD: Kathyrn Lass, MD   History of Present Illness:    Mark Singh is a 75 y.o. male here for the follow-up of atrial fibrillation and hypertension.  Underwent cardioversion x2 on 03/24/2021. Post procedure EKG showed NSR.  Had a PVC ablation inferior to the left coronary cusp on 03/28/2015.  He subsequently had atrial fibrillation status post ablation on 12/13/2017 had atrial flutter and had an ablation on 02/26/2018 for that.  [name] is his EP.    At his last appointment he was overall doing quite well except for fatigue.  No further episodes of A. fib or atrial flutter.  He started CPAP a little over a year ago.  Struggling with this however. Not having the chest pain.  No further palpitations.  Today: Last week he underwent a cardioversion. He is feeling well today, better in normal rhythm. When he stands up he does not feel as dizzy.   He is unable to tell when he is in afib, and generally checks his smart watch. No recent indications of atrial fibrillation.  He reports that he is still collecting fluid, but his edema has improved somewhat since last week. He has lost about 4 lbs. Also, he tried joining a weight loss program but was unable to continue because of potential medication interactions.  He denies any palpitations, chest pain, or shortness of breath. No headaches, syncope, orthopnea, PND, or exertional symptoms.   Past Medical History:  Diagnosis Date   Arthritis    "hands, feet" (12/13/2017)   Asthma    Bradycardia    a. Repoted h/o HR in the 40s.   Edema    a. 2D echo 11/2013: EF 43-32%, LV diastolic function parameters were normal, severely dilated LA, PASP 13mmHg..   H/O: gout    right foot; on  daily RX" (12/13/2017)   HTN (hypertension)    Hyperlipidemia    Myalgia and myositis, unspecified    Nephrotic syndrome    a. responder to steroid therapy by Dr. Lorrene Reid.    Obesity    Orthostatic hypotension    Penile cancer ([place])    "did circ"   Pulmonary hypertension ([place])    a. Mild pulm HTN felt 2/2 obesity.   PVC's (premature ventricular contractions)    a. Holter 05/2013 showing 18k PVCs (18% of the time).   Seasonal asthma    mild   Shoulder pain    Squamous carcinoma    "right hand; left shoulder" (12/13/2017)   Type 2 diabetes mellitus ([place])     Past Surgical History:  Procedure Laterality Date   A-FLUTTER ABLATION N/A 02/23/2018   Procedure: A-FLUTTER ABLATION;  Surgeon: Constance Haw, MD;  Location: [place] [facility];  Service: Cardiovascular;  Laterality: N/A;   ATRIAL FIBRILLATION ABLATION N/A 12/13/2017   Procedure: ATRIAL FIBRILLATION ABLATION;  Surgeon: Constance Haw, MD;  Location: [place] [facility];  Service: Cardiovascular;  Laterality: N/A;   BUNIONECTOMY WITH HAMMERTOE RECONSTRUCTION Left 1992  &  2008   REMOVAL HEAL SPUR/ BUNIONECTOMY AND HAMMERTOE REPAIR    CARDIOVERSION N/A 07/22/2015   Procedure: CARDIOVERSION;  Surgeon: Dorothy Spark, MD;  Location: [place];  Service: Cardiovascular;  Laterality: N/A;   CARDIOVERSION  N/A 10/06/2015   Procedure: CARDIOVERSION;  Surgeon: Sueanne Margarita, MD;  Location: [place];  Service: Cardiovascular;  Laterality: N/A;   CARDIOVERSION N/A 12/05/2016   Procedure: CARDIOVERSION;  Surgeon: Pixie Casino, MD;  Location: [place];  Service: Cardiovascular;  Laterality: N/A;   CARDIOVERSION N/A 06/29/2018   Procedure: CARDIOVERSION;  Surgeon: [name] Wonda Cheng, MD;  Location: [facility];  Service: Cardiovascular;  Laterality: N/A;   CARDIOVERSION N/A 09/20/2018   Procedure: CARDIOVERSION;  Surgeon: [name], MD;  Location: [place];  Service: Cardiovascular;  Laterality: N/A;    CARDIOVERSION N/A 03/24/2021   Procedure: CARDIOVERSION;  Surgeon: Freada Bergeron, MD;  Location: [place];  Service: Cardiovascular;  Laterality: N/A;   CIRCUMCISION N/A 11/27/2012   Procedure:  CIRCUMCISION  AND EXCISION OF GLANS PENIS;  Surgeon: Fredricka Bonine, MD;  Location: [facility];  Service: Urology;  Laterality: N/A;   ELECTROPHYSIOLOGIC STUDY N/A 03/30/2015   Procedure: PVC Ablation;  Surgeon: [name], MD;  Location: [place] [facility];  Service: Cardiovascular;  Laterality: N/A;   HERNIA REPAIR     SQUAMOUS CELL CARCINOMA EXCISION     "right hand; left shoulder" (2/72/5366)   UMBILICAL HERNIA REPAIR  11-04-2005    Current Medications: Current Meds  Medication Sig   acetaminophen (TYLENOL) 500 MG tablet Take 1,000 mg by mouth every 6 (six) hours as needed for moderate pain.   allopurinol (ZYLOPRIM) 100 MG tablet Take 50 mg by mouth every other day.   amiodarone (PACERONE) 100 MG tablet Take 1 tablet (100 mg total) by mouth daily.   amLODipine (NORVASC) 10 MG tablet TAKE 1 TABLET BY MOUTH EVERY DAY   atorvastatin (LIPITOR) 40 MG tablet Take 40 mg by mouth daily.   BD PEN NEEDLE NANO 2ND GEN 32G X 4 MM MISC    betamethasone valerate ointment (VALISONE) 0.1 % 1 application 2 (two) times daily as needed (precancerous spots).   Cyanocobalamin (B-12 PO) Take 1 capsule by mouth daily.   Dulaglutide (TRULICITY) 1.5 YQ/0.3KV SOPN Inject 1.5 mg into the skin every Wednesday.   hydrALAZINE (APRESOLINE) 50 MG tablet TAKE 1 TABLET (50 MG TOTAL) BY MOUTH IN THE MORNING AND AT BEDTIME.   KLOR-CON M20 20 MEQ tablet TAKE 1 TABLET BY MOUTH EVERY DAY   LANTUS SOLOSTAR 100 UNIT/ML Solostar Pen Inject 42 Units into the skin at bedtime.   losartan (COZAAR) 50 MG tablet Take 50 mg by mouth daily.   omeprazole (PRILOSEC) 20 MG capsule Take 20 mg by mouth daily.   OneTouch Delica Lancets 42V MISC USE AS DIRECTED EVERY DAY   ONETOUCH VERIO test strip USE  AS DIRECTED (TEST DAILY) 90 **E11.9   oxymetazoline (AFRIN) 0.05 % nasal spray Place 1 spray into both nostrils 2 (two) times daily as needed for congestion.   pimecrolimus (ELIDEL) 1 % cream Apply 1 application topically 2 (two) times daily as needed (psoriasis).    Rivaroxaban (XARELTO) 15 MG TABS tablet Take 1 tablet (15 mg total) by mouth daily with supper.   spironolactone (ALDACTONE) 25 MG tablet Take 0.5 tablets (12.5 mg total) by mouth daily.   tamsulosin (FLOMAX) 0.4 MG CAPS capsule Take 0.4 mg by mouth every evening.    torsemide (DEMADEX) 20 MG tablet TAKE 3 TABLETS BY MOUTH EVERY DAY   VITAMIN D PO Take 1 capsule by mouth daily.   WIXELA INHUB 100-50 MCG/DOSE AEPB Inhale 1 puff into the lungs 2 (two) times daily.   [DISCONTINUED]  losartan (COZAAR) 50 MG tablet TAKE 2 TABLETS BY MOUTH EVERY DAY     Allergies:   Indomethacin   Social History   Socioeconomic History   Marital status: Widowed    Spouse name: Not on file   Number of children: Not on file   Years of education: Not on file   Highest education level: Not on file  Occupational History   Not on file  Tobacco Use   Smoking status: Never   Smokeless tobacco: Never  Vaping Use   Vaping Use: Never used  Substance and Sexual Activity   Alcohol use: Never   Drug use: Never   Sexual activity: Not Currently  Other Topics Concern   Not on file  Social History Narrative   Not on file   Social Determinants of Health   Financial Resource Strain: Not on file  Food Insecurity: Not on file  Transportation Needs: Not on file  Physical Activity: Not on file  Stress: Not on file  Social Connections: Not on file     Family History: The patient's family history includes CVA in his father; Coronary artery disease in his mother.  ROS:   Please see the history of present illness.    (+) LE edema (+) Dizziness All other systems reviewed and are negative.  EKGs/Labs/Other Studies Reviewed:    The following studies  were reviewed today:  Echo 06/18/2019:  1. Left ventricular ejection fraction, by estimation, is 60 to 65%. The  left ventricle has normal function. The left ventricle has no regional  wall motion abnormalities. The left ventricular internal cavity size was  mildly dilated. There is moderate  concentric left ventricular hypertrophy. Left ventricular diastolic  parameters are indeterminate.   2. Right ventricular systolic function is normal. The right ventricular  size is severely enlarged. There is mildly elevated pulmonary artery  systolic pressure. The estimated right ventricular systolic pressure is  93.8 mmHg.   3. Left atrial size was moderately dilated.   4. Right atrial size was moderately dilated.   5. The mitral valve is normal in structure and function. Trivial mitral  valve regurgitation. No evidence of mitral stenosis.   6. Tricuspid valve regurgitation is mild to moderate.   7. The aortic valve is tricuspid. Aortic valve regurgitation is not  visualized. Mild aortic valve sclerosis is present, with no evidence of  aortic valve stenosis.   8. The inferior vena cava is dilated in size with <50% respiratory  variability, suggesting right atrial pressure of 15 mmHg.  A-Flutter Ablation 02/23/2018:  CONCLUSIONS:   1. Isthmus-dependent right atrial flutter upon presentation.   2. Successful radiofrequency ablation of atrial flutter along the cavotricuspid isthmus with complete bidirectional isthmus block achieved.   3. No inducible arrhythmias following ablation.   4. No early apparent complications.   Atrial Fibrillation Ablation 12/13/2017: CONCLUSIONS: 1. Atrial fibrillation upon presentation.   2. Successful electrical isolation and anatomical encircling of all four pulmonary veins with radiofrequency current.  A WACA approach was used 3. Additional left atrial ablation was performed across the roof of the left atrium 4. Atrial fibrillation successfully cardioverted to  sinus rhythm. 5. No early apparent complications.  Cardiac CTA 12/08/2017: There is normal pulmonary vein drainage into the left atrium (2 on the right and 2 on the left) with ostial measurements as follows:   RUPV: 24.1 x 17.2 mm   RLPV: 16.7 x 15.2 mm   LUPV: 20.2 x 13.1 mm   LLPV: 19.1 x 14.8  mm   The left atrial appendage is medium size with chicken wing morphology and no evidence for a thrombus.   The esophagus runs in the left atrial midline and is not in the proximity to any of the pulmonary veins.   Aorta: Normal caliber. No dissection, mild diffuse calcifications and atheroma.   Aortic Valve:  Trileaflet.  Trivial calcifications.   Coronary Arteries: Normal coronary origin. The study was performed without use of NTG and insufficient for plaque evaluation.   IMPRESSION: 1. There is normal pulmonary vein drainage into the left atrium. No stenosis.   2. The left atrial appendage is medium size with chicken wing morphology and no evidence for a thrombus.   3. The esophagus runs in the left atrial midline and is not in the proximity to any of the pulmonary veins.  Monitor 11/2016: Paroxysmal atrial flutter/fibrillation with variable rate. (23 of 30 days of monitor in AFIB/flutter) Avg. HR 62bpm, no significant pauses) Post cardioversion. Current flecainide use.   Will forward to Tommye Standard, [place]. Goal from last office note was to try to maintain sinus rhythm. Has seen [name] in the past.   ETT 10/20/2015: There was no ST segment deviation noted during stress. There were no adverse arrhythmias during exercise. QRS did not widen. Isolated PVC noted Low risk flecainide exercise challenge  Echocardiogram 12/28/2014-EF 55 to 60%  EKG:  EKG is personally reviewed and interpreted. 03/29/2021: EKG was not ordered.  Recent Labs: 11/25/2020: TSH 4.720 03/16/2021: ALT 17; BUN 33; Creatinine, Ser 2.95; Hemoglobin 11.2; Platelets 318; Potassium 4.7; Sodium 138   Recent  Lipid Panel No results found for: CHOL, TRIG, HDL, CHOLHDL, VLDL, LDLCALC, LDLDIRECT   Risk Assessment/Calculations:      Physical Exam:    VS:  BP 118/62   Pulse (!) 58   Ht 5\' 9"  (1.753 m)   Wt 268 lb 12.8 oz (121.9 kg)   SpO2 95%   BMI 39.69 kg/m     Wt Readings from Last 3 Encounters:  03/29/21 268 lb 12.8 oz (121.9 kg)  03/24/21 272 lb 0.8 oz (123.4 kg)  03/16/21 272 lb (123.4 kg)     GEN:  Well nourished, well developed in no acute distress HEENT: Normal NECK: No JVD; +carotid bruit B LYMPHATICS: No lymphadenopathy CARDIAC: RRR, no murmurs, rubs, gallops RESPIRATORY:  Clear to auscultation without rales, wheezing or rhonchi  ABDOMEN: Soft, non-tender, non-distended MUSCULOSKELETAL:  No edema; No deformity  SKIN: Warm and dry NEUROLOGIC:  Alert and oriented x 3 PSYCHIATRIC:  Normal affect   ASSESSMENT:    1. Carotid bruit, unspecified laterality   2. Paroxysmal atrial fibrillation (HCC)   3. PVC (premature ventricular contraction)   4. Chronic kidney disease, stage IV (severe) ([place])   5. Current use of long term anticoagulation   6. BMI 40.0-44.9, adult ([place])   7. Edema, unspecified type     PLAN:    In order of problems listed above:  Paroxysmal atrial fibrillation ([place]) Ablation of fibrillation as well as flutter in 2019.  Went back into atrial fibrillation but was successfully cardioverted in early November 2022.  Maintaining sinus rhythm currently on amiodarone low-dose 100 mg.  No changes made.  Prior EKGs reviewed.  PVC (premature ventricular contraction) Prior PVC ablation took place.  Reviewed from EP note.  Chronic kidney disease, stage IV (severe) ([place]) Sees nephrology.  Low-protein diet.  Currently taking torsemide 60 mg.  Also taking spironolactone 12.5 mg a day.  Potassium 4.7.  Creatinine  approximately 3.  Fluid balance has been challenging.  Current use of long term anticoagulation Currently on Xarelto dose adjusted 15 mg daily.  No  signs of bleeding.  At some point, may wish to convert over to Eliquis given his renal disease.  BMI 40.0-44.9, adult [facility]) Has been a challenge for him.  Low carbohydrates encouraged.  Did go to 1 weight loss facility but they did tell him that he would have to drink too much fluid given his renal disease, high-protein diet, also stimulants which can affect his atrial fibrillation.  He is currently on the waiting list for healthy weight and wellness here at [facility].  Edema On torsemide.  6 month f/u  Medication Adjustments/Labs and Tests Ordered: Current medicines are reviewed at length with the patient today.  Concerns regarding medicines are outlined above.   Orders Placed This Encounter  Procedures   VAS US CAROTID    No orders of the defined types were placed in this encounter.  Patient Instructions  Medication Instructions:  The current medical regimen is effective;  continue present plan and medications.  *If you need a refill on your cardiac medications before your next appointment, please call your pharmacy*  Testing/Procedures: Your physician has requested that you have a carotid duplex. This test is an ultrasound of the carotid arteries in your neck. It looks at blood flow through these arteries that supply the brain with blood. Allow one hour for this exam. There are no restrictions or special instructions.  Follow-Up: At [facility], you and your health needs are our priority.  As part of our continuing mission to provide you with exceptional heart care, we have created designated Provider Care Teams.  These Care Teams include your primary Cardiologist (physician) and Advanced Practice Providers (APPs -  Physician Assistants and Nurse Practitioners) who all work together to provide you with the care you need, when you need it.  We recommend signing up for the patient portal called "MyChart".  Sign up information is provided on this After Visit Summary.  MyChart is used to  connect with patients for Virtual Visits (Telemedicine).  Patients are able to view lab/test results, encounter notes, upcoming appointments, etc.  Non-urgent messages can be sent to your provider as well.   To learn more about what you can do with MyChart, go to [email or web address].    Your next appointment:   1 year(s)  The format for your next appointment:   In Person  Provider:   [name], MD    Thank you for choosing [facility]!!     I,Mathew Stumpf,acting as a scribe for [name], MD.,have documented all relevant documentation on the behalf of [name], MD,as directed by  [name], MD while in the presence of [name], MD.  I, [name], MD, have reviewed all documentation for this visit. The documentation on 03/29/21 for the exam, diagnosis, procedures, and orders are all accurate and complete.   Signed, [name], MD  03/29/2021 10:08 AM    [facility]

## 2021-03-29 NOTE — Assessment & Plan Note (Signed)
Has been a challenge for him.  Low carbohydrates encouraged.  Did go to 1 weight loss facility but they did tell him that he would have to drink too much fluid given his renal disease, high-protein diet, also stimulants which can affect his atrial fibrillation.  He is currently on the waiting list for healthy weight and wellness here at [facility].

## 2021-03-29 NOTE — Assessment & Plan Note (Signed)
Currently on Xarelto dose adjusted 15 mg daily.  No signs of bleeding.  At some point, may wish to convert over to Eliquis given his renal disease.

## 2021-03-29 NOTE — Assessment & Plan Note (Signed)
Sees nephrology.  Low-protein diet.  Currently taking torsemide 60 mg.  Also taking spironolactone 12.5 mg a day.  Potassium 4.7.  Creatinine approximately 3.  Fluid balance has been challenging.

## 2021-03-29 NOTE — Patient Instructions (Signed)
Medication Instructions:  The current medical regimen is effective;  continue present plan and medications.  *If you need a refill on your cardiac medications before your next appointment, please call your pharmacy*  Testing/Procedures: Your physician has requested that you have a carotid duplex. This test is an ultrasound of the carotid arteries in your neck. It looks at blood flow through these arteries that supply the brain with blood. Allow one hour for this exam. There are no restrictions or special instructions.  Follow-Up: At [facility], you and your health needs are our priority.  As part of our continuing mission to provide you with exceptional heart care, we have created designated Provider Care Teams.  These Care Teams include your primary Cardiologist (physician) and Advanced Practice Providers (APPs -  Physician Assistants and Nurse Practitioners) who all work together to provide you with the care you need, when you need it.  We recommend signing up for the patient portal called "MyChart".  Sign up information is provided on this After Visit Summary.  MyChart is used to connect with patients for Virtual Visits (Telemedicine).  Patients are able to view lab/test results, encounter notes, upcoming appointments, etc.  Non-urgent messages can be sent to your provider as well.   To learn more about what you can do with MyChart, go to [email or web address].    Your next appointment:   1 year(s)  The format for your next appointment:   In Person  Provider:   [name], MD    Thank you for choosing [facility]!!

## 2021-03-31 ENCOUNTER — Other Ambulatory Visit: Payer: Self-pay

## 2021-03-31 ENCOUNTER — Ambulatory Visit (HOSPITAL_COMMUNITY)
Admission: RE | Admit: 2021-03-31 | Discharge: 2021-03-31 | Disposition: A | Discharge status: Home / Self Care | Payer: Medicare PPO | Source: Ambulatory Visit | Attending: Cardiology | Admitting: Cardiology

## 2021-03-31 DIAGNOSIS — R0989 Other specified symptoms and signs involving the circulatory and respiratory systems: Secondary | ICD-10-CM | POA: Diagnosis not present

## 2021-04-02 ENCOUNTER — Telehealth: Payer: Self-pay | Admitting: *Deleted

## 2021-04-02 DIAGNOSIS — Z79899 Other long term (current) drug therapy: Secondary | ICD-10-CM

## 2021-04-02 NOTE — Telephone Encounter (Signed)
-----   Message from Jerline Pain, MD sent at 04/02/2021  6:54 AM EST ----- Right Carotid: Velocities in the right ICA are consistent with a 1-39% stenosis.  Left Carotid: Velocities in the left ICA are consistent with a 1-39% stenosis.  Vertebrals:  Bilateral vertebral arteries demonstrate antegrade flow.  Subclavians: Right subclavian artery was stenotic. Right subclavian artery flow was disturbed.   Normal flow hemodynamics were seen in the left subclavian artery.  Mild plaque in the carotid arteries.  Right subclavian artery stenotic.  Asymptomatic.  Continue with medical management.  Continue with atorvastatin 40 mg.  Lets check a lipid panel with ALT to make sure we are at goal LDL less than 40.  [name], MD

## 2021-04-05 ENCOUNTER — Other Ambulatory Visit: Payer: Medicare PPO | Admitting: *Deleted

## 2021-04-05 ENCOUNTER — Other Ambulatory Visit: Payer: Self-pay

## 2021-04-05 DIAGNOSIS — Z79899 Other long term (current) drug therapy: Secondary | ICD-10-CM | POA: Diagnosis not present

## 2021-04-05 LAB — LIPID PANEL
Chol/HDL Ratio: 2.5 ratio (ref 0.0–5.0)
Cholesterol, Total: 119 mg/dL (ref 100–199)
HDL: 48 mg/dL (ref >39)
LDL Chol Calc (NIH): 55 mg/dL (ref 0–99)
Triglycerides: 82 mg/dL (ref 0–149)
VLDL Cholesterol Cal: 16 mg/dL (ref 5–40)

## 2021-04-05 LAB — ALT: ALT: 18 IU/L (ref 0–44)

## 2021-04-06 DIAGNOSIS — E1122 Type 2 diabetes mellitus with diabetic chronic kidney disease: Secondary | ICD-10-CM | POA: Diagnosis not present

## 2021-04-06 DIAGNOSIS — N184 Chronic kidney disease, stage 4 (severe): Secondary | ICD-10-CM | POA: Diagnosis not present

## 2021-04-06 DIAGNOSIS — I4819 Other persistent atrial fibrillation: Secondary | ICD-10-CM | POA: Diagnosis not present

## 2021-04-06 DIAGNOSIS — I129 Hypertensive chronic kidney disease with stage 1 through stage 4 chronic kidney disease, or unspecified chronic kidney disease: Secondary | ICD-10-CM | POA: Diagnosis not present

## 2021-04-06 DIAGNOSIS — E785 Hyperlipidemia, unspecified: Secondary | ICD-10-CM | POA: Diagnosis not present

## 2021-04-08 NOTE — Progress Notes (Signed)
Pt has been made aware of normal result and verbalized understanding.  jw

## 2021-04-14 DIAGNOSIS — E1122 Type 2 diabetes mellitus with diabetic chronic kidney disease: Secondary | ICD-10-CM | POA: Diagnosis not present

## 2021-04-14 DIAGNOSIS — M109 Gout, unspecified: Secondary | ICD-10-CM | POA: Diagnosis not present

## 2021-04-14 DIAGNOSIS — N184 Chronic kidney disease, stage 4 (severe): Secondary | ICD-10-CM | POA: Diagnosis not present

## 2021-04-15 ENCOUNTER — Encounter (HOSPITAL_COMMUNITY): Payer: Self-pay | Admitting: Physician Assistant

## 2021-04-15 ENCOUNTER — Other Ambulatory Visit: Payer: Self-pay

## 2021-04-15 ENCOUNTER — Ambulatory Visit (HOSPITAL_COMMUNITY)
Admission: RE | Admit: 2021-04-15 | Discharge: 2021-04-15 | Disposition: A | Discharge status: Home / Self Care | Payer: Medicare PPO | Source: Ambulatory Visit | Attending: Physician Assistant | Admitting: Physician Assistant

## 2021-04-15 VITALS — BP 120/56 | HR 61 | Ht 69.0 in | Wt 266.6 lb

## 2021-04-15 DIAGNOSIS — Z9989 Dependence on other enabling machines and devices: Secondary | ICD-10-CM | POA: Insufficient documentation

## 2021-04-15 DIAGNOSIS — I5032 Chronic diastolic (congestive) heart failure: Secondary | ICD-10-CM | POA: Insufficient documentation

## 2021-04-15 DIAGNOSIS — Z6839 Body mass index (BMI) 39.0-39.9, adult: Secondary | ICD-10-CM | POA: Diagnosis not present

## 2021-04-15 DIAGNOSIS — R9431 Abnormal electrocardiogram [ECG] [EKG]: Secondary | ICD-10-CM | POA: Insufficient documentation

## 2021-04-15 DIAGNOSIS — Z79899 Other long term (current) drug therapy: Secondary | ICD-10-CM | POA: Insufficient documentation

## 2021-04-15 DIAGNOSIS — I4892 Unspecified atrial flutter: Secondary | ICD-10-CM | POA: Insufficient documentation

## 2021-04-15 DIAGNOSIS — Z7182 Exercise counseling: Secondary | ICD-10-CM | POA: Diagnosis not present

## 2021-04-15 DIAGNOSIS — E669 Obesity, unspecified: Secondary | ICD-10-CM | POA: Insufficient documentation

## 2021-04-15 DIAGNOSIS — G4733 Obstructive sleep apnea (adult) (pediatric): Secondary | ICD-10-CM | POA: Diagnosis not present

## 2021-04-15 DIAGNOSIS — D6869 Other thrombophilia: Secondary | ICD-10-CM | POA: Diagnosis not present

## 2021-04-15 DIAGNOSIS — I440 Atrioventricular block, first degree: Secondary | ICD-10-CM | POA: Insufficient documentation

## 2021-04-15 DIAGNOSIS — I110 Hypertensive heart disease with heart failure: Secondary | ICD-10-CM | POA: Diagnosis not present

## 2021-04-15 DIAGNOSIS — Z7901 Long term (current) use of anticoagulants: Secondary | ICD-10-CM | POA: Diagnosis not present

## 2021-04-15 DIAGNOSIS — I4819 Other persistent atrial fibrillation: Secondary | ICD-10-CM | POA: Diagnosis not present

## 2021-04-15 NOTE — Progress Notes (Signed)
Primary Care Physician: Kathyrn Lass, MD Primary Cardiologist: Dr Marlou Porch Primary Electrophysiologist: Dr Curt Bears Referring Physician: Dr Melanee Left is a 75 y.o. male with a history of persistent atrial fibrillation who presents for follow up in the [facility]. Patient reports doing reasonably well since his last visit. He states that his SOB has persisted since starting flecainide. He did not feel much different in rhythm. He does still try to walk on a regular basis. No symptoms of palpitations or heart racing. S/p DCCV 09/20/18. He reports that he had some bradycardia and dizziness. His metoprolol was decreased. On 09/28/18 he was at his PCP office and he reports fluid in his abdomen and legs with increased SOB. Started on Lasix with symptomatic relief. He is on Xarelto for a CHADS2VASC score of 3.  On follow up today, patient is s/p DCCV 03/24/21. He reports he had been in afib for 2 months prior with symptoms of fluid overload and SOB. He states he is feeling much better back in SR. He denies any bleeding issues on anticoagulation.   Today, he denies symptoms of palpitations, chest pain, orthopnea, PND, dizziness, presyncope, syncope, bleeding, or neurologic sequela. The patient is tolerating medications without difficulties and is otherwise without complaint today.    Atrial Fibrillation Risk Factors:  he does not have symptoms or diagnosis of sleep apnea. he does not have a history of rheumatic fever. he does not have a history of alcohol use.  he has a BMI of Body mass index is 39.37 kg/m.[name] Filed Weights   04/15/21 0955  Weight: 120.9 kg     Family History  Problem Relation Age of Onset   Coronary artery disease Mother    CVA Father      Atrial Fibrillation Management history:  Previous antiarrhythmic drugs: flecainide, amiodarone  Previous cardioversion: several, most recently 09/20/18, 03/24/21 Previous ablations:  11/2017, 01/2018 CHADS2VASC score: 3 Anticoagulation history: Xarelto   Past Medical History:  Diagnosis Date   Arthritis    "hands, feet" (12/13/2017)   Asthma    Bradycardia    a. Repoted h/o HR in the 40s.   Edema    a. 2D echo 11/2013: EF 50-53%, LV diastolic function parameters were normal, severely dilated LA, PASP 17mmHg..   H/O: gout    right foot; on daily RX" (12/13/2017)   HTN (hypertension)    Hyperlipidemia    Myalgia and myositis, unspecified    Nephrotic syndrome    a. responder to steroid therapy by Dr. Lorrene Reid.    Obesity    Orthostatic hypotension    Penile cancer ([place])    "did circ"   Pulmonary hypertension ([place])    a. Mild pulm HTN felt 2/2 obesity.   PVC's (premature ventricular contractions)    a. Holter 05/2013 showing 18k PVCs (18% of the time).   Seasonal asthma    mild   Shoulder pain    Squamous carcinoma    "right hand; left shoulder" (12/13/2017)   Type 2 diabetes mellitus ([place])    Past Surgical History:  Procedure Laterality Date   A-FLUTTER ABLATION N/A 02/23/2018   Procedure: A-FLUTTER ABLATION;  Surgeon: Constance Haw, MD;  Location: [place] [facility];  Service: Cardiovascular;  Laterality: N/A;   ATRIAL FIBRILLATION ABLATION N/A 12/13/2017   Procedure: ATRIAL FIBRILLATION ABLATION;  Surgeon: Constance Haw, MD;  Location: [place] [facility];  Service: Cardiovascular;  Laterality: N/A;   BUNIONECTOMY WITH HAMMERTOE RECONSTRUCTION  Left 1992  &  2008   REMOVAL HEAL SPUR/ BUNIONECTOMY AND HAMMERTOE REPAIR    CARDIOVERSION N/A 07/22/2015   Procedure: CARDIOVERSION;  Surgeon: Dorothy Spark, MD;  Location: [place];  Service: Cardiovascular;  Laterality: N/A;   CARDIOVERSION N/A 10/06/2015   Procedure: CARDIOVERSION;  Surgeon: Sueanne Margarita, MD;  Location: [place];  Service: Cardiovascular;  Laterality: N/A;   CARDIOVERSION N/A 12/05/2016   Procedure: CARDIOVERSION;  Surgeon: Pixie Casino, MD;  Location: [place];   Service: Cardiovascular;  Laterality: N/A;   CARDIOVERSION N/A 06/29/2018   Procedure: CARDIOVERSION;  Surgeon: Thayer Headings, MD;  Location: [place];  Service: Cardiovascular;  Laterality: N/A;   CARDIOVERSION N/A 09/20/2018   Procedure: CARDIOVERSION;  Surgeon: [name], MD;  Location: [place];  Service: Cardiovascular;  Laterality: N/A;   CARDIOVERSION N/A 03/24/2021   Procedure: CARDIOVERSION;  Surgeon: Freada Bergeron, MD;  Location: [place];  Service: Cardiovascular;  Laterality: N/A;   CIRCUMCISION N/A 11/27/2012   Procedure:  CIRCUMCISION  AND EXCISION OF GLANS PENIS;  Surgeon: Fredricka Bonine, MD;  [facility];  Service: Urology;  Laterality: N/A;   ELECTROPHYSIOLOGIC STUDY N/A 03/30/2015   Procedure: PVC Ablation;  Surgeon: [name], MD;  Location: [place] [facility];  Service: Cardiovascular;  Laterality: N/A;   HERNIA REPAIR     SQUAMOUS CELL CARCINOMA EXCISION     "right hand; left shoulder" (9/62/9528)   UMBILICAL HERNIA REPAIR  11-04-2005    Current Outpatient Medications  Medication Sig Dispense Refill   acetaminophen (TYLENOL) 500 MG tablet Take 1,000 mg by mouth every 6 (six) hours as needed for moderate pain.     allopurinol (ZYLOPRIM) 100 MG tablet Take 100 mg by mouth every other day.     amiodarone (PACERONE) 100 MG tablet Take 1 tablet (100 mg total) by mouth daily. 90 tablet 2   amLODipine (NORVASC) 10 MG tablet TAKE 1 TABLET BY MOUTH EVERY DAY 90 tablet 1   atorvastatin (LIPITOR) 40 MG tablet Take 40 mg by mouth daily.     BD PEN NEEDLE NANO 2ND GEN 32G X 4 MM MISC      betamethasone valerate ointment (VALISONE) 0.1 % 1 application 2 (two) times daily as needed (precancerous spots).     Cyanocobalamin (B-12 PO) Take 1 capsule by mouth daily.     Dulaglutide (TRULICITY) 1.5 UX/3.2GM SOPN Inject 1.5 mg into the skin every Wednesday.     hydrALAZINE (APRESOLINE) 50 MG tablet TAKE 1 TABLET BY  MOUTH IN THE MORNING AND AT BEDTIME. 180 tablet 3   KLOR-CON M20 20 MEQ tablet TAKE 1 TABLET BY MOUTH EVERY DAY 90 tablet 3   LANTUS SOLOSTAR 100 UNIT/ML Solostar Pen Inject 42 Units into the skin at bedtime.  0   losartan (COZAAR) 50 MG tablet Take 50 mg by mouth daily.     omeprazole (PRILOSEC) 20 MG capsule Take 20 mg by mouth daily.     OneTouch Delica Lancets 01U MISC USE AS DIRECTED EVERY DAY     ONETOUCH VERIO test strip USE AS DIRECTED (TEST DAILY) 90 **E11.9     oxymetazoline (AFRIN) 0.05 % nasal spray Place 1 spray into both nostrils 2 (two) times daily as needed for congestion.     pimecrolimus (ELIDEL) 1 % cream Apply 1 application topically 2 (two) times daily as needed (psoriasis).      Rivaroxaban (XARELTO) 15 MG TABS tablet Take 1 tablet (15 mg total)  by mouth daily with supper. 90 tablet 1   spironolactone (ALDACTONE) 25 MG tablet Take 0.5 tablets (12.5 mg total) by mouth daily. 45 tablet 3   tamsulosin (FLOMAX) 0.4 MG CAPS capsule Take 0.4 mg by mouth every evening.   4   torsemide (DEMADEX) 20 MG tablet TAKE 3 TABLETS BY MOUTH EVERY DAY 270 tablet 3   VITAMIN D PO Take 1 capsule by mouth daily.     WIXELA INHUB 100-50 MCG/DOSE AEPB Inhale 1 puff into the lungs 2 (two) times daily.     No current facility-administered medications for this encounter.    Allergies  Allergen Reactions   Indomethacin Other (See Comments)    dizziness    Social History   Socioeconomic History   Marital status: Widowed    Spouse name: Not on file   Number of children: Not on file   Years of education: Not on file   Highest education level: Not on file  Occupational History   Not on file  Tobacco Use   Smoking status: Never   Smokeless tobacco: Never  Vaping Use   Vaping Use: Never used  Substance and Sexual Activity   Alcohol use: Never   Drug use: Never   Sexual activity: Not Currently  Other Topics Concern   Not on file  Social History Narrative   Not on file   Social  Determinants of Health   Financial Resource Strain: Not on file  Food Insecurity: Not on file  Transportation Needs: Not on file  Physical Activity: Not on file  Stress: Not on file  Social Connections: Not on file  Intimate Partner Violence: Not on file     ROS- All systems are reviewed and negative except as per the HPI above.  Physical Exam: Vitals:   04/15/21 0955  BP: (!) 120/56  Pulse: 61  Weight: 120.9 kg  Height: 5\' 9"  (1.753 m)    GEN- The patient is a well appearing elderly obese male, alert and oriented x 3 today.   HEENT-head normocephalic, atraumatic, sclera clear, conjunctiva pink, hearing intact, trachea midline. Lungs- Clear to ausculation bilaterally, normal work of breathing Heart- Regular rate and rhythm, no murmurs, rubs or gallops  GI- soft, NT, ND, + BS Extremities- no clubbing, cyanosis, or edema MS- no significant deformity or atrophy Skin- no rash or lesion Psych- euthymic mood, full affect Neuro- strength and sensation are intact   Wt Readings from Last 3 Encounters:  04/15/21 120.9 kg  03/29/21 121.9 kg  03/24/21 123.4 kg    EKG today demonstrates  SR, 1st degree AV block Vent. rate 61 BPM PR interval 218 ms QRS duration 94 ms QT/QTcB 484/487 ms  Echo 06/18/19 demonstrated   1. Left ventricular ejection fraction, by estimation, is 60 to 65%. The  left ventricle has normal function. The left ventricle has no regional  wall motion abnormalities. The left ventricular internal cavity size was mildly dilated. There is moderate  concentric left ventricular hypertrophy. Left ventricular diastolic  parameters are indeterminate.   2. Right ventricular systolic function is normal. The right ventricular  size is severely enlarged. There is mildly elevated pulmonary artery  systolic pressure. The estimated right ventricular systolic pressure is  41.3 mmHg.   3. Left atrial size was moderately dilated.   4. Right atrial size was moderately  dilated.   5. The mitral valve is normal in structure and function. Trivial mitral  valve regurgitation. No evidence of mitral stenosis.   6. Tricuspid  valve regurgitation is mild to moderate.   7. The aortic valve is tricuspid. Aortic valve regurgitation is not  visualized. Mild aortic valve sclerosis is present, with no evidence of  aortic valve stenosis.   8. The inferior vena cava is dilated in size with <50% respiratory  variability, suggesting right atrial pressure of 15 mmHg.   Epic records are reviewed at length today  Assessment and Plan:  1. Persistent atrial fibrillation/tpical atrial flutter S/p DCCV 03/24/21 Patient appears to be maintaining SR.  Continue amiodarone 100 mg daily Continue Xarelto 15 mg daily  This patients CHA2DS2-VASc Score and unadjusted Ischemic Stroke Rate (% per year) is equal to 3.2 % stroke rate/year from a score of 3  Above score calculated as 1 point each if present [CHF, HTN, DM, Vascular=MI/PAD/Aortic Plaque, Age if 65-74, or Male] Above score calculated as 2 points each if present [Age > 75, or Stroke/TIA/TE]  2. Obesity Body mass index is 39.37 kg/m. Lifestyle modification was discussed and encouraged including regular physical activity and weight reduction.  3. HTN Stable, no changes today.   4. OSA Followed by Dr Isidoro Donning. Patient reports compliance with CPAP therapy.  5. Chronic diastolic CHF Patient appears euvolemic today. He does have significant fluid overload associated with his afib.    Follow up in the AF clinic in 3 months.    [facility] [address] [place], [place] [address] [phone] 04/15/2021 10:26 AM

## 2021-04-21 ENCOUNTER — Telehealth: Payer: Self-pay | Admitting: Cardiology

## 2021-04-21 NOTE — Telephone Encounter (Signed)
Spoke with Sharee Pimple at Dr [facility] office.  Dr Sabra Heck was asking about pt's kidney function for his 03/16/2020 lab and if the results had been reviewed with the patient.  Advised lab was ordered by Dr Curt Bears prior to a procedure.  Results were released into MyChart for his review and routed to Dr Sabra Heck for f/u as instructed by Dr Curt Bears.  Sharee Pimple reports understanding and will notify Dr Sabra Heck.  She had no further questions at the end of the call.

## 2021-04-21 NOTE — Telephone Encounter (Signed)
Dr. Irma Newness office called and wanted to see if Dr. Marlou Porch has looked over patient's lab work and if it was discussed.

## 2021-04-29 ENCOUNTER — Other Ambulatory Visit: Payer: Self-pay | Admitting: Cardiology

## 2021-04-30 DIAGNOSIS — E785 Hyperlipidemia, unspecified: Secondary | ICD-10-CM | POA: Diagnosis not present

## 2021-04-30 DIAGNOSIS — K219 Gastro-esophageal reflux disease without esophagitis: Secondary | ICD-10-CM | POA: Diagnosis not present

## 2021-04-30 DIAGNOSIS — N1831 Chronic kidney disease, stage 3a: Secondary | ICD-10-CM | POA: Diagnosis not present

## 2021-04-30 DIAGNOSIS — I5032 Chronic diastolic (congestive) heart failure: Secondary | ICD-10-CM | POA: Diagnosis not present

## 2021-04-30 DIAGNOSIS — J45909 Unspecified asthma, uncomplicated: Secondary | ICD-10-CM | POA: Diagnosis not present

## 2021-04-30 DIAGNOSIS — I272 Pulmonary hypertension, unspecified: Secondary | ICD-10-CM | POA: Diagnosis not present

## 2021-04-30 DIAGNOSIS — I480 Paroxysmal atrial fibrillation: Secondary | ICD-10-CM | POA: Diagnosis not present

## 2021-04-30 DIAGNOSIS — I129 Hypertensive chronic kidney disease with stage 1 through stage 4 chronic kidney disease, or unspecified chronic kidney disease: Secondary | ICD-10-CM | POA: Diagnosis not present

## 2021-04-30 DIAGNOSIS — E1122 Type 2 diabetes mellitus with diabetic chronic kidney disease: Secondary | ICD-10-CM | POA: Diagnosis not present

## 2021-05-04 DIAGNOSIS — E785 Hyperlipidemia, unspecified: Secondary | ICD-10-CM | POA: Diagnosis not present

## 2021-05-04 DIAGNOSIS — J45909 Unspecified asthma, uncomplicated: Secondary | ICD-10-CM | POA: Diagnosis not present

## 2021-05-04 DIAGNOSIS — I5032 Chronic diastolic (congestive) heart failure: Secondary | ICD-10-CM | POA: Diagnosis not present

## 2021-05-04 DIAGNOSIS — I10 Essential (primary) hypertension: Secondary | ICD-10-CM | POA: Diagnosis not present

## 2021-05-04 DIAGNOSIS — N184 Chronic kidney disease, stage 4 (severe): Secondary | ICD-10-CM | POA: Diagnosis not present

## 2021-05-04 DIAGNOSIS — I272 Pulmonary hypertension, unspecified: Secondary | ICD-10-CM | POA: Diagnosis not present

## 2021-05-04 DIAGNOSIS — E1122 Type 2 diabetes mellitus with diabetic chronic kidney disease: Secondary | ICD-10-CM | POA: Diagnosis not present

## 2021-05-04 DIAGNOSIS — I480 Paroxysmal atrial fibrillation: Secondary | ICD-10-CM | POA: Diagnosis not present

## 2021-05-04 DIAGNOSIS — I129 Hypertensive chronic kidney disease with stage 1 through stage 4 chronic kidney disease, or unspecified chronic kidney disease: Secondary | ICD-10-CM | POA: Diagnosis not present

## 2021-05-18 ENCOUNTER — Other Ambulatory Visit: Payer: Self-pay | Admitting: Cardiology

## 2021-05-27 DIAGNOSIS — C609 Malignant neoplasm of penis, unspecified: Secondary | ICD-10-CM | POA: Diagnosis not present

## 2021-06-01 ENCOUNTER — Other Ambulatory Visit: Payer: Self-pay | Admitting: Cardiology

## 2021-06-01 NOTE — Telephone Encounter (Signed)
Pt last saw Clint Fenton, PA on 04/15/21, last labs 03/16/21 Creat 2.95, age 76, weight 120.9kg, CrCl 36.43, based on CrCl pt is on appropriate dosage of Xarelto 15mg  QD for afib.  Will refill rx.

## 2021-07-14 DIAGNOSIS — Z794 Long term (current) use of insulin: Secondary | ICD-10-CM | POA: Diagnosis not present

## 2021-07-14 DIAGNOSIS — E1122 Type 2 diabetes mellitus with diabetic chronic kidney disease: Secondary | ICD-10-CM | POA: Diagnosis not present

## 2021-07-14 DIAGNOSIS — M109 Gout, unspecified: Secondary | ICD-10-CM | POA: Diagnosis not present

## 2021-07-15 ENCOUNTER — Ambulatory Visit (HOSPITAL_COMMUNITY)
Admission: RE | Admit: 2021-07-15 | Discharge: 2021-07-15 | Disposition: A | Discharge status: Home / Self Care | Payer: Medicare PPO | Source: Ambulatory Visit | Attending: Physician Assistant | Admitting: Physician Assistant

## 2021-07-15 ENCOUNTER — Other Ambulatory Visit: Payer: Self-pay

## 2021-07-15 ENCOUNTER — Encounter (HOSPITAL_COMMUNITY): Payer: Self-pay | Admitting: Physician Assistant

## 2021-07-15 VITALS — BP 122/42 | HR 64 | Ht 69.0 in | Wt 257.0 lb

## 2021-07-15 DIAGNOSIS — Z79899 Other long term (current) drug therapy: Secondary | ICD-10-CM | POA: Diagnosis not present

## 2021-07-15 DIAGNOSIS — Z7901 Long term (current) use of anticoagulants: Secondary | ICD-10-CM | POA: Insufficient documentation

## 2021-07-15 DIAGNOSIS — D6869 Other thrombophilia: Secondary | ICD-10-CM | POA: Insufficient documentation

## 2021-07-15 DIAGNOSIS — I493 Ventricular premature depolarization: Secondary | ICD-10-CM | POA: Diagnosis not present

## 2021-07-15 DIAGNOSIS — Z6837 Body mass index (BMI) 37.0-37.9, adult: Secondary | ICD-10-CM | POA: Insufficient documentation

## 2021-07-15 DIAGNOSIS — I4892 Unspecified atrial flutter: Secondary | ICD-10-CM | POA: Insufficient documentation

## 2021-07-15 DIAGNOSIS — I5022 Chronic systolic (congestive) heart failure: Secondary | ICD-10-CM | POA: Insufficient documentation

## 2021-07-15 DIAGNOSIS — I440 Atrioventricular block, first degree: Secondary | ICD-10-CM | POA: Diagnosis not present

## 2021-07-15 DIAGNOSIS — G4733 Obstructive sleep apnea (adult) (pediatric): Secondary | ICD-10-CM | POA: Insufficient documentation

## 2021-07-15 DIAGNOSIS — R9431 Abnormal electrocardiogram [ECG] [EKG]: Secondary | ICD-10-CM | POA: Insufficient documentation

## 2021-07-15 DIAGNOSIS — E669 Obesity, unspecified: Secondary | ICD-10-CM | POA: Diagnosis not present

## 2021-07-15 DIAGNOSIS — I4819 Other persistent atrial fibrillation: Secondary | ICD-10-CM | POA: Insufficient documentation

## 2021-07-15 DIAGNOSIS — Z9989 Dependence on other enabling machines and devices: Secondary | ICD-10-CM | POA: Insufficient documentation

## 2021-07-15 DIAGNOSIS — I110 Hypertensive heart disease with heart failure: Secondary | ICD-10-CM | POA: Diagnosis not present

## 2021-07-15 NOTE — Progress Notes (Signed)
? ? ?Primary Care Physician: Kathyrn Lass, MD ?Primary Cardiologist: Dr Marlou Porch ?Primary Electrophysiologist: Dr Curt Bears ?Referring Physician: Dr Curt Bears ? ? ?Mark Singh is a 76 y.o. male with a history of persistent atrial fibrillation who presents for follow up in the [facility]. S/p DCCV 09/20/18. He reports that he had some bradycardia and dizziness. His metoprolol was decreased. On 09/28/18 he was at his PCP office and he reports fluid in his abdomen and legs with increased SOB. Started on Lasix with symptomatic relief. He is on Xarelto for a CHADS2VASC score of 4. Patient is s/p DCCV 03/24/21.  ? ?On follow up today, patient reports that he has done well since his last visit. He denies any palpitations or fluid retention. He does have some positional dizziness when going from sitting to standing which resolves after a few seconds. He brings in a BP log from home which shows excellent BP control. No bleeding issues on anticoagulation.  ? ?Today, he denies symptoms of palpitations, chest pain, orthopnea, PND, presyncope, syncope, bleeding, or neurologic sequela. The patient is tolerating medications without difficulties and is otherwise without complaint today.  ? ? ?Atrial Fibrillation Risk Factors: ? ?he does not have symptoms or diagnosis of sleep apnea. ?he does not have a history of rheumatic fever. ?he does not have a history of alcohol use. ? ?he has a BMI of Body mass index is 37.95 kg/m?Marland KitchenMarland Kitchen ?Filed Weights  ? 07/15/21 0948  ?Weight: 116.6 kg  ? ? ? ? ?Family History  ?Problem Relation Age of Onset  ? Coronary artery disease Mother   ? CVA Father   ? ? ? ?Atrial Fibrillation Management history: ? ?Previous antiarrhythmic drugs: flecainide, amiodarone  ?Previous cardioversion: several, most recently 09/20/18, 03/24/21 ?Previous ablations: 11/2017, 01/2018 ?CHADS2VASC score: 4 ?Anticoagulation history: Xarelto ? ? ?Past Medical History:  ?Diagnosis Date  ? Arthritis   ?  "hands, feet" (12/13/2017)  ? Asthma   ? Bradycardia   ? a. Repoted h/o HR in the 40s.  ? Edema   ? a. 2D echo 11/2013: EF 77-41%, LV diastolic function parameters were normal, severely dilated LA, PASP 72mHg..  ? H/O: gout   ? right foot; on daily RX" (12/13/2017)  ? HTN (hypertension)   ? Hyperlipidemia   ? Myalgia and myositis, unspecified   ? Nephrotic syndrome   ? a. responder to steroid therapy by Dr. DLorrene Reid   ? Obesity   ? Orthostatic hypotension   ? [place]ile [place] ([place]   ? "did circ"  ? [place]   ? a. Mild pulm HTN felt 2/2 obesity.  ? PVC's (premature ventricular contractions)   ? a. Holter 05/2013 showing 18k PVCs (18% of the time).  ? Seasonal asthma   ? mild  ? Shoulder pain   ? Squamous carcinoma   ? "right hand; left shoulder" (12/13/2017)  ? [place] 2 diabetes mellitus ([place]   ? ?Past Surgical History:  ?Procedure Laterality Date  ? A-FLUTTER ABLATION N/A 02/23/2018  ? Procedure: A-FLUTTER ABLATION;  Surgeon: CConstance Haw MD;  [facility];  Service: Cardiovascular;  Laterality: N/A;  ? ATRIAL FIBRILLATION ABLATION N/A 12/13/2017  ? Procedure: ATRIAL FIBRILLATION ABLATION;  Surgeon: CConstance Haw MD;  [facility];  Service: Cardiovascular;  Laterality: N/A;  ? BUNIONECTOMY WITH HAMMERTOE RECONSTRUCTION Left 1992  &  2008  ? REMOVAL HEAL SPUR/ BUNIONECTOMY AND HAMMERTOE REPAIR   ? CARDIOVERSION N/A 07/22/2015  ? Procedure:  CARDIOVERSION;  Surgeon: Dorothy Spark, MD;  Location: [place];  Service: Cardiovascular;  Laterality: N/A;  ? CARDIOVERSION N/A 10/06/2015  ? Procedure: CARDIOVERSION;  Surgeon: Sueanne Margarita, MD;  Location: [place];  Service: Cardiovascular;  Laterality: N/A;  ? CARDIOVERSION N/A 12/05/2016  ? Procedure: CARDIOVERSION;  Surgeon: Pixie Casino, MD;  Location: [place];  Service: Cardiovascular;  Laterality: N/A;  ? CARDIOVERSION N/A 06/29/2018  ? Procedure: CARDIOVERSION;  Surgeon: Thayer Headings, MD;   Location: [place];  Service: Cardiovascular;  Laterality: N/A;  ? CARDIOVERSION N/A 09/20/2018  ? Procedure: CARDIOVERSION;  Surgeon: [name], MD;  Location: [place];  Service: Cardiovascular;  Laterality: N/A;  ? CARDIOVERSION N/A 03/24/2021  ? Procedure: CARDIOVERSION;  Surgeon: Freada Bergeron, MD;  [facility];  Service: Cardiovascular;  Laterality: N/A;  ? CIRCUMCISION N/A 11/27/2012  ? Procedure:  CIRCUMCISION  AND EXCISION OF GLANS PENIS;  Surgeon: Fredricka Bonine, MD;  Location: [facility];  Service: Urology;  Laterality: N/A;  ? ELECTROPHYSIOLOGIC STUDY N/A 03/30/2015  ? Procedure: PVC Ablation;  Surgeon: [name], MD;  Location: [place] [facility];  Service: Cardiovascular;  Laterality: N/A;  ? HERNIA REPAIR    ? SQUAMOUS CELL CARCINOMA EXCISION    ? "right hand; left shoulder" (12/13/2017)  ? UMBILICAL HERNIA REPAIR  11-04-2005  ? ? ?Current Outpatient Medications  ?Medication Sig Dispense Refill  ? acetaminophen (TYLENOL) 500 MG tablet Take 1,000 mg by mouth every 6 (six) hours as needed for moderate pain.    ? allopurinol (ZYLOPRIM) 100 MG tablet Take 100 mg by mouth every other day.    ? amiodarone (PACERONE) 100 MG tablet Take 1 tablet (100 mg total) by mouth daily. 90 tablet 2  ? amLODipine (NORVASC) 10 MG tablet TAKE 1 TABLET BY MOUTH EVERY DAY 90 tablet 1  ? atorvastatin (LIPITOR) 40 MG tablet Take 40 mg by mouth daily.    ? BD PEN NEEDLE NANO 2ND GEN 32G X 4 MM MISC     ? betamethasone valerate ointment (VALISONE) 0.1 % 1 application 2 (two) times daily as needed (precancerous spots).    ? Cyanocobalamin (B-12 PO) Take 1 capsule by mouth daily.    ? Dulaglutide (TRULICITY) 1.5 WI/0.9BD SOPN Inject 1.5 mg into the skin every Wednesday.    ? hydrALAZINE (APRESOLINE) 50 MG tablet TAKE 1 TABLET BY MOUTH IN THE MORNING AND AT BEDTIME. 180 tablet 3  ? KLOR-CON M20 20 MEQ tablet TAKE 1 TABLET BY MOUTH EVERY DAY 90 tablet 3  ? LANTUS  SOLOSTAR 100 UNIT/ML Solostar Pen Inject 42 Units into the skin at bedtime.  0  ? losartan (COZAAR) 50 MG tablet Take 50 mg by mouth daily.    ? omeprazole (PRILOSEC) 20 MG capsule Take 20 mg by mouth daily.    ? OneTouch Delica Lancets 53G MISC USE AS DIRECTED EVERY DAY    ? ONETOUCH VERIO test strip USE AS DIRECTED (TEST DAILY) 90 **E11.9    ? oxymetazoline (AFRIN) 0.05 % nasal spray Place 1 spray into both nostrils 2 (two) times daily as needed for congestion.    ? pimecrolimus (ELIDEL) 1 % cream Apply 1 application topically 2 (two) times daily as needed (psoriasis).     ? spironolactone (ALDACTONE) 25 MG tablet TAKE 1/2 TABLET BY MOUTH EVERY DAY 45 tablet 3  ? tamsulosin (FLOMAX) 0.4 MG CAPS capsule Take 0.4 mg by mouth every evening.   4  ? torsemide (  DEMADEX) 20 MG tablet TAKE 3 TABLETS BY MOUTH EVERY DAY 270 tablet 3  ? VITAMIN D PO Take 1 capsule by mouth daily.    ? WIXELA INHUB 100-50 MCG/DOSE AEPB Inhale 1 puff into the lungs 2 (two) times daily.    ? XARELTO 15 MG TABS tablet TAKE 1 TABLET (15 MG TOTAL) BY MOUTH DAILY WITH SUPPER. 90 tablet 1  ? ?No current facility-administered medications for this encounter.  ? ? ?Allergies  ?Allergen Reactions  ? Indomethacin Other (See Comments)  ?  dizziness  ? ? ?Social History  ? ?Socioeconomic History  ? Marital status: Widowed  ?  Spouse name: Not on file  ? Number of children: Not on file  ? Years of education: Not on file  ? Highest education level: Not on file  ?Occupational History  ? Not on file  ?Tobacco Use  ? Smoking status: Never  ? Smokeless tobacco: Never  ? Tobacco comments:  ?  Never smoke 07/15/21  ?Vaping Use  ? Vaping Use: Never used  ?Substance and Sexual Activity  ? Alcohol use: Never  ? Drug use: Never  ? Sexual activity: Not Currently  ?Other Topics Concern  ? Not on file  ?Social History Narrative  ? Not on file  ? ?Social Determinants of Health  ? ?Financial Resource Strain: Not on file  ?Food Insecurity: Not on file  ?Transportation  Needs: Not on file  ?Physical Activity: Not on file  ?Stress: Not on file  ?Social Connections: Not on file  ?Intimate Partner Violence: Not on file  ? ? ? ?ROS- All systems are reviewed and negative except as p

## 2021-07-19 DIAGNOSIS — D6869 Other thrombophilia: Secondary | ICD-10-CM | POA: Diagnosis not present

## 2021-07-19 DIAGNOSIS — E1122 Type 2 diabetes mellitus with diabetic chronic kidney disease: Secondary | ICD-10-CM | POA: Diagnosis not present

## 2021-07-19 DIAGNOSIS — N184 Chronic kidney disease, stage 4 (severe): Secondary | ICD-10-CM | POA: Diagnosis not present

## 2021-07-19 DIAGNOSIS — J45909 Unspecified asthma, uncomplicated: Secondary | ICD-10-CM | POA: Diagnosis not present

## 2021-07-19 DIAGNOSIS — M109 Gout, unspecified: Secondary | ICD-10-CM | POA: Diagnosis not present

## 2021-08-17 DIAGNOSIS — N184 Chronic kidney disease, stage 4 (severe): Secondary | ICD-10-CM | POA: Diagnosis not present

## 2021-08-26 DIAGNOSIS — E1122 Type 2 diabetes mellitus with diabetic chronic kidney disease: Secondary | ICD-10-CM | POA: Diagnosis not present

## 2021-08-26 DIAGNOSIS — I129 Hypertensive chronic kidney disease with stage 1 through stage 4 chronic kidney disease, or unspecified chronic kidney disease: Secondary | ICD-10-CM | POA: Diagnosis not present

## 2021-08-26 DIAGNOSIS — N184 Chronic kidney disease, stage 4 (severe): Secondary | ICD-10-CM | POA: Diagnosis not present

## 2021-08-26 DIAGNOSIS — I4819 Other persistent atrial fibrillation: Secondary | ICD-10-CM | POA: Diagnosis not present

## 2021-09-12 ENCOUNTER — Other Ambulatory Visit: Payer: Self-pay | Admitting: Cardiology

## 2021-09-16 DIAGNOSIS — H524 Presbyopia: Secondary | ICD-10-CM | POA: Diagnosis not present

## 2021-09-16 DIAGNOSIS — E119 Type 2 diabetes mellitus without complications: Secondary | ICD-10-CM | POA: Diagnosis not present

## 2021-09-16 DIAGNOSIS — Z961 Presence of intraocular lens: Secondary | ICD-10-CM | POA: Diagnosis not present

## 2021-10-05 IMAGING — MG DIGITAL DIAGNOSTIC BILAT W/ TOMO W/ CAD
6 of 12 series · 6 of 36 positions shown · non-contrast
Comparison: None

CLINICAL DATA: Palpable lump in the left retroareolar region with
tenderness.

EXAM:
DIGITAL DIAGNOSTIC BILATERAL MAMMOGRAM WITH TOMO AND CAD

[L CC synth-2D (1 of 2)]
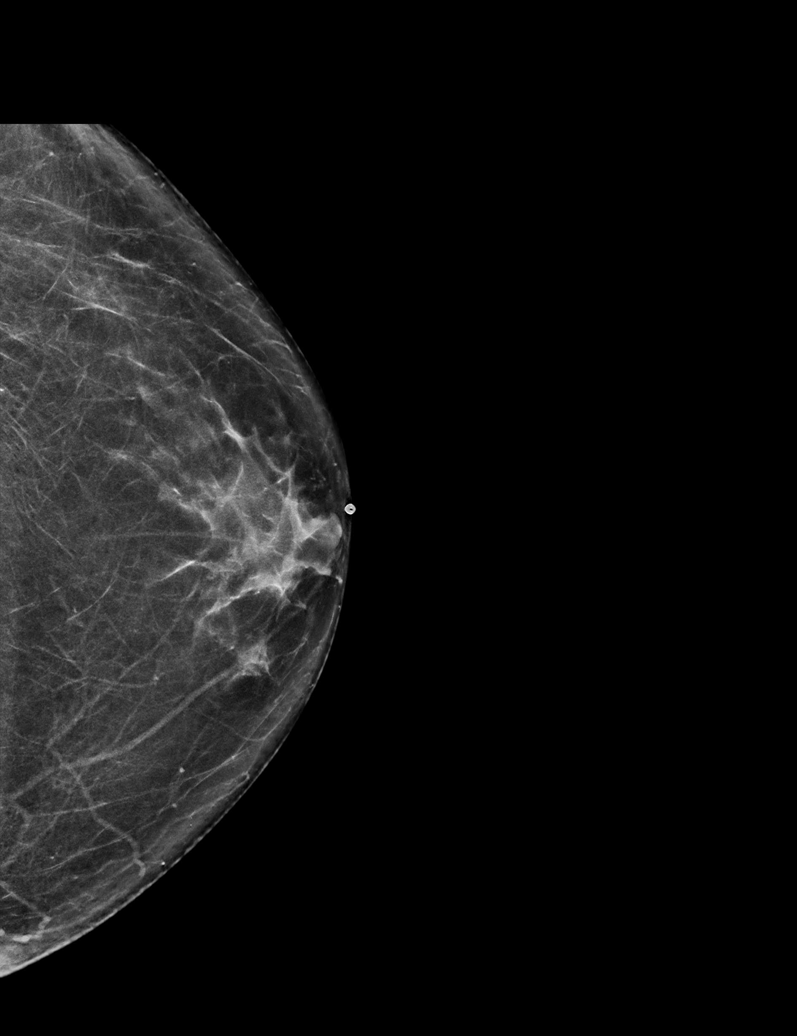

[L MLO synth-2D (1 of 2)]
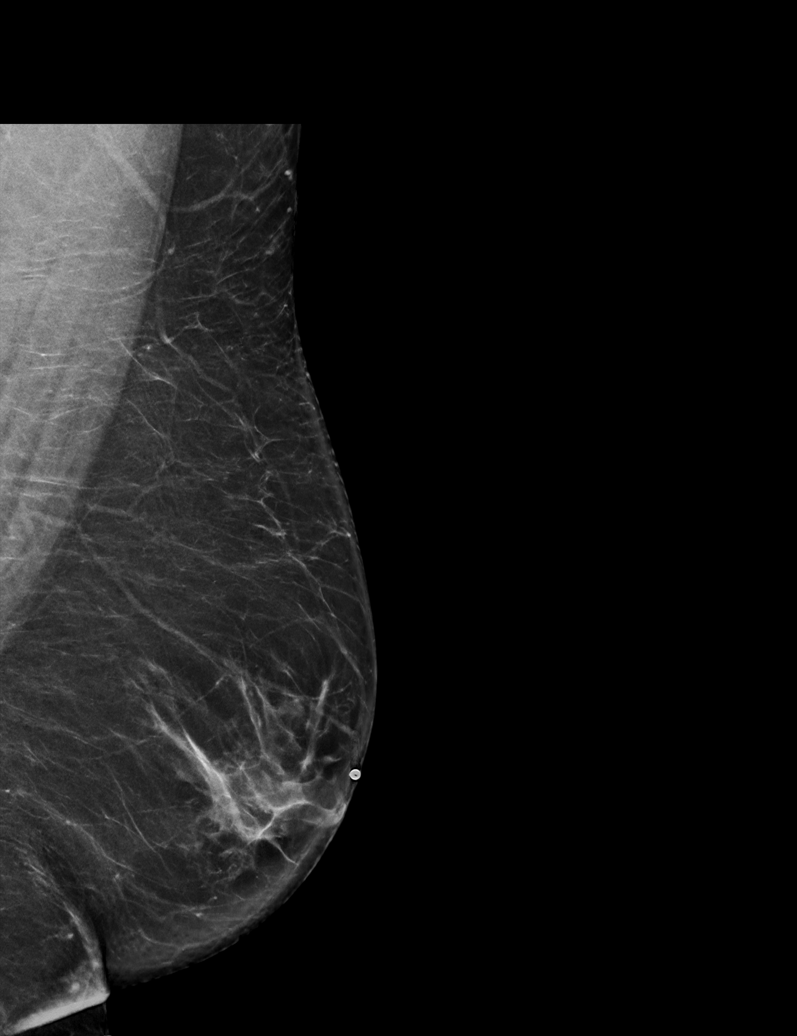

[L CC synth-2D (2 of 2)]
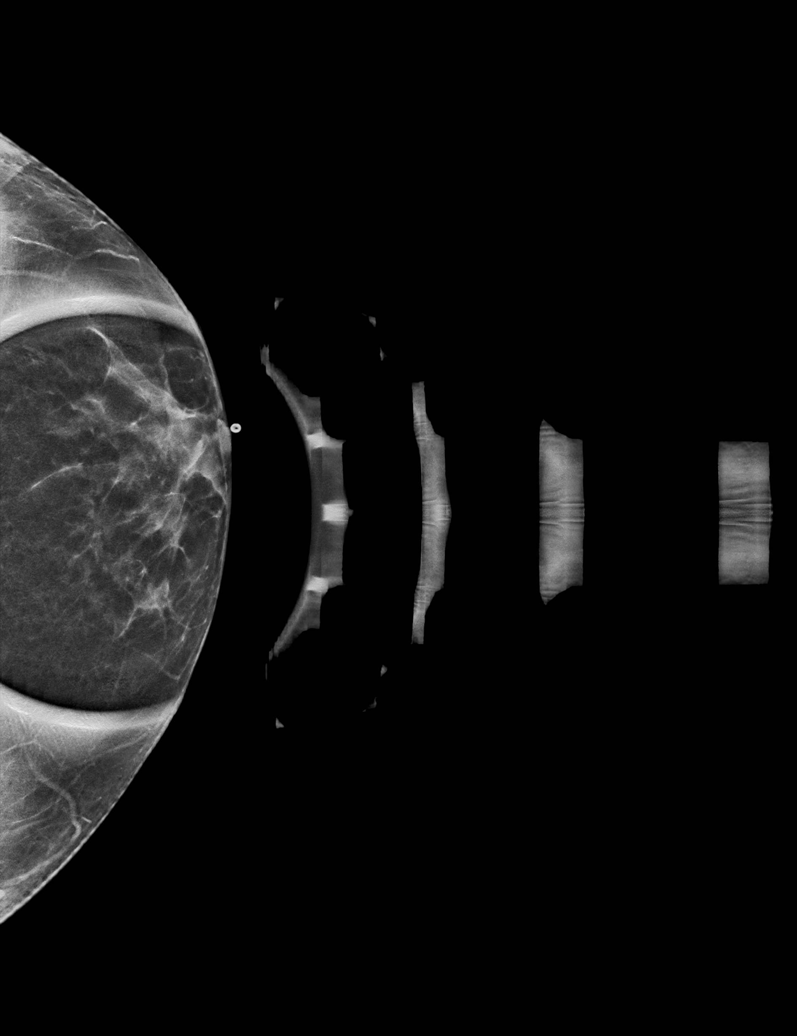

[R MLO synth-2D]
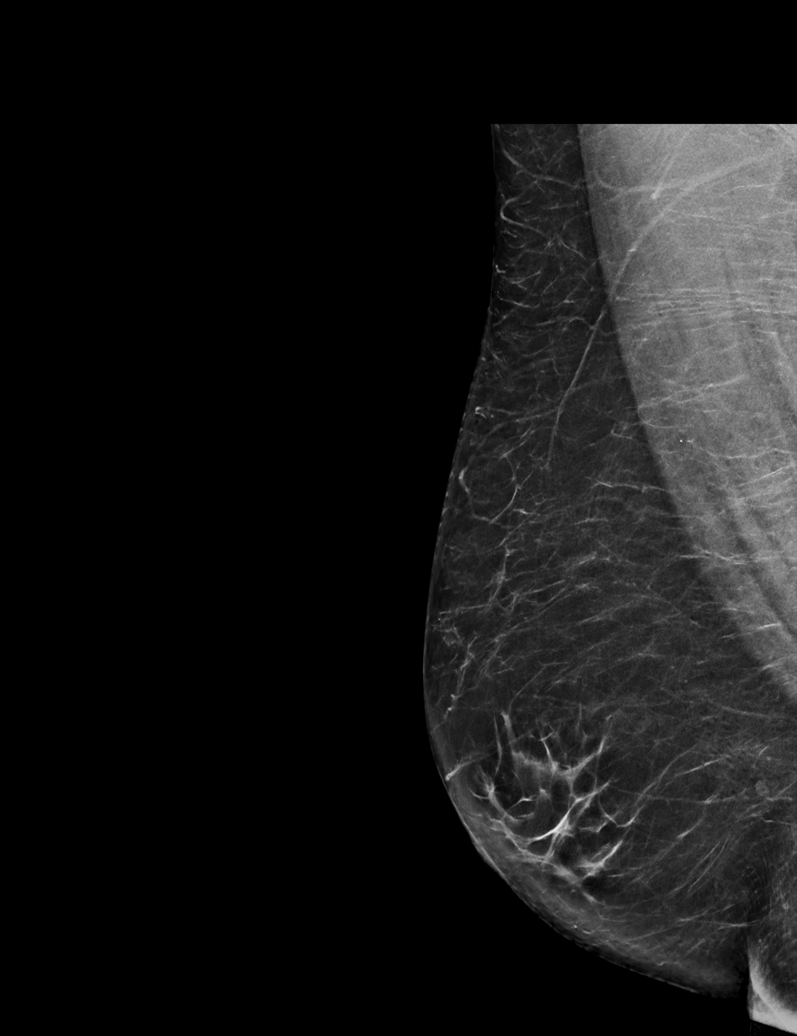

[R CC synth-2D]
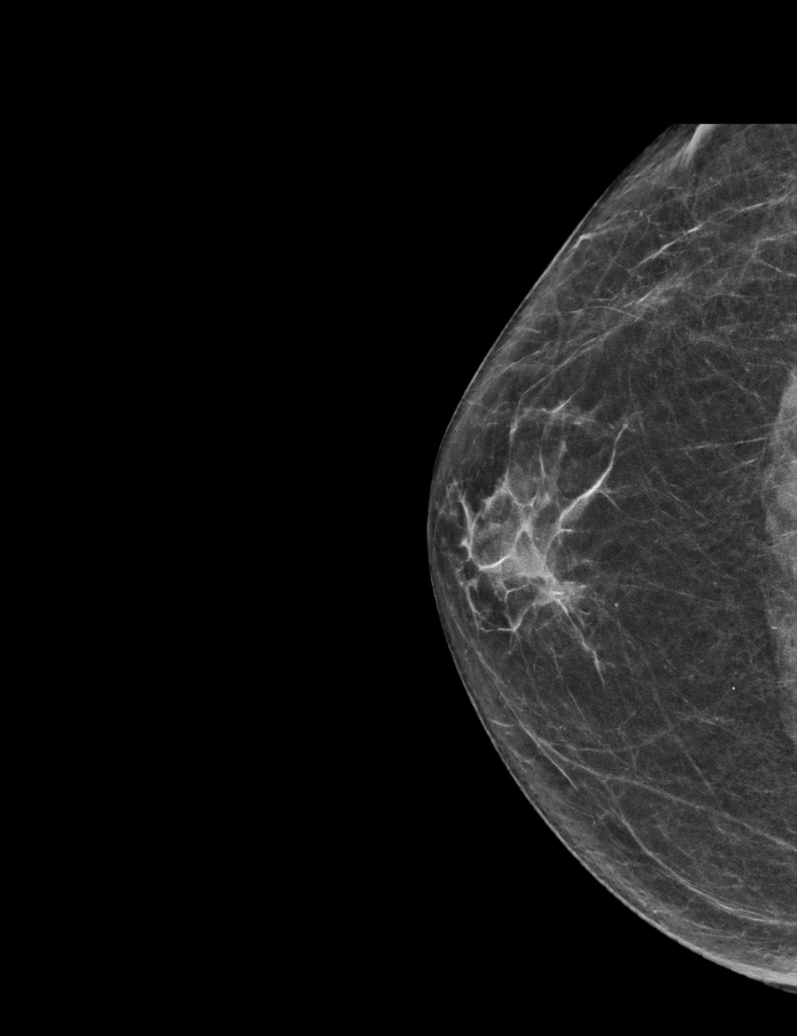

[L MLO synth-2D (2 of 2)]
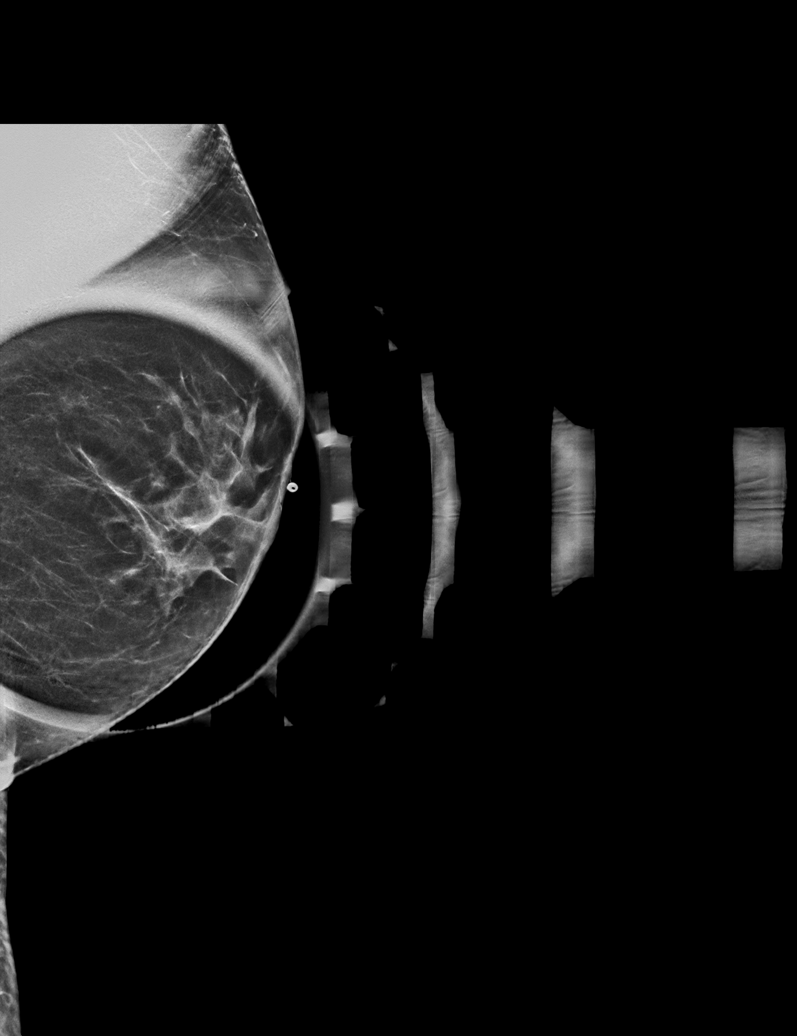

[6 of 36 positions shown; findings below may reference images not displayed]

ACR Breast Density Category b: There are scattered areas of
fibroglandular density.
FINDINGS: The patient has bilateral gynecomastia, explaining the symptoms on
the left. No mammographic evidence of malignancy.

Mammographic images were processed with CAD.
IMPRESSION: Bilateral gynecomastia.

RECOMMENDATION:
Treatment of the patient's gynecomastia should be based on clinical
and physical exam given lack of imaging findings. No follow-up
imaging necessary.

I have discussed the findings and recommendations with the patient.
If applicable, a reminder letter will be sent to the patient
regarding the next appointment.

BI-RADS CATEGORY  2: Benign.

## 2021-10-06 ENCOUNTER — Encounter: Payer: Self-pay | Admitting: Cardiology

## 2021-11-10 DIAGNOSIS — Z794 Long term (current) use of insulin: Secondary | ICD-10-CM | POA: Diagnosis not present

## 2021-11-10 DIAGNOSIS — Z85048 Personal history of other malignant neoplasm of rectum, rectosigmoid junction, and anus: Secondary | ICD-10-CM | POA: Diagnosis not present

## 2021-11-10 DIAGNOSIS — I110 Hypertensive heart disease with heart failure: Secondary | ICD-10-CM | POA: Diagnosis not present

## 2021-11-10 DIAGNOSIS — D6869 Other thrombophilia: Secondary | ICD-10-CM | POA: Diagnosis not present

## 2021-11-10 DIAGNOSIS — E1140 Type 2 diabetes mellitus with diabetic neuropathy, unspecified: Secondary | ICD-10-CM | POA: Diagnosis not present

## 2021-11-10 DIAGNOSIS — Z8249 Family history of ischemic heart disease and other diseases of the circulatory system: Secondary | ICD-10-CM | POA: Diagnosis not present

## 2021-11-10 DIAGNOSIS — E261 Secondary hyperaldosteronism: Secondary | ICD-10-CM | POA: Diagnosis not present

## 2021-11-10 DIAGNOSIS — I509 Heart failure, unspecified: Secondary | ICD-10-CM | POA: Diagnosis not present

## 2021-11-10 DIAGNOSIS — I4891 Unspecified atrial fibrillation: Secondary | ICD-10-CM | POA: Diagnosis not present

## 2021-11-10 DIAGNOSIS — Z833 Family history of diabetes mellitus: Secondary | ICD-10-CM | POA: Diagnosis not present

## 2021-11-10 DIAGNOSIS — I4892 Unspecified atrial flutter: Secondary | ICD-10-CM | POA: Diagnosis not present

## 2021-11-22 ENCOUNTER — Telehealth: Payer: Self-pay | Admitting: Cardiology

## 2021-11-22 DIAGNOSIS — E1122 Type 2 diabetes mellitus with diabetic chronic kidney disease: Secondary | ICD-10-CM | POA: Diagnosis not present

## 2021-11-22 DIAGNOSIS — E785 Hyperlipidemia, unspecified: Secondary | ICD-10-CM | POA: Diagnosis not present

## 2021-11-22 DIAGNOSIS — I10 Essential (primary) hypertension: Secondary | ICD-10-CM | POA: Diagnosis not present

## 2021-11-22 DIAGNOSIS — N184 Chronic kidney disease, stage 4 (severe): Secondary | ICD-10-CM | POA: Diagnosis not present

## 2021-11-22 NOTE — Telephone Encounter (Signed)
Spoke to pt who reports he is NSR, but HRs running high 40s - 50s. Noticed HRs are lower than used to be.  Used to be in 50-60s. Pt aware scheduler will call to arrange OV w/ [place]/EP APP/AFib clinic to further discuss HRs/dizziness. Patient verbalized understanding and agreeable to plan.

## 2021-11-22 NOTE — Telephone Encounter (Signed)
STAT if HR is under 50 or over 120 (normal HR is 60-100 beats per minute)  What is your heart rate? 48-52  Do you have a log of your heart rate readings (document readings)? No   Do you have any other symptoms? Extreme dizziness  Mark Singh from [place] called to give report on patient. Please give her a call back at [phone] Ext. [phone]

## 2021-11-22 NOTE — Telephone Encounter (Signed)
Returned call to [employer] from [employer]. States pt reported to them that his "pulse is down since last week". Pt reported severe dizziness to them, and states it is really bad when he goes from sitting to standing. She said that pt denied any CP or SOB. Aware I will follow up with pt to further discuss.

## 2021-11-24 ENCOUNTER — Ambulatory Visit (HOSPITAL_COMMUNITY)
Admission: RE | Admit: 2021-11-24 | Discharge: 2021-11-24 | Disposition: A | Discharge status: Home / Self Care | Payer: Medicare PPO | Source: Ambulatory Visit | Attending: Physician Assistant | Admitting: Physician Assistant

## 2021-11-24 ENCOUNTER — Encounter (HOSPITAL_COMMUNITY): Payer: Self-pay | Admitting: Physician Assistant

## 2021-11-24 VITALS — BP 118/50 | HR 52 | Ht 69.0 in | Wt 265.0 lb

## 2021-11-24 DIAGNOSIS — R600 Localized edema: Secondary | ICD-10-CM | POA: Diagnosis not present

## 2021-11-24 DIAGNOSIS — G4733 Obstructive sleep apnea (adult) (pediatric): Secondary | ICD-10-CM | POA: Diagnosis not present

## 2021-11-24 DIAGNOSIS — D6869 Other thrombophilia: Secondary | ICD-10-CM | POA: Diagnosis not present

## 2021-11-24 DIAGNOSIS — Z9989 Dependence on other enabling machines and devices: Secondary | ICD-10-CM | POA: Diagnosis not present

## 2021-11-24 DIAGNOSIS — I4819 Other persistent atrial fibrillation: Secondary | ICD-10-CM | POA: Diagnosis not present

## 2021-11-24 DIAGNOSIS — I5032 Chronic diastolic (congestive) heart failure: Secondary | ICD-10-CM | POA: Diagnosis not present

## 2021-11-24 DIAGNOSIS — Z6839 Body mass index (BMI) 39.0-39.9, adult: Secondary | ICD-10-CM | POA: Insufficient documentation

## 2021-11-24 DIAGNOSIS — I4892 Unspecified atrial flutter: Secondary | ICD-10-CM | POA: Insufficient documentation

## 2021-11-24 DIAGNOSIS — I110 Hypertensive heart disease with heart failure: Secondary | ICD-10-CM | POA: Diagnosis not present

## 2021-11-24 DIAGNOSIS — E669 Obesity, unspecified: Secondary | ICD-10-CM | POA: Diagnosis not present

## 2021-11-24 DIAGNOSIS — Z7901 Long term (current) use of anticoagulants: Secondary | ICD-10-CM | POA: Insufficient documentation

## 2021-11-24 DIAGNOSIS — R42 Dizziness and giddiness: Secondary | ICD-10-CM | POA: Diagnosis not present

## 2021-11-24 DIAGNOSIS — Z79899 Other long term (current) drug therapy: Secondary | ICD-10-CM | POA: Insufficient documentation

## 2021-11-24 LAB — COMPREHENSIVE METABOLIC PANEL
ALT: 32 U/L (ref 0–44)
AST: 27 U/L (ref 15–41)
Albumin: 3.2 g/dL — ABNORMAL LOW (ref 3.5–5.0)
Alkaline Phosphatase: 72 U/L (ref 38–126)
Anion gap: 8 (ref 5–15)
BUN: 34 mg/dL — ABNORMAL HIGH (ref 8–23)
CO2: 28 mmol/L (ref 22–32)
Calcium: 8.5 mg/dL — ABNORMAL LOW (ref 8.9–10.3)
Chloride: 102 mmol/L (ref 98–111)
Creatinine, Ser: 3.14 mg/dL — ABNORMAL HIGH (ref 0.61–1.24)
GFR, Estimated: 20 mL/min — ABNORMAL LOW (ref >60)
Glucose, Bld: 90 mg/dL (ref 70–99)
Potassium: 4.4 mmol/L (ref 3.5–5.1)
Sodium: 138 mmol/L (ref 135–145)
Total Bilirubin: 0.4 mg/dL (ref 0.3–1.2)
Total Protein: 6.6 g/dL (ref 6.5–8.1)

## 2021-11-24 LAB — TSH: TSH: 6.476 u[IU]/mL — ABNORMAL HIGH (ref 0.350–4.500)

## 2021-11-24 MED ORDER — AMIODARONE HCL 200 MG PO TABS: 100.0000 mg | ORAL_TABLET | Freq: Every day | ORAL | 3 refills | Status: DC

## 2021-11-24 NOTE — Progress Notes (Signed)
Primary Care Physician: Kathyrn Lass, MD Primary Cardiologist: Dr Marlou Porch Primary Electrophysiologist: Dr Curt Bears Referring Physician: Dr Melanee Left is a 76 y.o. male with a history of persistent atrial fibrillation who presents for follow up in the [facility]. S/p DCCV 09/20/18. He reports that he had some bradycardia and dizziness. His metoprolol was decreased. On 09/28/18 he was at his PCP office and he reports fluid in his abdomen and legs with increased SOB. Started on Lasix with symptomatic relief. He is on Xarelto for a CHADS2VASC score of 4. Patient is s/p DCCV 03/24/21.   On follow up today, patient reports that his heart rate has been lower recently. His rates have been in the upper 40s-low 50s. He does have dizziness at times, especially with position changes. He has also noticed increased lower extremity edema. He is in SR.  Today, he denies symptoms of palpitations, chest pain, orthopnea, PND, presyncope, syncope, bleeding, or neurologic sequela. The patient is tolerating medications without difficulties and is otherwise without complaint today.    Atrial Fibrillation Risk Factors:  he does not have symptoms or diagnosis of sleep apnea. he does not have a history of rheumatic fever. he does not have a history of alcohol use.  he has a BMI of Body mass index is 39.13 kg/m.[name] Filed Weights   11/24/21 1457  Weight: 120.2 kg    Family History  Problem Relation Age of Onset   Coronary artery disease Mother    CVA Father      Atrial Fibrillation Management history:  Previous antiarrhythmic drugs: flecainide, amiodarone  Previous cardioversion: several, most recently 09/20/18, 03/24/21 Previous ablations: 11/2017, 01/2018 CHADS2VASC score: 4 Anticoagulation history: Xarelto   Past Medical History:  Diagnosis Date   Arthritis    "hands, feet" (12/13/2017)   Asthma    Bradycardia    a. Repoted h/o HR in the 40s.    Edema    a. 2D echo 11/2013: EF 67-89%, LV diastolic function parameters were normal, severely dilated LA, PASP 63mHg..   H/O: gout    right foot; on daily RX" (12/13/2017)   HTN (hypertension)    Hyperlipidemia    Myalgia and myositis, unspecified    Nephrotic syndrome    a. responder to steroid therapy by Dr. DLorrene Reid    Obesity    Orthostatic hypotension    [place]ile [place] ([place]    "[place]"   Pulmonary hypertension ([place]    a. Mild pulm HTN felt 2/2 obesity.   PVC's (premature ventricular contractions)    a. Holter 05/2013 showing 18k PVCs (18% of the time).   Seasonal asthma    mild   Shoulder pain    Squamous carcinoma    "right hand; left shoulder" (12/13/2017)   Type 2 diabetes mellitus ([place]    Past Surgical History:  Procedure Laterality Date   A-FLUTTER ABLATION N/A 02/23/2018   Procedure: A-FLUTTER ABLATION;  Surgeon: CConstance Haw MD;  [facility];  Service: Cardiovascular;  Laterality: N/A;   ATRIAL FIBRILLATION ABLATION N/A 12/13/2017   Procedure: ATRIAL FIBRILLATION ABLATION;  Surgeon: CConstance Haw MD;  [facility];  Service: Cardiovascular;  Laterality: N/A;   BUNIONECTOMY WITH HAMMERTOE RECONSTRUCTION Left 1992  &  2008   REMOVAL HEAL SPUR/ BUNIONECTOMY AND HAMMERTOE REPAIR    CARDIOVERSION N/A 07/22/2015   Procedure: CARDIOVERSION;  Surgeon: KDorothy Spark MD;  [place]  Service: Cardiovascular;  Laterality:  N/A;   CARDIOVERSION N/A 10/06/2015   Procedure: CARDIOVERSION;  Surgeon: Sueanne Margarita, MD;  Location: [place];  Service: Cardiovascular;  Laterality: N/A;   CARDIOVERSION N/A 12/05/2016   Procedure: CARDIOVERSION;  Surgeon: Pixie Casino, MD;  Location: [place];  Service: Cardiovascular;  Laterality: N/A;   CARDIOVERSION N/A 06/29/2018   Procedure: CARDIOVERSION;  Surgeon: [name] Wonda Cheng, MD;  Location: [facility] ENDOSCOPY;  Service: Cardiovascular;  Laterality: N/A;   CARDIOVERSION N/A  09/20/2018   Procedure: CARDIOVERSION;  Surgeon: [name], MD;  Location: [place];  Service: Cardiovascular;  Laterality: N/A;   CARDIOVERSION N/A 03/24/2021   Procedure: CARDIOVERSION;  Surgeon: Freada Bergeron, MD;  Location: [place];  Service: Cardiovascular;  Laterality: N/A;   CIRCUMCISION N/A 11/27/2012   Procedure:  CIRCUMCISION  AND EXCISION OF GLANS PENIS;  Surgeon: Fredricka Bonine, MD;  Location: [facility];  Service: Urology;  Laterality: N/A;   ELECTROPHYSIOLOGIC STUDY N/A 03/30/2015   Procedure: PVC Ablation;  Surgeon: [name], MD;  Location: [place] CV LAB;  Service: Cardiovascular;  Laterality: N/A;   HERNIA REPAIR     SQUAMOUS CELL CARCINOMA EXCISION     "right hand; left shoulder" (3/57/0177)   UMBILICAL HERNIA REPAIR  11-04-2005    Current Outpatient Medications  Medication Sig Dispense Refill   acetaminophen (TYLENOL) 500 MG tablet Take 1,000 mg by mouth every 6 (six) hours as needed for moderate pain.     allopurinol (ZYLOPRIM) 100 MG tablet Take 100 mg by mouth every other day.     amiodarone (PACERONE) 100 MG tablet Take 1 tablet (100 mg total) by mouth daily. 90 tablet 2   amLODipine (NORVASC) 10 MG tablet TAKE 1 TABLET BY MOUTH EVERY DAY 90 tablet 1   atorvastatin (LIPITOR) 40 MG tablet Take 40 mg by mouth daily.     BD PEN NEEDLE NANO 2ND GEN 32G X 4 MM MISC      betamethasone valerate ointment (VALISONE) 0.1 % 1 application 2 (two) times daily as needed (precancerous spots).     Cyanocobalamin (B-12 PO) Take 1 capsule by mouth daily.     Dulaglutide (TRULICITY) 1.5 LT/9.0ZE SOPN Inject 1.5 mg into the skin every Wednesday.     hydrALAZINE (APRESOLINE) 50 MG tablet TAKE 1 TABLET BY MOUTH IN THE MORNING AND AT BEDTIME. 180 tablet 3   KLOR-CON M20 20 MEQ tablet TAKE 1 TABLET BY MOUTH EVERY DAY 90 tablet 3   LANTUS SOLOSTAR 100 UNIT/ML Solostar Pen Inject 42 Units into the skin at bedtime.  0   losartan  (COZAAR) 50 MG tablet Take 1 tablet (50 mg total) by mouth daily. 90 tablet 1   omeprazole (PRILOSEC) 20 MG capsule Take 20 mg by mouth daily.     OneTouch Delica Lancets 09Q MISC USE AS DIRECTED EVERY DAY     ONETOUCH VERIO test strip USE AS DIRECTED (TEST DAILY) 90 **E11.9     oxymetazoline (AFRIN) 0.05 % nasal spray Place 1 spray into both nostrils 2 (two) times daily as needed for congestion.     pimecrolimus (ELIDEL) 1 % cream Apply 1 application topically 2 (two) times daily as needed (psoriasis).      spironolactone (ALDACTONE) 25 MG tablet TAKE 1/2 TABLET BY MOUTH EVERY DAY 45 tablet 3   tamsulosin (FLOMAX) 0.4 MG CAPS capsule Take 0.4 mg by mouth every evening.   4   torsemide (DEMADEX) 20 MG tablet TAKE 3 TABLETS BY MOUTH EVERY DAY 270  tablet 3   VITAMIN D PO Take 1 capsule by mouth daily.     WIXELA INHUB 100-50 MCG/DOSE AEPB Inhale 1 puff into the lungs 2 (two) times daily.     XARELTO 15 MG TABS tablet TAKE 1 TABLET (15 MG TOTAL) BY MOUTH DAILY WITH SUPPER. 90 tablet 1   No current facility-administered medications for this encounter.    Allergies  Allergen Reactions   Indomethacin Other (See Comments)    dizziness    Social History   Socioeconomic History   Marital status: Widowed    Spouse name: Not on file   Number of children: Not on file   Years of education: Not on file   Highest education level: Not on file  Occupational History   Not on file  Tobacco Use   Smoking status: Never   Smokeless tobacco: Never   Tobacco comments:    Never smoke 07/15/21  Vaping Use   Vaping Use: Never used  Substance and Sexual Activity   Alcohol use: Never   Drug use: Never   Sexual activity: Not Currently  Other Topics Concern   Not on file  Social History Narrative   Not on file   Social Determinants of Health   Financial Resource Strain: Not on file  Food Insecurity: Not on file  Transportation Needs: Not on file  Physical Activity: Not on file  Stress: Not on  file  Social Connections: Not on file  Intimate Partner Violence: Not on file     ROS- All systems are reviewed and negative except as per the HPI above.  Physical Exam: Vitals:   11/24/21 1457  BP: (!) 118/50  Pulse: (!) 52  Weight: 120.2 kg  Height: '5\' 9"'$  (1.753 m)     GEN- The patient is a well appearing obese, elderly male, alert and oriented x 3 today.   HEENT-head normocephalic, atraumatic, sclera clear, conjunctiva pink, hearing intact, trachea midline. Lungs- Clear to ausculation bilaterally, normal work of breathing Heart- Regular rate and rhythm, bradycardia, no murmurs, rubs or gallops  GI- soft, NT, ND, + BS Extremities- no clubbing, cyanosis, 1-2+ bilateral edema MS- no significant deformity or atrophy Skin- no rash or lesion Psych- euthymic mood, full affect Neuro- strength and sensation are intact   Wt Readings from Last 3 Encounters:  11/24/21 120.2 kg  07/15/21 116.6 kg  04/15/21 120.9 kg    EKG today demonstrates  SB Vent. rate 52 BPM PR interval * ms QRS duration 96 ms QT/QTcB 514/478 ms  Echo 06/18/19 demonstrated   1. Left ventricular ejection fraction, by estimation, is 60 to 65%. The  left ventricle has normal function. The left ventricle has no regional  wall motion abnormalities. The left ventricular internal cavity size was mildly dilated. There is moderate concentric left ventricular hypertrophy. Left ventricular diastolic parameters are indeterminate.   2. Right ventricular systolic function is normal. The right ventricular  size is severely enlarged. There is mildly elevated pulmonary artery  systolic pressure. The estimated right ventricular systolic pressure is  35.0 mmHg.   3. Left atrial size was moderately dilated.   4. Right atrial size was moderately dilated.   5. The mitral valve is normal in structure and function. Trivial mitral  valve regurgitation. No evidence of mitral stenosis.   6. Tricuspid valve regurgitation is mild  to moderate.   7. The aortic valve is tricuspid. Aortic valve regurgitation is not  visualized. Mild aortic valve sclerosis is present, with no evidence of  aortic valve stenosis.   8. The inferior vena cava is dilated in size with <50% respiratory  variability, suggesting right atrial pressure of 15 mmHg.   Epic records are reviewed at length today  CHA2DS2-VASc Score = 4  The patient's score is based upon: CHF History: 1 HTN History: 1 Diabetes History: 0 Stroke History: 0 Vascular Disease History: 0 Age Score: 2 Gender Score: 0       ASSESSMENT AND PLAN: 1. Persistent Atrial Fibrillation/atrial flutter The patient's CHA2DS2-VASc score is 4, indicating a 4.8% annual risk of stroke.   Patient appears to be maintaining SR. He has noted more bradycardia recently. He is unsure if his amiodarone is a 100 mg pill or 200 mg (200 mg is on his home medication list).  Will plan to decrease amiodarone in half. Patient to call clinic back with correct dose.  Check cmet/tsh today.  Continue Xarelto 15 mg daily  2. Secondary Hypercoagulable State (ICD10:  D68.69) The patient is at significant risk for stroke/thromboembolism based upon his CHA2DS2-VASc Score of 4.  Continue Rivaroxaban (Xarelto).   3. Obesity Body mass index is 39.13 kg/m. Lifestyle modification was discussed and encouraged including regular physical activity and weight reduction.  4. HTN Stable, no changes today.  5. OSA Followed by [facility].  Encouraged compliance with CPAP therapy.  6. Chronic diastolic CHF Patient does have increased lower extremity edema. No orthopnea.  On torsemide 60 mg daily Patient will reach out to nephrologist for diuretic adjustment.    Follow up with Dr Curt Bears as scheduled.    [facility] [address] [place], [place] [address] [phone] 11/24/2021 3:17 PM

## 2021-11-24 NOTE — Addendum Note (Signed)
Encounter addended by: Hinda Kehr, CMA on: 11/24/2021 4:42 PM  Actions taken: Order list changed

## 2021-11-25 DIAGNOSIS — N480 Leukoplakia of penis: Secondary | ICD-10-CM | POA: Diagnosis not present

## 2021-11-25 DIAGNOSIS — C609 Malignant neoplasm of penis, unspecified: Secondary | ICD-10-CM | POA: Diagnosis not present

## 2021-11-26 ENCOUNTER — Other Ambulatory Visit: Payer: Self-pay | Admitting: Cardiology

## 2021-11-26 DIAGNOSIS — I480 Paroxysmal atrial fibrillation: Secondary | ICD-10-CM

## 2021-11-26 NOTE — Telephone Encounter (Signed)
Prescription refill request for Xarelto received.  Indication: Afib  Last office visit: 11/24/21 Marlene Lard) Weight: 120.2kg Age: 76 Scr: 3.14 (11/24/21) CrCl: 34.34m/min  Appropriate dose and refill sent to requested pharmacy.

## 2021-12-01 DIAGNOSIS — D485 Neoplasm of uncertain behavior of skin: Secondary | ICD-10-CM | POA: Diagnosis not present

## 2021-12-01 DIAGNOSIS — Z85828 Personal history of other malignant neoplasm of skin: Secondary | ICD-10-CM | POA: Diagnosis not present

## 2021-12-01 DIAGNOSIS — D0461 Carcinoma in situ of skin of right upper limb, including shoulder: Secondary | ICD-10-CM | POA: Diagnosis not present

## 2021-12-01 DIAGNOSIS — L814 Other melanin hyperpigmentation: Secondary | ICD-10-CM | POA: Diagnosis not present

## 2021-12-01 DIAGNOSIS — D225 Melanocytic nevi of trunk: Secondary | ICD-10-CM | POA: Diagnosis not present

## 2021-12-01 DIAGNOSIS — L578 Other skin changes due to chronic exposure to nonionizing radiation: Secondary | ICD-10-CM | POA: Diagnosis not present

## 2021-12-01 DIAGNOSIS — L219 Seborrheic dermatitis, unspecified: Secondary | ICD-10-CM | POA: Diagnosis not present

## 2021-12-01 DIAGNOSIS — C44629 Squamous cell carcinoma of skin of left upper limb, including shoulder: Secondary | ICD-10-CM | POA: Diagnosis not present

## 2021-12-01 DIAGNOSIS — L821 Other seborrheic keratosis: Secondary | ICD-10-CM | POA: Diagnosis not present

## 2021-12-13 DIAGNOSIS — N184 Chronic kidney disease, stage 4 (severe): Secondary | ICD-10-CM | POA: Diagnosis not present

## 2021-12-16 DIAGNOSIS — C44629 Squamous cell carcinoma of skin of left upper limb, including shoulder: Secondary | ICD-10-CM | POA: Diagnosis not present

## 2021-12-22 DIAGNOSIS — I4819 Other persistent atrial fibrillation: Secondary | ICD-10-CM | POA: Diagnosis not present

## 2021-12-22 DIAGNOSIS — I129 Hypertensive chronic kidney disease with stage 1 through stage 4 chronic kidney disease, or unspecified chronic kidney disease: Secondary | ICD-10-CM | POA: Diagnosis not present

## 2021-12-22 DIAGNOSIS — E1122 Type 2 diabetes mellitus with diabetic chronic kidney disease: Secondary | ICD-10-CM | POA: Diagnosis not present

## 2021-12-22 DIAGNOSIS — N184 Chronic kidney disease, stage 4 (severe): Secondary | ICD-10-CM | POA: Diagnosis not present

## 2021-12-28 DIAGNOSIS — C44629 Squamous cell carcinoma of skin of left upper limb, including shoulder: Secondary | ICD-10-CM | POA: Diagnosis not present

## 2021-12-30 DIAGNOSIS — T8149XA Infection following a procedure, other surgical site, initial encounter: Secondary | ICD-10-CM | POA: Diagnosis not present

## 2022-01-19 DIAGNOSIS — E1122 Type 2 diabetes mellitus with diabetic chronic kidney disease: Secondary | ICD-10-CM | POA: Diagnosis not present

## 2022-01-19 DIAGNOSIS — N1831 Chronic kidney disease, stage 3a: Secondary | ICD-10-CM | POA: Diagnosis not present

## 2022-01-19 DIAGNOSIS — Z23 Encounter for immunization: Secondary | ICD-10-CM | POA: Diagnosis not present

## 2022-01-19 DIAGNOSIS — E785 Hyperlipidemia, unspecified: Secondary | ICD-10-CM | POA: Diagnosis not present

## 2022-01-19 DIAGNOSIS — Z6839 Body mass index (BMI) 39.0-39.9, adult: Secondary | ICD-10-CM | POA: Diagnosis not present

## 2022-01-19 DIAGNOSIS — J45909 Unspecified asthma, uncomplicated: Secondary | ICD-10-CM | POA: Diagnosis not present

## 2022-01-27 DIAGNOSIS — L905 Scar conditions and fibrosis of skin: Secondary | ICD-10-CM | POA: Diagnosis not present

## 2022-01-27 DIAGNOSIS — M25562 Pain in left knee: Secondary | ICD-10-CM | POA: Diagnosis not present

## 2022-01-27 DIAGNOSIS — Z9889 Other specified postprocedural states: Secondary | ICD-10-CM | POA: Diagnosis not present

## 2022-02-07 ENCOUNTER — Ambulatory Visit: Payer: Medicare PPO | Admitting: Cardiology

## 2022-02-25 DIAGNOSIS — N184 Chronic kidney disease, stage 4 (severe): Secondary | ICD-10-CM | POA: Diagnosis not present

## 2022-02-28 ENCOUNTER — Ambulatory Visit: Payer: Medicare PPO | Attending: Cardiology | Admitting: Cardiology

## 2022-02-28 ENCOUNTER — Other Ambulatory Visit: Payer: Self-pay | Admitting: Cardiology

## 2022-02-28 ENCOUNTER — Encounter: Payer: Self-pay | Admitting: Cardiology

## 2022-02-28 VITALS — BP 130/64 | HR 52 | Wt 266.8 lb

## 2022-02-28 DIAGNOSIS — I130 Hypertensive heart and chronic kidney disease with heart failure and stage 1 through stage 4 chronic kidney disease, or unspecified chronic kidney disease: Secondary | ICD-10-CM | POA: Diagnosis not present

## 2022-02-28 DIAGNOSIS — E039 Hypothyroidism, unspecified: Secondary | ICD-10-CM

## 2022-02-28 DIAGNOSIS — N184 Chronic kidney disease, stage 4 (severe): Secondary | ICD-10-CM | POA: Diagnosis not present

## 2022-02-28 DIAGNOSIS — I4819 Other persistent atrial fibrillation: Secondary | ICD-10-CM | POA: Diagnosis not present

## 2022-02-28 DIAGNOSIS — Z6841 Body Mass Index (BMI) 40.0 and over, adult: Secondary | ICD-10-CM | POA: Diagnosis not present

## 2022-02-28 DIAGNOSIS — N041 Nephrotic syndrome with focal and segmental glomerular lesions: Secondary | ICD-10-CM | POA: Diagnosis not present

## 2022-02-28 DIAGNOSIS — E1122 Type 2 diabetes mellitus with diabetic chronic kidney disease: Secondary | ICD-10-CM | POA: Diagnosis not present

## 2022-02-28 DIAGNOSIS — D6869 Other thrombophilia: Secondary | ICD-10-CM | POA: Diagnosis not present

## 2022-02-28 DIAGNOSIS — I5030 Unspecified diastolic (congestive) heart failure: Secondary | ICD-10-CM | POA: Diagnosis not present

## 2022-02-28 DIAGNOSIS — Z79899 Other long term (current) drug therapy: Secondary | ICD-10-CM | POA: Diagnosis not present

## 2022-02-28 NOTE — Patient Instructions (Signed)
Medication Instructions:  Your physician recommends that you continue on your current medications as directed. Please refer to the Current Medication list given to you today.  *If you need a refill on your cardiac medications before your next appointment, please call your pharmacy*   Lab Work: TSH today If you have labs (blood work) drawn today and your tests are completely normal, you will receive your results only by: [place] (if you have MyChart) OR A paper copy in the mail If you have any lab test that is abnormal or we need to change your treatment, we will call you to review the results.   Testing/Procedures: None ordered.    Follow-Up: At [facility], you and your health needs are our priority.  As part of our continuing mission to provide you with exceptional heart care, we have created designated Provider Care Teams.  These Care Teams include your primary Cardiologist (physician) and Advanced Practice Providers (APPs -  Physician Assistants and Nurse Practitioners) who all work together to provide you with the care you need, when you need it.  We recommend signing up for the patient portal called "MyChart".  Sign up information is provided on this After Visit Summary.  MyChart is used to connect with patients for Virtual Visits (Telemedicine).  Patients are able to view lab/test results, encounter notes, upcoming appointments, etc.  Non-urgent messages can be sent to your provider as well.   To learn more about what you can do with MyChart, go to [email or web address].    Your next appointment:   6 months with Dr Curt Bears PA  Important Information About Sugar

## 2022-02-28 NOTE — Progress Notes (Signed)
Electrophysiology Office Note   Date:  02/28/2022   ID:  Mark Singh, DOB 12-Dec-1945, MRN [id number]  PCP:  Kathyrn Lass, MD  Cardiologist:  Marlou Porch Primary Electrophysiologist:  Lluvia Gwynne Meredith Leeds, MD    No chief complaint on file.     History of Present Illness: Mark Singh is a 76 y.o. male who presents today for electrophysiology evaluation.     He has a history significant for PVCs, nephrotic syndrome, diabetes, hypertension, hyperlipidemia, morbid obesity, pulmonary hypertension.  He is post PVC ablation.  PVCs were inferior to the left coronary cusp.  Ablation was performed 03/30/2015.  He went into atrial fibrillation and is post ablation 12/13/2017.  He then presented in atrial flutter and is post ablation 02/26/2018.  He is currently on amiodarone for atrial fibrillation.  Today, denies symptoms of palpitations, chest pain, shortness of breath, orthopnea, PND, lower extremity edema, claudication, dizziness, presyncope, syncope, bleeding, or neurologic sequela. The patient is tolerating medications without difficulties.  Since being seen he has done well.  He is noted no further episodes of atrial fibrillation.  He is overall quite happy with his control.  Past Medical History:  Diagnosis Date   Arthritis    "hands, feet" (12/13/2017)   Asthma    Bradycardia    a. Repoted h/o HR in the 40s.   Edema    a. 2D echo 11/2013: EF 97-67%, LV diastolic function parameters were normal, severely dilated LA, PASP 51mHg..   H/O: gout    right foot; on daily RX" (12/13/2017)   HTN (hypertension)    Hyperlipidemia    Myalgia and myositis, unspecified    Nephrotic syndrome    a. responder to steroid therapy by Dr. DLorrene Reid    Obesity    Orthostatic hypotension    [place] ([place]    "[place]"   Pulmonary hypertension ([place]    a. Mild pulm HTN felt 2/2 obesity.   PVC's (premature ventricular contractions)    a. Holter 05/2013 showing 18k PVCs (18% of the  time).   Seasonal asthma    mild   Shoulder pain    Squamous carcinoma    "right hand; left shoulder" (12/13/2017)   Type 2 diabetes mellitus ([place]    Past Surgical History:  Procedure Laterality Date   A-FLUTTER ABLATION N/A 02/23/2018   Procedure: A-FLUTTER ABLATION;  Surgeon: CConstance Haw MD;  [place];  Service: Cardiovascular;  Laterality: N/A;   ATRIAL FIBRILLATION ABLATION N/A 12/13/2017   Procedure: ATRIAL FIBRILLATION ABLATION;  Surgeon: CConstance Haw MD;  [facility];  Service: Cardiovascular;  Laterality: N/A;   BUNIONECTOMY WITH HAMMERTOE RECONSTRUCTION Left 1992  &  2008   REMOVAL HEAL SPUR/ BUNIONECTOMY AND HAMMERTOE REPAIR    CARDIOVERSION N/A 07/22/2015   Procedure: CARDIOVERSION;  Surgeon: KDorothy Spark MD;  [place]  Service: Cardiovascular;  Laterality: N/A;   CARDIOVERSION N/A 10/06/2015   Procedure: CARDIOVERSION;  Surgeon: TSueanne Margarita MD;  [facility];  Service: Cardiovascular;  Laterality: N/A;   CARDIOVERSION N/A 12/05/2016   Procedure: CARDIOVERSION;  Surgeon: HPixie Casino MD;  [facility];  Service: Cardiovascular;  Laterality: N/A;   CARDIOVERSION N/A 06/29/2018   Procedure: CARDIOVERSION;  Surgeon: NThayer Headings MD;  [place]  Service: Cardiovascular;  Laterality: N/A;   CARDIOVERSION N/A 09/20/2018   Procedure: CARDIOVERSION;  Surgeon: RSkeet Latch MD;  [place]  Service: Cardiovascular;  Laterality: N/A;  CARDIOVERSION N/A 03/24/2021   Procedure: CARDIOVERSION;  Surgeon: Freada Bergeron, MD;  [facility] ENDOSCOPY;  Service: Cardiovascular;  Laterality: N/A;   CIRCUMCISION N/A 11/27/2012   Procedure:  CIRCUMCISION  AND EXCISION OF GLANS PENIS;  Surgeon: Fredricka Bonine, MD;  [facility];  Service: Urology;  Laterality: N/A;   ELECTROPHYSIOLOGIC STUDY N/A 03/30/2015   Procedure: PVC  Ablation;  Surgeon: Keamber Macfadden Meredith Leeds, MD;  Location: [place] CV LAB;  Service: Cardiovascular;  Laterality: N/A;   HERNIA REPAIR     SQUAMOUS CELL CARCINOMA EXCISION     "right hand; left shoulder" (3/79/0240)   UMBILICAL HERNIA REPAIR  11-04-2005     Current Outpatient Medications  Medication Sig Dispense Refill   acetaminophen (TYLENOL) 500 MG tablet Take 1,000 mg by mouth every 6 (six) hours as needed for moderate pain.     allopurinol (ZYLOPRIM) 100 MG tablet Take 50 mg by mouth every other day.     amiodarone (PACERONE) 200 MG tablet Take 0.5 tablets (100 mg total) by mouth daily. 60 tablet 3   amLODipine (NORVASC) 10 MG tablet TAKE 1 TABLET BY MOUTH EVERY DAY 90 tablet 1   atorvastatin (LIPITOR) 40 MG tablet Take 40 mg by mouth daily.     BD PEN NEEDLE NANO 2ND GEN 32G X 4 MM MISC      betamethasone valerate ointment (VALISONE) 0.1 % 1 application 2 (two) times daily as needed (precancerous spots).     Cyanocobalamin (B-12 PO) Take 1 capsule by mouth daily.     Dulaglutide (TRULICITY) 1.5 XB/3.5HG SOPN Inject 1.5 mg into the skin every Wednesday.     hydrALAZINE (APRESOLINE) 50 MG tablet TAKE 1 TABLET BY MOUTH IN THE MORNING AND AT BEDTIME. 180 tablet 3   KLOR-CON M20 20 MEQ tablet TAKE 1 TABLET BY MOUTH EVERY DAY 90 tablet 3   LANTUS SOLOSTAR 100 UNIT/ML Solostar Pen Inject 42 Units into the skin at bedtime.  0   losartan (COZAAR) 50 MG tablet Take 1 tablet (50 mg total) by mouth daily. 90 tablet 1   omeprazole (PRILOSEC) 20 MG capsule Take 20 mg by mouth daily.     OneTouch Delica Lancets 99M MISC USE AS DIRECTED EVERY DAY     ONETOUCH VERIO test strip USE AS DIRECTED (TEST DAILY) 90 **E11.9     oxymetazoline (AFRIN) 0.05 % nasal spray Place 1 spray into both nostrils 2 (two) times daily as needed for congestion.     pimecrolimus (ELIDEL) 1 % cream Apply 1 application topically 2 (two) times daily as needed (psoriasis).      spironolactone (ALDACTONE) 25 MG tablet TAKE 1/2  TABLET BY MOUTH EVERY DAY 45 tablet 3   tamsulosin (FLOMAX) 0.4 MG CAPS capsule Take 0.4 mg by mouth every evening.   4   torsemide (DEMADEX) 20 MG tablet TAKE 3 TABLETS BY MOUTH EVERY DAY 270 tablet 3   VITAMIN D PO Take 1 capsule by mouth daily.     WIXELA INHUB 100-50 MCG/DOSE AEPB Inhale 1 puff into the lungs 2 (two) times daily.     XARELTO 15 MG TABS tablet TAKE 1 TABLET (15 MG TOTAL) BY MOUTH DAILY WITH SUPPER 90 tablet 1   No current facility-administered medications for this visit.    Allergies:   Indomethacin   Social History:  The patient  reports that he has never smoked. He has never used smokeless tobacco. He reports that he does not drink alcohol and does  not use drugs.   Family History:  The patient's family history includes CVA in his father; Coronary artery disease in his mother.   ROS:  Please see the history of present illness.   Otherwise, review of systems is positive for none.   All other systems are reviewed and negative.   PHYSICAL EXAM: VS:  BP 130/64   Pulse (!) 52   Wt 266 lb 12.8 oz (121 kg)   SpO2 90%   BMI 39.40 kg/m  , BMI Body mass index is 39.4 kg/m. GEN: Well nourished, well developed, in no acute distress  HEENT: normal  Neck: no JVD, carotid bruits, or masses Cardiac: RRR; no murmurs, rubs, or gallops,no edema  Respiratory:  clear to auscultation bilaterally, normal work of breathing GI: soft, nontender, nondistended, + BS MS: no deformity or atrophy  Skin: warm and dry Neuro:  Strength and sensation are intact Psych: euthymic mood, full affect  EKG:  EKG is ordered today. Personal review of the ekg ordered shows ectopic atrial rhythm, rate 54  Recent Labs: 03/16/2021: Hemoglobin 11.2; Platelets 318 11/24/2021: ALT 32; BUN 34; Creatinine, Ser 3.14; Potassium 4.4; Sodium 138; TSH 6.476    Lipid Panel     Component Value Date/Time   CHOL 119 04/05/2021 0925   TRIG 82 04/05/2021 0925   HDL 48 04/05/2021 0925   CHOLHDL 2.5 04/05/2021  0925   LDLCALC 55 04/05/2021 0925     Wt Readings from Last 3 Encounters:  02/28/22 266 lb 12.8 oz (121 kg)  11/24/21 265 lb (120.2 kg)  07/15/21 257 lb (116.6 kg)      Other studies Reviewed: Additional studies/ records that were reviewed today include: TTE 12/28/14  Review of the above records today demonstrates:  - Left ventricle: The cavity size was normal. There was mild   concentric hypertrophy. Systolic function was normal. The   estimated ejection fraction was in the range of 55% to 60%.   Although no diagnostic regional wall motion abnormality was   identified, this possibility cannot be completely excluded on the   basis of this study. Left ventricular diastolic function   parameters were normal. - Left atrium: The atrium was severely dilated. - Pulmonary arteries: Systolic pressure was mildly increased. PA   peak pressure: 38 mm Hg (S).   ASSESSMENT AND PLAN:  1.  PVCs: Post ablation 03/10/2015.  No obvious recurrence.  Continue with current management.  2.  Persistent atrial fibrillation: Post ablation 12/23/2017.  Currently on Xarelto 15 mg daily, amiodarone 200 mg daily.  CHA2DS2-VASc of 3.  He remains in sinus rhythm.  He is currently feeling well.  No changes at this time.  We Tariyah Pendry check a TSH for high risk medication monitoring for amiodarone.  3.  Typical atrial flutter: Status post ablation 02/26/2018.  No obvious recurrence.  4.  Chronic diastolic heart failure: No obvious volume overload  5.  CKD stage IV-V: Followed by nephrology.  Continue torsemide.  6.  Hypertension: Currently well controlled  7.  Obstructive sleep apnea: CPAP compliance encouraged  8.  Secondary to coagula state: Currently on Xarelto for atrial fibrillation as above.   Current medicines are reviewed at length with the patient today.   The patient does not have concerns regarding his medicines.  The following changes were made today: None  Labs/ tests ordered today include:   Orders Placed This Encounter  Procedures   TSH   EKG 12-Lead      Disposition:   FU  6 months  Signed, Liela Rylee Meredith Leeds, MD  02/28/2022 9:38 AM     [facility] [address] [place] [address] [phone] (office) [phone] (fax)

## 2022-03-01 LAB — TSH: TSH: 0.021 u[IU]/mL — ABNORMAL LOW (ref 0.450–4.500)

## 2022-03-08 ENCOUNTER — Other Ambulatory Visit: Payer: Self-pay | Admitting: Cardiology

## 2022-03-15 ENCOUNTER — Other Ambulatory Visit: Payer: Self-pay | Admitting: Cardiology

## 2022-03-16 DIAGNOSIS — Z0000 Encounter for general adult medical examination without abnormal findings: Secondary | ICD-10-CM | POA: Diagnosis not present

## 2022-03-16 DIAGNOSIS — Z1389 Encounter for screening for other disorder: Secondary | ICD-10-CM | POA: Diagnosis not present

## 2022-03-21 DIAGNOSIS — R946 Abnormal results of thyroid function studies: Secondary | ICD-10-CM | POA: Diagnosis not present

## 2022-03-21 DIAGNOSIS — R7989 Other specified abnormal findings of blood chemistry: Secondary | ICD-10-CM | POA: Diagnosis not present

## 2022-03-31 ENCOUNTER — Other Ambulatory Visit: Payer: Self-pay | Admitting: Cardiology

## 2022-04-01 ENCOUNTER — Other Ambulatory Visit: Payer: Self-pay | Admitting: Cardiology

## 2022-04-03 NOTE — Progress Notes (Unsigned)
Cardiology Office Note:    Date:  04/04/2022   ID:  Mark Singh, DOB Jul 25, 1945, MRN [id number]  PCP:  Kathyrn Lass, MD   [facility] Providers Cardiologist:  [name], MD Electrophysiologist:  Constance Haw, MD     Referring MD: Kathyrn Lass, MD   Chief Complaint: shortness of breath  History of Present Illness:    Mark Singh is a very pleasant 76 y.o. male with a hx of PVCs s/p ablation, nephrotic syndrome, CKD stage IV diabetes, hypertension, hyperlipidemia, morbid obesity, pulmonary hypertension, and persistent atrial fibrillation on chronic anticoagulation, OSA on CPAP  He underwent PVC ablation on 03/30/2015. PVCs were inferior to the left coronary cusp.  Following PVC ablation he went into atrial fibrillation and is post A-fib ablation 12/13/2017.  He then presented in atrial flutter and is post ablation 02/26/2018. Currently on amiodarone for atrial fibrillation.  Previous cardioversions 08/2018, 03/2021. Generally asymptomatic in A-fib.  Seen by Dr. Marlou Porch 03/29/2021 for general cardiology follow-up.  Carotid ultrasound was ordered which revealed mild plaque in the carotid arteries, right subclavian artery stenotic, medical management recommended. Has struggled with weight loss and with high protein diet in the setting of CKD. Followed by nephrology.   Seen in cardiology clinic by [name] on 02/28/2022 at which time he was feeling well. He had abnormal TSH which was monitored for amiodarone therapy and was advised to follow-up with PCP.  Today, he is here alone for evaluation of worsening shortness of breath. Reports this has worsened since he saw [name] 02/28/22. Also feeling fatigued, wants to sleep constantly. Goes to gym daily, walks 1 mile and does some weight lifting. Feels like he cannot complete a 1 mile walk for the last few weeks. Weight up and down weekly, 2-3 lbs. No edema, orthopnea, or PND. He denies chest pain, palpitations,  presyncope, syncope.  Compliant on CPAP. CKD is followed by Dr. Marval Regal, sees him regularly. Had repeat thyroid lab work with PCP, was told they would continue to monitor.   Past Medical History:  Diagnosis Date   Arthritis    "hands, feet" (12/13/2017)   Asthma    Bradycardia    a. Repoted h/o HR in the 40s.   Edema    a. 2D echo 11/2013: EF 24-40%, LV diastolic function parameters were normal, severely dilated LA, PASP 71mHg..   H/O: gout    right foot; on daily RX" (12/13/2017)   HTN (hypertension)    Hyperlipidemia    Myalgia and myositis, unspecified    Nephrotic syndrome    a. responder to steroid therapy by Dr. DLorrene Reid    Obesity    Orthostatic hypotension    Penile cancer ([place]    "did circ"   Pulmonary hypertension ([place]    a. Mild pulm HTN felt 2/2 obesity.   PVC's (premature ventricular contractions)    a. Holter 05/2013 showing 18k PVCs (18% of the time).   Seasonal asthma    mild   Shoulder pain    Squamous carcinoma    "right hand; left shoulder" (12/13/2017)   Type 2 diabetes mellitus ([place]     Past Surgical History:  Procedure Laterality Date   A-FLUTTER ABLATION N/A 02/23/2018   Procedure: A-FLUTTER ABLATION;  Surgeon: CConstance Haw MD;  [facility] [place];  Service: Cardiovascular;  Laterality: N/A;   ATRIAL FIBRILLATION ABLATION N/A 12/13/2017   Procedure: ATRIAL FIBRILLATION ABLATION;  Surgeon: CConstance Haw MD;  [facility];  Service: Cardiovascular;  Laterality: N/A;   BUNIONECTOMY WITH HAMMERTOE RECONSTRUCTION Left 1992  &  2008   REMOVAL HEAL SPUR/ BUNIONECTOMY AND HAMMERTOE REPAIR    CARDIOVERSION N/A 07/22/2015   Procedure: CARDIOVERSION;  Surgeon: Dorothy Spark, MD;  Location: [place];  Service: Cardiovascular;  Laterality: N/A;   CARDIOVERSION N/A 10/06/2015   Procedure: CARDIOVERSION;  Surgeon: Sueanne Margarita, MD;  Location: [place];  Service: Cardiovascular;  Laterality: N/A;    CARDIOVERSION N/A 12/05/2016   Procedure: CARDIOVERSION;  Surgeon: Pixie Casino, MD;  Location: [place];  Service: Cardiovascular;  Laterality: N/A;   CARDIOVERSION N/A 06/29/2018   Procedure: CARDIOVERSION;  Surgeon: Thayer Headings, MD;  Location: [place];  Service: Cardiovascular;  Laterality: N/A;   CARDIOVERSION N/A 09/20/2018   Procedure: CARDIOVERSION;  Surgeon: [name], MD;  Location: [place];  Service: Cardiovascular;  Laterality: N/A;   CARDIOVERSION N/A 03/24/2021   Procedure: CARDIOVERSION;  Surgeon: Freada Bergeron, MD;  Location: [facility] ENDOSCOPY;  Service: Cardiovascular;  Laterality: N/A;   CIRCUMCISION N/A 11/27/2012   Procedure:  CIRCUMCISION  AND EXCISION OF GLANS PENIS;  Surgeon: Fredricka Bonine, MD;  Location[facility];  Service: Urology;  Laterality: N/A;   ELECTROPHYSIOLOGIC STUDY N/A 03/30/2015   Procedure: PVC Ablation;  Surgeon: [name], MD;  Location: [place] [facility];  Service: Cardiovascular;  Laterality: N/A;   HERNIA REPAIR     SQUAMOUS CELL CARCINOMA EXCISION     "right hand; left shoulder" (8/85/0277)   UMBILICAL HERNIA REPAIR  11-04-2005    Current Medications: Current Meds  Medication Sig   acetaminophen (TYLENOL) 500 MG tablet Take 1,000 mg by mouth every 6 (six) hours as needed for moderate pain.   allopurinol (ZYLOPRIM) 100 MG tablet Take 50 mg by mouth every other day.   amiodarone (PACERONE) 200 MG tablet Take 0.5 tablets (100 mg total) by mouth daily.   amLODipine (NORVASC) 10 MG tablet TAKE 1 TABLET BY MOUTH EVERY DAY   atorvastatin (LIPITOR) 40 MG tablet Take 40 mg by mouth daily.   BD PEN NEEDLE NANO 2ND GEN 32G X 4 MM MISC    betamethasone valerate ointment (VALISONE) 0.1 % 1 application 2 (two) times daily as needed (precancerous spots).   Cyanocobalamin (B-12 PO) Take 1 capsule by mouth daily.   Dulaglutide (TRULICITY) 1.5 AJ/2.8NO SOPN Inject 1.5 mg into the skin every  Wednesday.   hydrALAZINE (APRESOLINE) 50 MG tablet Take 1 tablet (50 mg total) by mouth 2 (two) times daily. Please contact the office to schedule appointment for additional refills.   KLOR-CON M20 20 MEQ tablet TAKE 1 TABLET BY MOUTH EVERY DAY   LANTUS SOLOSTAR 100 UNIT/ML Solostar Pen Inject 30 Units into the skin at bedtime.   losartan (COZAAR) 50 MG tablet TAKE 1 TABLET BY MOUTH EVERY DAY   omeprazole (PRILOSEC) 20 MG capsule Take 20 mg by mouth daily.   OneTouch Delica Lancets 67E MISC USE AS DIRECTED EVERY DAY   ONETOUCH VERIO test strip USE AS DIRECTED (TEST DAILY) 90 **E11.9   oxymetazoline (AFRIN) 0.05 % nasal spray Place 1 spray into both nostrils 2 (two) times daily as needed for congestion.   pimecrolimus (ELIDEL) 1 % cream Apply 1 application topically 2 (two) times daily as needed (psoriasis).    spironolactone (ALDACTONE) 25 MG tablet TAKE 1/2 TABLET BY MOUTH EVERY DAY   tamsulosin (FLOMAX) 0.4 MG CAPS capsule Take 0.4 mg by mouth every evening.  torsemide (DEMADEX) 20 MG tablet TAKE 3 TABLETS BY MOUTH EVERY DAY   VITAMIN D PO Take 1 capsule by mouth daily.   WIXELA INHUB 100-50 MCG/DOSE AEPB Inhale 1 puff into the lungs 2 (two) times daily.   XARELTO 15 MG TABS tablet TAKE 1 TABLET (15 MG TOTAL) BY MOUTH DAILY WITH SUPPER     Allergies:   Indomethacin   Social History   Socioeconomic History   Marital status: Widowed    Spouse name: Not on file   Number of children: Not on file   Years of education: Not on file   Highest education level: Not on file  Occupational History   Not on file  Tobacco Use   Smoking status: Never   Smokeless tobacco: Never   Tobacco comments:    Never smoke 07/15/21  Vaping Use   Vaping Use: Never used  Substance and Sexual Activity   Alcohol use: Never   Drug use: Never   Sexual activity: Not Currently  Other Topics Concern   Not on file  Social History Narrative   Not on file   Social Determinants of Health   Financial  Resource Strain: Not on file  Food Insecurity: Not on file  Transportation Needs: Not on file  Physical Activity: Not on file  Stress: Not on file  Social Connections: Not on file     Family History: The patient's family history includes CVA in his father; Coronary artery disease in his mother.  ROS:   Please see the history of present illness.    + fatigue + DOE All other systems reviewed and are negative.  Labs/Other Studies Reviewed:    The following studies were reviewed today:  Carotid Duplex 04/01/2021  Right Carotid: Velocities in the right ICA are consistent with a 1-39%  stenosis.   Left Carotid: Velocities in the left ICA are consistent with a 1-39%  stenosis.   Vertebrals: Bilateral vertebral arteries demonstrate antegrade flow.  Subclavians: Right subclavian artery was stenotic. Right subclavian artery  flow              was disturbed. Normal flow hemodynamics were seen in the left               subclavian artery.   *See table(s) above for measurements and observations.    Echo 06/18/19 1. Left ventricular ejection fraction, by estimation, is 60 to 65%. The  left ventricle has normal function. The left ventricle has no regional  wall motion abnormalities. The left ventricular internal cavity size was  mildly dilated. There is moderate  concentric left ventricular hypertrophy. Left ventricular diastolic  parameters are indeterminate.   2. Right ventricular systolic function is normal. The right ventricular  size is severely enlarged. There is mildly elevated pulmonary artery  systolic pressure. The estimated right ventricular systolic pressure is  93.7 mmHg.   3. Left atrial size was moderately dilated.   4. Right atrial size was moderately dilated.   5. The mitral valve is normal in structure and function. Trivial mitral  valve regurgitation. No evidence of mitral stenosis.   6. Tricuspid valve regurgitation is mild to moderate.   7. The aortic valve is  tricuspid. Aortic valve regurgitation is not  visualized. Mild aortic valve sclerosis is present, with no evidence of  aortic valve stenosis.   8. The inferior vena cava is dilated in size with <50% respiratory  variability, suggesting right atrial pressure of 15 mmHg.   Atrial Flutter Ablation 02/26/2018  CONCLUSIONS:   1. Isthmus-dependent right atrial flutter upon presentation.   2. Successful radiofrequency ablation of atrial flutter along the cavotricuspid isthmus with complete bidirectional isthmus block achieved.   3. No inducible arrhythmias following ablation.   4. No early apparent complications.    Atrial Fibrillation Ablation 12/13/2017 CONCLUSIONS: 1. Atrial fibrillation upon presentation.   2. Successful electrical isolation and anatomical encircling of all four pulmonary veins with radiofrequency current.  A WACA approach was used 3. Additional left atrial ablation was performed across the roof of the left atrium 4. Atrial fibrillation successfully cardioverted to sinus rhythm. 5. No early apparent complications.  V Tach Ablation 03/30/2015 CONCLUSIONS: 1. Sinus rhythm upon presentation   2. PVCs from the LVOT inferior the aortic valve 3. No early apparent complications.  Recent Labs: 11/24/2021: ALT 32; BUN 34; Creatinine, Ser 3.14; Potassium 4.4; Sodium 138 02/28/2022: TSH 0.021  Recent Lipid Panel    Component Value Date/Time   CHOL 119 04/05/2021 0925   TRIG 82 04/05/2021 0925   HDL 48 04/05/2021 0925   CHOLHDL 2.5 04/05/2021 0925   LDLCALC 55 04/05/2021 0925     Risk Assessment/Calculations:    CHA2DS2-VASc Score = 4   This indicates a 4.8% annual risk of stroke. The patient's score is based upon: CHF History: 1 HTN History: 1 Diabetes History: 0 Stroke History: 0 Vascular Disease History: 0 Age Score: 2 Gender Score: 0    Physical Exam:    VS:  BP (!) 122/48   Pulse (!) 54   Ht '5\' 9"'$  (1.753 m)   Wt 271 lb (122.9 kg)   SpO2 94%   BMI  40.02 kg/m     Wt Readings from Last 3 Encounters:  04/04/22 271 lb (122.9 kg)  02/28/22 266 lb 12.8 oz (121 kg)  11/24/21 265 lb (120.2 kg)     GEN: Obese, well developed in no acute distress HEENT: Normal NECK: No JVD; No carotid bruits CARDIAC: Distant, irregular RR, 2/6 systolic murmur. No rubs, gallops RESPIRATORY:  Clear to auscultation without rales, wheezing or rhonchi  ABDOMEN: Soft, non-tender, protuberant MUSCULOSKELETAL:  No edema; No deformity. 2+ pedal pulses, equal bilaterally SKIN: Warm and dry NEUROLOGIC:  Alert and oriented x 3 PSYCHIATRIC:  Normal affect   EKG:  EKG is not ordered today.    Diagnoses:    1. Persistent atrial fibrillation (HCC)   2. Other fatigue   3. DOE (dyspnea on exertion)   4. Chronic kidney disease, stage IV (severe) ([place])   5. Secondary hypercoagulable state ([place])   6. BMI 40.0-44.9, adult ([place])   7. Murmur, cardiac    Assessment and Plan:     Dyspnea on exertion: Worsening shortness of breath over the past several weeks as well as fatigue. No chest pain. Mild bilateral LE edema that improves overnight, and worsening activity intolerance. No orthopnea, PND. Compliant with CPAP. Obesity may be contributing, however with no recent testing of LV function, structural heart disease, we will get echo for further evaluation. Also consideration for angina equivalent.  Will get Lexiscan Myoview to evaluate for ischemia.  Murmur: Soft systolic murmur on exam.  Will get echocardiogram to evaluate for structural heart disease.  Fatigue: Recent increase in symptom of fatigue. Follow-up for abnormal TSH with PCP, stable and he was advised they would continue to follow. As noted above will get Lexiscan Myoview to evaluate for ischemia as well as echocardiogram to evaluate heart and valve function.  Persistent atrial fibrillation on chronic anticoagulation: Increased  DOE and fatigue recently. Does not feel symptoms are 2/2 to a fib since it has been  persistent. HR is stable. No palpitations, chest pain. No bleeding problems on Xarelto.  Continue amiodarone, Xarelto.  Obesity: Continue to work on diet and weight loss.   CKD Stage 4: Per record review creatinine 2.2 -2.7 since 2022. Managed by Dr. Marval Regal, nephrololgy. No medication changes today.   Hypertension: BP is well-controlled. No medication changes today.   Hyperlipidemia LDL goal < 70: LDL 55 on 04/05/21. Continue atorvastatin  Shared Decision Making/Informed Consent The risks [chest pain, shortness of breath, cardiac arrhythmias, dizziness, blood pressure fluctuations, myocardial infarction, stroke/transient ischemic attack, nausea, vomiting, allergic reaction, radiation exposure, metallic taste sensation and life-threatening complications (estimated to be 1 in 10,000)], benefits (risk stratification, diagnosing coronary artery disease, treatment guidance) and alternatives of a nuclear stress test were discussed in detail with Mr. Mchargue and he agrees to proceed.   Disposition: 2 months with me  Medication Adjustments/Labs and Tests Ordered: Current medicines are reviewed at length with the patient today.  Concerns regarding medicines are outlined above.  Orders Placed This Encounter  Procedures   Cardiac Stress Test: Informed Consent Details: Physician/Practitioner Attestation; Transcribe to consent form and obtain patient signature   Myocardial Perfusion Imaging   ECHOCARDIOGRAM COMPLETE   No orders of the defined types were placed in this encounter.   Patient Instructions  Medication Instructions:   Your physician recommends that you continue on your current medications as directed. Please refer to the Current Medication list given to you today.   *If you need a refill on your cardiac medications before your next appointment, please call your pharmacy*   Lab Work:  None ordered.  If you have labs (blood work) drawn today and your tests are completely  normal, you will receive your results only by: [place] (if you have MyChart) OR A paper copy in the mail If you have any lab test that is abnormal or we need to change your treatment, we will call you to review the results.   Testing/Procedures:  Your physician has requested that you have an echocardiogram. Echocardiography is a painless test that uses sound waves to create images of your heart. It provides your doctor with information about the size and shape of your heart and how well your heart's chambers and valves are working. This procedure takes approximately one hour. There are no restrictions for this procedure. Please do NOT wear cologne, perfume, aftershave, or lotions (deodorant is allowed). Please arrive 15 minutes prior to your appointment time.  You are scheduled for a Myocardial Perfusion Imaging Study on Friday, December 8 at 10:30 am.    Please arrive 15 minutes prior to your appointment time for registration and insurance purposes.   The test will take approximately 3 to 4 hours to complete; you may bring reading material. If someone comes with you to your appointment, they will need to remain in the main lobby due to limited space in the testing area.   How to prepare for your Myocardial Perfusion test:   Do not eat or drink 3 hours prior to your test, except you may have water.    Do not consume products containing caffeine (regular or decaffeinated) 12 hours prior to your test (ex: coffee, chocolate, soda, tea)   Do bring a list of your current medications with you. If not listed below, you may take your medications as normal.   Bring any held medication to your appointment,  as you may be required to take it once the test is complete.   Do wear comfortable clothes (no overalls) and walking shoes. Tennis shoes are preferred. No open toed shoes.  Do not wear cologne,aftershave or lotions (deodorant is allowed).   If these instructions are not followed, you  test will have to be rescheduled.   Please report to [address] for your test. If you have questions or concerns about your appointment, please call the Nuclear Lab at [phone].  If you cannot keep your appointment, please provide 24 hour notification to the Nuclear lab to avoid a possible $50 charge to your account.    Follow-Up: At [facility], you and your health needs are our priority.  As part of our continuing mission to provide you with exceptional heart care, we have created designated Provider Care Teams.  These Care Teams include your primary Cardiologist (physician) and Advanced Practice Providers (APPs -  Physician Assistants and Nurse Practitioners) who all work together to provide you with the care you need, when you need it.  We recommend signing up for the patient portal called "MyChart".  Sign up information is provided on this After Visit Summary.  MyChart is used to connect with patients for Virtual Visits (Telemedicine).  Patients are able to view lab/test results, encounter notes, upcoming appointments, etc.  Non-urgent messages can be sent to your provider as well.   To learn more about what you can do with MyChart, go to [email or web address].    Your next appointment:   2 month(s)  The format for your next appointment:   In Person  Provider:   Christen Bame, NP         Important Information About Sugar         Signed, Emmaline Life, NP  04/04/2022 7:10 PM    [place]

## 2022-04-04 ENCOUNTER — Encounter: Payer: Self-pay | Admitting: Nurse Practitioner

## 2022-04-04 ENCOUNTER — Ambulatory Visit: Payer: Medicare PPO | Attending: Nurse Practitioner | Admitting: Nurse Practitioner

## 2022-04-04 VITALS — BP 122/48 | HR 54 | Ht 69.0 in | Wt 271.0 lb

## 2022-04-04 DIAGNOSIS — D6869 Other thrombophilia: Secondary | ICD-10-CM

## 2022-04-04 DIAGNOSIS — R011 Cardiac murmur, unspecified: Secondary | ICD-10-CM | POA: Diagnosis not present

## 2022-04-04 DIAGNOSIS — I4819 Other persistent atrial fibrillation: Secondary | ICD-10-CM

## 2022-04-04 DIAGNOSIS — R5383 Other fatigue: Secondary | ICD-10-CM

## 2022-04-04 DIAGNOSIS — N184 Chronic kidney disease, stage 4 (severe): Secondary | ICD-10-CM | POA: Diagnosis not present

## 2022-04-04 DIAGNOSIS — Z6841 Body Mass Index (BMI) 40.0 and over, adult: Secondary | ICD-10-CM | POA: Diagnosis not present

## 2022-04-04 DIAGNOSIS — R0609 Other forms of dyspnea: Secondary | ICD-10-CM | POA: Diagnosis not present

## 2022-04-04 NOTE — Patient Instructions (Signed)
Medication Instructions:   Your physician recommends that you continue on your current medications as directed. Please refer to the Current Medication list given to you today.   *If you need a refill on your cardiac medications before your next appointment, please call your pharmacy*   Lab Work:  None ordered.  If you have labs (blood work) drawn today and your tests are completely normal, you will receive your results only by: [place] (if you have MyChart) OR A paper copy in the mail If you have any lab test that is abnormal or we need to change your treatment, we will call you to review the results.   Testing/Procedures:  Your physician has requested that you have an echocardiogram. Echocardiography is a painless test that uses sound waves to create images of your heart. It provides your doctor with information about the size and shape of your heart and how well your heart's chambers and valves are working. This procedure takes approximately one hour. There are no restrictions for this procedure. Please do NOT wear cologne, perfume, aftershave, or lotions (deodorant is allowed). Please arrive 15 minutes prior to your appointment time.  You are scheduled for a Myocardial Perfusion Imaging Study on Friday, December 8 at 10:30 am.    Please arrive 15 minutes prior to your appointment time for registration and insurance purposes.   The test will take approximately 3 to 4 hours to complete; you may bring reading material. If someone comes with you to your appointment, they will need to remain in the main lobby due to limited space in the testing area.   How to prepare for your Myocardial Perfusion test:   Do not eat or drink 3 hours prior to your test, except you may have water.    Do not consume products containing caffeine (regular or decaffeinated) 12 hours prior to your test (ex: coffee, chocolate, soda, tea)   Do bring a list of your current medications with you. If not  listed below, you may take your medications as normal.   Bring any held medication to your appointment, as you may be required to take it once the test is complete.   Do wear comfortable clothes (no overalls) and walking shoes. Tennis shoes are preferred. No open toed shoes.  Do not wear cologne,aftershave or lotions (deodorant is allowed).   If these instructions are not followed, you test will have to be rescheduled.   Please report to [address] for your test. If you have questions or concerns about your appointment, please call the Nuclear Lab at [phone].  If you cannot keep your appointment, please provide 24 hour notification to the Nuclear lab to avoid a possible $50 charge to your account.    Follow-Up: At [facility], you and your health needs are our priority.  As part of our continuing mission to provide you with exceptional heart care, we have created designated Provider Care Teams.  These Care Teams include your primary Cardiologist (physician) and Advanced Practice Providers (APPs -  Physician Assistants and Nurse Practitioners) who all work together to provide you with the care you need, when you need it.  We recommend signing up for the patient portal called "MyChart".  Sign up information is provided on this After Visit Summary.  MyChart is used to connect with patients for Virtual Visits (Telemedicine).  Patients are able to view lab/test results, encounter notes, upcoming appointments, etc.  Non-urgent messages can be sent to your  provider as well.   To learn more about what you can do with MyChart, go to [email or web address].    Your next appointment:   2 month(s)  The format for your next appointment:   In Person  Provider:   Christen Bame, NP         Important Information About Sugar

## 2022-04-05 ENCOUNTER — Ambulatory Visit (HOSPITAL_COMMUNITY): Payer: Medicare PPO | Attending: Cardiovascular Disease

## 2022-04-05 DIAGNOSIS — R0609 Other forms of dyspnea: Secondary | ICD-10-CM | POA: Insufficient documentation

## 2022-04-05 DIAGNOSIS — I4819 Other persistent atrial fibrillation: Secondary | ICD-10-CM | POA: Diagnosis not present

## 2022-04-05 DIAGNOSIS — R5383 Other fatigue: Secondary | ICD-10-CM | POA: Insufficient documentation

## 2022-04-05 LAB — ECHOCARDIOGRAM COMPLETE
AR max vel: 0.97 cm2
AV Area VTI: 1.21 cm2
AV Area mean vel: 1.09 cm2
AV Mean grad: 19 mmHg
AV Peak grad: 36 mmHg
Ao pk vel: 3 m/s
Area-P 1/2: 2.43 cm2
S' Lateral: 4.2 cm

## 2022-04-07 ENCOUNTER — Telehealth (HOSPITAL_COMMUNITY): Payer: Self-pay | Admitting: *Deleted

## 2022-04-07 ENCOUNTER — Other Ambulatory Visit: Payer: Self-pay | Admitting: *Deleted

## 2022-04-07 DIAGNOSIS — I071 Rheumatic tricuspid insufficiency: Secondary | ICD-10-CM

## 2022-04-07 DIAGNOSIS — I350 Nonrheumatic aortic (valve) stenosis: Secondary | ICD-10-CM

## 2022-04-07 NOTE — Telephone Encounter (Signed)
Spoke with Mark Singh on the phone and he informed me that his STRESS TEST had been cancelled by the doctor's office. He stated that he was told that the stress test was no longer needed.

## 2022-04-08 ENCOUNTER — Encounter (HOSPITAL_COMMUNITY): Payer: Medicare PPO

## 2022-04-16 ENCOUNTER — Other Ambulatory Visit: Payer: Self-pay | Admitting: Cardiology

## 2022-04-23 ENCOUNTER — Other Ambulatory Visit: Payer: Self-pay | Admitting: Cardiology

## 2022-04-26 NOTE — Progress Notes (Unsigned)
Patient ID: Mark Singh MRN: [id number] DOB/AGE: March 21, 1946 76 y.o.  Primary Care Physician:Miller, Lattie Haw, MD Primary Cardiologist: [name], MD  FOCUSED CARDIOVASCULAR PROBLEM LIST:   1.  Paradoxical low-flow low gradient aortic stenosis (stage D3) with an aortic valve area of around 1 cm with mean gradient of 19 mmHg, V-max of 3 m/s with stroke-volume index of 29 was per meter squared with ejection fraction 55 to 60%; EKG with sinus bradycardia but no bundle-branch blocks 2.  Atrial fibrillation on anticoagulation 3.  PVC ablation 2016; atrial fibrillation ablation August 2019; atrial flutter ablation October 2019 4.  Hypertension 5.  Hyperlipidemia 6.  CKD stage IV; followed by nephrology (Dr. Marval Regal) 7.  Type 2 diabetes on insulin  HISTORY OF PRESENT ILLNESS: The patient is a 76 y.o. male with the indicated medical history here for recommendations regarding his aortic stenosis and tricuspid regurgitation.  He was seen by cardiology in early December due to worsening shortness of breath.  He usually is able to walk a mile however he has been able to do this over the last several weeks.  The patient has noticed increasing fatigue and shortness of breath with walking.  He has had no presyncope or syncope.  He is also noted some increasing peripheral edema.  He denies any orthopnea.  He has had no severe bleeding or bruising episodes while on Xarelto.  He is able to do most of his activities of daily living but he likes to exercise 3-4 times a week and has noticed increasing fatigue and shortness of breath with this activity that he enjoys.  He fortunately has not required any emergency room visits or hospitalizations for breathing issues or volume status.  He is retired for 8 years.  He was in [employer].  He does not smoke.  He sees a Pharmacist, community on a regular basis and reports good dental health.  Past Medical History:  Diagnosis Date   Arthritis    "hands,  feet" (12/13/2017)   Asthma    Bradycardia    a. Repoted h/o HR in the 40s.   Edema    a. 2D echo 11/2013: EF 19-41%, LV diastolic function parameters were normal, severely dilated LA, PASP 22mHg..   H/O: gout    right foot; on daily RX" (12/13/2017)   HTN (hypertension)    Hyperlipidemia    Myalgia and myositis, unspecified    Nephrotic syndrome    a. responder to steroid therapy by Dr. DLorrene Reid    Obesity    Orthostatic hypotension    Penile cancer ([facility]    "did circ"   Pulmonary hypertension ([place]    a. Mild pulm HTN felt 2/2 obesity.   PVC's (premature ventricular contractions)    a. Holter 05/2013 showing 18k PVCs (18% of the time).   Seasonal asthma    mild   Shoulder pain    Squamous carcinoma    "right hand; left shoulder" (12/13/2017)   Type 2 diabetes mellitus ([place]     Past Surgical History:  Procedure Laterality Date   A-FLUTTER ABLATION N/A 02/23/2018   Procedure: A-FLUTTER ABLATION;  Surgeon: CConstance Haw MD;  [facility];  Service: Cardiovascular;  Laterality: N/A;   ATRIAL FIBRILLATION ABLATION N/A 12/13/2017   Procedure: ATRIAL FIBRILLATION ABLATION;  Surgeon: CConstance Haw MD;  [facility];  Service: Cardiovascular;  Laterality: N/A;   BUNIONECTOMY WITH HAMMERTOE RECONSTRUCTION Left 1992  &  2008   REMOVAL HEAL  SPUR/ BUNIONECTOMY AND HAMMERTOE REPAIR    CARDIOVERSION N/A 07/22/2015   Procedure: CARDIOVERSION;  Surgeon: Dorothy Spark, MD;  Location: [place];  Service: Cardiovascular;  Laterality: N/A;   CARDIOVERSION N/A 10/06/2015   Procedure: CARDIOVERSION;  Surgeon: Sueanne Margarita, MD;  Location: [place];  Service: Cardiovascular;  Laterality: N/A;   CARDIOVERSION N/A 12/05/2016   Procedure: CARDIOVERSION;  Surgeon: Pixie Casino, MD;  Location: [facility] ENDOSCOPY;  Service: Cardiovascular;  Laterality: N/A;   CARDIOVERSION N/A 06/29/2018   Procedure: CARDIOVERSION;  Surgeon: [name] Wonda Cheng, MD;   Location: [facility] ENDOSCOPY;  Service: Cardiovascular;  Laterality: N/A;   CARDIOVERSION N/A 09/20/2018   Procedure: CARDIOVERSION;  Surgeon: [name], MD;  Location: [place];  Service: Cardiovascular;  Laterality: N/A;   CARDIOVERSION N/A 03/24/2021   Procedure: CARDIOVERSION;  Surgeon: Freada Bergeron, MD;  Location: [place];  Service: Cardiovascular;  Laterality: N/A;   CIRCUMCISION N/A 11/27/2012   Procedure:  CIRCUMCISION  AND EXCISION OF GLANS PENIS;  Surgeon: Fredricka Bonine, MD;  Location[facility];  Service: Urology;  Laterality: N/A;   ELECTROPHYSIOLOGIC STUDY N/A 03/30/2015   Procedure: PVC Ablation;  Surgeon: [name], MD;  Location: [place] [facility];  Service: Cardiovascular;  Laterality: N/A;   HERNIA REPAIR     SQUAMOUS CELL CARCINOMA EXCISION     "right hand; left shoulder" (12/13/4816)   UMBILICAL HERNIA REPAIR  11-04-2005    Family History  Problem Relation Age of Onset   Coronary artery disease Mother    CVA Father     Social History   Socioeconomic History   Marital status: Widowed    Spouse name: Not on file   Number of children: Not on file   Years of education: Not on file   Highest education level: Not on file  Occupational History   Not on file  Tobacco Use   Smoking status: Never   Smokeless tobacco: Never   Tobacco comments:    Never smoke 07/15/21  Vaping Use   Vaping Use: Never used  Substance and Sexual Activity   Alcohol use: Never   Drug use: Never   Sexual activity: Not Currently  Other Topics Concern   Not on file  Social History Narrative   Not on file   Social Determinants of Health   Financial Resource Strain: Not on file  Food Insecurity: Not on file  Transportation Needs: Not on file  Physical Activity: Not on file  Stress: Not on file  Social Connections: Not on file  Intimate Partner Violence: Not on file     Prior to Admission medications   Medication Sig Start  Date End Date Taking? Authorizing Provider  acetaminophen (TYLENOL) 500 MG tablet Take 1,000 mg by mouth every 6 (six) hours as needed for moderate pain.    [provider]  allopurinol (ZYLOPRIM) 100 MG tablet Take 50 mg by mouth every other day.    [provider]  amiodarone (PACERONE) 200 MG tablet Take 0.5 tablets (100 mg total) by mouth daily. 03/02/22   Camnitz, Will Hassell Done, MD  amLODipine (NORVASC) 10 MG tablet TAKE 1 TABLET BY MOUTH EVERY DAY 08/24/20   Baldwin Jamaica, PA-C  atorvastatin (LIPITOR) 40 MG tablet Take 40 mg by mouth daily.    [provider]  BD PEN NEEDLE NANO 2ND GEN 32G X 4 MM MISC  12/23/19   [provider]  betamethasone valerate ointment (VALISONE) 0.1 % 1 application 2 (  two) times daily as needed (precancerous spots). 11/25/19   [provider]  Cyanocobalamin (B-12 PO) Take 1 capsule by mouth daily.    [provider]  Dulaglutide (TRULICITY) 1.5 EX/5.2WU SOPN Inject 1.5 mg into the skin every Wednesday.    [provider]  hydrALAZINE (APRESOLINE) 50 MG tablet Take 1 tablet (50 mg total) by mouth 2 (two) times daily. Please contact the office to schedule appointment for additional refills. 03/15/22   Jerline Pain, MD  KLOR-CON M20 20 MEQ tablet TAKE 1 TABLET BY MOUTH EVERY DAY 05/18/21   Jerline Pain, MD  LANTUS SOLOSTAR 100 UNIT/ML Solostar Pen Inject 30 Units into the skin at bedtime. 02/08/18   [provider]  losartan (COZAAR) 50 MG tablet TAKE 1 TABLET BY MOUTH EVERY DAY 04/01/22   Jerline Pain, MD  omeprazole (PRILOSEC) 20 MG capsule Take 20 mg by mouth daily. 06/25/18   [provider]  OneTouch Delica Lancets 13K MISC USE AS DIRECTED EVERY DAY 12/16/18   [provider]  Glory Rosebush VERIO test strip USE AS DIRECTED (TEST DAILY) 90 **E11.9 01/20/19   [provider]  oxymetazoline (AFRIN) 0.05 % nasal spray Place 1 spray into both nostrils 2 (two) times daily as  needed for congestion.    [provider]  pimecrolimus (ELIDEL) 1 % cream Apply 1 application topically 2 (two) times daily as needed (psoriasis).     [provider]  spironolactone (ALDACTONE) 25 MG tablet Take 0.5 tablets (12.5 mg total) by mouth daily. 04/18/22   Jerline Pain, MD  tamsulosin (FLOMAX) 0.4 MG CAPS capsule Take 0.4 mg by mouth every evening.  03/26/16   [provider]  torsemide (DEMADEX) 20 MG tablet TAKE 3 TABLETS BY MOUTH EVERY DAY 05/18/21   Jerline Pain, MD  VITAMIN D PO Take 1 capsule by mouth daily.    [provider]  Grant Ruts INHUB 100-50 MCG/DOSE AEPB Inhale 1 puff into the lungs 2 (two) times daily. 09/28/18   [provider]  XARELTO 15 MG TABS tablet TAKE 1 TABLET (15 MG TOTAL) BY MOUTH DAILY WITH SUPPER 11/26/21   Jerline Pain, MD    Allergies  Allergen Reactions   Indomethacin Other (See Comments)    dizziness    REVIEW OF SYSTEMS:  General: no fevers/chills/night sweats Eyes: no blurry vision, diplopia, or amaurosis ENT: no sore throat or hearing loss Resp: no cough, wheezing, or hemoptysis CV: no edema or palpitations GI: no abdominal pain, nausea, vomiting, diarrhea, or constipation GU: no dysuria, frequency, or hematuria Skin: no rash Neuro: no headache, numbness, tingling, or weakness of extremities Musculoskeletal: no joint pain or swelling Heme: no bleeding, DVT, or easy bruising Endo: no polydipsia or polyuria  BP 130/72   Pulse (!) 59   Ht '5\' 9"'$  (1.753 m)   Wt 275 lb 3.2 oz (124.8 kg)   SpO2 96%   BMI 40.64 kg/m   PHYSICAL EXAM: GEN:  AO x 3 in no acute distress HEENT: normal Dentition: Normal Neck: JVP normal. +2 carotid upstrokes without bruits. No thyromegaly. Lungs: equal expansion, clear bilaterally CV: Apex is discrete and nondisplaced, RRR with 2/6 SEM radiating to carotids Abd: soft, non-tender, non-distended; no bruit; positive bowel sounds Ext: no edema, ecchymoses, or  cyanosis Vascular: 2+ femoral pulses, 2+ radial pulses       Skin: warm and dry without rash Neuro: CN II-XII grossly intact; motor and sensory grossly intact    DATA  AND STUDIES:  EKG: Sinus bradycardia without bundle blocks  2D ECHO: 2023 1. Left ventricular ejection fraction, by estimation, is 55 to 60%. The  left ventricle has normal function. The left ventricle has no regional  wall motion abnormalities. Left ventricular diastolic parameters are  consistent with Grade II diastolic  dysfunction (pseudonormalization). The average left ventricular global  longitudinal strain is -23.7 %. The global longitudinal strain is normal.   2. Right ventricular systolic function is normal. The right ventricular  size is moderately enlarged. There is moderately elevated pulmonary artery  systolic pressure. The estimated right ventricular systolic pressure is  32.9 mmHg.   3. Left atrial size was severely dilated.   4. Right atrial size was severely dilated.   5. The mitral valve is normal in structure. Mild mitral valve  regurgitation. No evidence of mitral stenosis.   6. Tricuspid valve regurgitation is moderate to severe.   7. The aortic valve is tricuspid. There is mild calcification of the  aortic valve. There is moderate thickening of the aortic valve. Aortic  valve regurgitation is not visualized. Moderate aortic valve stenosis.   8. The inferior vena cava is dilated in size with >50% respiratory  variability, suggesting right atrial pressure of 8 mmHg.   CARDIAC CATH: n/a  STS RISK CALCULATOR: pending  NHYA CLASS: 2    ASSESSMENT AND PLAN:   Aortic valve stenosis, etiology of cardiac valve disease unspecified - Plan: Basic metabolic panel  Tricuspid valve insufficiency, unspecified etiology - Plan: Basic metabolic panel  Persistent atrial fibrillation ([place]) - Plan: Basic metabolic panel  BMI 92.4-26.8, adult ([place]) - Plan: Basic metabolic panel  Chronic kidney disease,  stage IV (severe) ([place]) - Plan: Basic metabolic panel  Pre-procedural cardiovascular examination - Plan: Basic metabolic panel  I reviewed the patient's echocardiogram which demonstrates that calcified valve with restricted motion.  I did see 1 view of his tricuspid regurgitation with four-chamber view that does show some color flow and suggest to me that the tricuspid agitation may be moderate or moderate to severe.  The patient has developed symptomatic paradoxical low-flow low gradient aortic stenosis stage D3 and is interested in pursuing intervention.  I had a long conversation with the patient especially considering his stage IV kidney disease.  He does run the risk of further deterioration of his kidney function with a TAVR protocol CTA, coronary angiography study, and the TAVR procedure (and would likely require dialysis if surgical aortic valve replacement is pursued).  Given his lack of angina and his chronic kidney disease I would like to obtain the TAVR protocol CTA first and see if we can assess his proximal coronary arteries with this study which would then obviate the need for coronary angiography.  If this is not possible we will then proceed with a coronary angiography and right heart catheterization study scheduled a few weeks after his TAVR protocol CTA to allow for renal recovery.  We will have him see cardiothoracic surgery for surgical opinion as well.  I will forward my note to the patient's nephrologist as well; the patient is scheduled to see his nephrologist next month.  The patient sees a dentist on a regular basis so no dental evaluation is required at this time.  I have personally reviewed the patients imaging data as summarized above.  I have reviewed the natural history of aortic stenosis with the patient and family members who are present today. We have discussed the limitations of medical therapy and the poor prognosis  associated with symptomatic aortic stenosis. We have also  reviewed potential treatment options, including palliative medical therapy, conventional surgical aortic valve replacement, and transcatheter aortic valve replacement. We discussed treatment options in the context of this patient's specific comorbid medical conditions.   All of the patient's questions were answered today. Will make further recommendations based on the results of studies outlined above.   Total time spent with patient today 60 minutes. This includes reviewing records, evaluating the patient and coordinating care.   Early Osmond, MD  04/27/2022 9:47 AM    [address], [place], [place]  [address] Phone: [phone]; Fax: [phone]

## 2022-04-27 ENCOUNTER — Other Ambulatory Visit: Payer: Self-pay | Admitting: Physician Assistant

## 2022-04-27 ENCOUNTER — Ambulatory Visit: Payer: Medicare PPO | Attending: Internal Medicine | Admitting: Internal Medicine

## 2022-04-27 ENCOUNTER — Encounter: Payer: Self-pay | Admitting: Internal Medicine

## 2022-04-27 ENCOUNTER — Encounter: Payer: Self-pay | Admitting: Physician Assistant

## 2022-04-27 VITALS — BP 130/72 | HR 59 | Ht 69.0 in | Wt 275.2 lb

## 2022-04-27 DIAGNOSIS — Z01810 Encounter for preprocedural cardiovascular examination: Secondary | ICD-10-CM

## 2022-04-27 DIAGNOSIS — N184 Chronic kidney disease, stage 4 (severe): Secondary | ICD-10-CM

## 2022-04-27 DIAGNOSIS — Z6841 Body Mass Index (BMI) 40.0 and over, adult: Secondary | ICD-10-CM

## 2022-04-27 DIAGNOSIS — I350 Nonrheumatic aortic (valve) stenosis: Secondary | ICD-10-CM

## 2022-04-27 DIAGNOSIS — I071 Rheumatic tricuspid insufficiency: Secondary | ICD-10-CM | POA: Diagnosis not present

## 2022-04-27 DIAGNOSIS — I4819 Other persistent atrial fibrillation: Secondary | ICD-10-CM

## 2022-04-27 LAB — BASIC METABOLIC PANEL
BUN/Creatinine Ratio: 14 (ref 10–24)
BUN: 33 mg/dL — ABNORMAL HIGH (ref 8–27)
CO2: 29 mmol/L (ref 20–29)
Calcium: 9.4 mg/dL (ref 8.6–10.2)
Chloride: 99 mmol/L (ref 96–106)
Creatinine, Ser: 2.32 mg/dL — ABNORMAL HIGH (ref 0.76–1.27)
Glucose: 110 mg/dL — ABNORMAL HIGH (ref 70–99)
Potassium: 4.6 mmol/L (ref 3.5–5.2)
Sodium: 140 mmol/L (ref 134–144)
eGFR: 28 mL/min/{1.73_m2} — ABNORMAL LOW (ref >59)

## 2022-04-27 NOTE — Progress Notes (Signed)
Pre Surgical Assessment: 5 M Walk Test  43M=16.82f  5 Meter Walk Test- trial 1: 5.25 seconds 5 Meter Walk Test- trial 2: 5.19 seconds 5 Meter Walk Test- trial 3: 4.99 seconds 5 Meter Walk Test Average: 5.14 seconds

## 2022-04-27 NOTE — Patient Instructions (Addendum)
Medication Instructions:  Your physician recommends that you continue on your current medications as directed. Please refer to the Current Medication list given to you today.  *If you need a refill on your cardiac medications before your next appointment, please call your pharmacy*   Lab Work: BMET today If you have labs (blood work) drawn today and your tests are completely normal, you will receive your results only by: [place] (if you have MyChart) OR A paper copy in the mail If you have any lab test that is abnormal or we need to change your treatment, we will call you to review the results.   Testing/Procedures: TAVR CT's (you will be called to schedule)  Ambulatory Referral to TCTS  Follow-Up: At [facility], you and your health needs are our priority.  As part of our continuing mission to provide you with exceptional heart care, we have created designated Provider Care Teams.  These Care Teams include your primary Cardiologist (physician) and Advanced Practice Providers (APPs -  Physician Assistants and Nurse Practitioners) who all work together to provide you with the care you need, when you need it.  We recommend signing up for the patient portal called "MyChart".  Sign up information is provided on this After Visit Summary.  MyChart is used to connect with patients for Virtual Visits (Telemedicine).  Patients are able to view lab/test results, encounter notes, upcoming appointments, etc.  Non-urgent messages can be sent to your provider as well.   To learn more about what you can do with MyChart, go to [email or web address].    Your next appointment:   Structural Team will follow-up  The format for your next appointment:   In Person  Provider:   Lenna Sciara, MD      Important Information About Sugar

## 2022-05-03 ENCOUNTER — Telehealth: Payer: Self-pay | Admitting: Physician Assistant

## 2022-05-03 ENCOUNTER — Other Ambulatory Visit: Payer: Self-pay | Admitting: Physician Assistant

## 2022-05-03 DIAGNOSIS — I071 Rheumatic tricuspid insufficiency: Secondary | ICD-10-CM

## 2022-05-03 DIAGNOSIS — I350 Nonrheumatic aortic (valve) stenosis: Secondary | ICD-10-CM

## 2022-05-03 NOTE — Telephone Encounter (Signed)
  [facility]   MULTIDISCIPLINARY HEART VALVE TEAM  Seen by Dr. Ali Lowe last week in the office for eval of severe TR and mod AS. AS felt to be more in moderate to severe range. TAVR w/u initiated. Plan was for pre TAVR CTs with SL NTG to assess prox cors. However, due to CKD stage IV with a GFR of <30, we sent to nephrology for clearance. Dr. Albertine Patricia would not clear for scans at this time due to the high likelihood of him being tipped into needing HD. I spoke to the patient who agrees and does not want to take this risk. Scans have been cancelled.   We will plan for 6 month follow up with echo. I have arranged for an echo and OV with Dr. Ali Lowe 09/19/22. He sees Sharyn Lull back in Feb. He will continue to monitor his symptoms and let us know of any clinical changes.   Angelena Form PA-C  [facility]

## 2022-05-04 ENCOUNTER — Other Ambulatory Visit: Payer: Self-pay | Admitting: Cardiology

## 2022-05-05 ENCOUNTER — Encounter (HOSPITAL_COMMUNITY): Payer: Medicare PPO

## 2022-05-05 ENCOUNTER — Ambulatory Visit (HOSPITAL_COMMUNITY): Admission: RE | Admit: 2022-05-05 | Payer: Medicare PPO | Source: Ambulatory Visit

## 2022-05-18 ENCOUNTER — Other Ambulatory Visit: Payer: Self-pay | Admitting: Cardiology

## 2022-05-25 DIAGNOSIS — N184 Chronic kidney disease, stage 4 (severe): Secondary | ICD-10-CM | POA: Diagnosis not present

## 2022-05-29 DIAGNOSIS — I10 Essential (primary) hypertension: Secondary | ICD-10-CM | POA: Diagnosis not present

## 2022-05-29 DIAGNOSIS — J45909 Unspecified asthma, uncomplicated: Secondary | ICD-10-CM | POA: Diagnosis not present

## 2022-05-29 DIAGNOSIS — G4734 Idiopathic sleep related nonobstructive alveolar hypoventilation: Secondary | ICD-10-CM | POA: Diagnosis not present

## 2022-05-29 DIAGNOSIS — G4733 Obstructive sleep apnea (adult) (pediatric): Secondary | ICD-10-CM | POA: Diagnosis not present

## 2022-05-30 ENCOUNTER — Other Ambulatory Visit: Payer: Self-pay | Admitting: Cardiology

## 2022-05-30 DIAGNOSIS — I480 Paroxysmal atrial fibrillation: Secondary | ICD-10-CM

## 2022-05-30 NOTE — Telephone Encounter (Signed)
Prescription refill request for Xarelto received.  Indication: Afib Last office visit: 04/27/22 Ali Lowe)  Weight: 124.8kg Age: 77 Scr: 2.32 (04/27/22)  CrCl: 47.07 ml/min  Appropriate dose and refill sent to requested pharmacy.

## 2022-06-01 DIAGNOSIS — E1122 Type 2 diabetes mellitus with diabetic chronic kidney disease: Secondary | ICD-10-CM | POA: Diagnosis not present

## 2022-06-01 DIAGNOSIS — I4819 Other persistent atrial fibrillation: Secondary | ICD-10-CM | POA: Diagnosis not present

## 2022-06-01 DIAGNOSIS — I5030 Unspecified diastolic (congestive) heart failure: Secondary | ICD-10-CM | POA: Diagnosis not present

## 2022-06-01 DIAGNOSIS — N041 Nephrotic syndrome with focal and segmental glomerular lesions: Secondary | ICD-10-CM | POA: Diagnosis not present

## 2022-06-01 DIAGNOSIS — N184 Chronic kidney disease, stage 4 (severe): Secondary | ICD-10-CM | POA: Diagnosis not present

## 2022-06-01 DIAGNOSIS — I130 Hypertensive heart and chronic kidney disease with heart failure and stage 1 through stage 4 chronic kidney disease, or unspecified chronic kidney disease: Secondary | ICD-10-CM | POA: Diagnosis not present

## 2022-06-01 DIAGNOSIS — Z6841 Body Mass Index (BMI) 40.0 and over, adult: Secondary | ICD-10-CM | POA: Diagnosis not present

## 2022-06-07 DIAGNOSIS — C609 Malignant neoplasm of penis, unspecified: Secondary | ICD-10-CM | POA: Diagnosis not present

## 2022-06-07 DIAGNOSIS — N480 Leukoplakia of penis: Secondary | ICD-10-CM | POA: Diagnosis not present

## 2022-06-08 DIAGNOSIS — I4891 Unspecified atrial fibrillation: Secondary | ICD-10-CM | POA: Diagnosis not present

## 2022-06-08 DIAGNOSIS — N184 Chronic kidney disease, stage 4 (severe): Secondary | ICD-10-CM | POA: Diagnosis not present

## 2022-06-08 DIAGNOSIS — M1030 Gout due to renal impairment, unspecified site: Secondary | ICD-10-CM | POA: Diagnosis not present

## 2022-06-08 DIAGNOSIS — K5900 Constipation, unspecified: Secondary | ICD-10-CM | POA: Diagnosis not present

## 2022-06-08 DIAGNOSIS — J45909 Unspecified asthma, uncomplicated: Secondary | ICD-10-CM | POA: Diagnosis not present

## 2022-06-08 DIAGNOSIS — G4733 Obstructive sleep apnea (adult) (pediatric): Secondary | ICD-10-CM | POA: Diagnosis not present

## 2022-06-08 DIAGNOSIS — M1990 Unspecified osteoarthritis, unspecified site: Secondary | ICD-10-CM | POA: Diagnosis not present

## 2022-06-08 DIAGNOSIS — Z833 Family history of diabetes mellitus: Secondary | ICD-10-CM | POA: Diagnosis not present

## 2022-06-08 DIAGNOSIS — Z8549 Personal history of malignant neoplasm of other male genital organs: Secondary | ICD-10-CM | POA: Diagnosis not present

## 2022-06-08 DIAGNOSIS — L409 Psoriasis, unspecified: Secondary | ICD-10-CM | POA: Diagnosis not present

## 2022-06-08 DIAGNOSIS — Z7985 Long-term (current) use of injectable non-insulin antidiabetic drugs: Secondary | ICD-10-CM | POA: Diagnosis not present

## 2022-06-08 DIAGNOSIS — E785 Hyperlipidemia, unspecified: Secondary | ICD-10-CM | POA: Diagnosis not present

## 2022-06-08 DIAGNOSIS — Z6839 Body mass index (BMI) 39.0-39.9, adult: Secondary | ICD-10-CM | POA: Diagnosis not present

## 2022-06-08 DIAGNOSIS — K219 Gastro-esophageal reflux disease without esophagitis: Secondary | ICD-10-CM | POA: Diagnosis not present

## 2022-06-08 DIAGNOSIS — E11620 Type 2 diabetes mellitus with diabetic dermatitis: Secondary | ICD-10-CM | POA: Diagnosis not present

## 2022-06-08 DIAGNOSIS — N400 Enlarged prostate without lower urinary tract symptoms: Secondary | ICD-10-CM | POA: Diagnosis not present

## 2022-06-12 NOTE — Progress Notes (Unsigned)
Cardiology Office Note:    Date:  06/14/2022   ID:  Mark Singh, DOB [birth year], MRN [id number]  PCP:  Kathyrn Lass, MD   [facility] Providers Cardiologist:  [name], MD Electrophysiologist:  Constance Haw, MD     Referring MD: Kathyrn Lass, MD   Chief Complaint: shortness of breath  History of Present Illness:    Mark Singh is a very pleasant 77 y.o. male with a hx of PVCs s/p ablation, nephrotic syndrome, CKD stage IV diabetes, hypertension, hyperlipidemia, morbid obesity, pulmonary hypertension, and persistent atrial fibrillation on chronic anticoagulation, OSA on CPAP  He underwent PVC ablation on 03/30/2015. PVCs were inferior to the left coronary cusp.  Following PVC ablation he went into atrial fibrillation and is post A-fib ablation 12/13/2017.  He then presented in atrial flutter and is post ablation 02/26/2018. Currently on amiodarone for atrial fibrillation.  Previous cardioversions 08/2018, 03/2021. Generally asymptomatic in A-fib.  Seen by Dr. Marlou Porch 03/29/2021 for general cardiology follow-up. Carotid ultrasound was ordered which revealed mild plaque in the carotid arteries, right subclavian artery stenotic, medical management recommended. Has struggled with weight loss and with high protein diet in the setting of CKD. Followed by nephrology.   Seen in cardiology clinic by [name] on 02/28/2022 at which time he was feeling well. He had abnormal TSH which was monitored for amiodarone therapy and was advised to follow-up with PCP.  Seen by me on 04/04/22 for evaluation of worsening shortness of breath.Reported this has worsened since he saw [name] 02/28/22. Also feeling fatigued, wants to sleep constantly. Goes to gym daily, walks 1 mile and does some weight lifting. Feels like he cannot complete a 1 mile walk for the last few weeks. Weight up and down weekly, 2-3 lbs. No edema, orthopnea, or PND. Denied chest pain, palpitations,  presyncope, syncope. Compliant on CPAP. CKD followed by Dr. Marval Regal, sees him regularly. Had repeat thyroid lab work with PCP, was told they would continue to monitor. Echo was completed on 04/05/2022 and revealed tricuspid regurgitation now moderate to severe previously mild to moderate on echo 2021. Normal LVEF, G2 DD, severe biatrial enlargement, moderate AAS with peak gradient 36 mmHg, AVA 1.21 cm.  He was referred to structural heart team for evaluation.  Seen by Dr. Ali Lowe on 04/27/22 for structural heart evaluation with plan to have pre-TAVR CTs to assess coronary anatomy.  Due to CKD stage IV with a GFR < 30, clearance was sent to nephrology.  Dr. Marval Regal would not clear him for scans at this time due to high likelihood of him being tipped into needing hemodialysis. The patient was in agreement and scans were canceled.  Plan for him to follow-up in 6 months with echo and see Dr. Ali Lowe on 09/19/22.  Today, he is here alone for follow-up. Reports he is feeling well. Continues to have shortness of breath that he feels is stable. No activity intolerance. Able to walk about 1/4 mile, sits and stretches and then resumes walk for total of 1 mile. No orthopnea, PND, edema, chest pain, or palpitations. No presyncope or syncope. He reports improvement in prior LE edema. He is overall feeling well. Asks if there will be a time he could possibly have his valve repaired.    Past Medical History:  Diagnosis Date   Arthritis    "hands, feet" (12/13/2017)   Asthma    Bradycardia    a. Repoted h/o HR in the 40s.   Edema  a. 2D echo 11/2013: EF 0000000, LV diastolic function parameters were normal, severely dilated LA, PASP 11mHg..   H/O: gout    right foot; on daily RX" (12/13/2017)   HTN (hypertension)    Hyperlipidemia    Myalgia and myositis, unspecified    Nephrotic syndrome    a. responder to steroid therapy by Dr. DLorrene Reid    Obesity    Orthostatic hypotension    Penile cancer ([place]     "did circ"   Pulmonary hypertension ([place]    a. Mild pulm HTN felt 2/2 obesity.   PVC's (premature ventricular contractions)    a. Holter 05/2013 showing 18k PVCs (18% of the time).   Seasonal asthma    mild   Shoulder pain    Squamous carcinoma    "right hand; left shoulder" (12/13/2017)   Type 2 diabetes mellitus ([place]     Past Surgical History:  Procedure Laterality Date   A-FLUTTER ABLATION N/A 02/23/2018   Procedure: A-FLUTTER ABLATION;  Surgeon: CConstance Haw MD;  [facility];  Service: Cardiovascular;  Laterality: N/A;   ATRIAL FIBRILLATION ABLATION N/A 12/13/2017   Procedure: ATRIAL FIBRILLATION ABLATION;  Surgeon: CConstance Haw MD;  [place] [facility];  Service: Cardiovascular;  Laterality: N/A;   BUNIONECTOMY WITH HAMMERTOE RECONSTRUCTION Left 1992  &  2008   REMOVAL HEAL SPUR/ BUNIONECTOMY AND HAMMERTOE REPAIR    CARDIOVERSION N/A 07/22/2015   Procedure: CARDIOVERSION;  Surgeon: KDorothy Spark MD;  [place]  Service: Cardiovascular;  Laterality: N/A;   CARDIOVERSION N/A 10/06/2015   Procedure: CARDIOVERSION;  Surgeon: TSueanne Margarita MD;  [place]  Service: Cardiovascular;  Laterality: N/A;   CARDIOVERSION N/A 12/05/2016   Procedure: CARDIOVERSION;  Surgeon: HPixie Casino MD;  [place]  Service: Cardiovascular;  Laterality: N/A;   CARDIOVERSION N/A 06/29/2018   Procedure: CARDIOVERSION;  Surgeon: NThayer Headings MD;  [place]  Service: Cardiovascular;  Laterality: N/A;   CARDIOVERSION N/A 09/20/2018   Procedure: CARDIOVERSION;  Surgeon: RSkeet Latch MD;  [place]  Service: Cardiovascular;  Laterality: N/A;   CARDIOVERSION N/A 03/24/2021   Procedure: CARDIOVERSION;  Surgeon: PFreada Bergeron MD;  [facility];  Service: Cardiovascular;  Laterality: N/A;   CIRCUMCISION N/A 11/27/2012   Procedure:  CIRCUMCISION  AND EXCISION OF GLANS  PENIS;  Surgeon: MFredricka Bonine MD;  [facility]  Service: Urology;  Laterality: N/A;   ELECTROPHYSIOLOGIC STUDY N/A 03/30/2015   Procedure: PVC Ablation;  Surgeon: Will MMeredith Leeds MD;  [place] [facility];  Service: Cardiovascular;  Laterality: N/A;   HERNIA REPAIR     SQUAMOUS CELL CARCINOMA EXCISION     "right hand; left shoulder" ([id number]   UMBILICAL HERNIA REPAIR  11-04-2005    Current Medications: Current Meds  Medication Sig   acetaminophen (TYLENOL) 500 MG tablet Take 1,000 mg by mouth every 6 (six) hours as needed for moderate pain.   allopurinol (ZYLOPRIM) 100 MG tablet Take 50 mg by mouth every other day.   amiodarone (PACERONE) 200 MG tablet Take 0.5 tablets (100 mg total) by mouth daily.   amLODipine (NORVASC) 10 MG tablet TAKE 1 TABLET BY MOUTH EVERY DAY   atorvastatin (LIPITOR) 40 MG tablet Take 40 mg by mouth daily.   BD PEN NEEDLE NANO 2ND GEN 32G X 4 MM MISC    betamethasone valerate ointment (VALISONE) 0.1 % 1 application 2 (two) times daily as  needed (precancerous spots).   hydrALAZINE (APRESOLINE) 50 MG tablet Take 1 tablet (50 mg total) by mouth 2 (two) times daily. Please contact the office to schedule appointment for additional refills.   KLOR-CON M20 20 MEQ tablet TAKE 1 TABLET BY MOUTH EVERY DAY   LANTUS SOLOSTAR 100 UNIT/ML Solostar Pen Inject 30 Units into the skin at bedtime.   losartan (COZAAR) 50 MG tablet TAKE 1 TABLET BY MOUTH EVERY DAY   omeprazole (PRILOSEC) 20 MG capsule Take 20 mg by mouth daily.   OneTouch Delica Lancets 99991111 MISC USE AS DIRECTED EVERY DAY   ONETOUCH VERIO test strip USE AS DIRECTED (TEST DAILY) 90 **E11.9   pimecrolimus (ELIDEL) 1 % cream Apply 1 application topically 2 (two) times daily as needed (psoriasis).    spironolactone (ALDACTONE) 25 MG tablet Take 0.5 tablets (12.5 mg total) by mouth daily.   tamsulosin (FLOMAX) 0.4 MG CAPS capsule Take 0.4 mg by mouth every evening.     torsemide (DEMADEX) 20 MG tablet TAKE 3 TABLETS BY MOUTH EVERY DAY   WIXELA INHUB 100-50 MCG/DOSE AEPB Inhale 1 puff into the lungs 2 (two) times daily.   XARELTO 15 MG TABS tablet TAKE 1 TABLET (15 MG TOTAL) BY MOUTH DAILY WITH SUPPER     Allergies:   Indomethacin   Social History   Socioeconomic History   Marital status: Widowed    Spouse name: Not on file   Number of children: Not on file   Years of education: Not on file   Highest education level: Not on file  Occupational History   Not on file  Tobacco Use   Smoking status: Never   Smokeless tobacco: Never   Tobacco comments:    Never smoke 07/15/21  Vaping Use   Vaping Use: Never used  Substance and Sexual Activity   Alcohol use: Never   Drug use: Never   Sexual activity: Not Currently  Other Topics Concern   Not on file  Social History Narrative   Not on file   Social Determinants of Health   Financial Resource Strain: Not on file  Food Insecurity: Not on file  Transportation Needs: Not on file  Physical Activity: Not on file  Stress: Not on file  Social Connections: Not on file     Family History: The patient's family history includes CVA in his father; Coronary artery disease in his mother.  ROS:   Please see the history of present illness.    + chronic stable shortness of breath All other systems reviewed and are negative.  Labs/Other Studies Reviewed:    The following studies were reviewed today:  Echo 04/05/22 1. Left ventricular ejection fraction, by estimation, is 55 to 60%. The  left ventricle has normal function. The left ventricle has no regional  wall motion abnormalities. Left ventricular diastolic parameters are  consistent with Grade II diastolic  dysfunction (pseudonormalization). The average left ventricular global  longitudinal strain is -23.7 %. The global longitudinal strain is normal.   2. Right ventricular systolic function is normal. The right ventricular  size is moderately  enlarged. There is moderately elevated pulmonary artery  systolic pressure. The estimated right ventricular systolic pressure is  123456 mmHg.   3. Left atrial size was severely dilated.   4. Right atrial size was severely dilated.   5. The mitral valve is normal in structure. Mild mitral valve  regurgitation. No evidence of mitral stenosis.   6. Tricuspid valve regurgitation is moderate to severe.  7. The aortic valve is tricuspid. There is mild calcification of the  aortic valve. There is moderate thickening of the aortic valve. Aortic  valve regurgitation is not visualized. Moderate aortic valve stenosis.   8. The inferior vena cava is dilated in size with >50% respiratory  variability, suggesting right atrial pressure of 8 mmHg.  Carotid Duplex 04/01/2021  Right Carotid: Velocities in the right ICA are consistent with a 1-39%  stenosis.   Left Carotid: Velocities in the left ICA are consistent with a 1-39%  stenosis.   Vertebrals: Bilateral vertebral arteries demonstrate antegrade flow.  Subclavians: Right subclavian artery was stenotic. Right subclavian artery  flow              was disturbed. Normal flow hemodynamics were seen in the left               subclavian artery.   *See table(s) above for measurements and observations.    Echo 06/18/19 1. Left ventricular ejection fraction, by estimation, is 60 to 65%. The  left ventricle has normal function. The left ventricle has no regional  wall motion abnormalities. The left ventricular internal cavity size was  mildly dilated. There is moderate  concentric left ventricular hypertrophy. Left ventricular diastolic  parameters are indeterminate.   2. Right ventricular systolic function is normal. The right ventricular  size is severely enlarged. There is mildly elevated pulmonary artery  systolic pressure. The estimated right ventricular systolic pressure is  0000000 mmHg.   3. Left atrial size was moderately dilated.   4. Right  atrial size was moderately dilated.   5. The mitral valve is normal in structure and function. Trivial mitral  valve regurgitation. No evidence of mitral stenosis.   6. Tricuspid valve regurgitation is mild to moderate.   7. The aortic valve is tricuspid. Aortic valve regurgitation is not  visualized. Mild aortic valve sclerosis is present, with no evidence of  aortic valve stenosis.   8. The inferior vena cava is dilated in size with <50% respiratory  variability, suggesting right atrial pressure of 15 mmHg.   Atrial Flutter Ablation 02/26/2018  CONCLUSIONS:   1. Isthmus-dependent right atrial flutter upon presentation.   2. Successful radiofrequency ablation of atrial flutter along the cavotricuspid isthmus with complete bidirectional isthmus block achieved.   3. No inducible arrhythmias following ablation.   4. No early apparent complications.    Atrial Fibrillation Ablation 12/13/2017 CONCLUSIONS: 1. Atrial fibrillation upon presentation.   2. Successful electrical isolation and anatomical encircling of all four pulmonary veins with radiofrequency current.  A WACA approach was used 3. Additional left atrial ablation was performed across the roof of the left atrium 4. Atrial fibrillation successfully cardioverted to sinus rhythm. 5. No early apparent complications.  V Tach Ablation 03/30/2015 CONCLUSIONS: 1. Sinus rhythm upon presentation   2. PVCs from the LVOT inferior the aortic valve 3. No early apparent complications.  Recent Labs: 11/24/2021: ALT 32 02/28/2022: TSH 0.021 04/27/2022: BUN 33; Creatinine, Ser 2.32; Potassium 4.6; Sodium 140  Recent Lipid Panel    Component Value Date/Time   CHOL 119 04/05/2021 0925   TRIG 82 04/05/2021 0925   HDL 48 04/05/2021 0925   CHOLHDL 2.5 04/05/2021 0925   LDLCALC 55 04/05/2021 0925     Risk Assessment/Calculations:    CHA2DS2-VASc Score = 4   This indicates a 4.8% annual risk of stroke. The patient's score is based  upon: CHF History: 1 HTN History: 1 Diabetes History: 0 Stroke History: 0  Vascular Disease History: 0 Age Score: 2 Gender Score: 0    Physical Exam:    VS:  BP 112/60   Pulse 94   Ht 5' 9"$  (1.753 m)   Wt 272 lb (123.4 kg)   SpO2 (!) 61%   BMI 40.17 kg/m     Wt Readings from Last 3 Encounters:  06/14/22 272 lb (123.4 kg)  04/27/22 275 lb 3.2 oz (124.8 kg)  04/04/22 271 lb (122.9 kg)     GEN: Obese, well developed in no acute distress HEENT: Normal NECK: No JVD; No carotid bruits CARDIAC: Distant, irregular RR, 2/6 systolic murmur. No rubs, gallops RESPIRATORY:  Clear to auscultation without rales, wheezing or rhonchi  ABDOMEN: Soft, non-tender, protuberant MUSCULOSKELETAL:  No edema; No deformity. 2+ pedal pulses, equal bilaterally SKIN: Warm and dry NEUROLOGIC:  Alert and oriented x 3 PSYCHIATRIC:  Normal affect   EKG:  EKG is not ordered today.    Diagnoses:    1. Aortic valve stenosis, etiology of cardiac valve disease unspecified   2. Pure hypercholesterolemia   3. Persistent atrial fibrillation ([place])   4. DOE (dyspnea on exertion)   5. Chronic kidney disease, stage IV (severe) ([place])   6. Secondary hypercoagulable state ([place])   7. PVC's (premature ventricular contractions)   8. Hyperlipidemia LDL goal <70   9. Chronic heart failure with preserved ejection fraction (HFpEF) ([place])   10. Essential hypertension   11. BMI 40.0-44.9, adult [facility])     Assessment and Plan:     Aortic stenosis: Seen by structural heart team 04/27/22 and diagnosed with paradoxical low-flow low gradient aortic stenosis (stage D3) with an aortic valve area around 1 cm with mean gradient 19 mmHg, V-max 52ms with stroke-volume index of 29, EF 55-60%. Plan initially to proceed with TAVR work-up, however due to severe CKD, nephrology advised against CTs. Will continue to follow clinically for now. He is overall feeling well with chronic, stable DOE. No change in activity tolerance. Advised  him to notify u[place]if symptoms worsen prior to next office visit. Has appointment for follow-up with Dr. TAli Loweand echo in May.   Dyspnea on exertion: Continues to have shortness of breath/DOE that he feels is stable. No chest pain. Feels that activity tolerance has not worsened recently. He denies orthopnea, PND. As noted above, continue to monitor valve disease. Continue weight loss efforts, low sodium diet.   Chronic HFpEF: Normal LVEF, G2 DD on echo 04/05/2022. Chronic DOE that he feels is stable. Mild bilateral LE edema that he feels has improved recently. No orthopnea, PND. No obvious volume overload on exam although body habitus makes it difficult to assess. He is overall feeling well. Continue torsemide, spironolactone, losartan.   Persistent atrial fibrillation on chronic anticoagulation: Clinically appears to be maintaining sinus rhythm today.  No palpitations, tachycardia. HR is well controlled. S/p ablation 11/2017 and a flutter ablation 01/2018. No bleeding problems on Xarelto. Continue amiodarone, Xarelto.   PVC: Quiescent at this time. S/p ablation 2016. Will have him schedule EP follow-up for 4 months.   Obesity: Continue to work on diet and weight loss.   CKD Stage 4: Per record review creatinine 2.2 -2.7 since 2022. Will hold off on TAVR work up due to concern for worsening renal function with contrast imaging. Weight loss, healthy diet, good hydration. Managed by Dr. CMarval Regal nephrololgy.   Hypertension: BP is well-controlled. No medication changes today.   Hyperlipidemia LDL goal < 70: LDL 55 on 04/05/21. We will recheck  today.    Disposition: Keep your May appointment with Dr. Micah Flesher months with EP provider  Medication Adjustments/Labs and Tests Ordered: Current medicines are reviewed at length with the patient today.  Concerns regarding medicines are outlined above.  Orders Placed This Encounter  Procedures   Lipid panel   Hepatic function panel   No orders of  the defined types were placed in this encounter.   Patient Instructions  Medication Instructions:  Your physician recommends that you continue on your current medications as directed. Please refer to the Current Medication list given to you today. *If you need a refill on your cardiac medications before your next appointment, please call your pharmacy*   Lab Work: [place] LFT If you have labs (blood work) drawn today and your tests are completely normal, you will receive your results only by: [place] (if you have MyChart) OR A paper copy in the mail If you have any lab test that is abnormal or we need to change your treatment, we will call you to review the results.   Testing/Procedures: NONE ORDERED   Follow-Up: At [facility], you and your health needs are our priority.  As part of our continuing mission to provide you with exceptional heart care, we have created designated Provider Care Teams.  These Care Teams include your primary Cardiologist (physician) and Advanced Practice Providers (APPs -  Physician Assistants and Nurse Practitioners) who all work together to provide you with the care you need, when you need it.  We recommend signing up for the patient portal called "MyChart".  Sign up information is provided on this After Visit Summary.  MyChart is used to connect with patients for Virtual Visits (Telemedicine).  Patients are able to view lab/test results, encounter notes, upcoming appointments, etc.  Non-urgent messages can be sent to your provider as well.   To learn more about what you can do with MyChart, go to [email or web address].    Your next appointment:   4-5 month(s)  Provider:   You may see [name], MD or one of the following Advanced Practice Providers on your designated Care Team:   Tommye Standard, [place] [address] [place], [place] Mamie Levers, NP    Other Instructions     Signed, Emmaline Life, NP   06/14/2022 1:34 PM    [place]

## 2022-06-14 ENCOUNTER — Ambulatory Visit: Payer: Medicare PPO | Attending: Nurse Practitioner | Admitting: Nurse Practitioner

## 2022-06-14 ENCOUNTER — Encounter: Payer: Self-pay | Admitting: Nurse Practitioner

## 2022-06-14 VITALS — BP 112/60 | HR 94 | Ht 69.0 in | Wt 272.0 lb

## 2022-06-14 DIAGNOSIS — I493 Ventricular premature depolarization: Secondary | ICD-10-CM

## 2022-06-14 DIAGNOSIS — E785 Hyperlipidemia, unspecified: Secondary | ICD-10-CM

## 2022-06-14 DIAGNOSIS — Z6841 Body Mass Index (BMI) 40.0 and over, adult: Secondary | ICD-10-CM

## 2022-06-14 DIAGNOSIS — N184 Chronic kidney disease, stage 4 (severe): Secondary | ICD-10-CM

## 2022-06-14 DIAGNOSIS — J45909 Unspecified asthma, uncomplicated: Secondary | ICD-10-CM | POA: Diagnosis not present

## 2022-06-14 DIAGNOSIS — R0609 Other forms of dyspnea: Secondary | ICD-10-CM

## 2022-06-14 DIAGNOSIS — E7800 Pure hypercholesterolemia, unspecified: Secondary | ICD-10-CM

## 2022-06-14 DIAGNOSIS — G4734 Idiopathic sleep related nonobstructive alveolar hypoventilation: Secondary | ICD-10-CM | POA: Diagnosis not present

## 2022-06-14 DIAGNOSIS — I350 Nonrheumatic aortic (valve) stenosis: Secondary | ICD-10-CM | POA: Diagnosis not present

## 2022-06-14 DIAGNOSIS — I4819 Other persistent atrial fibrillation: Secondary | ICD-10-CM

## 2022-06-14 DIAGNOSIS — I5032 Chronic diastolic (congestive) heart failure: Secondary | ICD-10-CM

## 2022-06-14 DIAGNOSIS — D6869 Other thrombophilia: Secondary | ICD-10-CM

## 2022-06-14 DIAGNOSIS — I10 Essential (primary) hypertension: Secondary | ICD-10-CM

## 2022-06-14 DIAGNOSIS — G4733 Obstructive sleep apnea (adult) (pediatric): Secondary | ICD-10-CM | POA: Diagnosis not present

## 2022-06-14 NOTE — Patient Instructions (Signed)
Medication Instructions:  Your physician recommends that you continue on your current medications as directed. Please refer to the Current Medication list given to you today. *If you need a refill on your cardiac medications before your next appointment, please call your pharmacy*   Lab Work: [place] LFT If you have labs (blood work) drawn today and your tests are completely normal, you will receive your results only by: [place] (if you have MyChart) OR A paper copy in the mail If you have any lab test that is abnormal or we need to change your treatment, we will call you to review the results.   Testing/Procedures: NONE ORDERED   Follow-Up: At [facility], you and your health needs are our priority.  As part of our continuing mission to provide you with exceptional heart care, we have created designated Provider Care Teams.  These Care Teams include your primary Cardiologist (physician) and Advanced Practice Providers (APPs -  Physician Assistants and Nurse Practitioners) who all work together to provide you with the care you need, when you need it.  We recommend signing up for the patient portal called "MyChart".  Sign up information is provided on this After Visit Summary.  MyChart is used to connect with patients for Virtual Visits (Telemedicine).  Patients are able to view lab/test results, encounter notes, upcoming appointments, etc.  Non-urgent messages can be sent to your provider as well.   To learn more about what you can do with MyChart, go to [email or web address].    Your next appointment:   4-5 month(s)  Provider:   You may see [name], MD or one of the following Advanced Practice Providers on your designated Care Team:   Tommye Standard, [place] Legrand [place] "Jonni Sanger" [place], [place] Mamie Levers, NP    Other Instructions

## 2022-06-15 LAB — HEPATIC FUNCTION PANEL
ALT: 19 IU/L (ref 0–44)
AST: 18 IU/L (ref 0–40)
Albumin: 4.2 g/dL (ref 3.8–4.8)
Alkaline Phosphatase: 90 IU/L (ref 44–121)
Bilirubin Total: 0.4 mg/dL (ref 0.0–1.2)
Bilirubin, Direct: 0.16 mg/dL (ref 0.00–0.40)
Total Protein: 7.5 g/dL (ref 6.0–8.5)

## 2022-06-15 LAB — LIPID PANEL
Chol/HDL Ratio: 2.3 ratio (ref 0.0–5.0)
Cholesterol, Total: 110 mg/dL (ref 100–199)
HDL: 47 mg/dL (ref >39)
LDL Chol Calc (NIH): 42 mg/dL (ref 0–99)
Triglycerides: 114 mg/dL (ref 0–149)
VLDL Cholesterol Cal: 21 mg/dL (ref 5–40)

## 2022-06-27 DIAGNOSIS — Z0289 Encounter for other administrative examinations: Secondary | ICD-10-CM

## 2022-06-29 DIAGNOSIS — G4733 Obstructive sleep apnea (adult) (pediatric): Secondary | ICD-10-CM | POA: Diagnosis not present

## 2022-06-29 DIAGNOSIS — G4734 Idiopathic sleep related nonobstructive alveolar hypoventilation: Secondary | ICD-10-CM | POA: Diagnosis not present

## 2022-06-29 DIAGNOSIS — J45909 Unspecified asthma, uncomplicated: Secondary | ICD-10-CM | POA: Diagnosis not present

## 2022-06-29 DIAGNOSIS — I10 Essential (primary) hypertension: Secondary | ICD-10-CM | POA: Diagnosis not present

## 2022-07-05 DIAGNOSIS — E1122 Type 2 diabetes mellitus with diabetic chronic kidney disease: Secondary | ICD-10-CM | POA: Diagnosis not present

## 2022-07-05 DIAGNOSIS — I10 Essential (primary) hypertension: Secondary | ICD-10-CM | POA: Diagnosis not present

## 2022-07-05 DIAGNOSIS — E785 Hyperlipidemia, unspecified: Secondary | ICD-10-CM | POA: Diagnosis not present

## 2022-07-05 DIAGNOSIS — I5032 Chronic diastolic (congestive) heart failure: Secondary | ICD-10-CM | POA: Diagnosis not present

## 2022-07-05 DIAGNOSIS — I129 Hypertensive chronic kidney disease with stage 1 through stage 4 chronic kidney disease, or unspecified chronic kidney disease: Secondary | ICD-10-CM | POA: Diagnosis not present

## 2022-07-05 DIAGNOSIS — I480 Paroxysmal atrial fibrillation: Secondary | ICD-10-CM | POA: Diagnosis not present

## 2022-07-05 DIAGNOSIS — N184 Chronic kidney disease, stage 4 (severe): Secondary | ICD-10-CM | POA: Diagnosis not present

## 2022-07-06 ENCOUNTER — Encounter (INDEPENDENT_AMBULATORY_CARE_PROVIDER_SITE_OTHER): Payer: Self-pay | Admitting: Family Medicine

## 2022-07-06 ENCOUNTER — Ambulatory Visit (INDEPENDENT_AMBULATORY_CARE_PROVIDER_SITE_OTHER): Payer: Medicare PPO | Admitting: Family Medicine

## 2022-07-06 VITALS — BP 142/42 | HR 56 | Temp 97.5°F | Ht 69.0 in | Wt 265.0 lb

## 2022-07-06 DIAGNOSIS — I129 Hypertensive chronic kidney disease with stage 1 through stage 4 chronic kidney disease, or unspecified chronic kidney disease: Secondary | ICD-10-CM

## 2022-07-06 DIAGNOSIS — Z6839 Body mass index (BMI) 39.0-39.9, adult: Secondary | ICD-10-CM

## 2022-07-06 DIAGNOSIS — I4819 Other persistent atrial fibrillation: Secondary | ICD-10-CM

## 2022-07-06 DIAGNOSIS — R0602 Shortness of breath: Secondary | ICD-10-CM

## 2022-07-06 DIAGNOSIS — E1169 Type 2 diabetes mellitus with other specified complication: Secondary | ICD-10-CM | POA: Diagnosis not present

## 2022-07-06 DIAGNOSIS — Z794 Long term (current) use of insulin: Secondary | ICD-10-CM

## 2022-07-06 DIAGNOSIS — E669 Obesity, unspecified: Secondary | ICD-10-CM

## 2022-07-06 DIAGNOSIS — E785 Hyperlipidemia, unspecified: Secondary | ICD-10-CM

## 2022-07-06 DIAGNOSIS — R5383 Other fatigue: Secondary | ICD-10-CM | POA: Diagnosis not present

## 2022-07-06 DIAGNOSIS — N184 Chronic kidney disease, stage 4 (severe): Secondary | ICD-10-CM | POA: Diagnosis not present

## 2022-07-06 DIAGNOSIS — Z1331 Encounter for screening for depression: Secondary | ICD-10-CM | POA: Diagnosis not present

## 2022-07-06 DIAGNOSIS — E1159 Type 2 diabetes mellitus with other circulatory complications: Secondary | ICD-10-CM | POA: Diagnosis not present

## 2022-07-06 DIAGNOSIS — Z7985 Long-term (current) use of injectable non-insulin antidiabetic drugs: Secondary | ICD-10-CM

## 2022-07-06 DIAGNOSIS — E1122 Type 2 diabetes mellitus with diabetic chronic kidney disease: Secondary | ICD-10-CM

## 2022-07-06 DIAGNOSIS — E559 Vitamin D deficiency, unspecified: Secondary | ICD-10-CM

## 2022-07-06 DIAGNOSIS — G4733 Obstructive sleep apnea (adult) (pediatric): Secondary | ICD-10-CM | POA: Diagnosis not present

## 2022-07-06 NOTE — Progress Notes (Signed)
Patient attended [place] talk.  He goes out to eat at lunch to [employer], [employer], [employer] every day.  Eats breakfast and supper at home. Wake up and drinks 8oz of orange juice with medicine.  Breakfast is 2 slices of toast with cheese or yogurt parfait both with water.  Occasionally will drink a coke (has both regular and diet). Feels satisfied.  Mid morning may eat something prior to gym- nabs/ orange/ apple (not necessarily hungry, eats to ensure blood sugar is stable for exercise).  Lunch is after working out. [employer] salad with grilled chicken with ranch or [employer] broccoli cheddar soup with bread (feels full).  May have a cookie in the middle of the afternoon- little debbie oatmeal cookies.  Supper is a sandwich or vegetables (deli chicken 2 slices).  Eating after dinner may be another cookie or orange.

## 2022-07-07 LAB — VITAMIN D 25 HYDROXY (VIT D DEFICIENCY, FRACTURES): Vit D, 25-Hydroxy: 47.2 ng/mL (ref 30.0–100.0)

## 2022-07-07 LAB — COMPREHENSIVE METABOLIC PANEL
ALT: 18 IU/L (ref 0–44)
AST: 20 IU/L (ref 0–40)
Albumin/Globulin Ratio: 1.5 (ref 1.2–2.2)
Albumin: 4.6 g/dL (ref 3.8–4.8)
Alkaline Phosphatase: 102 IU/L (ref 44–121)
BUN/Creatinine Ratio: 12 (ref 10–24)
BUN: 32 mg/dL — ABNORMAL HIGH (ref 8–27)
Bilirubin Total: 0.6 mg/dL (ref 0.0–1.2)
CO2: 25 mmol/L (ref 20–29)
Calcium: 9.7 mg/dL (ref 8.6–10.2)
Chloride: 97 mmol/L (ref 96–106)
Creatinine, Ser: 2.66 mg/dL — ABNORMAL HIGH (ref 0.76–1.27)
Globulin, Total: 3 g/dL (ref 1.5–4.5)
Glucose: 120 mg/dL — ABNORMAL HIGH (ref 70–99)
Potassium: 4.7 mmol/L (ref 3.5–5.2)
Sodium: 139 mmol/L (ref 134–144)
Total Protein: 7.6 g/dL (ref 6.0–8.5)
eGFR: 24 mL/min/{1.73_m2} — ABNORMAL LOW (ref >59)

## 2022-07-07 LAB — T3: T3, Total: 139 ng/dL (ref 71–180)

## 2022-07-07 LAB — CBC WITH DIFFERENTIAL/PLATELET
Basophils Absolute: 0.1 10*3/uL (ref 0.0–0.2)
Basos: 1 %
EOS (ABSOLUTE): 0.1 10*3/uL (ref 0.0–0.4)
Eos: 2 %
Hematocrit: 37.1 % — ABNORMAL LOW (ref 37.5–51.0)
Hemoglobin: 12 g/dL — ABNORMAL LOW (ref 13.0–17.7)
Immature Grans (Abs): 0 10*3/uL (ref 0.0–0.1)
Immature Granulocytes: 0 %
Lymphocytes Absolute: 1.1 10*3/uL (ref 0.7–3.1)
Lymphs: 17 %
MCH: 28.6 pg (ref 26.6–33.0)
MCHC: 32.3 g/dL (ref 31.5–35.7)
MCV: 88 fL (ref 79–97)
Monocytes Absolute: 0.7 10*3/uL (ref 0.1–0.9)
Monocytes: 12 %
Neutrophils Absolute: 4.2 10*3/uL (ref 1.4–7.0)
Neutrophils: 68 %
Platelets: 300 10*3/uL (ref 150–450)
RBC: 4.2 x10E6/uL (ref 4.14–5.80)
RDW: 13.6 % (ref 11.6–15.4)
WBC: 6.2 10*3/uL (ref 3.4–10.8)

## 2022-07-07 LAB — C-PEPTIDE: C-Peptide: 5.2 ng/mL — ABNORMAL HIGH (ref 1.1–4.4)

## 2022-07-07 LAB — HEMOGLOBIN A1C
Est. average glucose Bld gHb Est-mCnc: 148 mg/dL
Hgb A1c MFr Bld: 6.8 % — ABNORMAL HIGH (ref 4.8–5.6)

## 2022-07-07 LAB — TSH: TSH: 8.28 u[IU]/mL — ABNORMAL HIGH (ref 0.450–4.500)

## 2022-07-07 LAB — T4, FREE: Free T4: 1.14 ng/dL (ref 0.82–1.77)

## 2022-07-13 DIAGNOSIS — I10 Essential (primary) hypertension: Secondary | ICD-10-CM | POA: Diagnosis not present

## 2022-07-13 DIAGNOSIS — J45909 Unspecified asthma, uncomplicated: Secondary | ICD-10-CM | POA: Diagnosis not present

## 2022-07-13 DIAGNOSIS — G4733 Obstructive sleep apnea (adult) (pediatric): Secondary | ICD-10-CM | POA: Diagnosis not present

## 2022-07-13 DIAGNOSIS — G4734 Idiopathic sleep related nonobstructive alveolar hypoventilation: Secondary | ICD-10-CM | POA: Diagnosis not present

## 2022-07-18 NOTE — Progress Notes (Signed)
Chief Complaint:   OBESITY Mark Singh (MR# [id number]) is a 77 y.o. male who presents for evaluation and treatment of obesity and related comorbidities. Current BMI is Body mass index is 39.13 kg/m. Narvel has been struggling with his weight for many years and has been unsuccessful in either losing weight, maintaining weight loss, or reaching his healthy weight goal.  Patient attended [facility] talk. He goes out to eat at lunch to [employer], [employer], [employer] every day. Eats breakfast and supper at home. Wake up and drinks 8oz of orange juice with medicine. Breakfast is 2 slices of toast with cheese or yogurt parfait both with water. Occasionally will drink a coke (has both regular and diet). Feels satisfied. Mid morning may eat something prior to gym- nabs/ orange/ apple (not necessarily hungry, eats to ensure blood sugar is stable for exercise). Lunch is after working out. [employer] salad with grilled chicken with ranch or [employer] broccoli cheddar soup with bread (feels full). May have a cookie in the middle of the afternoon- little debbie oatmeal cookies. Supper is a sandwich or vegetables (deli chicken 2 slices). Eating after dinner may be another cookie or orange.   Camerin is currently in the action stage of change and ready to dedicate time achieving and maintaining a healthier weight. Jare is interested in becoming our patient and working on intensive lifestyle modifications including (but not limited to) diet and exercise for weight loss.  Belton's habits were reviewed today and are as follows: his desired weight loss is 40 lbs, he has been heavy most of his life, he started gaining weight 10 years ago, his heaviest weight ever was 290 pounds, he has significant food cravings issues, he snacks frequently in the evenings, he is frequently drinking liquids with calories, he frequently makes poor food choices, he frequently eats larger portions than normal, and he  struggles with emotional eating.  Depression Screen Raad's Food and Mood (modified PHQ-9) score was 14.  Subjective:   1. Other fatigue Nicodemus admits to daytime somnolence and admits to waking up still tired. Patient has a history of symptoms of daytime fatigue and morning fatigue. Aroldo generally gets  4-7  hours of sleep per night, and states that he has nightime awakenings. Snoring is not present. Apneic episodes are not present. Epworth Sleepiness Score is 15.  EKG done 02/28/2022.   2. SOBOE (shortness of breath on exertion) Chrissie Noa notes increasing shortness of breath with exercising and seems to be worsening over time with weight gain. He notes getting out of breath sooner with activity than he used to. This has not gotten worse recently. Meritt denies shortness of breath at rest or orthopnea.  3. Stage 4 chronic kidney disease (HCC) Patient sees [facility].  Last appointment was January with Dr.Coladnato.   4. Type 2 diabetes mellitus with stage 4 chronic kidney disease, with long-term current use of insulin (HCC) Last A1c 6 months ago was 6.4.  Patient was diagnosed about 15 years ago.  Patient is on Ozempic 0.5 mg and Lantus 30 units daily.  5. Persistent atrial fibrillation Patient is on Xarelto and amiodarone.  Patient is in rhythm currently.  Patient sees A-fib clinic.  6. Hypertension associated with diabetes (HCC) Blood pressure diastolic pressure low, similar to blood pressure at cardiology.  Patient denies dizziness or lightheadedness.  7. Hyperlipidemia associated with type 2 diabetes mellitus (HCC) Patient is on atorvastatin.  Last LDL 42 on 06/24/2022.  No  side effects noted.  8. OSA (obstructive sleep apnea) Patient has a CPAP that he wears nightly and uses 2 L on CPAP.  9. Vitamin D deficiency Patient is positive for fatigue.  Patient is not on any vitamin D medication.  Deficiency likely given obesity.  Assessment/Plan:   1. Other  fatigue Jamarii does feel that his weight is causing his energy to be lower than it should be. Fatigue may be related to obesity, depression or many other causes. Labs will be ordered, and in the meanwhile, Alder will focus on self care including making healthy food choices, increasing physical activity and focusing on stress reduction.  Check IC and labs today.  - TSH - T4, free - T3  2. SOBOE (shortness of breath on exertion) Romeo does feel that he gets out of breath more easily that he used to when he exercises. Ashtian's shortness of breath appears to be obesity related and exercise induced. He has agreed to work on weight loss and gradually increase exercise to treat his exercise induced shortness of breath. Will continue to monitor closely.  3. Stage 4 chronic kidney disease (HCC) Check labs today.  - CBC with Differential/Platelet  4. Type 2 diabetes mellitus with stage 4 chronic kidney disease, with long-term current use of insulin (HCC) Check labs today.  - Comprehensive metabolic panel - Hemoglobin A1c - C-peptide  5. Persistent atrial fibrillation Follow-up with cardiology at next scheduled appointment.  6. Hypertension associated with diabetes [facility]) Follow-up with cardiology as previously scheduled.  7. Hyperlipidemia associated with type 2 diabetes mellitus (HCC) Check FLP in 3 months.  8. OSA (obstructive sleep apnea) Plan to follow-up on compliance at next appointment.  9. Vitamin D deficiency Check vitamin D level today.  - VITAMIN D 25 Hydroxy (Vit-D Deficiency, Fractures)  10. BMI 39.0-39.9,adult  11. Depression screening Kiril had a positive depression screening. Depression is commonly associated with obesity and often results in emotional eating behaviors. We will monitor this closely and work on CBT to help improve the non-hunger eating patterns. Referral to Psychology may be required if no improvement is seen as he continues in our  clinic.'  12. Obesity with starting BMI of 39.2 Essa is currently in the action stage of change and his goal is to continue with weight loss efforts. I recommend Colben begin the structured treatment plan as follows:  He has agreed to the Category 3 Plan.  Exercise goals: As is.   Behavioral modification strategies: increasing lean protein intake, meal planning and cooking strategies, keeping healthy foods in the home, and planning for success.  He was informed of the importance of frequent follow-up visits to maximize his success with intensive lifestyle modifications for his multiple health conditions. He was informed we would discuss his lab results at his next visit unless there is a critical issue that needs to be addressed sooner. Cannen agreed to keep his next visit at the agreed upon time to discuss these results.  Objective:   Blood pressure (!) 142/42, pulse (!) 56, temperature (!) 97.5 F (36.4 C), height 5\' 9"  (1.753 m), weight 265 lb (120.2 kg), SpO2 97 %. Body mass index is 39.13 kg/m.  EKG: Normal sinus rhythm.  Indirect Calorimeter completed today shows a VO2 of 324 and a REE of 2232.  His calculated basal metabolic rate is 4742 thus his basal metabolic rate is better than expected.  General: Cooperative, alert, well developed, in no acute distress. HEENT: Conjunctivae and lids unremarkable. Cardiovascular: Regular rhythm.  Lungs: Normal work of breathing. Neurologic: No focal deficits.   Lab Results  Component Value Date   CREATININE 2.66 (H) 07/06/2022   BUN 32 (H) 07/06/2022   NA 139 07/06/2022   K 4.7 07/06/2022   CL 97 07/06/2022   CO2 25 07/06/2022   Lab Results  Component Value Date   ALT 18 07/06/2022   AST 20 07/06/2022   ALKPHOS 102 07/06/2022   BILITOT 0.6 07/06/2022   Lab Results  Component Value Date   HGBA1C 6.8 (H) 07/06/2022   No results found for: "INSULIN" Lab Results  Component Value Date   TSH 8.280 (H) 07/06/2022   Lab  Results  Component Value Date   CHOL 110 06/14/2022   HDL 47 06/14/2022   LDLCALC 42 06/14/2022   TRIG 114 06/14/2022   CHOLHDL 2.3 06/14/2022   Lab Results  Component Value Date   WBC 6.2 07/06/2022   HGB 12.0 (L) 07/06/2022   HCT 37.1 (L) 07/06/2022   MCV 88 07/06/2022   PLT 300 07/06/2022   No results found for: "IRON", "TIBC", "FERRITIN"  Attestation Statements:   Reviewed by clinician on day of visit: allergies, medications, problem list, medical history, surgical history, family history, social history, and previous encounter notes.  Time spent on visit including pre-visit chart review and post-visit charting and care was 60 minutes.   I, Malcolm Metro, RMA, am acting as transcriptionist for Reuben Likes, MD.  This is the patient's first visit at [facility]. The patient's NEW PATIENT PACKET was reviewed at length. Included in the packet: current and past health history, medications, allergies, ROS, gynecologic history (women only), surgical history, family history, social history, weight history, weight loss surgery history (for those that have had weight loss surgery), nutritional evaluation, mood and food questionnaire, PHQ9, Epworth questionnaire, sleep habits questionnaire, patient life and health improvement goals questionnaire. These will all be scanned into the patient's chart under media.   During the visit, I independently reviewed the patient's EKG, bioimpedance scale results, and indirect calorimeter results. I used this information to tailor a meal plan for the patient that will help him to lose weight and will improve his obesity-related conditions going forward. I performed a medically necessary appropriate examination and/or evaluation. I discussed the assessment and treatment plan with the patient. The patient was provided an opportunity to ask questions and all were answered. The patient agreed with the plan and demonstrated an understanding of  the instructions. Labs were ordered at this visit and will be reviewed at the next visit unless more critical results need to be addressed immediately. Clinical information was updated and documented in the EMR.   I have reviewed the above documentation for accuracy and completeness, and I agree with the above. - Reuben Likes, MD

## 2022-07-20 ENCOUNTER — Encounter (INDEPENDENT_AMBULATORY_CARE_PROVIDER_SITE_OTHER): Payer: Medicare PPO | Admitting: Family Medicine

## 2022-07-20 ENCOUNTER — Encounter (INDEPENDENT_AMBULATORY_CARE_PROVIDER_SITE_OTHER): Payer: Self-pay | Admitting: Family Medicine

## 2022-07-20 DIAGNOSIS — E1122 Type 2 diabetes mellitus with diabetic chronic kidney disease: Secondary | ICD-10-CM | POA: Diagnosis not present

## 2022-07-20 DIAGNOSIS — D6869 Other thrombophilia: Secondary | ICD-10-CM | POA: Diagnosis not present

## 2022-07-20 DIAGNOSIS — I480 Paroxysmal atrial fibrillation: Secondary | ICD-10-CM | POA: Diagnosis not present

## 2022-07-20 DIAGNOSIS — N184 Chronic kidney disease, stage 4 (severe): Secondary | ICD-10-CM | POA: Diagnosis not present

## 2022-07-20 DIAGNOSIS — M109 Gout, unspecified: Secondary | ICD-10-CM | POA: Diagnosis not present

## 2022-07-20 DIAGNOSIS — Z6839 Body mass index (BMI) 39.0-39.9, adult: Secondary | ICD-10-CM | POA: Diagnosis not present

## 2022-07-20 DIAGNOSIS — Z794 Long term (current) use of insulin: Secondary | ICD-10-CM | POA: Diagnosis not present

## 2022-07-21 ENCOUNTER — Ambulatory Visit (INDEPENDENT_AMBULATORY_CARE_PROVIDER_SITE_OTHER): Payer: Medicare PPO | Admitting: Family Medicine

## 2022-07-21 ENCOUNTER — Encounter (INDEPENDENT_AMBULATORY_CARE_PROVIDER_SITE_OTHER): Payer: Self-pay | Admitting: Family Medicine

## 2022-07-21 VITALS — BP 126/56 | HR 54 | Temp 98.1°F | Ht 69.0 in | Wt 261.4 lb

## 2022-07-21 DIAGNOSIS — E669 Obesity, unspecified: Secondary | ICD-10-CM

## 2022-07-21 DIAGNOSIS — N184 Chronic kidney disease, stage 4 (severe): Secondary | ICD-10-CM | POA: Diagnosis not present

## 2022-07-21 DIAGNOSIS — Z794 Long term (current) use of insulin: Secondary | ICD-10-CM

## 2022-07-21 DIAGNOSIS — R7989 Other specified abnormal findings of blood chemistry: Secondary | ICD-10-CM | POA: Insufficient documentation

## 2022-07-21 DIAGNOSIS — I152 Hypertension secondary to endocrine disorders: Secondary | ICD-10-CM

## 2022-07-21 DIAGNOSIS — E1159 Type 2 diabetes mellitus with other circulatory complications: Secondary | ICD-10-CM | POA: Diagnosis not present

## 2022-07-21 DIAGNOSIS — E1169 Type 2 diabetes mellitus with other specified complication: Secondary | ICD-10-CM | POA: Insufficient documentation

## 2022-07-21 DIAGNOSIS — E559 Vitamin D deficiency, unspecified: Secondary | ICD-10-CM | POA: Diagnosis not present

## 2022-07-21 DIAGNOSIS — Z7985 Long-term (current) use of injectable non-insulin antidiabetic drugs: Secondary | ICD-10-CM

## 2022-07-21 DIAGNOSIS — Z6839 Body mass index (BMI) 39.0-39.9, adult: Secondary | ICD-10-CM

## 2022-07-21 DIAGNOSIS — G4733 Obstructive sleep apnea (adult) (pediatric): Secondary | ICD-10-CM | POA: Insufficient documentation

## 2022-07-21 NOTE — Progress Notes (Signed)
Mark Singh, D.O.  ABFM, ABOM Specializing in Clinical Bariatric Medicine  Office located at: [address], [place]  [address]     Assessment and Plan:   No orders of the defined types were placed in this encounter.   There are no discontinued medications.   No orders of the defined types were placed in this encounter.    Diabetes mellitus type 2 in obese [facility]) Assessment: Condition is Controlled. Labs were reviewed.  Lab Results  Component Value Date   HGBA1C 6.8 (H) 07/06/2022  He states that his sugars are in the 100s-130s when he checks in the morning. Patient is compliant with Lantus 30 units daily and 0.5 Ozempic mg weekly. Denies any side effects with 0.5 Ozempic, and his it been taking that dose for approximately 2 weeks.[name] He does not notice any difference in hunger or cravings. He switched from Trulicity to [place] (per recommendation of his renal physician) by Dr.Miller 6 weeks ago.    Plan: Continue Lantus 30 units daily and 0.5 Ozempic mg weekly as recommended by specialist/PCP -We discussed the possibility of going up on the No[place]  in the future if his hunger or cravings increase - Continue to decrease simple carbs/ sugars; increase fiber and proteins -> follow his meal plan.   - Explained role of simple carbs and insulin levels on hunger and cravings - Jevion will continue to work on weight loss, exercise, via their meal plan we devised to help decrease the risk of progressing to diabetes.    CKD (chronic kidney disease) stage 4, GFR 15-29 ml/min (HCC) Assessment: Condition is stable Lab Results  Component Value Date   CREATININE 2.66 (H) 07/06/2022  His serum creatinine levels are stable. His CKD is managed by Dr.Cordonate at [facility]. Dr.Cordonato had recommended patient to go on Ozempic and PCP started him on it 6 weeks ago  Plan: Continue seeing Dr.Codonato for CKD management. Counseled patient to avoid OTC medications without  the approval of kidney doctor or PCP. I also recommend that if any other doctors want to place him on any  medications to check with his kidney doctor before doing so.    Abnormal Thyroid  Assessment: Condition is stable. Labs were reviewed.  Lab Results  Component Value Date   TSH 8.280 (H) 07/06/2022  Patients thyroid condition is managed by Dr.Miller. His thyroid levels are currently stable.  Plan: I deferred him to Dr.Miller for treatment of his abnormal thyroid as he has been treated by her for this condition for the past several months.     Hypertension associated with diabetes [facility]) Assessment: Condition is At goal. Labs were reviewed.  Last 3 blood pressure readings in our office are as follows: BP Readings from Last 3 Encounters:  07/21/22 (!) 126/56  07/20/22 (!) 112/56  07/06/22 (!) 142/42  His blood pressure is at goal for age. He reports good compliance and tolerance with Norvasc 10mg  daily, Pacerone 0.5 tablets daily, Apresoline 50mg  BID, Losartan 50mg  daily, Aldcatone 12.5 mg daily, and Demadez 20mg  daily.  Plan: Continue the following medications per cardiology for his heart and bp: Norvasc 10mg  daily, Pacerone 0.5 tablets daily, Apresoline 50mg  BID, Losartan 50mg  daily, Aldcatone 12.5 mg daily, and Demadez 20mg  daily.  - We will continue to monitor symptoms as they relate to the his weight loss journey.    Vitamin D deficiency Assessment: Condition is Controlled. Lab Results  Component Value Date   VD25OH 47.2 07/06/2022  He  is not taking any Vitamin D medication currently. Managed with diet.   Plan: Continue his prudent nutritional plan and continue to advance exercise and cardiovascular fitness as tolerated.  - weight loss will likely improve availability of vitamin D, thus encouraged Leodis to continue with meal plan and their weight loss efforts to further improve this condition.  Thus, we will need to monitor levels regularly (every 3-4 mo on average) to keep  levels within normal limits and prevent over supplementation.   TREATMENT PLAN FOR OBESITY: BMI 39.0-39.9,adult-current bmi 38.6 Obesity with starting BMI of 39.2/date 07/06/22 Assessment: Condition is Not optimized. Biometric data collected today, was reviewed with patient.  Fat mass has decreased by .2lb. Muscle mass has decreased by 0.8lb. Total body water has increased by 2.6lb.   Plan: Continue with Category 3 meal plan with breakfast and lunch options    Behavioral Intervention Additional resources provided today: category 3 meal plan information, slow cooker recipe handout, breakfast/ lunch options handout  Evidence-based interventions for health behavior change were utilized today including the discussion of self monitoring techniques, problem-solving barriers and SMART goal setting techniques.   Regarding patient's less desirable eating habits and patterns, we employed the technique of small changes.  Pt will specifically work on: measuring his proteins for next visit.    Recommended Physical Activity Goals Georgi has been advised to work up to 150 minutes of moderate intensity aerobic activity a week and strengthening exercises 2-3 times per week for cardiovascular health, weight loss maintenance and preservation of muscle mass.  He has agreed to Continue current level of physical activity    FOLLOW UP: No follow-ups on file. He was informed of the importance of frequent follow up visits to maximize his success with intensive lifestyle modifications for his multiple health conditions.  Subjective:   Chief complaint: Obesity Mark Singh is here to discuss his progress with his obesity treatment plan. He is on the the Category 3 Plan and states he is following his eating plan approximately 40 % of the time. He states he is walking 30 minutes 4 days per week.  Interval History:  RUEBEN Singh is here today for his first follow-up office visit since starting the program with  [place]. All blood work/ lab tests that were recently ordered by myself or an outside provider were reviewed with patient today per their request. Extended time was spent counseling him on all new disease processes that were discovered or preexisting ones that are affected by BMI.  he understands that many of these abnormalities will need to monitored regularly along with the current treatment plan of prudent dietary changes, in which we are making each and every office visit, to improve these health parameters.  Since last office visit he states that he has been following his meal plan approximately half the time. He has not yet started measuring his food portions. Breakfast is his biggest challenge because he says there are not many options he likes. He has been snacking on foods like cheese sticks.   It will be a challenge to follow his meal plan as he is going on a trip to [place] soon to see his sister.    Review of Systems:  Pertinent positives were addressed with patient today.  Weight Summary and Biometrics   Weight Lost Since Last Visit: 4lb  No data recorded  Vitals Temp: 98.1 F (36.7 C) BP: (!) 126/56 Pulse Rate: (!) 54 SpO2: 95 %   Anthropometric Measurements Height: 5\' 9"  (1.753  m) Weight: 261 lb 6.4 oz (118.6 kg) BMI (Calculated): 38.58 Weight at Last Visit: 265lb Weight Lost Since Last Visit: 4lb Starting Weight: 256lb Total Weight Loss (lbs): 4 lb (1.814 kg) Peak Weight: 290lb   Body Composition  Body Fat %: 37.5 % Fat Mass (lbs): 98 lbs Muscle Mass (lbs): 155.4 lbs Total Body Water (lbs): 126.8 lbs Visceral Fat Rating : 26   Other Clinical Data Fasting: no Labs: no Today's Visit #: 2 Starting Date: 07/06/22    Objective:   PHYSICAL EXAM:  Blood pressure (!) 126/56, pulse (!) 54, temperature 98.1 F (36.7 C), height 5\' 9"  (1.753 m), weight 261 lb 6.4 oz (118.6 kg), SpO2 95 %. Body mass index is 38.6 kg/m.  General: Well Developed, well  nourished, and in no acute distress.  HEENT: Normocephalic, atraumatic Skin: Warm and dry, cap RF less 2 sec, good turgor Chest:  Normal excursion, shape, no gross abn Respiratory: speaking in full sentences, no conversational dyspnea NeuroM-Sk: Ambulates w/o assistance, moves * 4 Psych: A and O *3, insight good, mood-full  DIAGNOSTIC DATA REVIEWED:  BMET    Component Value Date/Time   NA 139 07/06/2022 0919   K 4.7 07/06/2022 0919   CL 97 07/06/2022 0919   CO2 25 07/06/2022 0919   GLUCOSE 120 (H) 07/06/2022 0919   GLUCOSE 90 11/24/2021 1445   BUN 32 (H) 07/06/2022 0919   CREATININE 2.66 (H) 07/06/2022 0919   CREATININE 1.40 (H) 09/29/2015 1217   CALCIUM 9.7 07/06/2022 0919   GFRNONAA 20 (L) 11/24/2021 1445   GFRAA 33 (L) 05/21/2020 1134   Lab Results  Component Value Date   HGBA1C 6.8 (H) 07/06/2022   No results found for: "INSULIN" Lab Results  Component Value Date   TSH 8.280 (H) 07/06/2022   CBC    Component Value Date/Time   WBC 6.2 07/06/2022 0919   WBC 6.6 10/01/2018 1445   RBC 4.20 07/06/2022 0919   RBC 3.72 (L) 10/01/2018 1445   HGB 12.0 (L) 07/06/2022 0919   HCT 37.1 (L) 07/06/2022 0919   PLT 300 07/06/2022 0919   MCV 88 07/06/2022 0919   MCH 28.6 07/06/2022 0919   MCH 29.6 10/01/2018 1445   MCHC 32.3 07/06/2022 0919   MCHC 31.0 10/01/2018 1445   RDW 13.6 07/06/2022 0919   Iron Studies No results found for: "IRON", "TIBC", "FERRITIN", "IRONPCTSAT" Lipid Panel     Component Value Date/Time   CHOL 110 06/14/2022 1136   TRIG 114 06/14/2022 1136   HDL 47 06/14/2022 1136   CHOLHDL 2.3 06/14/2022 1136   LDLCALC 42 06/14/2022 1136   Hepatic Function Panel     Component Value Date/Time   PROT 7.6 07/06/2022 0919   ALBUMIN 4.6 07/06/2022 0919   AST 20 07/06/2022 0919   ALT 18 07/06/2022 0919   ALKPHOS 102 07/06/2022 0919   BILITOT 0.6 07/06/2022 0919   BILIDIR 0.16 06/14/2022 1136      Component Value Date/Time   TSH 8.280 (H) 07/06/2022  0919   Nutritional Lab Results  Component Value Date   VD25OH 47.2 07/06/2022    Attestations:   Reviewed by clinician on day of visit: allergies, medications, problem list, medical history, surgical history, family history, social history, and previous encounter notes.    I,Special Puri,acting as a scribe for [employer], DO.,have documented all relevant documentation on the behalf of Mellody Dance, DO,as directed by  Mellody Dance, DO while in the presence of Mellody Dance, DO.   I,  Mellody Dance, DO, have reviewed all documentation for this visit. The documentation on 07/21/22 for the exam, diagnosis, procedures, and orders are all accurate and complete.

## 2022-07-28 DIAGNOSIS — G4734 Idiopathic sleep related nonobstructive alveolar hypoventilation: Secondary | ICD-10-CM | POA: Diagnosis not present

## 2022-07-28 DIAGNOSIS — J45909 Unspecified asthma, uncomplicated: Secondary | ICD-10-CM | POA: Diagnosis not present

## 2022-07-28 DIAGNOSIS — G4733 Obstructive sleep apnea (adult) (pediatric): Secondary | ICD-10-CM | POA: Diagnosis not present

## 2022-07-28 DIAGNOSIS — I10 Essential (primary) hypertension: Secondary | ICD-10-CM | POA: Diagnosis not present

## 2022-08-13 DIAGNOSIS — I10 Essential (primary) hypertension: Secondary | ICD-10-CM | POA: Diagnosis not present

## 2022-08-13 DIAGNOSIS — J45909 Unspecified asthma, uncomplicated: Secondary | ICD-10-CM | POA: Diagnosis not present

## 2022-08-13 DIAGNOSIS — G4734 Idiopathic sleep related nonobstructive alveolar hypoventilation: Secondary | ICD-10-CM | POA: Diagnosis not present

## 2022-08-13 DIAGNOSIS — G4733 Obstructive sleep apnea (adult) (pediatric): Secondary | ICD-10-CM | POA: Diagnosis not present

## 2022-08-16 ENCOUNTER — Ambulatory Visit (INDEPENDENT_AMBULATORY_CARE_PROVIDER_SITE_OTHER): Payer: Medicare PPO | Admitting: Family Medicine

## 2022-08-16 ENCOUNTER — Encounter (INDEPENDENT_AMBULATORY_CARE_PROVIDER_SITE_OTHER): Payer: Self-pay | Admitting: Family Medicine

## 2022-08-16 VITALS — BP 111/51 | HR 59 | Temp 97.9°F | Ht 69.0 in | Wt 257.0 lb

## 2022-08-16 DIAGNOSIS — Z794 Long term (current) use of insulin: Secondary | ICD-10-CM | POA: Diagnosis not present

## 2022-08-16 DIAGNOSIS — E1165 Type 2 diabetes mellitus with hyperglycemia: Secondary | ICD-10-CM

## 2022-08-16 DIAGNOSIS — Z7985 Long-term (current) use of injectable non-insulin antidiabetic drugs: Secondary | ICD-10-CM | POA: Diagnosis not present

## 2022-08-16 DIAGNOSIS — E669 Obesity, unspecified: Secondary | ICD-10-CM

## 2022-08-16 DIAGNOSIS — Z6838 Body mass index (BMI) 38.0-38.9, adult: Secondary | ICD-10-CM | POA: Diagnosis not present

## 2022-08-16 NOTE — Progress Notes (Signed)
Chief Complaint:   OBESITY Mark Singh is here to discuss his progress with his obesity treatment plan along with follow-up of his obesity related diagnoses. Mark Singh is on the Category 3 Plan with breakfast and lunch options and states he is following his eating plan approximately 40% of the time. Mark Singh states he is gym for 90 minutes 4 times per week.  Today's visit was #: 3 Starting weight: 265 LBS Starting date: 07/06/2022 Today's weight: 257 LBS Today's date: 08/16/2022 Total lbs lost to date: 8 LBS Total lbs lost since last in-office visit: 4 LBS  Interim History: Saw Dr. Val Eagle at his first  follow up appointment.  He has been doing more exercise and mowing the grass.  Foodwise he tried to stick to the plan for a few days then fell off the wagon.  He voices that he has been trying to get back on plan in the last few days.  He mentions he may have wanted something different than what he had been having.  Wanted hamburgers, [place] food (chicken alfredo at [employer]), potatoes, cookies. Feels like even 7oz at supper is a lot of protein to get in.  He is going out of town for 2 days to do some work in [place].  Doesn't like milk and is trying to focus on getting the milk in.  Has been eating more yogurt recently.  Recognizes that he needs to make a bit more of a commitment to the plan.   Subjective:   1. Type 2 diabetes mellitus with hyperglycemia, without long-term current use of insulin Patient FBS around the same.  Patient unfortunately does not have a log.  Patient is taking Lantus 30 units and Ozempic 0.5 mg weekly.  Assessment/Plan:   1. Type 2 diabetes mellitus with hyperglycemia, without long-term current use of insulin Continue current medications, no change in medications or doses today.  Discussed again the importance of eating protein first.  2. BMI 38.0-38.9,adult  3. Obesity with starting BMI of 39.2 Mark Singh is currently in the action stage of change. As such, his  goal is to continue with weight loss efforts. He has agreed to the Category 3 Plan.   Exercise goals: All adults should avoid inactivity. Some physical activity is better than none, and adults who participate in any amount of physical activity gain some health benefits.  Behavioral modification strategies: increasing lean protein intake, meal planning and cooking strategies, keeping healthy foods in the home, and planning for success.  Mark Singh has agreed to follow-up with our clinic in 2 weeks. He was informed of the importance of frequent follow-up visits to maximize his success with intensive lifestyle modifications for his multiple health conditions.   Objective:   Blood pressure (!) 111/51, pulse (!) 59, temperature 97.9 F (36.6 C), height  (1.753 m), weight 257 lb (116.6 kg), SpO2 96 %. Body mass index is 37.95 kg/m.  General: Cooperative, alert, well developed, in no acute distress. HEENT: Conjunctivae and lids unremarkable. Cardiovascular: Regular rhythm.  Lungs: Normal work of breathing. Neurologic: No focal deficits.   Lab Results  Component Value Date   CREATININE 2.66 (H) 07/06/2022   BUN 32 (H) 07/06/2022   NA 139 07/06/2022   K 4.7 07/06/2022   CL 97 07/06/2022   CO2 25 07/06/2022   Lab Results  Component Value Date   ALT 18 07/06/2022   AST 20 07/06/2022   ALKPHOS 102 07/06/2022   BILITOT 0.6 07/06/2022   Lab Results  Component Value Date   HGBA1C 6.8 (H) 07/06/2022   No results found for: "INSULIN" Lab Results  Component Value Date   TSH 8.280 (H) 07/06/2022   Lab Results  Component Value Date   CHOL 110 06/14/2022   HDL 47 06/14/2022   LDLCALC 42 06/14/2022   TRIG 114 06/14/2022   CHOLHDL 2.3 06/14/2022   Lab Results  Component Value Date   VD25OH 47.2 07/06/2022   Lab Results  Component Value Date   WBC 6.2 07/06/2022   HGB 12.0 (L) 07/06/2022   HCT 37.1 (L) 07/06/2022   MCV 88 07/06/2022   PLT 300 07/06/2022   No results found  for: "IRON", "TIBC", "FERRITIN"  Attestation Statements:   Reviewed by clinician on day of visit: allergies, medications, problem list, medical history, surgical history, family history, social history, and previous encounter notes.  I, Malcolm Metro, RMA, am acting as transcriptionist for Reuben Likes, MD.  I have reviewed the above documentation for accuracy and completeness, and I agree with the above. - Reuben Likes, MD

## 2022-08-18 ENCOUNTER — Ambulatory Visit (INDEPENDENT_AMBULATORY_CARE_PROVIDER_SITE_OTHER): Payer: Medicare PPO | Admitting: Family Medicine

## 2022-08-21 DIAGNOSIS — J45909 Unspecified asthma, uncomplicated: Secondary | ICD-10-CM | POA: Diagnosis not present

## 2022-08-21 DIAGNOSIS — I10 Essential (primary) hypertension: Secondary | ICD-10-CM | POA: Diagnosis not present

## 2022-08-21 DIAGNOSIS — G4733 Obstructive sleep apnea (adult) (pediatric): Secondary | ICD-10-CM | POA: Diagnosis not present

## 2022-08-21 DIAGNOSIS — G4734 Idiopathic sleep related nonobstructive alveolar hypoventilation: Secondary | ICD-10-CM | POA: Diagnosis not present

## 2022-08-28 DIAGNOSIS — I10 Essential (primary) hypertension: Secondary | ICD-10-CM | POA: Diagnosis not present

## 2022-08-28 DIAGNOSIS — J45909 Unspecified asthma, uncomplicated: Secondary | ICD-10-CM | POA: Diagnosis not present

## 2022-08-28 DIAGNOSIS — G4733 Obstructive sleep apnea (adult) (pediatric): Secondary | ICD-10-CM | POA: Diagnosis not present

## 2022-08-28 DIAGNOSIS — G4734 Idiopathic sleep related nonobstructive alveolar hypoventilation: Secondary | ICD-10-CM | POA: Diagnosis not present

## 2022-08-30 DIAGNOSIS — N041 Nephrotic syndrome with focal and segmental glomerular lesions: Secondary | ICD-10-CM | POA: Diagnosis not present

## 2022-08-30 DIAGNOSIS — Z6841 Body Mass Index (BMI) 40.0 and over, adult: Secondary | ICD-10-CM | POA: Diagnosis not present

## 2022-08-30 DIAGNOSIS — I5030 Unspecified diastolic (congestive) heart failure: Secondary | ICD-10-CM | POA: Diagnosis not present

## 2022-08-30 DIAGNOSIS — E1122 Type 2 diabetes mellitus with diabetic chronic kidney disease: Secondary | ICD-10-CM | POA: Diagnosis not present

## 2022-08-30 DIAGNOSIS — I130 Hypertensive heart and chronic kidney disease with heart failure and stage 1 through stage 4 chronic kidney disease, or unspecified chronic kidney disease: Secondary | ICD-10-CM | POA: Diagnosis not present

## 2022-08-30 DIAGNOSIS — I4819 Other persistent atrial fibrillation: Secondary | ICD-10-CM | POA: Diagnosis not present

## 2022-08-30 DIAGNOSIS — N184 Chronic kidney disease, stage 4 (severe): Secondary | ICD-10-CM | POA: Diagnosis not present

## 2022-08-31 ENCOUNTER — Encounter (INDEPENDENT_AMBULATORY_CARE_PROVIDER_SITE_OTHER): Payer: Self-pay | Admitting: Family Medicine

## 2022-08-31 ENCOUNTER — Ambulatory Visit (INDEPENDENT_AMBULATORY_CARE_PROVIDER_SITE_OTHER): Payer: Medicare PPO | Admitting: Family Medicine

## 2022-08-31 VITALS — BP 94/50 | HR 62 | Temp 98.2°F | Ht 69.0 in | Wt 255.0 lb

## 2022-08-31 DIAGNOSIS — E1165 Type 2 diabetes mellitus with hyperglycemia: Secondary | ICD-10-CM

## 2022-08-31 DIAGNOSIS — E669 Obesity, unspecified: Secondary | ICD-10-CM | POA: Diagnosis not present

## 2022-08-31 DIAGNOSIS — I152 Hypertension secondary to endocrine disorders: Secondary | ICD-10-CM

## 2022-08-31 DIAGNOSIS — E1159 Type 2 diabetes mellitus with other circulatory complications: Secondary | ICD-10-CM | POA: Diagnosis not present

## 2022-08-31 DIAGNOSIS — Z6837 Body mass index (BMI) 37.0-37.9, adult: Secondary | ICD-10-CM | POA: Diagnosis not present

## 2022-08-31 DIAGNOSIS — Z794 Long term (current) use of insulin: Secondary | ICD-10-CM | POA: Diagnosis not present

## 2022-08-31 DIAGNOSIS — Z7985 Long-term (current) use of injectable non-insulin antidiabetic drugs: Secondary | ICD-10-CM | POA: Diagnosis not present

## 2022-08-31 NOTE — Progress Notes (Signed)
Chief Complaint:   OBESITY Mark Singh is here to discuss his progress with his obesity treatment plan along with follow-up of his obesity related diagnoses. Mark Singh is on the Category 3 Plan and states he is following his eating plan approximately 50% of the time. Mark Singh states he is going to [employer] and walking 3 miles 90-120 minutes 4 times per week.  Today's visit was #: 4 Starting weight: 265 lbs Starting date: 07/06/2022 Today's weight: 255 lbs Today's date: 08/31/2022 Total lbs lost to date: 10 lbs Total lbs lost since last in-office visit: 2 lbs  Interim History: Patient has been eating more on plan since last appointment.  He is exercising 5 days a week.  He has started a little yardwork as well.  He is trying to eat more protein and vegetables.  Lunch is more mindful and controlled and he is choosing salads from [employer] with chicken and water.  Breakfast is quite a bit of yogurt and protein cereal.    Subjective:   1. Type 2 diabetes mellitus with hyperglycemia, without long-term current use of insulin (HCC) FBS 97-132, PM BS 93-162.  Patient is on Ozempic and 30 units nightly.  2. Hypertension associated with type 2 diabetes mellitus (HCC) Patient blood pressure borderline low.  Patient denies dizziness, lightheadedness.  Patient sees cardiology and has aortic stenosis.  Assessment/Plan:   1. Type 2 diabetes mellitus with hyperglycemia, without long-term current use of insulin (HCC) Patient instructed to decrease Lantus to 25 units daily and monitor blood sugars.  2. Hypertension associated with type 2 diabetes mellitus (HCC) Patient to reach out to cardiology for management given blood pressure was low today.  3. BMI 37.0-37.9, adult  4. Obesity with starting BMI of 39.2 Mark Singh is currently in the action stage of change. As such, his goal is to continue with weight loss efforts. He has agreed to the Category 3 Plan.   Exercise goals: All adults should avoid  inactivity. Some physical activity is better than none, and adults who participate in any amount of physical activity gain some health benefits.  Behavioral modification strategies: increasing lean protein intake, decreasing eating out, meal planning and cooking strategies, keeping healthy foods in the home, and planning for success.  Nethanel has agreed to follow-up with our clinic in 2 weeks. He was informed of the importance of frequent follow-up visits to maximize his success with intensive lifestyle modifications for his multiple health conditions.   Objective:   Blood pressure (!) 94/50, pulse 62, temperature 98.2 F (36.8 C), height 5\' 9"  (1.753 m), weight 255 lb (115.7 kg), SpO2 98 %. Body mass index is 37.66 kg/m.  General: Cooperative, alert, well developed, in no acute distress. HEENT: Conjunctivae and lids unremarkable. Cardiovascular: Regular rhythm.  Lungs: Normal work of breathing. Neurologic: No focal deficits.   Lab Results  Component Value Date   CREATININE 2.66 (H) 07/06/2022   BUN 32 (H) 07/06/2022   NA 139 07/06/2022   K 4.7 07/06/2022   CL 97 07/06/2022   CO2 25 07/06/2022   Lab Results  Component Value Date   ALT 18 07/06/2022   AST 20 07/06/2022   ALKPHOS 102 07/06/2022   BILITOT 0.6 07/06/2022   Lab Results  Component Value Date   HGBA1C 6.8 (H) 07/06/2022   No results found for: "INSULIN" Lab Results  Component Value Date   TSH 8.280 (H) 07/06/2022   Lab Results  Component Value Date   CHOL 110 06/14/2022  HDL 47 06/14/2022   LDLCALC 42 06/14/2022   TRIG 114 06/14/2022   CHOLHDL 2.3 06/14/2022   Lab Results  Component Value Date   VD25OH 47.2 07/06/2022   Lab Results  Component Value Date   WBC 6.2 07/06/2022   HGB 12.0 (L) 07/06/2022   HCT 37.1 (L) 07/06/2022   MCV 88 07/06/2022   PLT 300 07/06/2022   No results found for: "IRON", "TIBC", "FERRITIN"  Attestation Statements:   Reviewed by clinician on day of visit:  allergies, medications, problem list, medical history, surgical history, family history, social history, and previous encounter notes.  I, Malcolm Metro, RMA, am acting as transcriptionist for Reuben Likes, MD.  I have reviewed the above documentation for accuracy and completeness, and I agree with the above. - Reuben Likes, MD

## 2022-09-06 ENCOUNTER — Other Ambulatory Visit: Payer: Self-pay | Admitting: Cardiology

## 2022-09-10 NOTE — Progress Notes (Signed)
error 

## 2022-09-14 ENCOUNTER — Encounter (INDEPENDENT_AMBULATORY_CARE_PROVIDER_SITE_OTHER): Payer: Self-pay | Admitting: Family Medicine

## 2022-09-14 ENCOUNTER — Ambulatory Visit (INDEPENDENT_AMBULATORY_CARE_PROVIDER_SITE_OTHER): Payer: Medicare PPO | Admitting: Family Medicine

## 2022-09-14 VITALS — BP 126/59 | HR 56 | Temp 98.0°F | Ht 69.0 in | Wt 252.0 lb

## 2022-09-14 DIAGNOSIS — E669 Obesity, unspecified: Secondary | ICD-10-CM | POA: Diagnosis not present

## 2022-09-14 DIAGNOSIS — E1159 Type 2 diabetes mellitus with other circulatory complications: Secondary | ICD-10-CM

## 2022-09-14 DIAGNOSIS — I152 Hypertension secondary to endocrine disorders: Secondary | ICD-10-CM

## 2022-09-14 DIAGNOSIS — Z6837 Body mass index (BMI) 37.0-37.9, adult: Secondary | ICD-10-CM

## 2022-09-14 DIAGNOSIS — E1165 Type 2 diabetes mellitus with hyperglycemia: Secondary | ICD-10-CM | POA: Diagnosis not present

## 2022-09-14 NOTE — Progress Notes (Signed)
Chief Complaint:   OBESITY Mark Singh is here to discuss his progress with his obesity treatment plan along with follow-up of his obesity related diagnoses. Mark Singh is on the Category 3 Plan and states he is following his eating plan approximately 50% of the time. Mark Singh states he is walking and lifting weights for 2 hours 5 times per week.    Today's visit was #: 5 Starting weight: 265 lbs Starting date: 07/06/2022 Today's weight: 252 lbs Today's date: 09/14/2022 Total lbs lost to date: 13 Total lbs lost since last in-office visit: 3  Interim History: Patient brought [employer] scan today- he has estimated fat mass of 10lbs higher on this scan than on bioimpedence scale.  RMR was estimated at 1875 by [employer] with IC of 2232 at first appointment. Felt he had slipped into some old habits food wise.  He has exercised more.  He did measure and weigh food more than he was and it was still hard to get everything in.  Breakfast was easier now that he is getting cereal in. Goal of next few weeks is eating at home more- wants to decrease eating out particularly at lunch.  No upcoming plans for [facility].   Subjective:   1. Type 2 diabetes mellitus with hyperglycemia, without long-term current use of insulin (HCC) Mark Singh's fasting blood sugars run high at 134, and low of 103 (mostly in 105-120's).  He has cut back to 25 units, and denies significant hyperglycemia.  2. Hypertension associated with diabetes (HCC) Mark Singh's blood pressure is well-controlled today.  He sees cardiology.  He denies chest pain, chest pressure, or headache.  Assessment/Plan:   1. Type 2 diabetes mellitus with hyperglycemia, without long-term current use of insulin (HCC) Mark Singh was encouraged that once his fasting blood sugars get to <110, he can decrease 5 units to 20 units daily.  2. Hypertension associated with diabetes Mark Singh) Mark Singh will continue his current medications.  He has an upcoming appointment with  cardiology.  3. BMI 37.0-37.9, adult  4. Obesity with starting BMI of 39.2 Mark Singh is currently in the action stage of change. As such, his goal is to continue with weight loss efforts. He has agreed to the Category 3 Plan.   Exercise goals: As is.   Behavioral modification strategies: increasing lean protein intake, meal planning and cooking strategies, keeping healthy foods in the home, and planning for success.  Mark Singh has agreed to follow-up with our clinic in 2 to 3 weeks. He was informed of the importance of frequent follow-up visits to maximize his success with intensive lifestyle modifications for his multiple health conditions.   Objective:   Blood pressure (!) 126/59, pulse (!) 56, temperature 98 F (36.7 C), height 5\' 9"  (1.753 m), weight 252 lb (114.3 kg), SpO2 96 %. Body mass index is 37.21 kg/m.  General: Cooperative, alert, well developed, in no acute distress. HEENT: Conjunctivae and lids unremarkable. Cardiovascular: Regular rhythm.  Lungs: Normal work of breathing. Neurologic: No focal deficits.   Lab Results  Component Value Date   CREATININE 2.66 (H) 07/06/2022   BUN 32 (H) 07/06/2022   NA 139 07/06/2022   K 4.7 07/06/2022   CL 97 07/06/2022   CO2 25 07/06/2022   Lab Results  Component Value Date   ALT 18 07/06/2022   AST 20 07/06/2022   ALKPHOS 102 07/06/2022   BILITOT 0.6 07/06/2022   Lab Results  Component Value Date   HGBA1C 6.8 (H) 07/06/2022   No results found  for: "INSULIN" Lab Results  Component Value Date   TSH 8.280 (H) 07/06/2022   Lab Results  Component Value Date   CHOL 110 06/14/2022   HDL 47 06/14/2022   LDLCALC 42 06/14/2022   TRIG 114 06/14/2022   CHOLHDL 2.3 06/14/2022   Lab Results  Component Value Date   VD25OH 47.2 07/06/2022   Lab Results  Component Value Date   WBC 6.2 07/06/2022   HGB 12.0 (L) 07/06/2022   HCT 37.1 (L) 07/06/2022   MCV 88 07/06/2022   PLT 300 07/06/2022   No results found for: "IRON",  "TIBC", "FERRITIN"  Attestation Statements:   Reviewed by clinician on day of visit: allergies, medications, problem list, medical history, surgical history, family history, social history, and previous encounter notes.   I, Burt Knack, am acting as transcriptionist for Reuben Likes, MD.  I have reviewed the above documentation for accuracy and completeness, and I agree with the above. - Reuben Likes, MD

## 2022-09-14 NOTE — Progress Notes (Signed)
Patient ID: CHA PRESS MRN: [id number] DOB/AGE: [birth year] 77 y.o.  Primary Care Physician:Miller, Misty Stanley, MD Primary Cardiologist: Donato Schultz, MD  FOCUSED CARDIOVASCULAR PROBLEM LIST:   1.  Paradoxical low-flow low gradient aortic stenosis (stage D3) with an aortic valve area of around 1 cm with mean gradient of 19 mmHg, V-max of 3 m/s with stroke-volume index of 29 was per meter squared with ejection fraction 55 to 60%; EKG with sinus bradycardia but no bundle-branch blocks 2.  Atrial fibrillation on anticoagulation 3.  PVC ablation 2016; atrial fibrillation ablation August 2019; atrial flutter ablation October 2019 4.  Hypertension 5.  Hyperlipidemia 6.  CKD stage IV; followed by nephrology (Dr. Arrie Aran) 7.  Type 2 diabetes on insulin  HISTORY OF PRESENT ILLNESS: January 2024: The patient is a 77 y.o. male with the indicated medical history here for recommendations regarding his aortic stenosis and tricuspid regurgitation.  He was seen by cardiology in early December due to worsening shortness of breath.  He usually is able to walk a mile however he has been able to do this over the last several weeks.  The patient has noticed increasing fatigue and shortness of breath with walking.  He has had no presyncope or syncope.  He is also noted some increasing peripheral edema.  He denies any orthopnea.  He has had no severe bleeding or bruising episodes while on Xarelto.  He is able to do most of his activities of daily living but he likes to exercise 3-4 times a week and has noticed increasing fatigue and shortness of breath with this activity that he enjoys.  He fortunately has not required any emergency room visits or hospitalizations for breathing issues or volume status.  He is retired for 8 years.  He was in [employer].  He does not smoke.  He sees a Education officer, community on a regular basis and reports good dental health.  Plan: Obtain TAVR protocol CTA and staged coronary  angiography if coronary arteries are not able to be assessed on CTA.  Today: In the interim the patient's nephrologist was contacted regarding the possibility of obtaining a TAVR protocol CTA.  Given his poor kidney function they thought that this would greatly increase the his risk of progression to requiring renal replacement therapy.  For this reason the CT scan was deferred.  The patient joined an exercise program and feels much better.  He is able to toe walk and do most of his activities of daily living without any issues.  He fortunately has not required any emergency room visits or hospitalizations.  He tells me he feels much better than when he saw me before.  Past Medical History:  Diagnosis Date   Arthritis    "hands, feet" (12/13/2017)   Asthma    Atrial fibrillation (HCC)    Bradycardia    a. Repoted h/o HR in the 40s.   Constipation    Edema    a. 2D echo 11/2013: EF 55-60%, LV diastolic function parameters were normal, severely dilated LA, PASP ..   H/O: gout    right foot; on daily RX" (12/13/2017)   Heart valve problem    HTN (hypertension)    Hyperlipidemia    Joint pain    Kidney disease, chronic, stage IV (GFR 15-29 ml/min) (HCC)    Myalgia and myositis, unspecified    Nephrotic syndrome    a. responder to steroid therapy by Dr. Eliott Nine.    Obesity    Orthostatic hypotension  Penile cancer (HCC)    "did circ"   Pulmonary hypertension (HCC)    a. Mild pulm HTN felt 2/2 obesity.   PVC's (premature ventricular contractions)    a. Holter 05/2013 showing 18k PVCs (18% of the time).   Seasonal asthma    mild   Shoulder pain    Sleep apnea    SOB (shortness of breath)    Squamous carcinoma    "right hand; left shoulder" (12/13/2017)   Type 2 diabetes mellitus (HCC)     Past Surgical History:  Procedure Laterality Date   A-FLUTTER ABLATION N/A 02/23/2018   Procedure: A-FLUTTER ABLATION;  Surgeon: Regan Lemming, MD;  Location: [facility];   Service: Cardiovascular;  Laterality: N/A;   ATRIAL FIBRILLATION ABLATION N/A 12/13/2017   Procedure: ATRIAL FIBRILLATION ABLATION;  Surgeon: Regan Lemming, MD;  Location: [facility];  Service: Cardiovascular;  Laterality: N/A;   BUNIONECTOMY WITH HAMMERTOE RECONSTRUCTION Left 1992  &  2008   REMOVAL HEAL SPUR/ BUNIONECTOMY AND HAMMERTOE REPAIR    CARDIOVERSION N/A 07/22/2015   Procedure: CARDIOVERSION;  Surgeon: Lars Masson, MD;  Location: [facility] ENDOSCOPY;  Service: Cardiovascular;  Laterality: N/A;   CARDIOVERSION N/A 10/06/2015   Procedure: CARDIOVERSION;  Surgeon: Quintella Reichert, MD;  Location: [facility] ENDOSCOPY;  Service: Cardiovascular;  Laterality: N/A;   CARDIOVERSION N/A 12/05/2016   Procedure: CARDIOVERSION;  Surgeon: Chrystie Nose, MD;  Location: [facility] ENDOSCOPY;  Service: Cardiovascular;  Laterality: N/A;   CARDIOVERSION N/A 06/29/2018   Procedure: CARDIOVERSION;  Surgeon: Vesta Mixer, MD;  Location: [facility] ENDOSCOPY;  Service: Cardiovascular;  Laterality: N/A;   CARDIOVERSION N/A 09/20/2018   Procedure: CARDIOVERSION;  Surgeon: Chilton Si, MD;  Location: [facility] ENDOSCOPY;  Service: Cardiovascular;  Laterality: N/A;   CARDIOVERSION N/A 03/24/2021   Procedure: CARDIOVERSION;  Surgeon: Meriam Sprague, MD;  Location: [facility] ENDOSCOPY;  Service: Cardiovascular;  Laterality: N/A;   CATARACT EXTRACTION     CIRCUMCISION N/A 11/27/2012   Procedure:  CIRCUMCISION  AND EXCISION OF GLANS PENIS;  Surgeon: Antony Haste, MD;  Location: [facility];  Service: Urology;  Laterality: N/A;   ELECTROPHYSIOLOGIC STUDY N/A 03/30/2015   Procedure: PVC Ablation;  Surgeon: Will Jorja Loa, MD;  Location: [facility];  Service: Cardiovascular;  Laterality: N/A;   HERNIA REPAIR     SQUAMOUS CELL CARCINOMA EXCISION     "right hand; left shoulder" (12/13/2017)   UMBILICAL HERNIA REPAIR  11/04/2005    Family History  Problem Relation Age of Onset    Coronary artery disease Mother    Stroke Father    Hypertension Father    Diabetes Father    CVA Father    Heart disease Father     Social History   Socioeconomic History   Marital status: Widowed    Spouse name: Not on file   Number of children: Not on file   Years of education: Not on file   Highest education level: Not on file  Occupational History   Occupation: Landscarper-Retired  Tobacco Use   Smoking status: Never   Smokeless tobacco: Never   Tobacco comments:    Never smoke 07/15/21  Vaping Use   Vaping Use: Never used  Substance and Sexual Activity   Alcohol use: Never   Drug use: Never   Sexual activity: Not Currently  Other Topics Concern   Not on file  Social History Narrative   Not on file   Social Determinants of  Health   Financial Resource Strain: Not on file  Food Insecurity: Not on file  Transportation Needs: Not on file  Physical Activity: Not on file  Stress: Not on file  Social Connections: Not on file  Intimate Partner Violence: Not on file     Prior to Admission medications   Medication Sig Start Date End Date Taking? Authorizing Provider  acetaminophen (TYLENOL) 500 MG tablet Take 1,000 mg by mouth every 6 (six) hours as needed for moderate pain.    [provider]  allopurinol (ZYLOPRIM) 100 MG tablet Take 50 mg by mouth every other day.    [provider]  amiodarone (PACERONE) 200 MG tablet Take 0.5 tablets (100 mg total) by mouth daily. 03/02/22   Camnitz, Will Daphine Deutscher, MD  amLODipine (NORVASC) 10 MG tablet TAKE 1 TABLET BY MOUTH EVERY DAY 08/24/20   Sheilah Pigeon, PA-C  atorvastatin (LIPITOR) 40 MG tablet Take 40 mg by mouth daily.    [provider]  BD PEN NEEDLE NANO 2ND GEN 32G X 4 MM MISC  12/23/19   [provider]  betamethasone valerate ointment (VALISONE) 0.1 % 1 application 2 (two) times daily as needed (precancerous spots). 11/25/19   [provider]  Cyanocobalamin (B-12 PO) Take 1  capsule by mouth daily.    [provider]  Dulaglutide (TRULICITY) 1.5 MG/0.5ML SOPN Inject 1.5 mg into the skin every Wednesday.    [provider]  hydrALAZINE (APRESOLINE) 50 MG tablet Take 1 tablet (50 mg total) by mouth 2 (two) times daily. Please contact the office to schedule appointment for additional refills. 03/15/22   Jake Bathe, MD  KLOR-CON M20 20 MEQ tablet TAKE 1 TABLET BY MOUTH EVERY DAY 05/18/21   Jake Bathe, MD  LANTUS SOLOSTAR 100 UNIT/ML Solostar Pen Inject 30 Units into the skin at bedtime. 02/08/18   [provider]  losartan (COZAAR) 50 MG tablet TAKE 1 TABLET BY MOUTH EVERY DAY 04/01/22   Jake Bathe, MD  omeprazole (PRILOSEC) 20 MG capsule Take 20 mg by mouth daily. 06/25/18   [provider]  OneTouch Delica Lancets 33G MISC USE AS DIRECTED EVERY DAY 12/16/18   [provider]  Letta Pate VERIO test strip USE AS DIRECTED (TEST DAILY) 90 **E11.9 01/20/19   [provider]  oxymetazoline (AFRIN) 0.05 % nasal spray Place 1 spray into both nostrils 2 (two) times daily as needed for congestion.    [provider]  pimecrolimus (ELIDEL) 1 % cream Apply 1 application topically 2 (two) times daily as needed (psoriasis).     [provider]  spironolactone (ALDACTONE) 25 MG tablet Take 0.5 tablets (12.5 mg total) by mouth daily. 04/18/22   Jake Bathe, MD  tamsulosin (FLOMAX) 0.4 MG CAPS capsule Take 0.4 mg by mouth every evening.  03/26/16   [provider]  torsemide (DEMADEX) 20 MG tablet TAKE 3 TABLETS BY MOUTH EVERY DAY 05/18/21   Jake Bathe, MD  VITAMIN D PO Take 1 capsule by mouth daily.    [provider]  Monte Fantasia INHUB 100-50 MCG/DOSE AEPB Inhale 1 puff into the lungs 2 (two) times daily. 09/28/18   [provider]  XARELTO 15 MG TABS tablet TAKE 1 TABLET (15 MG TOTAL) BY MOUTH DAILY WITH SUPPER 11/26/21   Jake Bathe, MD    Allergies  Allergen Reactions    Indomethacin Other (See Comments)    dizziness    REVIEW OF SYSTEMS:  General:  no fevers/chills/night sweats Eyes: no blurry vision, diplopia, or amaurosis ENT: no sore throat or hearing loss Resp: no cough, wheezing, or hemoptysis CV: no edema or palpitations GI: no abdominal pain, nausea, vomiting, diarrhea, or constipation GU: no dysuria, frequency, or hematuria Skin: no rash Neuro: no headache, numbness, tingling, or weakness of extremities Musculoskeletal: no joint pain or swelling Heme: no bleeding, DVT, or easy bruising Endo: no polydipsia or polyuria  BP (!) 128/59   Pulse (!) 57   Ht 5\' 9"  (1.753 m)   Wt 259 lb 12.8 oz (117.8 kg)   SpO2 95%   BMI 38.37 kg/m   PHYSICAL EXAM: GEN:  AO x 3 in no acute distress HEENT: normal Dentition: Normal Neck: JVP normal. +2 carotid upstrokes without bruits. No thyromegaly. Lungs: equal expansion, clear bilaterally CV: Apex is discrete and nondisplaced, RRR without murmurs gallops rubs or thrills  Abd: soft, non-tender, non-distended; no bruit; positive bowel sounds Ext: no edema, ecchymoses, or cyanosis Vascular: 2+ femoral pulses, 2+ radial pulses       Skin: warm and dry without rash Neuro: CN II-XII grossly intact; motor and sensory grossly intact    DATA AND STUDIES:  EKG: Sinus bradycardia without bundle blocks  2D ECHO:  May 2024: Formal read pending but aortic valve area looks to be around 1.38 with a mean gradient of 25 mmHg and a peak velocity of 3.29 m/s with a normal ejection fraction.  SVI ~32.9cc/kg/m2  2023 1. Left ventricular ejection fraction, by estimation, is 55 to 60%. The  left ventricle has normal function. The left ventricle has no regional  wall motion abnormalities. Left ventricular diastolic parameters are  consistent with Grade II diastolic  dysfunction (pseudonormalization). The average left ventricular global  longitudinal strain is -23.7 %. The global longitudinal strain is normal.   2.  Right ventricular systolic function is normal. The right ventricular  size is moderately enlarged. There is moderately elevated pulmonary artery  systolic pressure. The estimated right ventricular systolic pressure is  46.7 mmHg.   3. Left atrial size was severely dilated.   4. Right atrial size was severely dilated.   5. The mitral valve is normal in structure. Mild mitral valve  regurgitation. No evidence of mitral stenosis.   6. Tricuspid valve regurgitation is moderate to severe.   7. The aortic valve is tricuspid. There is mild calcification of the  aortic valve. There is moderate thickening of the aortic valve. Aortic  valve regurgitation is not visualized. Moderate aortic valve stenosis.   8. The inferior vena cava is dilated in size with >50% respiratory  variability, suggesting right atrial pressure of 8 mmHg.   CARDIAC CATH: n/a  STS RISK CALCULATOR: pending  NHYA CLASS: 2    ASSESSMENT AND PLAN:   Aortic valve stenosis, etiology of cardiac valve disease unspecified  Persistent atrial fibrillation (HCC)  Type 2 diabetes mellitus with complication, with long-term current use of insulin (HCC)  Hypertension associated with diabetes (HCC)  Hyperlipidemia associated with type 2 diabetes mellitus (HCC)   Fortunately the patient continues to do well.  He is much more active and he feels that some of his shortness of breath prior to this was probably due to deconditioning.  His echocardiogram on my read looks to be in the moderate range.  Currently and stroke-volume index is a little bit low but given his renal impairment I think we need to be pushed into an aortic valve intervention especially if he does not require dialysis  prior to any future aortic valve intervention.  For now we will have the patient follow-up in 9 months or earlier if needed.  Total time spent with patient today 30 minutes. This includes reviewing records, evaluating the patient and coordinating care.    Orbie Pyo, MD  09/19/2022 2:11 PM    [facility] [address] [place], [place], [place]  [address] Phone: [phone]; Fax: [phone]

## 2022-09-19 ENCOUNTER — Encounter: Payer: Self-pay | Admitting: Internal Medicine

## 2022-09-19 ENCOUNTER — Ambulatory Visit (INDEPENDENT_AMBULATORY_CARE_PROVIDER_SITE_OTHER): Payer: Medicare PPO | Admitting: Internal Medicine

## 2022-09-19 ENCOUNTER — Ambulatory Visit: Payer: Medicare PPO | Attending: Cardiovascular Disease

## 2022-09-19 VITALS — BP 128/59 | HR 57 | Ht 69.0 in | Wt 259.8 lb

## 2022-09-19 DIAGNOSIS — I152 Hypertension secondary to endocrine disorders: Secondary | ICD-10-CM | POA: Insufficient documentation

## 2022-09-19 DIAGNOSIS — I4819 Other persistent atrial fibrillation: Secondary | ICD-10-CM

## 2022-09-19 DIAGNOSIS — E1169 Type 2 diabetes mellitus with other specified complication: Secondary | ICD-10-CM | POA: Insufficient documentation

## 2022-09-19 DIAGNOSIS — I350 Nonrheumatic aortic (valve) stenosis: Secondary | ICD-10-CM | POA: Diagnosis not present

## 2022-09-19 DIAGNOSIS — E785 Hyperlipidemia, unspecified: Secondary | ICD-10-CM | POA: Diagnosis not present

## 2022-09-19 DIAGNOSIS — E118 Type 2 diabetes mellitus with unspecified complications: Secondary | ICD-10-CM | POA: Insufficient documentation

## 2022-09-19 DIAGNOSIS — I071 Rheumatic tricuspid insufficiency: Secondary | ICD-10-CM

## 2022-09-19 DIAGNOSIS — Z794 Long term (current) use of insulin: Secondary | ICD-10-CM

## 2022-09-19 DIAGNOSIS — I361 Nonrheumatic tricuspid (valve) insufficiency: Secondary | ICD-10-CM | POA: Diagnosis not present

## 2022-09-19 DIAGNOSIS — E1159 Type 2 diabetes mellitus with other circulatory complications: Secondary | ICD-10-CM | POA: Diagnosis not present

## 2022-09-19 DIAGNOSIS — Z7985 Long-term (current) use of injectable non-insulin antidiabetic drugs: Secondary | ICD-10-CM | POA: Diagnosis not present

## 2022-09-19 LAB — ECHOCARDIOGRAM COMPLETE
AR max vel: 1.35 cm2
AV Area VTI: 1.45 cm2
AV Area mean vel: 1.43 cm2
AV Mean grad: 28 mmHg
AV Peak grad: 48.7 mmHg
Ao pk vel: 3.49 m/s
Area-P 1/2: 2.63 cm2
S' Lateral: 4.2 cm

## 2022-09-19 NOTE — Patient Instructions (Signed)
Medication Instructions:  Your physician recommends that you continue on your current medications as directed. Please refer to the Current Medication list given to you today.  *If you need a refill on your cardiac medications before your next appointment, please call your pharmacy*   Follow-Up: At [facility], you and your health needs are our priority.  As part of our continuing mission to provide you with exceptional heart care, we have created designated Provider Care Teams.  These Care Teams include your primary Cardiologist (physician) and Advanced Practice Providers (APPs -  Physician Assistants and Nurse Practitioners) who all work together to provide you with the care you need, when you need it.    Your next appointment:   9 month(s)  Provider:   Dr Lynnette Caffey or structural APP

## 2022-09-27 DIAGNOSIS — I10 Essential (primary) hypertension: Secondary | ICD-10-CM | POA: Diagnosis not present

## 2022-09-27 DIAGNOSIS — G4734 Idiopathic sleep related nonobstructive alveolar hypoventilation: Secondary | ICD-10-CM | POA: Diagnosis not present

## 2022-09-27 DIAGNOSIS — J45909 Unspecified asthma, uncomplicated: Secondary | ICD-10-CM | POA: Diagnosis not present

## 2022-09-27 DIAGNOSIS — G4733 Obstructive sleep apnea (adult) (pediatric): Secondary | ICD-10-CM | POA: Diagnosis not present

## 2022-09-28 DIAGNOSIS — Z961 Presence of intraocular lens: Secondary | ICD-10-CM | POA: Diagnosis not present

## 2022-09-28 DIAGNOSIS — H26491 Other secondary cataract, right eye: Secondary | ICD-10-CM | POA: Diagnosis not present

## 2022-09-28 DIAGNOSIS — E119 Type 2 diabetes mellitus without complications: Secondary | ICD-10-CM | POA: Diagnosis not present

## 2022-10-10 ENCOUNTER — Encounter (INDEPENDENT_AMBULATORY_CARE_PROVIDER_SITE_OTHER): Payer: Self-pay | Admitting: Family Medicine

## 2022-10-10 ENCOUNTER — Ambulatory Visit (INDEPENDENT_AMBULATORY_CARE_PROVIDER_SITE_OTHER): Payer: Medicare PPO | Admitting: Family Medicine

## 2022-10-10 VITALS — BP 113/53 | HR 60 | Temp 98.2°F | Ht 69.0 in | Wt 249.0 lb

## 2022-10-10 DIAGNOSIS — E1165 Type 2 diabetes mellitus with hyperglycemia: Secondary | ICD-10-CM | POA: Diagnosis not present

## 2022-10-10 DIAGNOSIS — I152 Hypertension secondary to endocrine disorders: Secondary | ICD-10-CM | POA: Diagnosis not present

## 2022-10-10 DIAGNOSIS — E1159 Type 2 diabetes mellitus with other circulatory complications: Secondary | ICD-10-CM

## 2022-10-10 DIAGNOSIS — E669 Obesity, unspecified: Secondary | ICD-10-CM

## 2022-10-10 DIAGNOSIS — Z6836 Body mass index (BMI) 36.0-36.9, adult: Secondary | ICD-10-CM | POA: Diagnosis not present

## 2022-10-10 DIAGNOSIS — Z794 Long term (current) use of insulin: Secondary | ICD-10-CM | POA: Diagnosis not present

## 2022-10-10 MED ORDER — LANTUS SOLOSTAR 100 UNIT/ML ~~LOC~~ SOPN: 20.0000 [IU] | PEN_INJECTOR | Freq: Every day | SUBCUTANEOUS | 0 refills | Status: DC

## 2022-10-10 NOTE — Progress Notes (Signed)
Chief Complaint:   OBESITY Mark Singh is here to discuss his progress with his obesity treatment plan along with follow-up of his obesity related diagnoses. Mark Singh is on the Category 3 Plan and states he is following his eating plan approximately 40-50% of the time. Mark Singh states he is at the gym for 2 hours 4-5 times per week.    Today's visit was #: 6 Starting weight: 265 lbs Starting date: 07/06/2022 Today's weight: 249 lbs Today's date: 10/10/2022 Total lbs lost to date: 16 Total lbs lost since last in-office visit: 3  Interim History: Since last appointment patient has been doing mostly the same stuff as he was previously. He had an In Body done this am and his skeletal muscle mass increased by 1.8lb and his percent body fat decreased 2.2%.  He has been increasing his walking after his resistance training. For food he is doing mainly the protein cereal.  Lunch is mainly eating out and getting sandwiches.  Dinner is microwave meals that are around 300 calories. Sandwich at lunch can be [employer] or [employer].  No plans to go anywhere the next few weeks- no plans for July 4th.   Subjective:   1. Type 2 diabetes mellitus with hyperglycemia, without long-term current use of insulin (HCC) Patient is now taking Lantus 25 units daily; brought blood sugar log in today and morning fasting blood sugars range between 81-135, mostly in 110s.  He denies hypoglycemia.  2. Hypertension associated with diabetes (HCC) Patient's blood pressure is well-controlled today.  He denies chest pain, chest pressure, or headache.  Assessment/Plan:   1. Type 2 diabetes mellitus with hyperglycemia, without long-term current use of insulin (HCC) Patient agreed to decrease Lantus to 20 units daily; we will follow-up on his blood sugars at his next appointment.  - LANTUS SOLOSTAR 100 UNIT/ML Solostar Pen; Inject 20 Units into the skin at bedtime. Pt currently taking 25 units daily  Dispense: 15 mL; Refill:  0  2. Hypertension associated with diabetes [facility]) Patient will continue Norvasc, hydralazine, Cozaar, torsemide, and Aldactone.  3. BMI 36.0-36.9,adult  4. Obesity with starting BMI of 39.2 Mark Singh is currently in the action stage of change. As such, his goal is to continue with weight loss efforts. He has agreed to the Category 3 Plan.   Patient was encouraged to add a protein shake daily.  Exercise goals: All adults should avoid inactivity. Some physical activity is better than none, and adults who participate in any amount of physical activity gain some health benefits.  Behavioral modification strategies: increasing lean protein intake, meal planning and cooking strategies, keeping healthy foods in the home, and planning for success.  Mark Singh has agreed to follow-up with our clinic in 4 to 5 weeks. He was informed of the importance of frequent follow-up visits to maximize his success with intensive lifestyle modifications for his multiple health conditions.   Objective:   Blood pressure (!) 113/53, pulse 60, temperature 98.2 F (36.8 C), height 5\' 9"  (1.753 m), weight 249 lb (112.9 kg), SpO2 96 %. Body mass index is 36.77 kg/m.  General: Cooperative, alert, well developed, in no acute distress. HEENT: Conjunctivae and lids unremarkable. Cardiovascular: Regular rhythm.  Lungs: Normal work of breathing. Neurologic: No focal deficits.   Lab Results  Component Value Date   CREATININE 2.66 (H) 07/06/2022   BUN 32 (H) 07/06/2022   NA 139 07/06/2022   K 4.7 07/06/2022   CL 97 07/06/2022   CO2 25 07/06/2022  Lab Results  Component Value Date   ALT 18 07/06/2022   AST 20 07/06/2022   ALKPHOS 102 07/06/2022   BILITOT 0.6 07/06/2022   Lab Results  Component Value Date   HGBA1C 6.8 (H) 07/06/2022   No results found for: "INSULIN" Lab Results  Component Value Date   TSH 8.280 (H) 07/06/2022   Lab Results  Component Value Date   CHOL 110 06/14/2022   HDL 47  06/14/2022   LDLCALC 42 06/14/2022   TRIG 114 06/14/2022   CHOLHDL 2.3 06/14/2022   Lab Results  Component Value Date   VD25OH 47.2 07/06/2022   Lab Results  Component Value Date   WBC 6.2 07/06/2022   HGB 12.0 (L) 07/06/2022   HCT 37.1 (L) 07/06/2022   MCV 88 07/06/2022   PLT 300 07/06/2022   No results found for: "IRON", "TIBC", "FERRITIN"  Attestation Statements:   Reviewed by clinician on day of visit: allergies, medications, problem list, medical history, surgical history, family history, social history, and previous encounter notes.   I, Burt Knack, am acting as transcriptionist for Reuben Likes, MD.  I have reviewed the above documentation for accuracy and completeness, and I agree with the above. - Reuben Likes, MD

## 2022-10-12 ENCOUNTER — Ambulatory Visit: Payer: Medicare PPO | Attending: Cardiology | Admitting: Cardiology

## 2022-10-12 ENCOUNTER — Encounter: Payer: Self-pay | Admitting: Cardiology

## 2022-10-12 VITALS — BP 110/66 | HR 58 | Ht 69.0 in | Wt 257.4 lb

## 2022-10-12 DIAGNOSIS — I483 Typical atrial flutter: Secondary | ICD-10-CM

## 2022-10-12 DIAGNOSIS — Z79899 Other long term (current) drug therapy: Secondary | ICD-10-CM | POA: Diagnosis not present

## 2022-10-12 DIAGNOSIS — I5032 Chronic diastolic (congestive) heart failure: Secondary | ICD-10-CM | POA: Diagnosis not present

## 2022-10-12 DIAGNOSIS — I493 Ventricular premature depolarization: Secondary | ICD-10-CM

## 2022-10-12 DIAGNOSIS — I4819 Other persistent atrial fibrillation: Secondary | ICD-10-CM

## 2022-10-12 DIAGNOSIS — D6869 Other thrombophilia: Secondary | ICD-10-CM | POA: Diagnosis not present

## 2022-10-12 DIAGNOSIS — I10 Essential (primary) hypertension: Secondary | ICD-10-CM | POA: Diagnosis not present

## 2022-10-12 NOTE — Progress Notes (Signed)
Electrophysiology Office Note   Date:  10/12/2022   ID:  Mark Singh, DOB [birth year], MRN [id number]  PCP:  Sigmund Hazel, MD  Cardiologist:  Anne Fu Primary Electrophysiologist:  Coltrane Tugwell Jorja Loa, MD    No chief complaint on file.     History of Present Illness: Mark Singh is a 77 y.o. male who presents today for electrophysiology evaluation.     He has a history significant for PVCs, nephrotic syndrome, diabetes, hypertension, hyperlipidemia, morbid obesity, pulmonary hypertension.  He is post PVC ablation.  PVCs were inferior to the left coronary cusp.  A left ablation was performed 03/30/2015.  He went into atrial fibrillation and is post ablation 12/13/2017.  He then presented atrial flutter is post ablation 02/26/2018.  He is on amiodarone for atrial fibrillation  Today, denies symptoms of palpitations, chest pain, shortness of breath, orthopnea, PND, lower extremity edema, claudication, dizziness, presyncope, syncope, bleeding, or neurologic sequela. The patient is tolerating medications without difficulties.  Since being seen he has done well.  He has no chest pain or shortness of breath.  He is able to do all his daily activities.  He continues to go to the gym and exercise.  He has had a few episodes of dizziness when he stands up too fast or is working out at [employer]  Otherwise he has no acute complaints.   Past Medical History:  Diagnosis Date   Arthritis    "hands, feet" (12/13/2017)   Asthma    Atrial fibrillation (HCC)    Bradycardia    a. Repoted h/o HR in the 40s.   Constipation    Edema    a. 2D echo 11/2013: EF 55-60%, LV diastolic function parameters were normal, severely dilated LA, PASP ..   H/O: gout    right foot; on daily RX" (12/13/2017)   Heart valve problem    HTN (hypertension)    Hyperlipidemia    Joint pain    Kidney disease, chronic, stage IV (GFR 15-29 ml/min) (HCC)    Myalgia and myositis, unspecified    Nephrotic  syndrome    a. responder to steroid therapy by Dr. Eliott Nine.    Obesity    Orthostatic hypotension    Penile cancer (HCC)    "did circ"   Pulmonary hypertension (HCC)    a. Mild pulm HTN felt 2/2 obesity.   PVC's (premature ventricular contractions)    a. Holter 05/2013 showing 18k PVCs (18% of the time).   Seasonal asthma    mild   Shoulder pain    Sleep apnea    SOB (shortness of breath)    Squamous carcinoma    "right hand; left shoulder" (12/13/2017)   Type 2 diabetes mellitus (HCC)    Past Surgical History:  Procedure Laterality Date   A-FLUTTER ABLATION N/A 02/23/2018   Procedure: A-FLUTTER ABLATION;  Surgeon: Regan Lemming, MD;  Location: [facility];  Service: Cardiovascular;  Laterality: N/A;   ATRIAL FIBRILLATION ABLATION N/A 12/13/2017   Procedure: ATRIAL FIBRILLATION ABLATION;  Surgeon: Regan Lemming, MD;  Location: [facility];  Service: Cardiovascular;  Laterality: N/A;   BUNIONECTOMY WITH HAMMERTOE RECONSTRUCTION Left 1992  &  2008   REMOVAL HEAL SPUR/ BUNIONECTOMY AND HAMMERTOE REPAIR    CARDIOVERSION N/A 07/22/2015   Procedure: CARDIOVERSION;  Surgeon: Lars Masson, MD;  Location: [facility] ENDOSCOPY;  Service: Cardiovascular;  Laterality: N/A;   CARDIOVERSION N/A 10/06/2015   Procedure: CARDIOVERSION;  Surgeon: Quintella Reichert,  MD;  Location: [facility] ENDOSCOPY;  Service: Cardiovascular;  Laterality: N/A;   CARDIOVERSION N/A 12/05/2016   Procedure: CARDIOVERSION;  Surgeon: Chrystie Nose, MD;  Location: [facility] ENDOSCOPY;  Service: Cardiovascular;  Laterality: N/A;   CARDIOVERSION N/A 06/29/2018   Procedure: CARDIOVERSION;  Surgeon: Vesta Mixer, MD;  Location: [facility] ENDOSCOPY;  Service: Cardiovascular;  Laterality: N/A;   CARDIOVERSION N/A 09/20/2018   Procedure: CARDIOVERSION;  Surgeon: Chilton Si, MD;  Location: [facility] ENDOSCOPY;  Service: Cardiovascular;  Laterality: N/A;   CARDIOVERSION N/A 03/24/2021   Procedure: CARDIOVERSION;  Surgeon:  Meriam Sprague, MD;  Location: [facility] ENDOSCOPY;  Service: Cardiovascular;  Laterality: N/A;   CATARACT EXTRACTION     CIRCUMCISION N/A 11/27/2012   Procedure:  CIRCUMCISION  AND EXCISION OF GLANS PENIS;  Surgeon: Antony Haste, MD;  Location: [facility];  Service: Urology;  Laterality: N/A;   ELECTROPHYSIOLOGIC STUDY N/A 03/30/2015   Procedure: PVC Ablation;  Surgeon: Cristella Stiver Jorja Loa, MD;  Location: [facility];  Service: Cardiovascular;  Laterality: N/A;   HERNIA REPAIR     SQUAMOUS CELL CARCINOMA EXCISION     "right hand; left shoulder" (12/13/2017)   UMBILICAL HERNIA REPAIR  11/04/2005     Current Outpatient Medications  Medication Sig Dispense Refill   acetaminophen (TYLENOL) 500 MG tablet Take 1,000 mg by mouth every 6 (six) hours as needed for moderate pain.     allopurinol (ZYLOPRIM) 100 MG tablet Take 50 mg by mouth daily.     amiodarone (PACERONE) 200 MG tablet Take 0.5 tablets (100 mg total) by mouth daily. 45 tablet 3   amLODipine (NORVASC) 10 MG tablet TAKE 1 TABLET BY MOUTH EVERY DAY 90 tablet 1   atorvastatin (LIPITOR) 40 MG tablet Take 40 mg by mouth daily.     BD PEN NEEDLE NANO 2ND GEN 32G X 4 MM MISC      betamethasone valerate ointment (VALISONE) 0.1 % 1 application 2 (two) times daily as needed (precancerous spots).     hydrALAZINE (APRESOLINE) 50 MG tablet TAKE 1 TABLET BY MOUTH 2 TIMES DAILY. SCHEDULE APPT FOR MORE REFILLS. 180 tablet 1   KLOR-CON M20 20 MEQ tablet TAKE 1 TABLET BY MOUTH EVERY DAY 90 tablet 3   LANTUS SOLOSTAR 100 UNIT/ML Solostar Pen Inject 20 Units into the skin at bedtime. Pt currently taking 25 units daily 15 mL 0   losartan (COZAAR) 50 MG tablet TAKE 1 TABLET BY MOUTH EVERY DAY 90 tablet 3   omeprazole (PRILOSEC) 20 MG capsule Take 20 mg by mouth daily.     OneTouch Delica Lancets 33G MISC USE AS DIRECTED EVERY DAY     ONETOUCH VERIO test strip USE AS DIRECTED (TEST DAILY) 90 **E11.9     OZEMPIC, 0.25 OR  0.5 MG/DOSE, 2 MG/3ML SOPN Inject 0.5 mg into the skin once a week.     pimecrolimus (ELIDEL) 1 % cream Apply 1 application topically 2 (two) times daily as needed (psoriasis).      Polyethylene Glycol 3350 (MIRALAX PO) Take by mouth.     spironolactone (ALDACTONE) 25 MG tablet Take 0.5 tablets (12.5 mg total) by mouth daily. 45 tablet 3   tamsulosin (FLOMAX) 0.4 MG CAPS capsule Take 0.4 mg by mouth every evening.   4   torsemide (DEMADEX) 20 MG tablet TAKE 3 TABLETS BY MOUTH EVERY DAY 270 tablet 3   WIXELA INHUB 100-50 MCG/DOSE AEPB Inhale 1 puff into the lungs 2 (two) times daily.  XARELTO 15 MG TABS tablet TAKE 1 TABLET (15 MG TOTAL) BY MOUTH DAILY WITH SUPPER 90 tablet 1   No current facility-administered medications for this visit.    Allergies:   Indomethacin   Social History:  The patient  reports that he has never smoked. He has never used smokeless tobacco. He reports that he does not drink alcohol and does not use drugs.   Family History:  The patient's family history includes CVA in his father; Coronary artery disease in his mother; Diabetes in his father; Heart disease in his father; Hypertension in his father; Stroke in his father.   ROS:  Please see the history of present illness.   Otherwise, review of systems is positive for none.   All other systems are reviewed and negative.   PHYSICAL EXAM: VS:  BP 110/66   Pulse (!) 58   Ht 5\' 9"  (1.753 m)   Wt 257 lb 6.4 oz (116.8 kg)   SpO2 97%   BMI 38.01 kg/m  , BMI Body mass index is 38.01 kg/m. GEN: Well nourished, well developed, in no acute distress  HEENT: normal  Neck: no JVD, carotid bruits, or masses Cardiac: RRR; no murmurs, rubs, or gallops,no edema  Respiratory:  clear to auscultation bilaterally, normal work of breathing GI: soft, nontender, nondistended, + BS MS: no deformity or atrophy  Skin: warm and dry Neuro:  Strength and sensation are intact Psych: euthymic mood, full affect  EKG:  EKG is ordered  today. Personal review of the ekg ordered shows sinus rhythm, rate 58   Recent Labs: 07/06/2022: ALT 18; BUN 32; Creatinine, Ser 2.66; Hemoglobin 12.0; Platelets 300; Potassium 4.7; Sodium 139; TSH 8.280    Lipid Panel     Component Value Date/Time   CHOL 110 06/14/2022 1136   TRIG 114 06/14/2022 1136   HDL 47 06/14/2022 1136   CHOLHDL 2.3 06/14/2022 1136   LDLCALC 42 06/14/2022 1136     Wt Readings from Last 3 Encounters:  10/12/22 257 lb 6.4 oz (116.8 kg)  10/10/22 249 lb (112.9 kg)  09/19/22 259 lb 12.8 oz (117.8 kg)      Other studies Reviewed: Additional studies/ records that were reviewed today include: TTE 12/28/14  Review of the above records today demonstrates:  - Left ventricle: The cavity size was normal. There was mild   concentric hypertrophy. Systolic function was normal. The   estimated ejection fraction was in the range of 55% to 60%.   Although no diagnostic regional wall motion abnormality was   identified, this possibility cannot be completely excluded on the   basis of this study. Left ventricular diastolic function   parameters were normal. - Left atrium: The atrium was severely dilated. - Pulmonary arteries: Systolic pressure was mildly increased. PA   peak pressure: 38 mm Hg (S).   ASSESSMENT AND PLAN:  1.  PVCs: Post ablation 03/10/2015.  No obvious recurrence.  Continue with current management.  2.  Persistent atrial fibrillation: Status post ablation 12/23/2017.  Currently on Xarelto and amiodarone.  CHA2DS2-VASc of 3.  Remains in sinus rhythm.  3.  Typical atrial flutter: Post ablation 03/29/2018.  No recurrence  4.  Chronic diastolic heart failure: No obvious volume overload  5.  CKD stage IV-V: Followed by nephrology.  Continue torsemide.  6.  Hypertension: Currently well-controlled  7.  Obstructive sleep apnea: CPAP compliance encouraged  8.  Secondary hypercoagulable state: Currently on Xarelto for atrial fibrillation  9.  High risk  medication  monitoring: Currently on amiodarone.  TSH elevated.  No other therapy for atrial fibrillation.  Josey Forcier need to follow-up with PCP.   Current medicines are reviewed at length with the patient today.   The patient does not have concerns regarding his medicines.  The following changes were made today: None  Labs/ tests ordered today include:  Orders Placed This Encounter  Procedures   EKG 12-Lead      Disposition:   FU 6 months  Signed, Nakyah Erdmann Jorja Loa, MD  10/12/2022 9:39 AM     [facility] [address] [place] [address] [phone] (office) [phone] (fax)

## 2022-10-12 NOTE — Patient Instructions (Signed)
Medication Instructions:  Your physician recommends that you continue on your current medications as directed. Please refer to the Current Medication list given to you today.  *If you need a refill on your cardiac medications before your next appointment, please call your pharmacy*   Lab Work: None ordered   Testing/Procedures: None ordered   Follow-Up: At [facility], you and your health needs are our priority.  As part of our continuing mission to provide you with exceptional heart care, we have created designated Provider Care Teams.  These Care Teams include your primary Cardiologist (physician) and Advanced Practice Providers (APPs -  Physician Assistants and Nurse Practitioners) who all work together to provide you with the care you need, when you need it.  Your next appointment:   6 month(s)  The format for your next appointment:   In Person  Provider:   You will see one of the following Advanced Practice Providers on your designated Care Team:   Francis Dowse, [place] Casimiro Needle "Mardelle Matte" Lanna Poche, [place]  Thank you for choosing [facility]!!   Dory Horn, RN [phone]  Other Instructions

## 2022-10-28 DIAGNOSIS — I10 Essential (primary) hypertension: Secondary | ICD-10-CM | POA: Diagnosis not present

## 2022-10-28 DIAGNOSIS — G4733 Obstructive sleep apnea (adult) (pediatric): Secondary | ICD-10-CM | POA: Diagnosis not present

## 2022-10-28 DIAGNOSIS — J45909 Unspecified asthma, uncomplicated: Secondary | ICD-10-CM | POA: Diagnosis not present

## 2022-10-28 DIAGNOSIS — G4734 Idiopathic sleep related nonobstructive alveolar hypoventilation: Secondary | ICD-10-CM | POA: Diagnosis not present

## 2022-11-08 ENCOUNTER — Telehealth (INDEPENDENT_AMBULATORY_CARE_PROVIDER_SITE_OTHER): Payer: Medicare PPO | Admitting: Family Medicine

## 2022-11-08 ENCOUNTER — Encounter (INDEPENDENT_AMBULATORY_CARE_PROVIDER_SITE_OTHER): Payer: Self-pay | Admitting: Family Medicine

## 2022-11-08 DIAGNOSIS — E669 Obesity, unspecified: Secondary | ICD-10-CM | POA: Diagnosis not present

## 2022-11-08 DIAGNOSIS — E1165 Type 2 diabetes mellitus with hyperglycemia: Secondary | ICD-10-CM | POA: Diagnosis not present

## 2022-11-08 DIAGNOSIS — N184 Chronic kidney disease, stage 4 (severe): Secondary | ICD-10-CM

## 2022-11-08 DIAGNOSIS — Z794 Long term (current) use of insulin: Secondary | ICD-10-CM

## 2022-11-08 DIAGNOSIS — Z6836 Body mass index (BMI) 36.0-36.9, adult: Secondary | ICD-10-CM | POA: Diagnosis not present

## 2022-11-15 ENCOUNTER — Other Ambulatory Visit: Payer: Self-pay | Admitting: Internal Medicine

## 2022-11-15 DIAGNOSIS — I480 Paroxysmal atrial fibrillation: Secondary | ICD-10-CM

## 2022-11-15 NOTE — Telephone Encounter (Signed)
Prescription refill request for Xarelto received.  Indication: AF Last office visit: 10/12/22  Carleene Mains MD Weight: 960.4VW Age: 77 Scr: 2.66 on 07/06/22 CrCl: 38.42  Based on above findings Xarelto 15mg  daily is the appropriate dose.  Refill approved.

## 2022-11-15 NOTE — Progress Notes (Signed)
TeleHealth Visit:  Due to the COVID-19 pandemic, this visit was completed with telemedicine (audio/video) technology to reduce patient and provider exposure as well as to preserve personal protective equipment.   Mark Singh has verbally consented to this TeleHealth visit. The patient is located at home, the provider is located at home. The participants in this visit include the listed provider and patient. The visit was conducted today via MyChart video.   Chief Complaint: OBESITY Mark Singh is here to discuss his progress with his obesity treatment plan along with follow-up of his obesity related diagnoses. Mark Singh is on the Category 3 Plan and states he is following his eating plan approximately 40-50% of the time. Mark Singh states he is working out for 120 minutes 5 times per week.  Today's visit was #: 7 Starting weight: 265 lbs Starting date: 07/06/2022  Interim History: Patient has been mostly hanging around the house and doing some exercise at the gym because it is too hot to get outdoors. Has switched to the high protein cereal for breakfast and seems like that is doing very well for him.  At lunch he is trying to stay home and eat at home more.  At supper time he is eating microwave dinners.  Subjective:   1. Type 2 diabetes mellitus with hyperglycemia, with long-term current use of insulin (HCC) Patient is on Lantus 20 units nightly, still slightly high carbohydrate intake.  Fasting blood sugars range between 106-139.  2. CKD (chronic kidney disease) stage 4, GFR 15-29 ml/min [facility]) Patient has a kidney doctor with whom he has an upcoming appointment with.  Last creatinine was 2.66 and BUN 32.  Assessment/Plan:   1. Type 2 diabetes mellitus with hyperglycemia, with long-term current use of insulin (HCC) Patient was encouraged to continue to monitor his blood sugars and if fasting blood sugars is in the 100-110's for 2 days, then will decrease Lantus 2 units.  2. CKD (chronic kidney  disease) stage 4, GFR 15-29 ml/min (HCC) Patient has a follow-up appointment at the end of this month.  3. BMI 36.0-36.9,adult  4. Obesity with starting BMI of 39.2 Lynward is currently in the action stage of change. As such, his goal is to continue with weight loss efforts. He has agreed to the Category 3 Plan.   Exercise goals: As is.   Behavioral modification strategies: increasing lean protein intake, meal planning and cooking strategies, and keeping healthy foods in the home.  Mark Singh has agreed to follow-up with our clinic in 4 weeks. He was informed of the importance of frequent follow-up visits to maximize his success with intensive lifestyle modifications for his multiple health conditions.  Objective:   VITALS: Per patient if applicable, see vitals. GENERAL: Alert and in no acute distress. CARDIOPULMONARY: No increased WOB. Speaking in clear sentences.  PSYCH: Pleasant and cooperative. Speech normal rate and rhythm. Affect is appropriate. Insight and judgement are appropriate. Attention is focused, linear, and appropriate.  NEURO: Oriented as arrived to appointment on time with no prompting.   Lab Results  Component Value Date   CREATININE 2.66 (H) 07/06/2022   BUN 32 (H) 07/06/2022   NA 139 07/06/2022   K 4.7 07/06/2022   CL 97 07/06/2022   CO2 25 07/06/2022   Lab Results  Component Value Date   ALT 18 07/06/2022   AST 20 07/06/2022   ALKPHOS 102 07/06/2022   BILITOT 0.6 07/06/2022   Lab Results  Component Value Date   HGBA1C 6.8 (H) 07/06/2022  No results found for: "INSULIN" Lab Results  Component Value Date   TSH 8.280 (H) 07/06/2022   Lab Results  Component Value Date   CHOL 110 06/14/2022   HDL 47 06/14/2022   LDLCALC 42 06/14/2022   TRIG 114 06/14/2022   CHOLHDL 2.3 06/14/2022   Lab Results  Component Value Date   VD25OH 47.2 07/06/2022   Lab Results  Component Value Date   WBC 6.2 07/06/2022   HGB 12.0 (L) 07/06/2022   HCT 37.1 (L)  07/06/2022   MCV 88 07/06/2022   PLT 300 07/06/2022   No results found for: "IRON", "TIBC", "FERRITIN"  Attestation Statements:   Reviewed by clinician on day of visit: allergies, medications, problem list, medical history, surgical history, family history, social history, and previous encounter notes.   I, Burt Knack, am acting as transcriptionist for Reuben Likes, MD.  I have reviewed the above documentation for accuracy and completeness, and I agree with the above. - Reuben Likes, MD

## 2022-11-21 DIAGNOSIS — M7061 Trochanteric bursitis, right hip: Secondary | ICD-10-CM | POA: Diagnosis not present

## 2022-11-22 ENCOUNTER — Encounter (INDEPENDENT_AMBULATORY_CARE_PROVIDER_SITE_OTHER): Payer: Self-pay | Admitting: Family Medicine

## 2022-11-22 ENCOUNTER — Ambulatory Visit (INDEPENDENT_AMBULATORY_CARE_PROVIDER_SITE_OTHER): Payer: Medicare PPO | Admitting: Family Medicine

## 2022-11-22 VITALS — BP 112/56 | HR 61 | Temp 98.0°F | Ht 69.0 in | Wt 243.0 lb

## 2022-11-22 DIAGNOSIS — Z7985 Long-term (current) use of injectable non-insulin antidiabetic drugs: Secondary | ICD-10-CM

## 2022-11-22 DIAGNOSIS — E1165 Type 2 diabetes mellitus with hyperglycemia: Secondary | ICD-10-CM

## 2022-11-22 DIAGNOSIS — I152 Hypertension secondary to endocrine disorders: Secondary | ICD-10-CM

## 2022-11-22 DIAGNOSIS — E669 Obesity, unspecified: Secondary | ICD-10-CM | POA: Diagnosis not present

## 2022-11-22 DIAGNOSIS — E1159 Type 2 diabetes mellitus with other circulatory complications: Secondary | ICD-10-CM | POA: Diagnosis not present

## 2022-11-22 DIAGNOSIS — Z6835 Body mass index (BMI) 35.0-35.9, adult: Secondary | ICD-10-CM | POA: Diagnosis not present

## 2022-11-22 DIAGNOSIS — G4733 Obstructive sleep apnea (adult) (pediatric): Secondary | ICD-10-CM | POA: Diagnosis not present

## 2022-11-22 DIAGNOSIS — Z794 Long term (current) use of insulin: Secondary | ICD-10-CM | POA: Diagnosis not present

## 2022-11-22 NOTE — Progress Notes (Unsigned)
Chief Complaint:   OBESITY Mark Singh is here to discuss his progress with his obesity treatment plan along with follow-up of his obesity related diagnoses. Bartosz is on the Category 3 Plan and states he is following his eating plan approximately 40% of the time. Leandrew states he is at the gym for 2 hours 5 times per week.    Today's visit was #: 8 Starting weight: 265 lbs Starting date: 07/06/2022 Today's weight: 243 lbs Today's date: 11/22/2022 Total lbs lost to date: 22 Total lbs lost since last in-office visit: 6  Interim History: Patient presents for follow up. Had a cortisone shot yesterday and his blood sugar was elevated last night and this am.  He is still doing Lantus 20 units daily. He is following meal plan about 40% of time.  He has been falling into some old patterns of eating out more, eating more sweets and bread.  He recognizes this and states this was due to cravings. He went to the gym today and did a bit and is planning to do a bit tomorrow as well. Next few weeks he doesn't have any plans or events.   Subjective:   1. Type 2 diabetes mellitus with hyperglycemia, with long-term current use of insulin (HCC) Patient is on Lantus 20 units daily and Ozempic 0.5 mg.  He denies hypoglycemia.  He had 1 episode of elevated blood sugars yesterday after cortisone injection.  2. Hypertension associated with diabetes (HCC) Patient's blood pressure is well-controlled today.  He denies chest pain, chest pressure, or headache.  Assessment/Plan:   1. Type 2 diabetes mellitus with hyperglycemia, with long-term current use of insulin (HCC) Patient will continue monitoring his blood sugars; it titrating insulin as tolerated.  2. Hypertension associated with diabetes [facility]) Patient will continue his current medications with no change in dose.  3. BMI 35.0-35.9,adult  4. Obesity with starting BMI of 39.2 Geovanni is currently in the action stage of change. As such, his goal is to  continue with weight loss efforts. He has agreed to the Category 3 Plan.   Exercise goals: All adults should avoid inactivity. Some physical activity is better than none, and adults who participate in any amount of physical activity gain some health benefits.  Behavioral modification strategies: increasing lean protein intake, meal planning and cooking strategies, keeping healthy foods in the home, and planning for success.  Kaison has agreed to follow-up with our clinic in 4 weeks. He was informed of the importance of frequent follow-up visits to maximize his success with intensive lifestyle modifications for his multiple health conditions.   Objective:   Blood pressure (!) 112/56, pulse 61, temperature 98 F (36.7 C), height 5\' 9"  (1.753 m), weight 243 lb (110.2 kg), SpO2 94%. Body mass index is 35.88 kg/m.  General: Cooperative, alert, well developed, in no acute distress. HEENT: Conjunctivae and lids unremarkable. Cardiovascular: Regular rhythm.  Lungs: Normal work of breathing. Neurologic: No focal deficits.   Lab Results  Component Value Date   CREATININE 2.66 (H) 07/06/2022   BUN 32 (H) 07/06/2022   NA 139 07/06/2022   K 4.7 07/06/2022   CL 97 07/06/2022   CO2 25 07/06/2022   Lab Results  Component Value Date   ALT 18 07/06/2022   AST 20 07/06/2022   ALKPHOS 102 07/06/2022   BILITOT 0.6 07/06/2022   Lab Results  Component Value Date   HGBA1C 6.8 (H) 07/06/2022   No results found for: "INSULIN" Lab Results  Component  Value Date   TSH 8.280 (H) 07/06/2022   Lab Results  Component Value Date   CHOL 110 06/14/2022   HDL 47 06/14/2022   LDLCALC 42 06/14/2022   TRIG 114 06/14/2022   CHOLHDL 2.3 06/14/2022   Lab Results  Component Value Date   VD25OH 47.2 07/06/2022   Lab Results  Component Value Date   WBC 6.2 07/06/2022   HGB 12.0 (L) 07/06/2022   HCT 37.1 (L) 07/06/2022   MCV 88 07/06/2022   PLT 300 07/06/2022   No results found for: "IRON",  "TIBC", "FERRITIN"  Attestation Statements:   Reviewed by clinician on day of visit: allergies, medications, problem list, medical history, surgical history, family history, social history, and previous encounter notes.   I, Burt Knack, am acting as transcriptionist for Reuben Likes, MD.  I have reviewed the above documentation for accuracy and completeness, and I agree with the above. - Reuben Likes, MD

## 2022-11-27 DIAGNOSIS — G4733 Obstructive sleep apnea (adult) (pediatric): Secondary | ICD-10-CM | POA: Diagnosis not present

## 2022-11-27 DIAGNOSIS — J45909 Unspecified asthma, uncomplicated: Secondary | ICD-10-CM | POA: Diagnosis not present

## 2022-11-27 DIAGNOSIS — I10 Essential (primary) hypertension: Secondary | ICD-10-CM | POA: Diagnosis not present

## 2022-11-27 DIAGNOSIS — G4734 Idiopathic sleep related nonobstructive alveolar hypoventilation: Secondary | ICD-10-CM | POA: Diagnosis not present

## 2022-11-30 DIAGNOSIS — I130 Hypertensive heart and chronic kidney disease with heart failure and stage 1 through stage 4 chronic kidney disease, or unspecified chronic kidney disease: Secondary | ICD-10-CM | POA: Diagnosis not present

## 2022-11-30 DIAGNOSIS — E1122 Type 2 diabetes mellitus with diabetic chronic kidney disease: Secondary | ICD-10-CM | POA: Diagnosis not present

## 2022-11-30 DIAGNOSIS — Z6841 Body Mass Index (BMI) 40.0 and over, adult: Secondary | ICD-10-CM | POA: Diagnosis not present

## 2022-11-30 DIAGNOSIS — I4819 Other persistent atrial fibrillation: Secondary | ICD-10-CM | POA: Diagnosis not present

## 2022-11-30 DIAGNOSIS — N184 Chronic kidney disease, stage 4 (severe): Secondary | ICD-10-CM | POA: Diagnosis not present

## 2022-11-30 DIAGNOSIS — I5030 Unspecified diastolic (congestive) heart failure: Secondary | ICD-10-CM | POA: Diagnosis not present

## 2022-11-30 DIAGNOSIS — N041 Nephrotic syndrome with focal and segmental glomerular lesions: Secondary | ICD-10-CM | POA: Diagnosis not present

## 2022-12-07 DIAGNOSIS — L814 Other melanin hyperpigmentation: Secondary | ICD-10-CM | POA: Diagnosis not present

## 2022-12-07 DIAGNOSIS — Z85828 Personal history of other malignant neoplasm of skin: Secondary | ICD-10-CM | POA: Diagnosis not present

## 2022-12-07 DIAGNOSIS — D225 Melanocytic nevi of trunk: Secondary | ICD-10-CM | POA: Diagnosis not present

## 2022-12-07 DIAGNOSIS — L821 Other seborrheic keratosis: Secondary | ICD-10-CM | POA: Diagnosis not present

## 2022-12-07 DIAGNOSIS — L570 Actinic keratosis: Secondary | ICD-10-CM | POA: Diagnosis not present

## 2022-12-07 DIAGNOSIS — L578 Other skin changes due to chronic exposure to nonionizing radiation: Secondary | ICD-10-CM | POA: Diagnosis not present

## 2022-12-07 DIAGNOSIS — L219 Seborrheic dermatitis, unspecified: Secondary | ICD-10-CM | POA: Diagnosis not present

## 2022-12-20 ENCOUNTER — Ambulatory Visit (INDEPENDENT_AMBULATORY_CARE_PROVIDER_SITE_OTHER): Payer: Medicare PPO | Admitting: Family Medicine

## 2022-12-20 ENCOUNTER — Encounter (INDEPENDENT_AMBULATORY_CARE_PROVIDER_SITE_OTHER): Payer: Self-pay | Admitting: Family Medicine

## 2022-12-20 VITALS — BP 106/53 | HR 68 | Temp 97.7°F | Ht 69.0 in | Wt 239.0 lb

## 2022-12-20 DIAGNOSIS — N184 Chronic kidney disease, stage 4 (severe): Secondary | ICD-10-CM

## 2022-12-20 DIAGNOSIS — Z794 Long term (current) use of insulin: Secondary | ICD-10-CM | POA: Diagnosis not present

## 2022-12-20 DIAGNOSIS — Z6835 Body mass index (BMI) 35.0-35.9, adult: Secondary | ICD-10-CM | POA: Diagnosis not present

## 2022-12-20 DIAGNOSIS — I350 Nonrheumatic aortic (valve) stenosis: Secondary | ICD-10-CM | POA: Diagnosis not present

## 2022-12-20 DIAGNOSIS — E1122 Type 2 diabetes mellitus with diabetic chronic kidney disease: Secondary | ICD-10-CM | POA: Diagnosis not present

## 2022-12-20 DIAGNOSIS — E669 Obesity, unspecified: Secondary | ICD-10-CM | POA: Diagnosis not present

## 2022-12-20 NOTE — Progress Notes (Signed)
Chief Complaint:   OBESITY Mark Singh is here to discuss his progress with his obesity treatment plan along with follow-up of his obesity related diagnoses. Mark Singh is on the Category 3 Plan and states he is following his eating plan approximately 50% of the time. Mark Singh states he is at the gym for 2 hours 5 times per week.    Today's visit was #: 9 Starting weight: 265 lbs Starting date: 07/16/2022 Today's weight: 239 lbs Today's date: 12/20/2022 Total lbs lost to date: 26 Total lbs lost since last in-office visit: 4  Interim History: Patient has been consistently at the gym over the last few weeks.  He did start to pull himself out of the habits he had fallen back into last appointment.  He is eating out everyday at lunch and maybe once a month at night.  The other meals he is focusing on incorporating enough protein by supplementing with a protein shake.  Supper tends to be the lightest in terms of protein he is eating.  He does supplement with a protein shake.  Next few weeks he doesn't have much in terms of plans.  He is doing some yard work and plans to continue at [employer]   Subjective:   1. Type 2 diabetes mellitus with stage 4 chronic kidney disease, with long-term current use of insulin (HCC) Patient's fasting blood sugars range between 100-120's mostly (few in the 130's).  No GI side effects were noted or hypoglycemia.  2. Aortic valve stenosis, etiology of cardiac valve disease unspecified Patient saw Cardiology in May (management deferred to Neurology).  His blood pressure is on the lower end of normal, and he does report occasionally feeling lightheaded.  Assessment/Plan:   1. Type 2 diabetes mellitus with stage 4 chronic kidney disease, with long-term current use of insulin (HCC) Patient is to decrease Lantus to 17 units daily.  We will follow-up on his blood sugars at his next appointment.  He will have labs done with his PCP next month.  Ultimate goal will be to slowly  titrate down on Lantus until patient is at 10 units daily with fasting blood sugars in the 120s or below and then stop exogenous insulin.  2. Aortic valve stenosis, etiology of cardiac valve disease unspecified Patient was encouraged to keep track of his symptoms and reach out to Nephrology if his symptoms increase in frequency given concern of blood pressure being too low to profuse due to stenosis.  3. BMI 35.0-35.9,adult  4. Obesity with starting BMI of 39.2 Mark Singh is currently in the action stage of change. As such, his goal is to continue with weight loss efforts. He has agreed to the Category 3 Plan.   Exercise goals: As is.   Behavioral modification strategies: increasing lean protein intake, meal planning and cooking strategies, keeping healthy foods in the home, and planning for success.  Mark Singh has agreed to follow-up with our clinic in 5 weeks. He was informed of the importance of frequent follow-up visits to maximize his success with intensive lifestyle modifications for his multiple health conditions.   Objective:   Blood pressure (!) 106/53, pulse 68, temperature 97.7 F (36.5 C), height 5\' 9"  (1.753 m), weight 239 lb (108.4 kg), SpO2 95%. Body mass index is 35.29 kg/m.  General: Cooperative, alert, well developed, in no acute distress. HEENT: Conjunctivae and lids unremarkable. Cardiovascular: Regular rhythm.  Lungs: Normal work of breathing. Neurologic: No focal deficits.   Lab Results  Component Value Date  CREATININE 2.66 (H) 07/06/2022   BUN 32 (H) 07/06/2022   NA 139 07/06/2022   K 4.7 07/06/2022   CL 97 07/06/2022   CO2 25 07/06/2022   Lab Results  Component Value Date   ALT 18 07/06/2022   AST 20 07/06/2022   ALKPHOS 102 07/06/2022   BILITOT 0.6 07/06/2022   Lab Results  Component Value Date   HGBA1C 6.8 (H) 07/06/2022   No results found for: "INSULIN" Lab Results  Component Value Date   TSH 8.280 (H) 07/06/2022   Lab Results  Component  Value Date   CHOL 110 06/14/2022   HDL 47 06/14/2022   LDLCALC 42 06/14/2022   TRIG 114 06/14/2022   CHOLHDL 2.3 06/14/2022   Lab Results  Component Value Date   VD25OH 47.2 07/06/2022   Lab Results  Component Value Date   WBC 6.2 07/06/2022   HGB 12.0 (L) 07/06/2022   HCT 37.1 (L) 07/06/2022   MCV 88 07/06/2022   PLT 300 07/06/2022   No results found for: "IRON", "TIBC", "FERRITIN"  Attestation Statements:   Reviewed by clinician on day of visit: allergies, medications, problem list, medical history, surgical history, family history, social history, and previous encounter notes.   I, Burt Knack, am acting as transcriptionist for Reuben Likes, MD. I have reviewed the above documentation for accuracy and completeness, and I agree with the above. - Reuben Likes, MD

## 2022-12-28 DIAGNOSIS — I10 Essential (primary) hypertension: Secondary | ICD-10-CM | POA: Diagnosis not present

## 2022-12-28 DIAGNOSIS — J45909 Unspecified asthma, uncomplicated: Secondary | ICD-10-CM | POA: Diagnosis not present

## 2022-12-28 DIAGNOSIS — G4734 Idiopathic sleep related nonobstructive alveolar hypoventilation: Secondary | ICD-10-CM | POA: Diagnosis not present

## 2022-12-28 DIAGNOSIS — G4733 Obstructive sleep apnea (adult) (pediatric): Secondary | ICD-10-CM | POA: Diagnosis not present

## 2023-01-05 ENCOUNTER — Other Ambulatory Visit: Payer: Self-pay | Admitting: Cardiology

## 2023-01-18 DIAGNOSIS — R946 Abnormal results of thyroid function studies: Secondary | ICD-10-CM | POA: Diagnosis not present

## 2023-01-18 DIAGNOSIS — K219 Gastro-esophageal reflux disease without esophagitis: Secondary | ICD-10-CM | POA: Diagnosis not present

## 2023-01-18 DIAGNOSIS — R7989 Other specified abnormal findings of blood chemistry: Secondary | ICD-10-CM | POA: Diagnosis not present

## 2023-01-18 DIAGNOSIS — N184 Chronic kidney disease, stage 4 (severe): Secondary | ICD-10-CM | POA: Diagnosis not present

## 2023-01-18 DIAGNOSIS — J4530 Mild persistent asthma, uncomplicated: Secondary | ICD-10-CM | POA: Diagnosis not present

## 2023-01-18 DIAGNOSIS — Z6836 Body mass index (BMI) 36.0-36.9, adult: Secondary | ICD-10-CM | POA: Diagnosis not present

## 2023-01-18 DIAGNOSIS — E1122 Type 2 diabetes mellitus with diabetic chronic kidney disease: Secondary | ICD-10-CM | POA: Diagnosis not present

## 2023-01-20 LAB — LAB REPORT - SCANNED
Albumin, Urine POC: <0.7
Creatinine, POC: 43 mg/dL
Microalb Creat Ratio: <16.3

## 2023-01-22 ENCOUNTER — Other Ambulatory Visit: Payer: Self-pay | Admitting: Cardiology

## 2023-01-26 ENCOUNTER — Ambulatory Visit (INDEPENDENT_AMBULATORY_CARE_PROVIDER_SITE_OTHER): Payer: Medicare PPO | Admitting: Family Medicine

## 2023-01-26 ENCOUNTER — Encounter (INDEPENDENT_AMBULATORY_CARE_PROVIDER_SITE_OTHER): Payer: Self-pay | Admitting: Family Medicine

## 2023-01-26 VITALS — BP 95/51 | HR 59 | Temp 98.1°F | Ht 69.0 in | Wt 238.0 lb

## 2023-01-26 DIAGNOSIS — Z794 Long term (current) use of insulin: Secondary | ICD-10-CM | POA: Diagnosis not present

## 2023-01-26 DIAGNOSIS — E669 Obesity, unspecified: Secondary | ICD-10-CM | POA: Diagnosis not present

## 2023-01-26 DIAGNOSIS — Z6835 Body mass index (BMI) 35.0-35.9, adult: Secondary | ICD-10-CM | POA: Diagnosis not present

## 2023-01-26 DIAGNOSIS — E1165 Type 2 diabetes mellitus with hyperglycemia: Secondary | ICD-10-CM

## 2023-01-26 DIAGNOSIS — Z7985 Long-term (current) use of injectable non-insulin antidiabetic drugs: Secondary | ICD-10-CM

## 2023-01-26 DIAGNOSIS — R7989 Other specified abnormal findings of blood chemistry: Secondary | ICD-10-CM

## 2023-01-26 NOTE — Progress Notes (Signed)
Chief Complaint:   OBESITY Mark Singh is here to discuss his progress with his obesity treatment plan along with follow-up of his obesity related diagnoses. Mark Singh is on the Category 3 Plan and states he is following his eating plan approximately 50% of the time. Mark Singh states he is at the gym for 2 hours 5 times per week.    Today's visit was #: 10 Starting weight: 265 lbs Starting date: 07/16/2022 Today's weight: 238 lbs Today's date: 01/26/2023 Total lbs lost to date: 27 Total lbs lost since last in-office visit: 1  Interim History: Patient has been trying to stay focused on food and nutrition but went on a few trips and then found it hard to get back on plan.  He is realizing he is sliding into old habits. He acknowledges he needs to get his head wrapped back around where it needs to be.  Thinks he will go to the grocery store this afternoon and get food for meal plan back into his house. He is going to [facility] mid October for a couple days.   Subjective:   1. Type 2 diabetes mellitus with hyperglycemia, with long-term current use of insulin (HCC) Patient's A1c was 6.6 with his PCP (down from 6.8).  He is on Ozempic and insulin (17 units).  His fasting blood sugars range between 100-120s.  2. Elevated TSH Patient's TSH was 8.65.  He does not have a history of thyroid dysfunction, except his last TSH here was 8.28.  He has no symptoms of hypothyroidism.  Assessment/Plan:   1. Type 2 diabetes mellitus with hyperglycemia, with long-term current use of insulin (HCC) Patient will continue Ozempic and insulin, and we will decrease insulin as his blood sugar tolerates.  2. Elevated TSH We will repeat TSH in 2 months.  3. BMI 35.0-35.9,adult  4. Obesity with starting BMI of 39.2 Mark Singh is currently in the action stage of change. As such, his goal is to continue with weight loss efforts. He has agreed to the Category 3 Plan.   Exercise goals: As is.   Behavioral  modification strategies: increasing lean protein intake, meal planning and cooking strategies, keeping healthy foods in the home, and planning for success.  Mark Singh has agreed to follow-up with our clinic in 5 to 6 weeks. He was informed of the importance of frequent follow-up visits to maximize his success with intensive lifestyle modifications for his multiple health conditions.   Objective:   Blood pressure (!) 95/51, pulse (!) 59, temperature 98.1 F (36.7 C), height 5\' 9"  (1.753 m), weight 238 lb (108 kg), SpO2 95%. Body mass index is 35.15 kg/m.  General: Cooperative, alert, well developed, in no acute distress. HEENT: Conjunctivae and lids unremarkable. Cardiovascular: Regular rhythm.  Lungs: Normal work of breathing. Neurologic: No focal deficits.   Lab Results  Component Value Date   CREATININE 2.66 (H) 07/06/2022   BUN 32 (H) 07/06/2022   NA 139 07/06/2022   K 4.7 07/06/2022   CL 97 07/06/2022   CO2 25 07/06/2022   Lab Results  Component Value Date   ALT 18 07/06/2022   AST 20 07/06/2022   ALKPHOS 102 07/06/2022   BILITOT 0.6 07/06/2022   Lab Results  Component Value Date   HGBA1C 6.8 (H) 07/06/2022   No results found for: "INSULIN" Lab Results  Component Value Date   TSH 8.280 (H) 07/06/2022   Lab Results  Component Value Date   CHOL 110 06/14/2022   HDL 47 06/14/2022  LDLCALC 42 06/14/2022   TRIG 114 06/14/2022   CHOLHDL 2.3 06/14/2022   Lab Results  Component Value Date   VD25OH 47.2 07/06/2022   Lab Results  Component Value Date   WBC 6.2 07/06/2022   HGB 12.0 (L) 07/06/2022   HCT 37.1 (L) 07/06/2022   MCV 88 07/06/2022   PLT 300 07/06/2022   No results found for: "IRON", "TIBC", "FERRITIN"  Attestation Statements:   Reviewed by clinician on day of visit: allergies, medications, problem list, medical history, surgical history, family history, social history, and previous encounter notes.   I, Burt Knack, am acting as  transcriptionist for Reuben Likes, MD.  I have reviewed the above documentation for accuracy and completeness, and I agree with the above. - Reuben Likes, MD

## 2023-01-28 DIAGNOSIS — J45909 Unspecified asthma, uncomplicated: Secondary | ICD-10-CM | POA: Diagnosis not present

## 2023-01-28 DIAGNOSIS — I10 Essential (primary) hypertension: Secondary | ICD-10-CM | POA: Diagnosis not present

## 2023-01-28 DIAGNOSIS — G4734 Idiopathic sleep related nonobstructive alveolar hypoventilation: Secondary | ICD-10-CM | POA: Diagnosis not present

## 2023-01-28 DIAGNOSIS — G4733 Obstructive sleep apnea (adult) (pediatric): Secondary | ICD-10-CM | POA: Diagnosis not present

## 2023-02-12 ENCOUNTER — Other Ambulatory Visit: Payer: Self-pay | Admitting: Cardiology

## 2023-02-25 ENCOUNTER — Other Ambulatory Visit: Payer: Self-pay | Admitting: Cardiology

## 2023-02-27 DIAGNOSIS — G4733 Obstructive sleep apnea (adult) (pediatric): Secondary | ICD-10-CM | POA: Diagnosis not present

## 2023-02-27 DIAGNOSIS — I10 Essential (primary) hypertension: Secondary | ICD-10-CM | POA: Diagnosis not present

## 2023-02-27 DIAGNOSIS — G4734 Idiopathic sleep related nonobstructive alveolar hypoventilation: Secondary | ICD-10-CM | POA: Diagnosis not present

## 2023-02-27 DIAGNOSIS — J45909 Unspecified asthma, uncomplicated: Secondary | ICD-10-CM | POA: Diagnosis not present

## 2023-03-02 DIAGNOSIS — I5030 Unspecified diastolic (congestive) heart failure: Secondary | ICD-10-CM | POA: Diagnosis not present

## 2023-03-02 DIAGNOSIS — E1122 Type 2 diabetes mellitus with diabetic chronic kidney disease: Secondary | ICD-10-CM | POA: Diagnosis not present

## 2023-03-02 DIAGNOSIS — I130 Hypertensive heart and chronic kidney disease with heart failure and stage 1 through stage 4 chronic kidney disease, or unspecified chronic kidney disease: Secondary | ICD-10-CM | POA: Diagnosis not present

## 2023-03-02 DIAGNOSIS — I4819 Other persistent atrial fibrillation: Secondary | ICD-10-CM | POA: Diagnosis not present

## 2023-03-02 DIAGNOSIS — Z6841 Body Mass Index (BMI) 40.0 and over, adult: Secondary | ICD-10-CM | POA: Diagnosis not present

## 2023-03-02 DIAGNOSIS — N041 Nephrotic syndrome with focal and segmental glomerular lesions: Secondary | ICD-10-CM | POA: Diagnosis not present

## 2023-03-02 DIAGNOSIS — N184 Chronic kidney disease, stage 4 (severe): Secondary | ICD-10-CM | POA: Diagnosis not present

## 2023-03-02 LAB — BASIC METABOLIC PANEL
BUN: 34 — AB (ref 4–21)
Chloride: 102 (ref 99–108)
Creatinine: 2.6 — AB (ref 0.6–1.3)
Glucose: 149
Potassium: 4.2 meq/L (ref 3.5–5.1)
Sodium: 141 (ref 137–147)

## 2023-03-02 LAB — CBC AND DIFFERENTIAL
Hemoglobin: 12.3 — AB (ref 13.5–17.5)
WBC: 6

## 2023-03-02 LAB — COMPREHENSIVE METABOLIC PANEL: Calcium: 9 (ref 8.7–10.7)

## 2023-03-02 LAB — CBC: RBC: 4.18 (ref 3.87–5.11)

## 2023-03-07 ENCOUNTER — Other Ambulatory Visit: Payer: Self-pay

## 2023-03-07 DIAGNOSIS — I350 Nonrheumatic aortic (valve) stenosis: Secondary | ICD-10-CM

## 2023-03-09 ENCOUNTER — Ambulatory Visit (INDEPENDENT_AMBULATORY_CARE_PROVIDER_SITE_OTHER): Payer: Medicare PPO | Admitting: Family Medicine

## 2023-03-09 ENCOUNTER — Encounter (INDEPENDENT_AMBULATORY_CARE_PROVIDER_SITE_OTHER): Payer: Self-pay | Admitting: Family Medicine

## 2023-03-09 VITALS — BP 113/57 | HR 58 | Temp 98.2°F | Ht 69.0 in | Wt 237.0 lb

## 2023-03-09 DIAGNOSIS — Z794 Long term (current) use of insulin: Secondary | ICD-10-CM | POA: Diagnosis not present

## 2023-03-09 DIAGNOSIS — Z6835 Body mass index (BMI) 35.0-35.9, adult: Secondary | ICD-10-CM

## 2023-03-09 DIAGNOSIS — E1122 Type 2 diabetes mellitus with diabetic chronic kidney disease: Secondary | ICD-10-CM

## 2023-03-09 DIAGNOSIS — R7989 Other specified abnormal findings of blood chemistry: Secondary | ICD-10-CM

## 2023-03-09 DIAGNOSIS — Z7985 Long-term (current) use of injectable non-insulin antidiabetic drugs: Secondary | ICD-10-CM

## 2023-03-09 DIAGNOSIS — N184 Chronic kidney disease, stage 4 (severe): Secondary | ICD-10-CM | POA: Diagnosis not present

## 2023-03-09 DIAGNOSIS — E669 Obesity, unspecified: Secondary | ICD-10-CM

## 2023-03-09 NOTE — Assessment & Plan Note (Signed)
Creatinine on his labs by PCP was 2.55 which is about what his prior creatinine was.  Patient sees [facility] and sees Dr. Arrie Aran.  Continue to monitor.

## 2023-03-09 NOTE — Assessment & Plan Note (Signed)
TSH has been high for quite some time but not above 10.  No interventions have been made as patient is not experiencing any symptoms currently.

## 2023-03-09 NOTE — Progress Notes (Signed)
SUBJECTIVE:  Chief Complaint: Obesity  Interim History: Patient has been able to be consistent about 50% of the time with his meal plan.  He has been on a couple trips for three days and tried to eat out a few times. Nothing coming up prior to Thanksgiving but then he will be going to one of his niece's for "open house".  No current plans for December.  He is still doing a significant amount of activity and is exercising about 120 minutes 5 times a week.   Mark Singh is here to discuss his progress with his obesity treatment plan. He is on the Category 3 Plan and states he is following his eating plan approximately 50 % of the time. He states he is exercising 120 minutes 5 times per week.   OBJECTIVE: Visit Diagnoses: Problem List Items Addressed This Visit       Endocrine   Type 2 diabetes mellitus with stage 4 chronic kidney disease, with long-term current use of insulin (HCC)    Patient is now taking 10 units of lantus a day.  His blood sugars have been in the 120s and low 130s since that decrease. He mentions that he would like to have his trainer give him a grocery list and make a menu for him to be more consistent with getting his nutrition in.  He is on ozempic, lantus, ARB and statin.         Genitourinary   CKD (chronic kidney disease) stage 4, GFR 15-29 ml/min (HCC)    Creatinine on his labs by PCP was 2.55 which is about what his prior creatinine was.  Patient sees [facility] and sees Dr. Arrie Aran.  Continue to monitor.        Other   Abnormal TSH - Primary    TSH has been high for quite some time but not above 10.  No interventions have been made as patient is not experiencing any symptoms currently.        Vitals Temp: 98.2 F (36.8 C) BP: (!) 113/57 Pulse Rate: (!) 58 SpO2: 95 %   Anthropometric Measurements Height: 5\' 9"  (1.753 m) Weight: 237 lb (107.5 kg) BMI (Calculated): 34.98 Weight at Last Visit: 238 lb Weight Lost Since Last Visit:  1 Weight Gained Since Last Visit: 0 Starting Weight: 265 lb Total Weight Loss (lbs): 28 lb (12.7 kg)   Body Composition  Body Fat %: 35.7 % Fat Mass (lbs): 84.8 lbs Muscle Mass (lbs): 145.2 lbs Total Body Water (lbs): 114.8 lbs Visceral Fat Rating : 24   Other Clinical Data Today's Visit #: 10 Starting Date: 07/06/22     ASSESSMENT AND PLAN:  Diet: Mark Singh is currently in the action stage of change. As such, his goal is to continue with weight loss efforts. He has agreed to Category 3 Plan.  Exercise: Mark Singh has been instructed to continue exercising as is for weight loss and overall health benefits.   Behavior Modification:  We discussed the following Behavioral Modification Strategies today: increasing lean protein intake, decreasing eating out, meal planning and cooking strategies, and keeping healthy foods in the home.   No follow-ups on file.[name] He was informed of the importance of frequent follow up visits to maximize his success with intensive lifestyle modifications for his multiple health conditions.  Attestation Statements:   Reviewed by clinician on day of visit: allergies, medications, problem list, medical history, surgical history, family history, social history, and previous encounter notes.   Mark Likes, MD

## 2023-03-09 NOTE — Assessment & Plan Note (Signed)
Patient is now taking 10 units of lantus a day.  His blood sugars have been in the 120s and low 130s since that decrease. He mentions that he would like to have his trainer give him a grocery list and make a menu for him to be more consistent with getting his nutrition in.  He is on ozempic, lantus, ARB and statin.

## 2023-03-20 DIAGNOSIS — Z0000 Encounter for general adult medical examination without abnormal findings: Secondary | ICD-10-CM | POA: Diagnosis not present

## 2023-03-20 DIAGNOSIS — Z6835 Body mass index (BMI) 35.0-35.9, adult: Secondary | ICD-10-CM | POA: Diagnosis not present

## 2023-03-20 DIAGNOSIS — Z1331 Encounter for screening for depression: Secondary | ICD-10-CM | POA: Diagnosis not present

## 2023-03-23 DIAGNOSIS — M1712 Unilateral primary osteoarthritis, left knee: Secondary | ICD-10-CM | POA: Diagnosis not present

## 2023-03-23 DIAGNOSIS — M25562 Pain in left knee: Secondary | ICD-10-CM | POA: Diagnosis not present

## 2023-03-28 DIAGNOSIS — G4733 Obstructive sleep apnea (adult) (pediatric): Secondary | ICD-10-CM | POA: Diagnosis not present

## 2023-03-30 DIAGNOSIS — G4734 Idiopathic sleep related nonobstructive alveolar hypoventilation: Secondary | ICD-10-CM | POA: Diagnosis not present

## 2023-03-30 DIAGNOSIS — J45909 Unspecified asthma, uncomplicated: Secondary | ICD-10-CM | POA: Diagnosis not present

## 2023-03-30 DIAGNOSIS — G4733 Obstructive sleep apnea (adult) (pediatric): Secondary | ICD-10-CM | POA: Diagnosis not present

## 2023-03-30 DIAGNOSIS — I10 Essential (primary) hypertension: Secondary | ICD-10-CM | POA: Diagnosis not present

## 2023-04-03 DIAGNOSIS — M25662 Stiffness of left knee, not elsewhere classified: Secondary | ICD-10-CM | POA: Diagnosis not present

## 2023-04-03 DIAGNOSIS — R262 Difficulty in walking, not elsewhere classified: Secondary | ICD-10-CM | POA: Diagnosis not present

## 2023-04-03 DIAGNOSIS — R531 Weakness: Secondary | ICD-10-CM | POA: Diagnosis not present

## 2023-04-03 DIAGNOSIS — M25562 Pain in left knee: Secondary | ICD-10-CM | POA: Diagnosis not present

## 2023-04-04 ENCOUNTER — Encounter: Payer: Self-pay | Admitting: Cardiology

## 2023-04-04 ENCOUNTER — Ambulatory Visit: Payer: Medicare PPO | Attending: Cardiology | Admitting: Cardiology

## 2023-04-04 VITALS — BP 98/56 | HR 56 | Ht 69.0 in | Wt 246.2 lb

## 2023-04-04 DIAGNOSIS — Z79899 Other long term (current) drug therapy: Secondary | ICD-10-CM

## 2023-04-04 DIAGNOSIS — D6869 Other thrombophilia: Secondary | ICD-10-CM

## 2023-04-04 DIAGNOSIS — I10 Essential (primary) hypertension: Secondary | ICD-10-CM

## 2023-04-04 DIAGNOSIS — I483 Typical atrial flutter: Secondary | ICD-10-CM | POA: Diagnosis not present

## 2023-04-04 DIAGNOSIS — I493 Ventricular premature depolarization: Secondary | ICD-10-CM | POA: Diagnosis not present

## 2023-04-04 DIAGNOSIS — I4819 Other persistent atrial fibrillation: Secondary | ICD-10-CM

## 2023-04-04 DIAGNOSIS — G4733 Obstructive sleep apnea (adult) (pediatric): Secondary | ICD-10-CM | POA: Diagnosis not present

## 2023-04-04 MED ORDER — AMLODIPINE BESYLATE 5 MG PO TABS: 5.0000 mg | ORAL_TABLET | Freq: Every day | ORAL | 3 refills | Status: DC

## 2023-04-04 MED ORDER — AMIODARONE HCL 200 MG PO TABS: 100.0000 mg | ORAL_TABLET | Freq: Every day | ORAL | 3 refills | Status: DC

## 2023-04-04 NOTE — Progress Notes (Signed)
Electrophysiology Office Note:   Date:  04/04/2023  ID:  WAHAB SCARPINATO, DOB 12-17-1945, MRN [id number]  Primary Cardiologist: Donato Schultz, MD Electrophysiologist: Itzabella Sorrels Jorja Loa, MD      History of Present Illness:   Mark Singh is a 77 y.o. male with h/o PVCs, nephrotic syndrome, diabetes, hypertension, hyperlipidemia, obesity, pulmonary hypertension seen today for routine electrophysiology followup.   He is post PVC ablation 03/30/2015, atrial fibrillation ablation 12/13/2017, atrial flutter ablation 02/26/2018.  He is on amiodarone for atrial fibrillation.  Since last being seen in our clinic the patient reports doing overall well.  He has noted no further episodes of atrial fibrillation.  He does occasionally have dizziness upon standing.  His blood pressure at home is in the 110-120 systolic.  he denies chest pain, palpitations, dyspnea, PND, orthopnea, nausea, vomiting, syncope, edema, weight gain, or early satiety.   Review of systems complete and found to be negative unless listed in HPI.   EP Information / Studies Reviewed:    EKG is ordered today. Personal review as below.  EKG Interpretation Date/Time:  Tuesday April 04 2023 09:53:42 EST Ventricular Rate:  56 PR Interval:  136 QRS Duration:  94 QT Interval:  426 QTC Calculation: 411 R Axis:   25  Text Interpretation: Sinus rhythm When compared with ECG of 24-Nov-2021 14:59, No significant change since last tracing Confirmed by Danita Proud ([id number]) on 04/04/2023 10:01:14 AM     Risk Assessment/Calculations:    CHA2DS2-VASc Score = 3   This indicates a 3.2% annual risk of stroke. The patient's score is based upon: CHF History: 0 HTN History: 1 Diabetes History: 0 Stroke History: 0 Vascular Disease History: 0 Age Score: 2 Gender Score: 0             Physical Exam:   VS:  BP (!) 98/56 (BP Location: Left Arm, Patient Position: Sitting, Cuff Size: Large)   Pulse (!) 56   Ht 5\' 9"   (1.753 m)   Wt 246 lb 3.2 oz (111.7 kg)   SpO2 97%   BMI 36.36 kg/m    Wt Readings from Last 3 Encounters:  04/04/23 246 lb 3.2 oz (111.7 kg)  03/09/23 237 lb (107.5 kg)  01/26/23 238 lb (108 kg)     GEN: Well nourished, well developed in no acute distress NECK: No JVD; No carotid bruits CARDIAC: Regular rate and rhythm, no murmurs, rubs, gallops RESPIRATORY:  Clear to auscultation without rales, wheezing or rhonchi  ABDOMEN: Soft, non-tender, non-distended EXTREMITIES:  No edema; No deformity   ASSESSMENT AND PLAN:    1.  PVCs: Post ablation 03/10/2015.  No obvious recurrence.  2.  Persistent atrial fibrillation: Post ablation 12/23/2017.  Currently on Xarelto and amiodarone.  He has had no further episodes of atrial fibrillation.  His TSH is elevated, but thyroid hormones are within normal limits.  Dione Petron reduce amiodarone to 100 mg daily.  I did offer ablation, but he would like to hold off for now.  3.  Typical atrial flutter: Post ablation 03/29/2018  4.  Chronic diastolic heart failure: No obvious volume overload  5.  CKD stage IV-V: Followed by nephrology.  6.  Hypertension: Has been having dizziness upon standing.  Blood pressure is low today.  Corisa Montini reduce amlodipine to 5 mg.  7.  Obstructive sleep apnea: CPAP compliance encouraged  8.  Secondary hypercoagulable state: Currently on Xarelto for atrial fibrillation  Follow up with [facility] Clinic in 6 months  Signed, Bailey Kolbe Jorja Loa,  MD

## 2023-04-04 NOTE — Patient Instructions (Addendum)
Medication Instructions:  Your physician has recommended you make the following change in your medication:  DECREASE Amiodarone to 100 mg daily DECREASE Amlodipine to 5 mg daily  *If you need a refill on your cardiac medications before your next appointment, please call your pharmacy*   Lab Work: Today, Amiodarone surveillance lab: LFTs If you have labs (blood work) drawn today and your tests are completely normal, you will receive your results only by: MyChart Message (if you have MyChart) OR A paper copy in the mail If you have any lab test that is abnormal or we need to change your treatment, we will call you to review the results.   Testing/Procedures: None ordered   Follow-Up: At [facility], you and your health needs are our priority.  As part of our continuing mission to provide you with exceptional heart care, we have created designated Provider Care Teams.  These Care Teams include your primary Cardiologist (physician) and Advanced Practice Providers (APPs -  Physician Assistants and Nurse Practitioners) who all work together to provide you with the care you need, when you need it.  Your next appointment:   6 month(s)  The format for your next appointment:   In Person  Provider:   Loman Brooklyn, MD    Thank you for choosing [facility]!!   Dory Horn, RN [phone]

## 2023-04-05 LAB — HEPATIC FUNCTION PANEL
ALT: 16 [IU]/L (ref 0–44)
AST: 17 [IU]/L (ref 0–40)
Albumin: 4.4 g/dL (ref 3.8–4.8)
Alkaline Phosphatase: 93 [IU]/L (ref 44–121)
Bilirubin Total: 0.5 mg/dL (ref 0.0–1.2)
Bilirubin, Direct: 0.21 mg/dL (ref 0.00–0.40)
Total Protein: 7.3 g/dL (ref 6.0–8.5)

## 2023-04-10 ENCOUNTER — Ambulatory Visit (INDEPENDENT_AMBULATORY_CARE_PROVIDER_SITE_OTHER): Payer: Medicare PPO | Admitting: Family Medicine

## 2023-04-10 ENCOUNTER — Encounter (INDEPENDENT_AMBULATORY_CARE_PROVIDER_SITE_OTHER): Payer: Self-pay | Admitting: Family Medicine

## 2023-04-10 VITALS — BP 96/53 | HR 58 | Temp 97.8°F | Ht 69.0 in | Wt 236.0 lb

## 2023-04-10 DIAGNOSIS — E669 Obesity, unspecified: Secondary | ICD-10-CM

## 2023-04-10 DIAGNOSIS — Z794 Long term (current) use of insulin: Secondary | ICD-10-CM | POA: Diagnosis not present

## 2023-04-10 DIAGNOSIS — N184 Chronic kidney disease, stage 4 (severe): Secondary | ICD-10-CM

## 2023-04-10 DIAGNOSIS — Z6834 Body mass index (BMI) 34.0-34.9, adult: Secondary | ICD-10-CM

## 2023-04-10 DIAGNOSIS — I152 Hypertension secondary to endocrine disorders: Secondary | ICD-10-CM

## 2023-04-10 DIAGNOSIS — E1122 Type 2 diabetes mellitus with diabetic chronic kidney disease: Secondary | ICD-10-CM | POA: Diagnosis not present

## 2023-04-10 DIAGNOSIS — E1159 Type 2 diabetes mellitus with other circulatory complications: Secondary | ICD-10-CM

## 2023-04-10 NOTE — Assessment & Plan Note (Signed)
Patient fasting blood sugar very well controlled- most in the 110s; few blood sugars in 130s fasting. Last A1c in March was less than 7 which is goal for his age.  He is taking 10 units of Lantus daily and realistically could stop this but he isn't going to stop this until the New Year.  He does not have any hypoglycemia noted in his blood sugar readings. Follow up on his blood sugars at next appointment after he has stopped his insulin.

## 2023-04-10 NOTE — Assessment & Plan Note (Signed)
Blood pressure borderline low.  Similar reading at cardiology appointment.  Per Dr. Gershon Crane note patient was to decrease amlodipine to 5mg  and amiodarone to 100mg . Patient reports he had already decreased amlodipine to 5mg  and amiodarone to 100mg .  He was encouraged to reach out to Dr. Elberta Fortis for further medication guidance as BP was soft again today.

## 2023-04-10 NOTE — Progress Notes (Signed)
SUBJECTIVE:  Chief Complaint: Obesity  Interim History: Patient got together with family the Saturday before Thanksgiving and then did indulge over the actual holiday.  His friend brought some indulgent food and he did pick at it for a day or two. Patient has realized he cannot always get all the food in and mentions he isn't in the right frame of mind to follow the plan more consistently.  Isn't sure what is causing him to stay at the halfway mark in terms of compliance.  He has decreased the protein shake consumption he had.  He doesn't stay hungry so it is hard to get the quantity of food in.   Mark Singh is here to discuss his progress with his obesity treatment plan. He is on the Category 3 Plan and states he is following his eating plan approximately 50 % of the time. He states he is exercising 120 minutes 5 times per week.   OBJECTIVE: Visit Diagnoses: Problem List Items Addressed This Visit       Cardiovascular and Mediastinum   Hypertension associated with diabetes (HCC)    Blood pressure borderline low.  Similar reading at cardiology appointment.  Per Dr. Gershon Crane note patient was to decrease amlodipine to 5mg  and amiodarone to 100mg . Patient reports he had already decreased amlodipine to 5mg  and amiodarone to 100mg .  He was encouraged to reach out to Dr. Elberta Fortis for further medication guidance as BP was soft again today.        Endocrine   Type 2 diabetes mellitus with stage 4 chronic kidney disease, with long-term current use of insulin (HCC) - Primary    Patient fasting blood sugar very well controlled- most in the 110s; few blood sugars in 130s fasting. Last A1c in March was less than 7 which is goal for his age.  He is taking 10 units of Lantus daily and realistically could stop this but he isn't going to stop this until the New Year.  He does not have any hypoglycemia noted in his blood sugar readings. Follow up on his blood sugars at next appointment after he has stopped his  insulin.        Other   Obesity with starting BMI of 39.2/date 07/06/22   Other Visit Diagnoses     BMI 34.0-34.9,adult           Vitals Temp: 97.8 F (36.6 C) BP: (!) 96/53 Pulse Rate: (!) 58 SpO2: 96 %   Anthropometric Measurements Height: 5\' 9"  (1.753 m) Weight: 236 lb (107 kg) BMI (Calculated): 34.84 Weight at Last Visit: 237 lb Weight Lost Since Last Visit: 1 Weight Gained Since Last Visit: 0 Starting Weight: 265 lb Total Weight Loss (lbs): 29 lb (13.2 kg)   Body Composition  Body Fat %: 35.7 % Fat Mass (lbs): 84.4 lbs Muscle Mass (lbs): 144.6 lbs Total Body Water (lbs): 111.8 lbs Visceral Fat Rating : 23   Other Clinical Data Today's Visit #: 11 Starting Date: 07/06/22     ASSESSMENT AND PLAN:  Diet: Mark Singh is currently in the action stage of change. As such, his goal is to continue with weight loss efforts. He has agreed to Category 3 Plan.  Exercise: Mark Singh has been instructed to work up to a goal of 150 minutes of combined cardio and strengthening exercise per week and to continue exercising as is for weight loss and overall health benefits.   Behavior Modification:  We discussed the following Behavioral Modification Strategies today: increasing lean protein intake, increasing  vegetables, meal planning and cooking strategies, better snacking choices, and holiday eating strategies.  No follow-ups on file.[name] He was informed of the importance of frequent follow up visits to maximize his success with intensive lifestyle modifications for his multiple health conditions.  Attestation Statements:   Reviewed by clinician on day of visit: allergies, medications, problem list, medical history, surgical history, family history, social history, and previous encounter notes.     Mark Likes, MD

## 2023-04-16 ENCOUNTER — Encounter: Payer: Self-pay | Admitting: Cardiology

## 2023-04-29 DIAGNOSIS — J45909 Unspecified asthma, uncomplicated: Secondary | ICD-10-CM | POA: Diagnosis not present

## 2023-04-29 DIAGNOSIS — I10 Essential (primary) hypertension: Secondary | ICD-10-CM | POA: Diagnosis not present

## 2023-04-29 DIAGNOSIS — G4734 Idiopathic sleep related nonobstructive alveolar hypoventilation: Secondary | ICD-10-CM | POA: Diagnosis not present

## 2023-04-29 DIAGNOSIS — G4733 Obstructive sleep apnea (adult) (pediatric): Secondary | ICD-10-CM | POA: Diagnosis not present

## 2023-05-07 ENCOUNTER — Other Ambulatory Visit: Payer: Self-pay | Admitting: Cardiology

## 2023-05-22 ENCOUNTER — Encounter (INDEPENDENT_AMBULATORY_CARE_PROVIDER_SITE_OTHER): Payer: Self-pay | Admitting: Family Medicine

## 2023-05-22 ENCOUNTER — Ambulatory Visit (INDEPENDENT_AMBULATORY_CARE_PROVIDER_SITE_OTHER): Payer: Medicare PPO | Admitting: Family Medicine

## 2023-05-22 VITALS — BP 121/60 | HR 54 | Temp 97.7°F | Ht 69.0 in | Wt 234.0 lb

## 2023-05-22 DIAGNOSIS — Z7985 Long-term (current) use of injectable non-insulin antidiabetic drugs: Secondary | ICD-10-CM

## 2023-05-22 DIAGNOSIS — E669 Obesity, unspecified: Secondary | ICD-10-CM | POA: Diagnosis not present

## 2023-05-22 DIAGNOSIS — E1122 Type 2 diabetes mellitus with diabetic chronic kidney disease: Secondary | ICD-10-CM

## 2023-05-22 DIAGNOSIS — N184 Chronic kidney disease, stage 4 (severe): Secondary | ICD-10-CM | POA: Diagnosis not present

## 2023-05-22 DIAGNOSIS — Z6834 Body mass index (BMI) 34.0-34.9, adult: Secondary | ICD-10-CM

## 2023-05-22 DIAGNOSIS — Z794 Long term (current) use of insulin: Secondary | ICD-10-CM

## 2023-05-22 NOTE — Assessment & Plan Note (Addendum)
Patient has stopped his insulin on December 9th.  Fasting blood sugars 108-141 and pm blood sugars ranging from 98-261.  The highest blood sugar was after eating at the [employer] but patient only had 1 breadstick, and salad, salmon and broccoli. He is eating at home more consistently.  Patient continues to hold insulin and given his blood sugars no need to restart insulin even at starting dose.  Will follow-up on blood sugars at next appointment to ensure blood sugars stay controlled.  Explained once again we should expect an increase in blood sugars after eating.  Will need labs with me or primary care physician within the next 2 to 3 months.

## 2023-05-22 NOTE — Assessment & Plan Note (Addendum)
Patient has been consistent in his food intake and exercise regimen since starting program in May 2024.  He has recognized that he eats out more than he would like to and is unclear as to whether or not he will be consistent in this change in controlling how frequently he eats out as this is a big social aspect for him.  Over the holidays he does recognize he was eating more intelligently.  He was able to consistently workout during this time.  Patient to make a more concentrated effort to get back to cooking at home and eating with in a calorie budget and staying consistent with his exercise regimen within the next 5 to 6 weeks.  Patient was counseled to stop skipping meals and that eating something is more beneficial long-term for his health then going long periods of time without eating.  We discussed meal planning/snacking strategies to ensure patient did not go hours without eating

## 2023-05-22 NOTE — Progress Notes (Signed)
SUBJECTIVE:  Chief Complaint: Obesity  Interim History: Patient had a quiet holiday season and stayed home.  He went to his nieces.  He mentions that this was enjoyable.  Foodwise he was more indulgent than he was previously due to decrease in willpower to stay consistent.  He was exercising during that time.  He has been getting more consistent with his food plan in the last few weeks.  He is eating at home more frequently as well. No upcoming plans or events.  Blood sugars have done well with his insulin cessation.   Mark Singh is here to discuss his progress with his obesity treatment plan. He is on the Category 3 Plan and states he is following his eating plan approximately 25 % of the time. He states he is exercising 120 minutes 5 times per week.   OBJECTIVE: Visit Diagnoses: Problem List Items Addressed This Visit       Endocrine   Type 2 diabetes mellitus with stage 4 chronic kidney disease, with long-term current use of insulin (HCC) - Primary   Patient has stopped his insulin on December 9th.  Fasting blood sugars 108-141 and pm blood sugars ranging from 98-261.  The highest blood sugar was after eating at the [employer] but patient only had 1 breadstick, and salad, salmon and broccoli. He is eating at home more consistently.  Patient continues to hold insulin and given his blood sugars no need to restart insulin even at starting dose.  Will follow-up on blood sugars at next appointment to ensure blood sugars stay controlled.  Explained once again we should expect an increase in blood sugars after eating.  Will need labs with me or primary care physician within the next 2 to 3 months.        Other   Obesity with starting BMI of 39.2/date 07/06/22   Patient has been consistent in his food intake and exercise regimen since starting program in May 2024.  He has recognized that he eats out more than he would like to and is unclear as to whether or not he will be consistent in this change  in controlling how frequently he eats out as this is a big social aspect for him.  Over the holidays he does recognize he was eating more intelligently.  He was able to consistently workout during this time.  Patient to make a more concentrated effort to get back to cooking at home and eating with in a calorie budget and staying consistent with his exercise regimen within the next 5 to 6 weeks.  Patient was counseled to stop skipping meals and that eating something is more beneficial long-term for his health then going long periods of time without eating.  We discussed meal planning/snacking strategies to ensure patient did not go hours without eating       Vitals Temp: 97.7 F (36.5 C) BP: 121/60 Pulse Rate: (!) 54 SpO2: 97 %   Anthropometric Measurements Height: 5\' 9"  (1.753 m) Weight: 234 lb (106.1 kg) BMI (Calculated): 34.54 Weight at Last Visit: 236 lb Weight Lost Since Last Visit: 2 Weight Gained Since Last Visit: 0 Starting Weight: 265 lb Total Weight Loss (lbs): 31 lb (14.1 kg)   Body Composition  Body Fat %: 36 % Fat Mass (lbs): 84.4 lbs Muscle Mass (lbs): 142.6 lbs Total Body Water (lbs): 110.8 lbs Visceral Fat Rating : 23   Other Clinical Data Today's Visit #: 12 Starting Date: 07/06/22     ASSESSMENT AND PLAN:  Diet: Mark Singh is currently in the action stage of change. As such, his goal is to continue with weight loss efforts. He has agreed to Category 3 Plan.  Exercise: Mark Singh has been instructed to try a geriatric exercise plan and to continue exercising as is for weight loss and overall health benefits.   Behavior Modification:  We discussed the following Behavioral Modification Strategies today: increasing lean protein intake, increasing vegetables, meal planning and cooking strategies, keeping healthy foods in the home, avoiding temptations, and planning for success.   No follow-ups on file.[name] He was informed of the importance of frequent follow up  visits to maximize his success with intensive lifestyle modifications for his multiple health conditions.  Attestation Statements:   Reviewed by clinician on day of visit: allergies, medications, problem list, medical history, surgical history, family history, social history, and previous encounter notes.     Reuben Likes, MD

## 2023-05-25 ENCOUNTER — Other Ambulatory Visit: Payer: Self-pay | Admitting: Cardiology

## 2023-05-28 ENCOUNTER — Other Ambulatory Visit: Payer: Self-pay | Admitting: Internal Medicine

## 2023-05-28 DIAGNOSIS — I480 Paroxysmal atrial fibrillation: Secondary | ICD-10-CM

## 2023-05-29 NOTE — Telephone Encounter (Signed)
Prescription refill request for Xarelto received.  Indication:afib Last office visit:12/24 Weight:106.1  kg Age:78 Scr:2.6  10/24 CrCl:35.14  ml/min  Prescription refilled

## 2023-05-30 DIAGNOSIS — I10 Essential (primary) hypertension: Secondary | ICD-10-CM | POA: Diagnosis not present

## 2023-05-30 DIAGNOSIS — G4734 Idiopathic sleep related nonobstructive alveolar hypoventilation: Secondary | ICD-10-CM | POA: Diagnosis not present

## 2023-05-30 DIAGNOSIS — G4733 Obstructive sleep apnea (adult) (pediatric): Secondary | ICD-10-CM | POA: Diagnosis not present

## 2023-05-30 DIAGNOSIS — J45909 Unspecified asthma, uncomplicated: Secondary | ICD-10-CM | POA: Diagnosis not present

## 2023-06-02 DIAGNOSIS — I4819 Other persistent atrial fibrillation: Secondary | ICD-10-CM | POA: Diagnosis not present

## 2023-06-02 DIAGNOSIS — N184 Chronic kidney disease, stage 4 (severe): Secondary | ICD-10-CM | POA: Diagnosis not present

## 2023-06-02 DIAGNOSIS — I130 Hypertensive heart and chronic kidney disease with heart failure and stage 1 through stage 4 chronic kidney disease, or unspecified chronic kidney disease: Secondary | ICD-10-CM | POA: Diagnosis not present

## 2023-06-02 DIAGNOSIS — I5030 Unspecified diastolic (congestive) heart failure: Secondary | ICD-10-CM | POA: Diagnosis not present

## 2023-06-02 DIAGNOSIS — Z6841 Body Mass Index (BMI) 40.0 and over, adult: Secondary | ICD-10-CM | POA: Diagnosis not present

## 2023-06-02 DIAGNOSIS — N041 Nephrotic syndrome with focal and segmental glomerular lesions: Secondary | ICD-10-CM | POA: Diagnosis not present

## 2023-06-02 DIAGNOSIS — E1122 Type 2 diabetes mellitus with diabetic chronic kidney disease: Secondary | ICD-10-CM | POA: Diagnosis not present

## 2023-06-02 DIAGNOSIS — I129 Hypertensive chronic kidney disease with stage 1 through stage 4 chronic kidney disease, or unspecified chronic kidney disease: Secondary | ICD-10-CM | POA: Diagnosis not present

## 2023-06-03 LAB — LAB REPORT - SCANNED: EGFR: 27

## 2023-06-09 NOTE — Progress Notes (Signed)
 Patient ID: Mark Singh MRN: [id number] DOB/AGE: 07-Nov-1945 78 y.o.  Primary Care Physician:Miller, Edwina Gram, MD Primary Cardiologist: Dorothye Gathers, MD  CC: Follow-up for aortic stenosis  FOCUSED CARDIOVASCULAR PROBLEM LIST:   Aortic stenosis AVA 1, MG 19, V-max 3 with SVI 29, EF 55 to 60% TTE 2024 AVA 1.2, MG 19, EF 55 to 60% TTE 2023 Atrial fibrillation On Eliquis PVC ablation 2016; atrial fibrillation ablation August 2019; atrial flutter ablation October 2019 Hypertension Hyperlipidemia CKD stage IV; followed by nephrology (Dr. Irene Mannheim) Type 2 diabetes Not on insulin  BMI 36  HISTORY OF PRESENT ILLNESS: January 2024: The patient is a 78 y.o. male with the indicated medical history here for recommendations regarding his aortic stenosis and tricuspid regurgitation.  He was seen by cardiology in early December due to worsening shortness of breath.  He usually is able to walk a mile however he has been able to do this over the last several weeks.  The patient has noticed increasing fatigue and shortness of breath with walking.  He has had no presyncope or syncope.  He is also noted some increasing peripheral edema.  He denies any orthopnea.  He has had no severe bleeding or bruising episodes while on Xarelto .  He is able to do most of his activities of daily living but he likes to exercise 3-4 times a week and has noticed increasing fatigue and shortness of breath with this activity that he enjoys.  He fortunately has not required any emergency room visits or hospitalizations for breathing issues or volume status.  He is retired for 8 years.  He was in [employer].  He does not smoke.  He sees a Education officer, community on a regular basis and reports good dental health.  Plan: Obtain TAVR protocol CTA and staged coronary angiography if coronary arteries are not able to be assessed on CTA.  5/24: In the interim the patient's nephrologist was contacted regarding the possibility of  obtaining a TAVR protocol CTA.  Given his poor kidney function they thought that this would greatly increase the his risk of progression to requiring renal replacement therapy.  For this reason the CT scan was deferred.  The patient joined an exercise program and feels much better.  He is able to toe walk and do most of his activities of daily living without any issues.  He fortunately has not required any emergency room visits or hospitalizations.  He tells me he feels much better than when he saw me before.  Plan: Follow-up in 9 months.  2/25: The patient returns for routine follow-up.  He was seen by his PCP recently and was doing well.  His blood pressure was well-controlled.  The patient has lost about 30 pounds on Ozempic.  He feels much better.  He no longer needs insulin .  He is able to walk on a treadmill at around 2-1/2 miles an hour without any issues.  If he goes above the speed he will feel short of breath.  He exercises with a trainer and has noticed that his dizziness is much improved.  He denies any shortness of breath at rest, significant paroxysmal nocturnal dyspnea, orthopnea.  He has had no signs or symptoms of stroke and denies any severe bleeding or bruising.  He is otherwise well and without significant cardiovascular complaints today.  Past Medical History:  Diagnosis Date   Arthritis    "hands, feet" (12/13/2017)   Asthma    Atrial fibrillation (HCC)    Bradycardia  a. Repoted h/o HR in the 40s.   Constipation    Edema    a. 2D echo 11/2013: EF 55-60%, LV diastolic function parameters were normal, severely dilated LA, PASP ..   H/O: gout    right foot; on daily RX" (12/13/2017)   Heart valve problem    HTN (hypertension)    Hyperlipidemia    Joint pain    Kidney disease, chronic, stage IV (GFR 15-29 ml/min) (HCC)    Myalgia and myositis, unspecified    Nephrotic syndrome    a. responder to steroid therapy by Dr. Vernia Good.    Obesity    Orthostatic hypotension     Penile cancer (HCC)    "did circ"   Pulmonary hypertension (HCC)    a. Mild pulm HTN felt 2/2 obesity.   PVC's (premature ventricular contractions)    a. Holter 05/2013 showing 18k PVCs (18% of the time).   Seasonal asthma    mild   Shoulder pain    Sleep apnea    SOB (shortness of breath)    Squamous carcinoma    "right hand; left shoulder" (12/13/2017)   Type 2 diabetes mellitus (HCC)     Past Surgical History:  Procedure Laterality Date   A-FLUTTER ABLATION N/A 02/23/2018   Procedure: A-FLUTTER ABLATION;  Surgeon: Lei Pump, MD;  Location: [facility];  Service: Cardiovascular;  Laterality: N/A;   ATRIAL FIBRILLATION ABLATION N/A 12/13/2017   Procedure: ATRIAL FIBRILLATION ABLATION;  Surgeon: Lei Pump, MD;  Location: [facility];  Service: Cardiovascular;  Laterality: N/A;   BUNIONECTOMY WITH HAMMERTOE RECONSTRUCTION Left 1992  &  2008   REMOVAL HEAL SPUR/ BUNIONECTOMY AND HAMMERTOE REPAIR    CARDIOVERSION N/A 07/22/2015   Procedure: CARDIOVERSION;  Surgeon: Liza Riggers, MD;  Location: [facility] ENDOSCOPY;  Service: Cardiovascular;  Laterality: N/A;   CARDIOVERSION N/A 10/06/2015   Procedure: CARDIOVERSION;  Surgeon: Jacqueline Matsu, MD;  Location: [facility] ENDOSCOPY;  Service: Cardiovascular;  Laterality: N/A;   CARDIOVERSION N/A 12/05/2016   Procedure: CARDIOVERSION;  Surgeon: Hazle Lites, MD;  Location: [facility] ENDOSCOPY;  Service: Cardiovascular;  Laterality: N/A;   CARDIOVERSION N/A 06/29/2018   Procedure: CARDIOVERSION;  Surgeon: Lake Pilgrim, MD;  Location: [facility] ENDOSCOPY;  Service: Cardiovascular;  Laterality: N/A;   CARDIOVERSION N/A 09/20/2018   Procedure: CARDIOVERSION;  Surgeon: Maudine Sos, MD;  Location: [facility] ENDOSCOPY;  Service: Cardiovascular;  Laterality: N/A;   CARDIOVERSION N/A 03/24/2021   Procedure: CARDIOVERSION;  Surgeon: Sonny Dust, MD;  Location: [facility] ENDOSCOPY;  Service: Cardiovascular;  Laterality: N/A;    CATARACT EXTRACTION     CIRCUMCISION N/A 11/27/2012   Procedure:  CIRCUMCISION  AND EXCISION OF GLANS PENIS;  Surgeon: Moise Anes, MD;  Location: [facility];  Service: Urology;  Laterality: N/A;   ELECTROPHYSIOLOGIC STUDY N/A 03/30/2015   Procedure: PVC Ablation;  Surgeon: Will Cortland Ding, MD;  Location: [facility];  Service: Cardiovascular;  Laterality: N/A;   HERNIA REPAIR     SQUAMOUS CELL CARCINOMA EXCISION     "right hand; left shoulder" (12/13/2017)   UMBILICAL HERNIA REPAIR  11/04/2005    Family History  Problem Relation Age of Onset   Coronary artery disease Mother    Stroke Father    Hypertension Father    Diabetes Father    CVA Father    Heart disease Father     Social History   Socioeconomic History   Marital status: Widowed  Spouse name: Not on file   Number of children: Not on file   Years of education: Not on file   Highest education level: Not on file  Occupational History   Occupation: Landscarper-Retired  Tobacco Use   Smoking status: Never   Smokeless tobacco: Never   Tobacco comments:    Never smoke 07/15/21  Vaping Use   Vaping status: Never Used  Substance and Sexual Activity   Alcohol use: Never   Drug use: Never   Sexual activity: Not Currently  Other Topics Concern   Not on file  Social History Narrative   Not on file   Social Drivers of Health   Financial Resource Strain: Not on file  Food Insecurity: Not on file  Transportation Needs: Not on file  Physical Activity: Not on file  Stress: Not on file  Social Connections: Not on file  Intimate Partner Violence: Not on file     Prior to Admission medications   Medication Sig Start Date End Date Taking? Authorizing Provider  acetaminophen  (TYLENOL ) 500 MG tablet Take 1,000 mg by mouth every 6 (six) hours as needed for moderate pain.    [provider]  allopurinol (ZYLOPRIM) 100 MG tablet Take 50 mg by mouth every other day.    [provider]  amiodarone  (PACERONE ) 200 MG tablet Take 0.5 tablets (100 mg total) by mouth daily. 03/02/22   Camnitz, Babetta Lesch, MD  amLODipine  (NORVASC ) 10 MG tablet TAKE 1 TABLET BY MOUTH EVERY DAY 08/24/20   Debbie Fails, PA-C  atorvastatin (LIPITOR) 40 MG tablet Take 40 mg by mouth daily.    [provider]  BD PEN NEEDLE NANO 2ND GEN 32G X 4 MM MISC  12/23/19   [provider]  betamethasone  valerate ointment (VALISONE ) 0.1 % 1 application 2 (two) times daily as needed (precancerous spots). 11/25/19   [provider]  Cyanocobalamin  (B-12 PO) Take 1 capsule by mouth daily.    [provider]  Dulaglutide (TRULICITY) 1.5 MG/0.5ML SOPN Inject 1.5 mg into the skin every Wednesday.    [provider]  hydrALAZINE  (APRESOLINE ) 50 MG tablet Take 1 tablet (50 mg total) by mouth 2 (two) times daily. Please contact the office to schedule appointment for additional refills. 03/15/22   Hugh Madura, MD  KLOR-CON  M20 20 MEQ tablet TAKE 1 TABLET BY MOUTH EVERY DAY 05/18/21   Hugh Madura, MD  LANTUS  SOLOSTAR 100 UNIT/ML Solostar Pen Inject 30 Units into the skin at bedtime. 02/08/18   [provider]  losartan  (COZAAR ) 50 MG tablet TAKE 1 TABLET BY MOUTH EVERY DAY 04/01/22   Hugh Madura, MD  omeprazole (PRILOSEC) 20 MG capsule Take 20 mg by mouth daily. 06/25/18   [provider]  OneTouch Delica Lancets 33G MISC USE AS DIRECTED EVERY DAY 12/16/18   [provider]  Emmett Harman VERIO test strip USE AS DIRECTED (TEST DAILY) 90 **E11.9 01/20/19   [provider]  oxymetazoline (AFRIN) 0.05 % nasal spray Place 1 spray into both nostrils 2 (two) times daily as needed for congestion.    [provider]  pimecrolimus (ELIDEL) 1 % cream Apply 1 application topically 2 (two) times daily as needed (psoriasis).     [provider]  spironolactone  (ALDACTONE ) 25 MG tablet Take 0.5 tablets (12.5 mg total) by mouth  daily. 04/18/22   Hugh Madura, MD  tamsulosin  (FLOMAX ) 0.4 MG CAPS capsule Take 0.4 mg by mouth every evening.  03/26/16  [provider]  torsemide  (DEMADEX ) 20 MG tablet TAKE 3 TABLETS BY MOUTH EVERY DAY 05/18/21   Hugh Madura, MD  VITAMIN D  PO Take 1 capsule by mouth daily.    [provider]  Zachery Hermes INHUB 100-50 MCG/DOSE AEPB Inhale 1 puff into the lungs 2 (two) times daily. 09/28/18   [provider]  XARELTO  15 MG TABS tablet TAKE 1 TABLET (15 MG TOTAL) BY MOUTH DAILY WITH SUPPER 11/26/21   Hugh Madura, MD    Allergies  Allergen Reactions   Indomethacin Other (See Comments)    dizziness    REVIEW OF SYSTEMS:  General: no fevers/chills/night sweats Eyes: no blurry vision, diplopia, or amaurosis ENT: no sore throat or hearing loss Resp: no cough, wheezing, or hemoptysis CV: no edema or palpitations GI: no abdominal pain, nausea, vomiting, diarrhea, or constipation GU: no dysuria, frequency, or hematuria Skin: no rash Neuro: no headache, numbness, tingling, or weakness of extremities Musculoskeletal: no joint pain or swelling Heme: no bleeding, DVT, or easy bruising Endo: no polydipsia or polyuria  BP (!) 126/46   Pulse (!) 56   Ht 5\' 9"  (1.753 m)   Wt 242 lb 3.2 oz (109.9 kg)   SpO2 96%   BMI 35.77 kg/m   PHYSICAL EXAM: GEN:  AO x 3 in no acute distress HEENT: normal Dentition: Normal Neck: JVP normal. +2 carotid upstrokes without bruits. No thyromegaly. Lungs: equal expansion, clear bilaterally CV: Apex is discrete and nondisplaced, RRR with very soft systolic ejection murmur Abd: soft, non-tender, non-distended; no bruit; positive bowel sounds Ext: no edema, ecchymoses, or cyanosis Vascular: 2+ femoral pulses, 2+ radial pulses       Skin: warm and dry without rash Neuro: CN II-XII grossly intact; motor and sensory grossly intact    DATA AND STUDIES:  EKG: Sinus bradycardia without bundle blocks  2D ECHO:  May  2024: Formal read pending but aortic valve area looks to be around 1.38 with a mean gradient of 25 mmHg and a peak velocity of 3.29 m/s with a normal ejection fraction.  SVI ~32.9cc/kg/m2  2023 1. Left ventricular ejection fraction, by estimation, is 55 to 60%. The  left ventricle has normal function. The left ventricle has no regional  wall motion abnormalities. Left ventricular diastolic parameters are  consistent with Grade II diastolic  dysfunction (pseudonormalization). The average left ventricular global  longitudinal strain is -23.7 %. The global longitudinal strain is normal.   2. Right ventricular systolic function is normal. The right ventricular  size is moderately enlarged. There is moderately elevated pulmonary artery  systolic pressure. The estimated right ventricular systolic pressure is  46.7 mmHg.   3. Left atrial size was severely dilated.   4. Right atrial size was severely dilated.   5. The mitral valve is normal in structure. Mild mitral valve  regurgitation. No evidence of mitral stenosis.   6. Tricuspid valve regurgitation is moderate to severe.   7. The aortic valve is tricuspid. There is mild calcification of the  aortic valve. There is moderate thickening of the aortic valve. Aortic  valve regurgitation is not visualized. Moderate aortic valve stenosis.   8. The inferior vena cava is dilated in size with >50% respiratory  variability, suggesting right atrial pressure of 8 mmHg.   CARDIAC CATH: n/a  STS RISK CALCULATOR: pending  NHYA CLASS: 2    ASSESSMENT AND PLAN:   Aortic valve stenosis, etiology of cardiac valve disease unspecified - Plan: ECHOCARDIOGRAM COMPLETE  Persistent atrial fibrillation (HCC)  Secondary hypercoagulable state (HCC)  Type 2 diabetes mellitus with complication, without long-term current use of insulin  (HCC)  Hypertension associated with diabetes (HCC)  Hyperlipidemia associated with type 2 diabetes mellitus (HCC) - Plan:  Lipoprotein A (LPA), Lipid panel, Hepatic function panel, Hepatic function panel, Lipid panel, Lipoprotein A (LPA)  CKD (chronic kidney disease) stage 4, GFR 15-29 ml/min (HCC)   Aortic stenosis: The patient continues to do well and seems to modulate his activity level so he is not short of breath.  I had a long discussion with the patient about this.  We have elected to monitor for now.  I will see him back in 6 months with an echocardiogram. Persistent atrial fibrillation: Followed by EP.  Currently on Xarelto  20 mg and amiodarone  100 mg. Secondary hypercoagulable state: Continue Xarelto  20 mg. Type 2 diabetes mellitus: Continue Xarelto  in lieu of aspirin , continue losartan  50 mg, atorvastatin 40 mg. Hypertension: BP well controlled; continue amlodipine  5 mg, losartan  50 mg, and spironolactone  25 mg. Hyperlipidemia: Continue atorvastatin 40 mg daily check lipid panel LFTs and LP(a) today CKD stage IV: Continue losartan  50 mg; will discuss with patient's nephrologist regarding starting Jardiance 10 mg daily for renal protection. Elevated BMI: Continue Ozempic 0.5 qweek.  I spent 33 minutes reviewing all clinical data during and prior to this visit including all relevant imaging studies, laboratories, clinical information from other health systems and prior notes from both Cardiology and other specialties, interviewing the patient, conducting a complete physical examination, and coordinating care in order to formulate a comprehensive and personalized evaluation and treatment plan.   Shilah Hefel K Tremain Rucinski, MD  06/12/2023 11:23 AM    [facility] [address] [place], [place], [place]  [address] Phone: [phone]; Fax: [phone]

## 2023-06-11 DIAGNOSIS — G4733 Obstructive sleep apnea (adult) (pediatric): Secondary | ICD-10-CM | POA: Diagnosis not present

## 2023-06-12 ENCOUNTER — Ambulatory Visit (HOSPITAL_COMMUNITY): Payer: Medicare PPO | Attending: Cardiovascular Disease

## 2023-06-12 ENCOUNTER — Encounter: Payer: Self-pay | Admitting: Internal Medicine

## 2023-06-12 ENCOUNTER — Ambulatory Visit: Payer: Medicare PPO | Admitting: Internal Medicine

## 2023-06-12 VITALS — BP 126/46 | HR 56 | Ht 69.0 in | Wt 242.2 lb

## 2023-06-12 DIAGNOSIS — D6869 Other thrombophilia: Secondary | ICD-10-CM

## 2023-06-12 DIAGNOSIS — Z794 Long term (current) use of insulin: Secondary | ICD-10-CM

## 2023-06-12 DIAGNOSIS — E785 Hyperlipidemia, unspecified: Secondary | ICD-10-CM | POA: Insufficient documentation

## 2023-06-12 DIAGNOSIS — I4819 Other persistent atrial fibrillation: Secondary | ICD-10-CM | POA: Insufficient documentation

## 2023-06-12 DIAGNOSIS — N184 Chronic kidney disease, stage 4 (severe): Secondary | ICD-10-CM | POA: Diagnosis not present

## 2023-06-12 DIAGNOSIS — E1169 Type 2 diabetes mellitus with other specified complication: Secondary | ICD-10-CM

## 2023-06-12 DIAGNOSIS — E1159 Type 2 diabetes mellitus with other circulatory complications: Secondary | ICD-10-CM | POA: Diagnosis not present

## 2023-06-12 DIAGNOSIS — I350 Nonrheumatic aortic (valve) stenosis: Secondary | ICD-10-CM | POA: Diagnosis not present

## 2023-06-12 DIAGNOSIS — E118 Type 2 diabetes mellitus with unspecified complications: Secondary | ICD-10-CM | POA: Insufficient documentation

## 2023-06-12 DIAGNOSIS — I152 Hypertension secondary to endocrine disorders: Secondary | ICD-10-CM

## 2023-06-12 LAB — ECHOCARDIOGRAM COMPLETE
AR max vel: 1.47 cm2
AV Area VTI: 1.34 cm2
AV Area mean vel: 1.38 cm2
AV Mean grad: 25.8 mm[Hg]
AV Peak grad: 48.5 mm[Hg]
Ao pk vel: 3.48 m/s
Area-P 1/2: 3.21 cm2
S' Lateral: 3.1 cm

## 2023-06-12 NOTE — Patient Instructions (Signed)
 Medication Instructions:  No changes *If you need a refill on your cardiac medications before your next appointment, please call your pharmacy*   Lab Work: Today: go to the [facility] for blood work (lipids, liver and Lpa) If you have labs (blood work) drawn today and your tests are completely normal, you will receive your results only by: [employer] (if you have MyChart) OR A paper copy in the mail If you have any lab test that is abnormal or we need to change your treatment, we will call you to review the results.   Testing/Procedures: echo due in 6 mo - same day as office visit Your physician has requested that you have an echocardiogram. Echocardiography is a painless test that uses sound waves to create images of your heart. It provides your doctor with information about the size and shape of your heart and how well your heart's chambers and valves are working. This procedure takes approximately one hour. There are no restrictions for this procedure. Please do NOT wear cologne, perfume, aftershave, or lotions (deodorant is allowed). Please arrive 15 minutes prior to your appointment time.  Please note: We ask at that you not bring children with you during ultrasound (echo/ vascular) testing. Due to room size and safety concerns, children are not allowed in the ultrasound rooms during exams. Our front office staff cannot provide observation of children in our lobby area while testing is being conducted. An adult accompanying a patient to their appointment will only be allowed in the ultrasound room at the discretion of the ultrasound technician under special circumstances. We apologize for any inconvenience.   Follow-Up: At [facility], you and your health needs are our priority.  As part of our continuing mission to provide you with exceptional heart care, we have created designated Provider Care Teams.  These Care Teams include your primary Cardiologist (physician) and  Advanced Practice Providers (APPs -  Physician Assistants and Nurse Practitioners) who all work together to provide you with the care you need, when you need it.   Your next appointment:   6 month(s) (same day as echo)  Provider:   Alyssa Backbone, MD

## 2023-06-13 DIAGNOSIS — Z8549 Personal history of malignant neoplasm of other male genital organs: Secondary | ICD-10-CM | POA: Diagnosis not present

## 2023-06-13 DIAGNOSIS — C609 Malignant neoplasm of penis, unspecified: Secondary | ICD-10-CM | POA: Diagnosis not present

## 2023-06-13 DIAGNOSIS — N480 Leukoplakia of penis: Secondary | ICD-10-CM | POA: Diagnosis not present

## 2023-06-13 LAB — LIPID PANEL
Chol/HDL Ratio: 2.2 {ratio} (ref 0.0–5.0)
Cholesterol, Total: 103 mg/dL (ref 100–199)
HDL: 46 mg/dL (ref >39)
LDL Chol Calc (NIH): 40 mg/dL (ref 0–99)
Triglycerides: 83 mg/dL (ref 0–149)
VLDL Cholesterol Cal: 17 mg/dL (ref 5–40)

## 2023-06-13 LAB — HEPATIC FUNCTION PANEL
ALT: 14 [IU]/L (ref 0–44)
AST: 15 [IU]/L (ref 0–40)
Albumin: 4 g/dL (ref 3.8–4.8)
Alkaline Phosphatase: 82 [IU]/L (ref 44–121)
Bilirubin Total: 0.5 mg/dL (ref 0.0–1.2)
Bilirubin, Direct: 0.22 mg/dL (ref 0.00–0.40)
Total Protein: 6.8 g/dL (ref 6.0–8.5)

## 2023-06-13 LAB — LIPOPROTEIN A (LPA): Lipoprotein (a): 26.3 nmol/L (ref <75.0)

## 2023-06-14 ENCOUNTER — Encounter: Payer: Self-pay | Admitting: Internal Medicine

## 2023-06-30 DIAGNOSIS — J45909 Unspecified asthma, uncomplicated: Secondary | ICD-10-CM | POA: Diagnosis not present

## 2023-06-30 DIAGNOSIS — G4733 Obstructive sleep apnea (adult) (pediatric): Secondary | ICD-10-CM | POA: Diagnosis not present

## 2023-06-30 DIAGNOSIS — I10 Essential (primary) hypertension: Secondary | ICD-10-CM | POA: Diagnosis not present

## 2023-06-30 DIAGNOSIS — G4734 Idiopathic sleep related nonobstructive alveolar hypoventilation: Secondary | ICD-10-CM | POA: Diagnosis not present

## 2023-07-03 ENCOUNTER — Ambulatory Visit (INDEPENDENT_AMBULATORY_CARE_PROVIDER_SITE_OTHER): Payer: Medicare PPO | Admitting: Family Medicine

## 2023-07-03 ENCOUNTER — Encounter (INDEPENDENT_AMBULATORY_CARE_PROVIDER_SITE_OTHER): Payer: Self-pay | Admitting: Family Medicine

## 2023-07-03 VITALS — BP 107/53 | HR 61 | Temp 98.2°F | Ht 69.0 in | Wt 233.0 lb

## 2023-07-03 DIAGNOSIS — E1122 Type 2 diabetes mellitus with diabetic chronic kidney disease: Secondary | ICD-10-CM | POA: Diagnosis not present

## 2023-07-03 DIAGNOSIS — E669 Obesity, unspecified: Secondary | ICD-10-CM | POA: Diagnosis not present

## 2023-07-03 DIAGNOSIS — Z6834 Body mass index (BMI) 34.0-34.9, adult: Secondary | ICD-10-CM | POA: Diagnosis not present

## 2023-07-03 DIAGNOSIS — Z794 Long term (current) use of insulin: Secondary | ICD-10-CM | POA: Diagnosis not present

## 2023-07-03 DIAGNOSIS — N184 Chronic kidney disease, stage 4 (severe): Secondary | ICD-10-CM | POA: Diagnosis not present

## 2023-07-03 NOTE — Progress Notes (Signed)
 SUBJECTIVE:  Chief Complaint: Obesity  Interim History: Patient feels like he was not the most compliant in terms of food intake over the month.  He isn't sure why he was less consistent with is intake.  He didn't get out of the house as much due to weather and thinks that may have impacted him. He is planning to get out and do a bit more work than he did previously.   Mark Singh is here to discuss his progress with his obesity treatment plan. He is on the Category 3 Plan and states he is following his eating plan approximately 50 % of the time. He states he is exercising 120 minutes 5 times per week.   OBJECTIVE: Visit Diagnoses: Problem List Items Addressed This Visit       Endocrine   Type 2 diabetes mellitus with stage 4 chronic kidney disease, with long-term current use of insulin (HCC) - Primary   Fasting blood sugars ranging from 113 to 157.  His PM blood sugar high was 195.  He is doing well on semaglutide- did have trouble getting his dose.  He feels like his current dose is starting to not feel as potent and he can increase his total intake. He is wondering about increasing his dosage.  We discussed possibly getting the 1mg  dose and doing around 0.75mg  dose to see if that dose improved his intake control.  He is asking about jardiance as well.         Other   Obesity with starting BMI of 39.2/date 07/06/22   Anthropometric Measurements Height: 5\' 9"  (1.753 m) Weight: 233 lb (105.7 kg) BMI (Calculated): 34.39 Weight at Last Visit: 234 lb Weight Lost Since Last Visit: 1 Weight Gained Since Last Visit: 0 Starting Weight: 265 lb Total Weight Loss (lbs): 32 lb (14.5 kg) Body Composition  Body Fat %: 35.2 % Fat Mass (lbs): 82.2 lbs Muscle Mass (lbs): 143.8 lbs Total Body Water (lbs): 109.2 lbs Visceral Fat Rating : 23 Other Clinical Data Fasting: no Labs: no Today's Visit #: 13 Starting Date: 07/06/22 Comments: Cat 3       Other Visit Diagnoses       BMI  34.0-34.9,adult           No data recorded       07/03/2023   10:00 AM 06/12/2023   11:00 AM 05/22/2023   12:00 PM  Vitals with BMI  Height 5\' 9"  5\' 9"  5\' 9"   Weight 233 lbs 242 lbs 3 oz 234 lbs  BMI 34.39 35.75 34.54  Systolic 107 126 829  Diastolic 53 46 60  Pulse 61 56 54      ASSESSMENT AND PLAN:  Diet: Mark Singh is currently in the action stage of change. As such, his goal is to continue with weight loss efforts and has agreed to the Category 4 Plan.  We discussed options to increase protein and change food options for breakfast.   Exercise:  Older adults should determine their level of effort for physical activity relative to their level of fitness.  Behavior Modification:  We discussed the following Behavioral Modification Strategies today: increasing lean protein intake, increasing vegetables, decreasing eating out, and meal planning and cooking strategies. We discussed various medication options to help Mark Singh with his weight loss efforts and we both agreed to discuss medication dosages and changes with Dr. Hyacinth Meeker at his next appointment.  Return in about 6 weeks (around 08/14/2023).[name] He was informed of the importance of frequent follow up  visits to maximize his success with intensive lifestyle modifications for his multiple health conditions.  Attestation Statements:   Reviewed by clinician on day of visit: allergies, medications, problem list, medical history, surgical history, family history, social history, and previous encounter notes.     Reuben Likes, MD

## 2023-07-03 NOTE — Assessment & Plan Note (Signed)
 Fasting blood sugars ranging from 113 to 157.  His PM blood sugar high was 195.  He is doing well on semaglutide- did have trouble getting his dose.  He feels like his current dose is starting to not feel as potent and he can increase his total intake. He is wondering about increasing his dosage.  We discussed possibly getting the 1mg  dose and doing around 0.75mg  dose to see if that dose improved his intake control.  He is asking about jardiance as well.

## 2023-07-10 ENCOUNTER — Telehealth: Payer: Self-pay | Admitting: *Deleted

## 2023-07-10 DIAGNOSIS — Z860101 Personal history of adenomatous and serrated colon polyps: Secondary | ICD-10-CM | POA: Diagnosis not present

## 2023-07-10 DIAGNOSIS — I350 Nonrheumatic aortic (valve) stenosis: Secondary | ICD-10-CM | POA: Diagnosis not present

## 2023-07-10 DIAGNOSIS — Z7901 Long term (current) use of anticoagulants: Secondary | ICD-10-CM | POA: Diagnosis not present

## 2023-07-10 DIAGNOSIS — I4891 Unspecified atrial fibrillation: Secondary | ICD-10-CM | POA: Diagnosis not present

## 2023-07-10 NOTE — Telephone Encounter (Signed)
   Pre-operative Risk Assessment    Patient Name: Mark Singh  DOB: [birth year] MRN: [id number]   Date of last office visit: 06/12/2023 Date of next office visit: None  Request for Surgical Clearance    Procedure:   Colonoscopy  Date of Surgery:  Clearance 08/11/23                                 Surgeon:  Dr. Kerin Salen Surgeon's Group or Practice Name:  [facility] Phone number:  [phone] Fax number:  [phone]   Type of Clearance Requested:   - Medical  - Pharmacy:  Hold Rivaroxaban (Xarelto) 3 days prior   Type of Anesthesia:   Propofol   Additional requests/questions:    Signed, Emmit Pomfret   07/10/2023, 2:25 PM

## 2023-07-11 NOTE — Assessment & Plan Note (Signed)
 Anthropometric Measurements Height: 5\' 9"  (1.753 m) Weight: 233 lb (105.7 kg) BMI (Calculated): 34.39 Weight at Last Visit: 234 lb Weight Lost Since Last Visit: 1 Weight Gained Since Last Visit: 0 Starting Weight: 265 lb Total Weight Loss (lbs): 32 lb (14.5 kg) Body Composition  Body Fat %: 35.2 % Fat Mass (lbs): 82.2 lbs Muscle Mass (lbs): 143.8 lbs Total Body Water (lbs): 109.2 lbs Visceral Fat Rating : 23 Other Clinical Data Fasting: no Labs: no Today's Visit #: 13 Starting Date: 07/06/22 Comments: Cat 3

## 2023-07-12 NOTE — Telephone Encounter (Signed)
 Patient with diagnosis of atrial fibrillation on Xarelto for anticoagulation.    Procedure:   Colonoscopy   Date of Surgery:  Clearance 08/11/23     CHA2DS2-VASc Score = 4   This indicates a 4.8% annual risk of stroke. The patient's score is based upon: CHF History: 0 HTN History: 1 Diabetes History: 1 Stroke History: 0 Vascular Disease History: 0 Age Score: 2 Gender Score: 0    CrCl 38 Platelet count 288  Per office protocol, patient can hold Xarelto for 3 days prior to procedure.   Patient will not need bridging with Lovenox (enoxaparin) around procedure.  **This guidance is not considered finalized until pre-operative APP has relayed final recommendations.**

## 2023-07-12 NOTE — Telephone Encounter (Signed)
   Patient Name: DEMITRUS FRANCISCO  DOB: 05/10/1945 MRN: [id number]  Primary Cardiologist: Donato Schultz, MD  Chart reviewed as part of pre-operative protocol coverage. Given past medical history and time since last visit, based on ACC/AHA guidelines, [name] is at acceptable risk for the planned procedure without further cardiovascular testing.   Per office protocol, patient can hold Xarelto for 3 days prior to procedure.   Patient will not need bridging with Lovenox (enoxaparin) around procedure  I will route this recommendation to the requesting party via Epic fax function and remove from pre-op pool.  Please call with questions.  Denyce Robert, NP 07/12/2023, 4:08 PM

## 2023-07-18 DIAGNOSIS — E1122 Type 2 diabetes mellitus with diabetic chronic kidney disease: Secondary | ICD-10-CM | POA: Diagnosis not present

## 2023-07-18 DIAGNOSIS — I129 Hypertensive chronic kidney disease with stage 1 through stage 4 chronic kidney disease, or unspecified chronic kidney disease: Secondary | ICD-10-CM | POA: Diagnosis not present

## 2023-07-18 DIAGNOSIS — Z6835 Body mass index (BMI) 35.0-35.9, adult: Secondary | ICD-10-CM | POA: Diagnosis not present

## 2023-07-18 DIAGNOSIS — I480 Paroxysmal atrial fibrillation: Secondary | ICD-10-CM | POA: Diagnosis not present

## 2023-07-18 DIAGNOSIS — M109 Gout, unspecified: Secondary | ICD-10-CM | POA: Diagnosis not present

## 2023-07-18 DIAGNOSIS — J4530 Mild persistent asthma, uncomplicated: Secondary | ICD-10-CM | POA: Diagnosis not present

## 2023-07-18 DIAGNOSIS — N184 Chronic kidney disease, stage 4 (severe): Secondary | ICD-10-CM | POA: Diagnosis not present

## 2023-07-18 LAB — HEMOGLOBIN A1C: Hemoglobin A1C: 6.8

## 2023-07-28 DIAGNOSIS — G4734 Idiopathic sleep related nonobstructive alveolar hypoventilation: Secondary | ICD-10-CM | POA: Diagnosis not present

## 2023-07-28 DIAGNOSIS — I10 Essential (primary) hypertension: Secondary | ICD-10-CM | POA: Diagnosis not present

## 2023-07-28 DIAGNOSIS — G4733 Obstructive sleep apnea (adult) (pediatric): Secondary | ICD-10-CM | POA: Diagnosis not present

## 2023-07-28 DIAGNOSIS — J45909 Unspecified asthma, uncomplicated: Secondary | ICD-10-CM | POA: Diagnosis not present

## 2023-08-11 DIAGNOSIS — Z860101 Personal history of adenomatous and serrated colon polyps: Secondary | ICD-10-CM | POA: Diagnosis not present

## 2023-08-11 DIAGNOSIS — Z09 Encounter for follow-up examination after completed treatment for conditions other than malignant neoplasm: Secondary | ICD-10-CM | POA: Diagnosis not present

## 2023-08-11 DIAGNOSIS — D122 Benign neoplasm of ascending colon: Secondary | ICD-10-CM | POA: Diagnosis not present

## 2023-08-15 ENCOUNTER — Ambulatory Visit (INDEPENDENT_AMBULATORY_CARE_PROVIDER_SITE_OTHER): Admitting: Family Medicine

## 2023-08-23 DIAGNOSIS — D122 Benign neoplasm of ascending colon: Secondary | ICD-10-CM | POA: Diagnosis not present

## 2023-08-28 ENCOUNTER — Encounter (INDEPENDENT_AMBULATORY_CARE_PROVIDER_SITE_OTHER): Payer: Self-pay | Admitting: Family Medicine

## 2023-08-28 ENCOUNTER — Ambulatory Visit (INDEPENDENT_AMBULATORY_CARE_PROVIDER_SITE_OTHER): Admitting: Family Medicine

## 2023-08-28 VITALS — BP 101/54 | HR 55 | Temp 98.2°F | Ht 69.0 in | Wt 225.0 lb

## 2023-08-28 DIAGNOSIS — E1122 Type 2 diabetes mellitus with diabetic chronic kidney disease: Secondary | ICD-10-CM | POA: Diagnosis not present

## 2023-08-28 DIAGNOSIS — G4733 Obstructive sleep apnea (adult) (pediatric): Secondary | ICD-10-CM | POA: Diagnosis not present

## 2023-08-28 DIAGNOSIS — I152 Hypertension secondary to endocrine disorders: Secondary | ICD-10-CM

## 2023-08-28 DIAGNOSIS — Z7985 Long-term (current) use of injectable non-insulin antidiabetic drugs: Secondary | ICD-10-CM | POA: Diagnosis not present

## 2023-08-28 DIAGNOSIS — E1159 Type 2 diabetes mellitus with other circulatory complications: Secondary | ICD-10-CM

## 2023-08-28 DIAGNOSIS — J45909 Unspecified asthma, uncomplicated: Secondary | ICD-10-CM | POA: Diagnosis not present

## 2023-08-28 DIAGNOSIS — Z794 Long term (current) use of insulin: Secondary | ICD-10-CM

## 2023-08-28 DIAGNOSIS — N184 Chronic kidney disease, stage 4 (severe): Secondary | ICD-10-CM

## 2023-08-28 DIAGNOSIS — G4734 Idiopathic sleep related nonobstructive alveolar hypoventilation: Secondary | ICD-10-CM | POA: Diagnosis not present

## 2023-08-28 DIAGNOSIS — E669 Obesity, unspecified: Secondary | ICD-10-CM | POA: Diagnosis not present

## 2023-08-28 DIAGNOSIS — Z6833 Body mass index (BMI) 33.0-33.9, adult: Secondary | ICD-10-CM

## 2023-08-28 DIAGNOSIS — I10 Essential (primary) hypertension: Secondary | ICD-10-CM | POA: Diagnosis not present

## 2023-08-28 NOTE — Assessment & Plan Note (Signed)
 Patient's BP lower end of normal today.  He isn't experiencing any dizziness or lightheadedness today.  No chest pain, chest pressure or headache.

## 2023-08-28 NOTE — Progress Notes (Unsigned)
   SUBJECTIVE:  Chief Complaint: Obesity  Interim History: Patient has been doing much of the same except increased his activity outdoors with more walking and having to do some yardwork.  He feels following the plan is difficult- some days he just can't follow it.  The days he isn't following the plan he falls into old habits of eating sweets and snacks and drinking sugar sweetened beverages.   Mark Singh is here to discuss his progress with his obesity treatment plan. He is on the Category 3 Plan and states he is following his eating plan approximately 50 % of the time. He states he is exercising 120 minutes 5 times per week.   OBJECTIVE: Visit Diagnoses: Problem List Items Addressed This Visit       Cardiovascular and Mediastinum   Hypertension associated with diabetes (HCC)   Patient's BP lower end of normal today.  He isn't experiencing any dizziness or lightheadedness today.  No chest pain, chest pressure or headache.          Endocrine   Type 2 diabetes mellitus with stage 4 chronic kidney disease, with long-term current use of insulin  (HCC) - Primary   Patient is doing well monitoring his blood sugar.  No hypoglycemic episodes.  He is off all insulin .  Feeling better than he has in a long time. On ozempic weekly with no GI side effects.  Continue current meal plan and blood sugar monitoring.          Other   Obesity with starting BMI of 39.2/date 07/06/22   Other Visit Diagnoses       BMI 33.0-33.9,adult           Vitals Temp: 98.2 F (36.8 C) BP: (!) 101/54 Pulse Rate: (!) 55 SpO2: 94 %   Anthropometric Measurements Height: 5\' 9"  (1.753 m) Weight: 225 lb (102.1 kg) BMI (Calculated): 33.21 Weight at Last Visit: 233 lb Weight Lost Since Last Visit: 8 Weight Gained Since Last Visit: 0 Starting Weight: 265 lb Total Weight Loss (lbs): 40 lb (18.1 kg)   Body Composition  Body Fat %: 33.7 % Fat Mass (lbs): 75.8 lbs Muscle Mass (lbs): 141.8 lbs Total Body Water  (lbs): 102.6 lbs Visceral Fat Rating : 22   Other Clinical Data Fasting: no Labs: no Today's Visit #: 14 Starting Date: 07/06/22 Comments: Cat 3     ASSESSMENT AND PLAN:  Diet: Mark Singh is currently in the action stage of change. As such, his goal is to continue with weight loss efforts and has agreed to {MWMwtlossportion/plan2:23431}.   Exercise:  {MWM Exercise Recommendations:210964029}  Behavior Modification:  We discussed the following Behavioral Modification Strategies today: {HWW Behavior Modification:210964008}. We discussed various medication options to help Mark Singh with his weight loss efforts and we both agreed to ***.  Return in about 8 weeks (around 10/23/2023).Mark Singh He was informed of the importance of frequent follow up visits to maximize his success with intensive lifestyle modifications for his multiple health conditions.  Attestation Statements:   Reviewed by clinician on day of visit: allergies, medications, problem list, medical history, surgical history, family history, social history, and previous encounter notes.   Time spent on visit including pre-visit chart review and post-visit care and charting was *** minutes  Donaciano Frizzle, MD

## 2023-08-28 NOTE — Assessment & Plan Note (Signed)
 Patient is doing well monitoring his blood sugar.  No hypoglycemic episodes.  He is off all insulin .  Feeling better than he has in a long time. On ozempic weekly with no GI side effects.  Continue current meal plan and blood sugar monitoring.

## 2023-08-30 DIAGNOSIS — I5030 Unspecified diastolic (congestive) heart failure: Secondary | ICD-10-CM | POA: Diagnosis not present

## 2023-08-30 DIAGNOSIS — N041 Nephrotic syndrome with focal and segmental glomerular lesions: Secondary | ICD-10-CM | POA: Diagnosis not present

## 2023-08-30 DIAGNOSIS — Z6841 Body Mass Index (BMI) 40.0 and over, adult: Secondary | ICD-10-CM | POA: Diagnosis not present

## 2023-08-30 DIAGNOSIS — I10 Essential (primary) hypertension: Secondary | ICD-10-CM | POA: Diagnosis not present

## 2023-08-30 DIAGNOSIS — E1122 Type 2 diabetes mellitus with diabetic chronic kidney disease: Secondary | ICD-10-CM | POA: Diagnosis not present

## 2023-08-30 DIAGNOSIS — I4819 Other persistent atrial fibrillation: Secondary | ICD-10-CM | POA: Diagnosis not present

## 2023-08-30 DIAGNOSIS — N184 Chronic kidney disease, stage 4 (severe): Secondary | ICD-10-CM | POA: Diagnosis not present

## 2023-08-31 LAB — LAB REPORT - SCANNED
Creatinine, POC: 52.7 mg/dL
EGFR: 21

## 2023-09-11 DIAGNOSIS — G4734 Idiopathic sleep related nonobstructive alveolar hypoventilation: Secondary | ICD-10-CM | POA: Diagnosis not present

## 2023-09-11 DIAGNOSIS — J45909 Unspecified asthma, uncomplicated: Secondary | ICD-10-CM | POA: Diagnosis not present

## 2023-09-11 DIAGNOSIS — G4733 Obstructive sleep apnea (adult) (pediatric): Secondary | ICD-10-CM | POA: Diagnosis not present

## 2023-09-11 DIAGNOSIS — I10 Essential (primary) hypertension: Secondary | ICD-10-CM | POA: Diagnosis not present

## 2023-09-21 DIAGNOSIS — M25561 Pain in right knee: Secondary | ICD-10-CM | POA: Diagnosis not present

## 2023-09-27 ENCOUNTER — Ambulatory Visit: Admitting: Podiatry

## 2023-09-27 ENCOUNTER — Encounter: Payer: Self-pay | Admitting: Podiatry

## 2023-09-27 ENCOUNTER — Ambulatory Visit (INDEPENDENT_AMBULATORY_CARE_PROVIDER_SITE_OTHER)

## 2023-09-27 DIAGNOSIS — M79672 Pain in left foot: Secondary | ICD-10-CM

## 2023-09-27 DIAGNOSIS — Z961 Presence of intraocular lens: Secondary | ICD-10-CM | POA: Diagnosis not present

## 2023-09-27 DIAGNOSIS — H52203 Unspecified astigmatism, bilateral: Secondary | ICD-10-CM | POA: Diagnosis not present

## 2023-09-27 DIAGNOSIS — H524 Presbyopia: Secondary | ICD-10-CM | POA: Diagnosis not present

## 2023-09-27 DIAGNOSIS — M7752 Other enthesopathy of left foot: Secondary | ICD-10-CM | POA: Diagnosis not present

## 2023-09-27 DIAGNOSIS — G8929 Other chronic pain: Secondary | ICD-10-CM

## 2023-09-27 DIAGNOSIS — I10 Essential (primary) hypertension: Secondary | ICD-10-CM | POA: Diagnosis not present

## 2023-09-27 DIAGNOSIS — E119 Type 2 diabetes mellitus without complications: Secondary | ICD-10-CM | POA: Diagnosis not present

## 2023-09-27 DIAGNOSIS — H26493 Other secondary cataract, bilateral: Secondary | ICD-10-CM | POA: Diagnosis not present

## 2023-09-27 DIAGNOSIS — G4734 Idiopathic sleep related nonobstructive alveolar hypoventilation: Secondary | ICD-10-CM | POA: Diagnosis not present

## 2023-09-27 DIAGNOSIS — G4733 Obstructive sleep apnea (adult) (pediatric): Secondary | ICD-10-CM | POA: Diagnosis not present

## 2023-09-27 DIAGNOSIS — J45909 Unspecified asthma, uncomplicated: Secondary | ICD-10-CM | POA: Diagnosis not present

## 2023-09-27 MED ORDER — TRIAMCINOLONE ACETONIDE 10 MG/ML IJ SUSP
10.0000 mg | Freq: Once | INTRAMUSCULAR | Status: AC
  Administered 2023-09-27: 10 mg via INTRA_ARTICULAR

## 2023-09-27 NOTE — Progress Notes (Signed)
 Subjective:   Patient ID: Mark Singh, male   DOB: 78 y.o.   MRN: [id number]   HPI Patient presents with a lot of pain in the outside of his left foot and states he feels like he is walking on fluid.  Points to the plantar aspect left foot patient does not smoke likes to be active   Review of Systems  All other systems reviewed and are negative.       Objective:  Physical Exam Vitals and nursing note reviewed.  Constitutional:      Appearance: He is well-developed.  Pulmonary:     Effort: Pulmonary effort is normal.  Musculoskeletal:        General: Normal range of motion.  Skin:    General: Skin is warm.  Neurological:     Mental Status: He is alert.     Neurovascular status was found to be intact muscle strength was found to be adequate range of motion adequate exquisite discomfort plantar lateral aspect left foot around the fifth metatarsal base fluid buildup and sore with palpation.  Good digital perfusion well-oriented x 3     Assessment:  Inflammatory capsulitis base of fifth metatarsal left fluid buildup and does have a lesion around the same area     Plan:  H&P education rendered concerning condition also discussed heel pain.  I did sterile prep injected the plantar capsule fifth metatarsal base 3 mg Dexasone Kenalog  5 mg Xylocaine  2 sharp debridement of lesion no iatrogenic bleeding and discussed other treatments if symptoms persist  X-rays indicate small spur no indications of pathology for bony structural perspective base of fifth metatarsal

## 2023-10-03 ENCOUNTER — Ambulatory Visit (HOSPITAL_COMMUNITY)
Admission: RE | Admit: 2023-10-03 | Discharge: 2023-10-03 | Disposition: A | Discharge status: Home / Self Care | Payer: Medicare PPO | Source: Ambulatory Visit | Attending: Internal Medicine | Admitting: Internal Medicine

## 2023-10-03 VITALS — BP 112/42 | HR 54 | Ht 69.0 in | Wt 237.0 lb

## 2023-10-03 DIAGNOSIS — I4819 Other persistent atrial fibrillation: Secondary | ICD-10-CM | POA: Diagnosis not present

## 2023-10-03 DIAGNOSIS — Z5181 Encounter for therapeutic drug level monitoring: Secondary | ICD-10-CM | POA: Diagnosis not present

## 2023-10-03 DIAGNOSIS — I4891 Unspecified atrial fibrillation: Secondary | ICD-10-CM

## 2023-10-03 DIAGNOSIS — D6869 Other thrombophilia: Secondary | ICD-10-CM | POA: Diagnosis not present

## 2023-10-03 DIAGNOSIS — Z79899 Other long term (current) drug therapy: Secondary | ICD-10-CM

## 2023-10-03 MED ORDER — LOSARTAN POTASSIUM 25 MG PO TABS: 25.0000 mg | ORAL_TABLET | Freq: Every day | ORAL | 3 refills | Status: AC

## 2023-10-03 MED ORDER — LOSARTAN POTASSIUM 50 MG PO TABS: 25.0000 mg | ORAL_TABLET | Freq: Every day | ORAL | 3 refills | Status: DC

## 2023-10-03 NOTE — Progress Notes (Signed)
 Primary Care Physician: Perley Bradley, MD Primary Cardiologist: Dr Renna Cary Primary Electrophysiologist: Dr Lawana Pray Referring Physician: Dr Hulen Mages is a 78 y.o. male with a history of persistent atrial fibrillation who presents for follow up in the [facility]. S/p DCCV 09/20/18. He reports that he had some bradycardia and dizziness. His metoprolol  was decreased. On 09/28/18 he was at his PCP office and he reports fluid in his abdomen and legs with increased SOB. Started on Lasix  with symptomatic relief. He is on Xarelto  for a CHADS2VASC score of 4. Patient is s/p DCCV 03/24/21.   On follow up 10/03/23, he is here for amiodarone  surveillance. He is currently in NSR. He has had no episodes of Afib since last office visit with Dr. Lawana Pray in December. He thinks he is tired from a low blood pressure all the time. No missed doses of Xarelto .   Today, he denies symptoms of palpitations, chest pain, orthopnea, PND, presyncope, syncope, bleeding, or neurologic sequela. The patient is tolerating medications without difficulties and is otherwise without complaint today.    Atrial Fibrillation Risk Factors:  he does not have symptoms or diagnosis of sleep apnea. he does not have a history of rheumatic fever. he does not have a history of alcohol use.  he has a BMI of Body mass index is 35 kg/m.Aaron Aas Filed Weights   10/03/23 0834  Weight: 107.5 kg     Family History  Problem Relation Age of Onset   Coronary artery disease Mother    Stroke Father    Hypertension Father    Diabetes Father    CVA Father    Heart disease Father      Atrial Fibrillation Management history:  Previous antiarrhythmic drugs: flecainide , amiodarone   Previous cardioversion: several, most recently 09/20/18, 03/24/21 Previous ablations: 11/2017, 01/2018 CHADS2VASC score: 4 Anticoagulation history: Xarelto    Past Medical History:  Diagnosis Date   Arthritis     "hands, feet" (12/13/2017)   Asthma    Atrial fibrillation (HCC)    Bradycardia    a. Repoted h/o HR in the 40s.   Constipation    Edema    a. 2D echo 11/2013: EF 55-60%, LV diastolic function parameters were normal, severely dilated LA, PASP ..   H/O: gout    right foot; on daily RX" (12/13/2017)   Heart valve problem    HTN (hypertension)    Hyperlipidemia    Joint pain    Kidney disease, chronic, stage IV (GFR 15-29 ml/min) (HCC)    Myalgia and myositis, unspecified    Nephrotic syndrome    a. responder to steroid therapy by Dr. Vernia Good.    Obesity    Orthostatic hypotension    Penile cancer (HCC)    "did circ"   Pulmonary hypertension (HCC)    a. Mild pulm HTN felt 2/2 obesity.   PVC's (premature ventricular contractions)    a. Holter 05/2013 showing 18k PVCs (18% of the time).   Seasonal asthma    mild   Shoulder pain    Sleep apnea    SOB (shortness of breath)    Squamous carcinoma    "right hand; left shoulder" (12/13/2017)   Type 2 diabetes mellitus (HCC)    Past Surgical History:  Procedure Laterality Date   A-FLUTTER ABLATION N/A 02/23/2018   Procedure: A-FLUTTER ABLATION;  Surgeon: Lei Pump, MD;  Location: [facility];  Service: Cardiovascular;  Laterality: N/A;   ATRIAL FIBRILLATION ABLATION  N/A 12/13/2017   Procedure: ATRIAL FIBRILLATION ABLATION;  Surgeon: Lei Pump, MD;  Location: [facility];  Service: Cardiovascular;  Laterality: N/A;   BUNIONECTOMY WITH HAMMERTOE RECONSTRUCTION Left 1992  &  2008   REMOVAL HEAL SPUR/ BUNIONECTOMY AND HAMMERTOE REPAIR    CARDIOVERSION N/A 07/22/2015   Procedure: CARDIOVERSION;  Surgeon: Liza Riggers, MD;  Location: [facility] ENDOSCOPY;  Service: Cardiovascular;  Laterality: N/A;   CARDIOVERSION N/A 10/06/2015   Procedure: CARDIOVERSION;  Surgeon: Jacqueline Matsu, MD;  Location: [facility] ENDOSCOPY;  Service: Cardiovascular;  Laterality: N/A;   CARDIOVERSION N/A 12/05/2016   Procedure:  CARDIOVERSION;  Surgeon: Hazle Lites, MD;  Location: [facility] ENDOSCOPY;  Service: Cardiovascular;  Laterality: N/A;   CARDIOVERSION N/A 06/29/2018   Procedure: CARDIOVERSION;  Surgeon: Lake Pilgrim, MD;  Location: [facility] ENDOSCOPY;  Service: Cardiovascular;  Laterality: N/A;   CARDIOVERSION N/A 09/20/2018   Procedure: CARDIOVERSION;  Surgeon: Maudine Sos, MD;  Location: [facility] ENDOSCOPY;  Service: Cardiovascular;  Laterality: N/A;   CARDIOVERSION N/A 03/24/2021   Procedure: CARDIOVERSION;  Surgeon: Sonny Dust, MD;  Location: [facility] ENDOSCOPY;  Service: Cardiovascular;  Laterality: N/A;   CATARACT EXTRACTION     CIRCUMCISION N/A 11/27/2012   Procedure:  CIRCUMCISION  AND EXCISION OF GLANS PENIS;  Surgeon: Moise Anes, MD;  Location: [facility];  Service: Urology;  Laterality: N/A;   ELECTROPHYSIOLOGIC STUDY N/A 03/30/2015   Procedure: PVC Ablation;  Surgeon: Will Cortland Ding, MD;  Location: [facility];  Service: Cardiovascular;  Laterality: N/A;   HERNIA REPAIR     SQUAMOUS CELL CARCINOMA EXCISION     "right hand; left shoulder" (12/13/2017)   UMBILICAL HERNIA REPAIR  11/04/2005    Current Outpatient Medications  Medication Sig Dispense Refill   acetaminophen  (TYLENOL ) 500 MG tablet Take 500 mg by mouth as needed for moderate pain (pain score 4-6).     allopurinol (ZYLOPRIM) 100 MG tablet Take 50 mg by mouth daily.     amiodarone  (PACERONE ) 200 MG tablet Take 0.5 tablets (100 mg total) by mouth daily. 45 tablet 3   amLODipine  (NORVASC ) 5 MG tablet Take 1 tablet (5 mg total) by mouth daily. 90 tablet 3   atorvastatin (LIPITOR) 40 MG tablet Take 40 mg by mouth daily.     BD PEN NEEDLE NANO 2ND GEN 32G X 4 MM MISC      betamethasone  valerate ointment (VALISONE ) 0.1 % 1 application 2 (two) times daily as needed (precancerous spots).     hydrALAZINE  (APRESOLINE ) 50 MG tablet Take 1 tablet (50 mg total) by mouth 2 (two) times daily. 180 tablet 3    KLOR-CON  M20 20 MEQ tablet TAKE 1 TABLET BY MOUTH EVERY DAY 90 tablet 3   OneTouch Delica Lancets 33G MISC USE AS DIRECTED EVERY DAY     ONETOUCH VERIO test strip USE AS DIRECTED (TEST DAILY) 90 **E11.9     OZEMPIC, 1 MG/DOSE, 4 MG/3ML SOPN Inject 1 mg into the skin once a week.     pimecrolimus (ELIDEL) 1 % cream Apply 1 application topically 2 (two) times daily as needed (psoriasis).      Polyethylene Glycol 3350 (MIRALAX PO) Takes 1 cap full by mouth daily- pours in his orange juice     spironolactone  (ALDACTONE ) 25 MG tablet TAKE 1/2 TABLET BY MOUTH EVERY DAY 45 tablet 3   tamsulosin  (FLOMAX ) 0.4 MG CAPS capsule Take 0.4 mg by mouth every evening.   4   torsemide  (  DEMADEX ) 20 MG tablet TAKE 3 TABLETS BY MOUTH EVERY DAY 270 tablet 3   WIXELA INHUB 100-50 MCG/DOSE AEPB Inhale 1 puff into the lungs 2 (two) times daily.     XARELTO  15 MG TABS tablet TAKE 1 TABLET (15 MG TOTAL) BY MOUTH DAILY WITH SUPPER 90 tablet 1   losartan  (COZAAR ) 50 MG tablet Take 0.5 tablets (25 mg total) by mouth daily. 90 tablet 3   No current facility-administered medications for this encounter.    Allergies  Allergen Reactions   Indomethacin Other (See Comments)    dizziness    ROS- All systems are reviewed and negative except as per the HPI above.  Physical Exam: Vitals:   10/03/23 0834  BP: (!) 112/42  Pulse: (!) 54  Weight: 107.5 kg  Height: 5\' 9"  (1.753 m)    GEN- The patient is well appearing, alert and oriented x 3 today.   Neck - no JVD or carotid bruit noted Lungs- Clear to ausculation bilaterally, normal work of breathing Heart- Regular bradycardic rate and rhythm, no murmurs, rubs or gallops, PMI not laterally displaced Extremities- no clubbing, cyanosis, or edema Skin - no rash or ecchymosis noted  Wt Readings from Last 3 Encounters:  10/03/23 107.5 kg  08/28/23 102.1 kg  07/03/23 105.7 kg    EKG today demonstrates  Vent. rate 54 BPM PR interval 256 ms QRS duration 98 ms QT/QTcB  474/449 ms P-R-T axes 72 40 43 Sinus bradycardia with 1st degree A-V block with Premature atrial complexes Otherwise normal ECG When compared with ECG of 04-Apr-2023 09:53, Sinus rhythm has replaced Junctional rhythm  Echo 06/18/19 demonstrated   1. Left ventricular ejection fraction, by estimation, is 60 to 65%. The  left ventricle has normal function. The left ventricle has no regional  wall motion abnormalities. The left ventricular internal cavity size was mildly dilated. There is moderate concentric left ventricular hypertrophy. Left ventricular diastolic parameters are indeterminate.   2. Right ventricular systolic function is normal. The right ventricular  size is severely enlarged. There is mildly elevated pulmonary artery  systolic pressure. The estimated right ventricular systolic pressure is  38.2 mmHg.   3. Left atrial size was moderately dilated.   4. Right atrial size was moderately dilated.   5. The mitral valve is normal in structure and function. Trivial mitral  valve regurgitation. No evidence of mitral stenosis.   6. Tricuspid valve regurgitation is mild to moderate.   7. The aortic valve is tricuspid. Aortic valve regurgitation is not  visualized. Mild aortic valve sclerosis is present, with no evidence of  aortic valve stenosis.   8. The inferior vena cava is dilated in size with <50% respiratory  variability, suggesting right atrial pressure of 15 mmHg.   Epic records are reviewed at length today  CHA2DS2-VASc Score = 4  The patient's score is based upon: CHF History: 0 HTN History: 1 Diabetes History: 1 Stroke History: 0 Vascular Disease History: 0 Age Score: 2 Gender Score: 0       ASSESSMENT AND PLAN: 1. Persistent Atrial Fibrillation/atrial flutter The patient's CHA2DS2-VASc score is 4, indicating a 4.8% annual risk of stroke.    He is currently in NSR. Decrease losartan  to 25 mg daily and trend BP at home.  High risk medication monitoring  (ICD10: [id number]) Patient requires ongoing monitoring for anti-arrhythmic medication which has the potential to cause life threatening arrhythmias or AV block. Qtc stable. Continue amiodarone  100 mg daily. TSH drawn today. Hepatic panel on  06/12/23 stable.  2. Secondary Hypercoagulable State (ICD10:  D68.69) The patient is at significant risk for stroke/thromboembolism based upon his CHA2DS2-VASc Score of 4.  Continue Rivaroxaban  (Xarelto ).  No missed doses.   3. Obesity Body mass index is 35 kg/m. Recommended daily walking as tolerated.  4. HTN Decrease losartan  to 25 mg daily and trend BP at home.  5. OSA Followed by [facility].    6. Chronic diastolic CHF Patient is on torsemide  60 mg daily.   Follow up 6 months with Dr. Lawana Pray, 1 year Afib clinic.    Woody Heading, PA-C [facility] [address] [place], [place] [address] [phone] 10/03/2023 10:57 AM

## 2023-10-03 NOTE — Patient Instructions (Signed)
 Decrease Losartan  to 25 mg  daily   Follow up with Dr. Lawana Pray in 6 months

## 2023-10-03 NOTE — Addendum Note (Signed)
 Encounter addended by: Missouri Amor, RN on: 10/03/2023 12:17 PM  Actions taken: Pharmacy for encounter modified, Order list changed

## 2023-10-04 ENCOUNTER — Ambulatory Visit (HOSPITAL_COMMUNITY): Payer: Self-pay | Admitting: Internal Medicine

## 2023-10-04 LAB — TSH: TSH: 6.92 u[IU]/mL — ABNORMAL HIGH (ref 0.450–4.500)

## 2023-10-17 DIAGNOSIS — M1712 Unilateral primary osteoarthritis, left knee: Secondary | ICD-10-CM | POA: Diagnosis not present

## 2023-10-23 DIAGNOSIS — M25561 Pain in right knee: Secondary | ICD-10-CM | POA: Diagnosis not present

## 2023-10-30 ENCOUNTER — Encounter (INDEPENDENT_AMBULATORY_CARE_PROVIDER_SITE_OTHER): Payer: Self-pay | Admitting: Family Medicine

## 2023-10-30 ENCOUNTER — Ambulatory Visit (INDEPENDENT_AMBULATORY_CARE_PROVIDER_SITE_OTHER): Admitting: Family Medicine

## 2023-10-30 VITALS — BP 108/53 | HR 72 | Temp 98.2°F | Ht 69.0 in | Wt 225.0 lb

## 2023-10-30 DIAGNOSIS — Z6833 Body mass index (BMI) 33.0-33.9, adult: Secondary | ICD-10-CM | POA: Diagnosis not present

## 2023-10-30 DIAGNOSIS — E669 Obesity, unspecified: Secondary | ICD-10-CM | POA: Diagnosis not present

## 2023-10-30 DIAGNOSIS — I152 Hypertension secondary to endocrine disorders: Secondary | ICD-10-CM

## 2023-10-30 DIAGNOSIS — Z794 Long term (current) use of insulin: Secondary | ICD-10-CM

## 2023-10-30 DIAGNOSIS — E1159 Type 2 diabetes mellitus with other circulatory complications: Secondary | ICD-10-CM | POA: Diagnosis not present

## 2023-10-30 DIAGNOSIS — Z7985 Long-term (current) use of injectable non-insulin antidiabetic drugs: Secondary | ICD-10-CM

## 2023-10-30 DIAGNOSIS — E1122 Type 2 diabetes mellitus with diabetic chronic kidney disease: Secondary | ICD-10-CM

## 2023-10-30 DIAGNOSIS — N184 Chronic kidney disease, stage 4 (severe): Secondary | ICD-10-CM

## 2023-10-30 NOTE — Progress Notes (Signed)
 SUBJECTIVE:  Chief Complaint: Obesity  Interim History: Over the course of the last two months patient has struggled with consistency on meal plan.  Mentions he just couldn't stay focused on eating on plan.  He ate off plan and snacked more consistently.  He finally decided last week he had to stop eating off plan.  In the next 6 weeks patient does not have much planned short of staying on his meal plan and exercising.   Mark Singh is here to discuss his progress with his obesity treatment plan. He is on the Category 3 Plan and states he is following his eating plan approximately 50 % of the time. He states he is exercising 120 minutes 5 times per week.   OBJECTIVE: Visit Diagnoses: Problem List Items Addressed This Visit       Cardiovascular and Mediastinum   Hypertension associated with diabetes (HCC) - Primary   Recent decrease in losartan  due to low BP at a fib clinic.  No dizziness or lightheadedness.  Has appointment with cardiology in August.  Need to keep an eye on BP and may need to have patient reach out to cardiology if BP lowers again.        Endocrine   Type 2 diabetes mellitus with stage 4 chronic kidney disease, with long-term current use of insulin  (HCC)   Patient brought blood sugar log with him today- he is measuring and logging am and pm blood sugars.  AM sugars are ranging between 117-159.  PM blood sugars are 98-305.  PM blood sugars averaging at 130s.  Patient to continue to monitor sugars and will bring log to next appointment.        Other   Obesity with starting BMI of 39.2/date 07/06/22   Other Visit Diagnoses       BMI 33.0-33.9,adult           Vitals Temp: 98.2 F (36.8 C) BP: (!) 108/53 Pulse Rate: 72 SpO2: 100 %   Anthropometric Measurements Height: 5' 9 (1.753 m) Weight: 225 lb (102.1 kg) BMI (Calculated): 33.21 Weight at Last Visit: 225lb Weight Lost Since Last Visit: 0 Weight Gained Since Last Visit: 0 Starting Weight: 265lb Total  Weight Loss (lbs): 40 lb (18.1 kg)   Body Composition  Body Fat %: 34.2 % Fat Mass (lbs): 77 lbs Muscle Mass (lbs): 141 lbs Total Body Water (lbs): 102.8 lbs Visceral Fat Rating : 22   Other Clinical Data Fasting: no Labs: no Today's Visit #: 15 Starting Date: 07/06/22     ASSESSMENT AND PLAN:  Diet: Mark Singh is currently in the action stage of change. As such, his goal is to continue with weight loss efforts and has agreed to the Category 3 Plan and the Pescatarian Plan + 300 calories. Individually increased meal options on Pescatarian to increase calories.   Exercise:  Older adults should determine their level of effort for physical activity relative to their level of fitness.  Behavior Modification:  We discussed the following Behavioral Modification Strategies today: increasing lean protein intake, decreasing simple carbohydrates, increasing vegetables, meal planning and cooking strategies, and planning for success.   Return in about 4 weeks (around 11/27/2023).   He was informed of the importance of frequent follow up visits to maximize his success with intensive lifestyle modifications for his multiple health conditions.  Attestation Statements:   Reviewed by clinician on day of visit: allergies, medications, problem list, medical history, surgical history, family history, social history, and previous encounter notes.  Adelita Cho, MD

## 2023-10-30 NOTE — Assessment & Plan Note (Signed)
 Recent decrease in losartan  due to low BP at a fib clinic.  No dizziness or lightheadedness.  Has appointment with cardiology in August.  Need to keep an eye on BP and may need to have patient reach out to cardiology if BP lowers again.

## 2023-10-30 NOTE — Assessment & Plan Note (Signed)
 Patient brought blood sugar log with him today- he is measuring and logging am and pm blood sugars.  AM sugars are ranging between 117-159.  PM blood sugars are 98-305.  PM blood sugars averaging at 130s.  Patient to continue to monitor sugars and will bring log to next appointment.

## 2023-10-31 DIAGNOSIS — S83241A Other tear of medial meniscus, current injury, right knee, initial encounter: Secondary | ICD-10-CM | POA: Diagnosis not present

## 2023-11-21 DIAGNOSIS — N184 Chronic kidney disease, stage 4 (severe): Secondary | ICD-10-CM | POA: Diagnosis not present

## 2023-11-22 ENCOUNTER — Other Ambulatory Visit: Payer: Self-pay

## 2023-11-22 DIAGNOSIS — I480 Paroxysmal atrial fibrillation: Secondary | ICD-10-CM

## 2023-11-22 MED ORDER — RIVAROXABAN 15 MG PO TABS: 15.0000 mg | ORAL_TABLET | Freq: Every day | ORAL | 1 refills | Status: DC

## 2023-11-22 NOTE — Telephone Encounter (Signed)
 Received faxed refill request for Xarelto  from [facility]. Pt last saw Mark Heinrich, PA on 10/03/23, last labs 06/02/23 Creat 2.40, age 78, weight 102.1kg, CrCl 36.63, based on CrCl pt is on appropriate dosage of Xarelto  15mg  every day for afib.  Will refill rx.

## 2023-11-24 DIAGNOSIS — E1122 Type 2 diabetes mellitus with diabetic chronic kidney disease: Secondary | ICD-10-CM | POA: Diagnosis not present

## 2023-11-24 DIAGNOSIS — N041 Nephrotic syndrome with focal and segmental glomerular lesions: Secondary | ICD-10-CM | POA: Diagnosis not present

## 2023-11-24 DIAGNOSIS — I10 Essential (primary) hypertension: Secondary | ICD-10-CM | POA: Diagnosis not present

## 2023-11-24 DIAGNOSIS — I5030 Unspecified diastolic (congestive) heart failure: Secondary | ICD-10-CM | POA: Diagnosis not present

## 2023-11-24 DIAGNOSIS — N184 Chronic kidney disease, stage 4 (severe): Secondary | ICD-10-CM | POA: Diagnosis not present

## 2023-11-24 DIAGNOSIS — Z6841 Body Mass Index (BMI) 40.0 and over, adult: Secondary | ICD-10-CM | POA: Diagnosis not present

## 2023-11-24 DIAGNOSIS — I4819 Other persistent atrial fibrillation: Secondary | ICD-10-CM | POA: Diagnosis not present

## 2023-11-28 ENCOUNTER — Ambulatory Visit (INDEPENDENT_AMBULATORY_CARE_PROVIDER_SITE_OTHER): Admitting: Family Medicine

## 2023-11-28 ENCOUNTER — Encounter (INDEPENDENT_AMBULATORY_CARE_PROVIDER_SITE_OTHER): Payer: Self-pay | Admitting: Family Medicine

## 2023-11-28 VITALS — BP 118/56 | HR 58 | Temp 98.1°F | Ht 69.0 in | Wt 224.0 lb

## 2023-11-28 DIAGNOSIS — E669 Obesity, unspecified: Secondary | ICD-10-CM | POA: Diagnosis not present

## 2023-11-28 DIAGNOSIS — E1122 Type 2 diabetes mellitus with diabetic chronic kidney disease: Secondary | ICD-10-CM

## 2023-11-28 DIAGNOSIS — Z6833 Body mass index (BMI) 33.0-33.9, adult: Secondary | ICD-10-CM

## 2023-11-28 DIAGNOSIS — Z794 Long term (current) use of insulin: Secondary | ICD-10-CM | POA: Diagnosis not present

## 2023-11-28 DIAGNOSIS — N184 Chronic kidney disease, stage 4 (severe): Secondary | ICD-10-CM

## 2023-11-28 DIAGNOSIS — Z7985 Long-term (current) use of injectable non-insulin antidiabetic drugs: Secondary | ICD-10-CM | POA: Diagnosis not present

## 2023-11-28 NOTE — Assessment & Plan Note (Signed)
 Elevated blood sugars around time of prednisone injection (209, 328, 258).  He has been taking ozempic 1mg  weekly.  No change in current treatment- needs repeat labs in 2 months.

## 2023-11-28 NOTE — Progress Notes (Unsigned)
 SUBJECTIVE:  Chief Complaint: Obesity  Interim History: Patient last seen on 6/30.  Foodwise he has been trying to follow the pescatarian plan more.  He tried to keep a record of what he was doing on the plan.  He had to get rid of all the food in the house initially.  He had some issues finding some of the food on the plan. He is eating the progresso soup and plant based crumbles.  Sometimes when he goes out he switches lunch for supper.  This plan has been easier to follow than the prior plan.  Saw nephrology the other day and mentions that his numbers looked good.  Mark Singh is here to discuss his progress with his obesity treatment plan. He is on the [employer] and states he is following his eating plan approximately 60 % of the time. He states he is exercising 120 minutes 5 times per week.   OBJECTIVE: Visit Diagnoses: Problem List Items Addressed This Visit       Endocrine   Type 2 diabetes mellitus with stage 4 chronic kidney disease, with long-term current use of insulin  (HCC)   Elevated blood sugars around time of prednisone injection (209, 328, 258).  He has been taking ozempic 1mg  weekly.  No change in current treatment- needs repeat labs in 2 months.        Genitourinary   CKD (chronic kidney disease) stage 4, GFR 15-29 ml/min (HCC) - Primary     Other   Obesity with starting BMI of 39.2/date 07/06/22   Other Visit Diagnoses       BMI 33.0-33.9,adult           Vitals Temp: 98.1 F (36.7 C) BP: (!) 118/56 Pulse Rate: (!) 58 SpO2: 98 %   Anthropometric Measurements Height: 5' 9 (1.753 m) Weight: 224 lb (101.6 kg) BMI (Calculated): 33.06 Weight at Last Visit: 225 lb Weight Lost Since Last Visit: 1 Weight Gained Since Last Visit: 0 Starting Weight: 265 lb Total Weight Loss (lbs): 40 lb (18.1 kg)   Body Composition  Body Fat %: 34.6 % Fat Mass (lbs): 77.6 lbs Muscle Mass (lbs): 139.2 lbs Total Body Water (lbs): 103.6 lbs Visceral Fat Rating :  22   Other Clinical Data Today's Visit #: 16 Starting Date: 07/06/22 Comments: Pesc + 300     ASSESSMENT AND PLAN: Assessment & Plan CKD (chronic kidney disease) stage 4, GFR 15-29 ml/min (HCC) Patient reports no change recently in his kidney function test.  Will continue to follow up with nephrology at already scheduled appointments. Type 2 diabetes mellitus with stage 4 chronic kidney disease, with long-term current use of insulin  (HCC) Elevated blood sugars around time of prednisone injection (209, 328, 258).  He has been taking ozempic 1mg  weekly.  No change in current treatment- needs repeat labs in 2 months. Obesity with starting BMI of 39.2/date 07/06/22  BMI 33.0-33.9,adult    Diet: Mark Singh is currently in the action stage of change. As such, his goal is to continue with weight loss efforts and has agreed to the Pescatarian Plan + 300.  Exercise:  Older adults should do exercises that maintain or improve balance if they are at risk of falling.   Behavior Modification:  We discussed the following Behavioral Modification Strategies today: increasing lean protein intake, decreasing simple carbohydrates, increasing vegetables, meal planning and cooking strategies, and planning for success.   Return in about 8 weeks (around 01/23/2024).   He was informed of the importance of  frequent follow up visits to maximize his success with intensive lifestyle modifications for his multiple health conditions.  Attestation Statements:   Reviewed by clinician on day of visit: allergies, medications, problem list, medical history, surgical history, family history, social history, and previous encounter notes.     Adelita Cho, MD

## 2023-11-30 NOTE — Assessment & Plan Note (Signed)
 Patient reports no change recently in his kidney function test.  Will continue to follow up with nephrology at already scheduled appointments.

## 2023-12-04 NOTE — Progress Notes (Signed)
 Patient ID: Mark Singh MRN: [id number] DOB/AGE: 79/23/1947 79 y.o.  Primary Care Physician:Berry, Dorn PARAS, MD Primary Cardiologist: Jeffrie Anes, MD  CC: Follow-up for aortic stenosis  FOCUSED CARDIOVASCULAR PROBLEM LIST:   Aortic stenosis AVA 1, MG 19, V-max 3 with SVI 29, EF 55 to 60% TTE 2024 AVA 1.2, MG 19, EF 55 to 60% TTE 2023 Atrial fibrillation On Eliquis PVI 2019 Atrial flutter CTI 2019 PVC  Ablation 2016 Hypertension Hyperlipidemia LP(a) 26.3 CKD stage IV Followed by nephrology (Dr. Rayburn) Type 2 diabetes Not on insulin  OSA On CPAP BMI 35  HISTORY OF PRESENT ILLNESS: January 2024: The patient is a 78 y.o. male with the indicated medical history here for recommendations regarding his aortic stenosis and tricuspid regurgitation.  He was seen by cardiology in early December due to worsening shortness of breath.  He usually is able to walk a mile however he has been able to do this over the last several weeks.  The patient has noticed increasing fatigue and shortness of breath with walking.  He has had no presyncope or syncope.  He is also noted some increasing peripheral edema.  He denies any orthopnea.  He has had no severe bleeding or bruising episodes while on Xarelto .  He is able to do most of his activities of daily living but he likes to exercise 3-4 times a week and has noticed increasing fatigue and shortness of breath with this activity that he enjoys.  He fortunately has not required any emergency room visits or hospitalizations for breathing issues or volume status.  He is retired for 8 years.  He was in [employer].  He does not smoke.  He sees a Education officer, community on a regular basis and reports good dental health.  Plan: Obtain TAVR protocol CTA and staged coronary angiography if coronary arteries are not able to be assessed on CTA.  5/24: In the interim the patient's nephrologist was contacted regarding the possibility of obtaining a  TAVR protocol CTA.  Given his poor kidney function they thought that this would greatly increase the his risk of progression to requiring renal replacement therapy.  For this reason the CT scan was deferred.  The patient joined an exercise program and feels much better.  He is able to toe walk and do most of his activities of daily living without any issues.  He fortunately has not required any emergency room visits or hospitalizations.  He tells me he feels much better than when he saw me before.  Plan: Follow-up in 9 months.  2/25: The patient returns for routine follow-up.  He was seen by his PCP recently and was doing well.  His blood pressure was well-controlled.  The patient has lost about 30 pounds on Ozempic.  He feels much better.  He no longer needs insulin .  He is able to walk on a treadmill at around 2-1/2 miles an hour without any issues.  If he goes above the speed he will feel short of breath.  He exercises with a trainer and has noticed that his dizziness is much improved.  He denies any shortness of breath at rest, significant paroxysmal nocturnal dyspnea, orthopnea.  He has had no signs or symptoms of stroke and denies any severe bleeding or bruising.  He is otherwise well and without significant cardiovascular complaints today.  Plan: Follow-up in 6 months with echocardiogram; check lipid panel, LFTs, LP(a).  August 2025:  Patient consents to use of AI scribe. The patient's LDL  was 40 and the LP(a) was not elevated.  He was seen by general cardiology in June and his losartan  was decreased to 25 mg.  No chest pain or shortness of breath. He experiences episodes of lightheadedness, particularly when looking up at a menu or after walking and then sitting down. No lightheadedness occurs when transitioning from sitting to standing or from lying down to sitting.  He has stage four kidney disease with stable kidney function based on recent lab results. He is not on dialysis. He takes losartan ,  which was reduced from 50 mg to 25 mg, and his blood pressure is well-controlled.  He is on Xarelto  for anticoagulation and experiences normal bruising with no significant bleeding issues. He has lost 41 pounds with the use of Ozempic and feels better overall. He no longer requires insulin , and his last A1c was 6.7.  He has a history of sleep apnea and continues to use a CPAP machine, although it causes some sleep disturbances. He has not discussed the potential resolution of sleep apnea since his weight loss.  Past Medical History:  Diagnosis Date   Arthritis    hands, feet (12/13/2017)   Asthma    Atrial fibrillation (HCC)    Bradycardia    a. Repoted h/o HR in the 40s.   Constipation    Edema    a. 2D echo 11/2013: EF 55-60%, LV diastolic function parameters were normal, severely dilated LA, PASP ..   H/O: gout    right foot; on daily RX (12/13/2017)   Heart valve problem    HTN (hypertension)    Hyperlipidemia    Joint pain    Kidney disease, chronic, stage IV (GFR 15-29 ml/min) (HCC)    Myalgia and myositis, unspecified    Nephrotic syndrome    a. responder to steroid therapy by Dr. Betsey.    Obesity    Orthostatic hypotension    Penile cancer (HCC)    did circ   Pulmonary hypertension (HCC)    a. Mild pulm HTN felt 2/2 obesity.   PVC's (premature ventricular contractions)    a. Holter 05/2013 showing 18k PVCs (18% of the time).   Seasonal asthma    mild   Shoulder pain    Sleep apnea    SOB (shortness of breath)    Squamous carcinoma    right hand; left shoulder (12/13/2017)   Type 2 diabetes mellitus (HCC)     Past Surgical History:  Procedure Laterality Date   A-FLUTTER ABLATION N/A 02/23/2018   Procedure: A-FLUTTER ABLATION;  Surgeon: Inocencio Soyla Lunger, MD;  Location: [facility];  Service: Cardiovascular;  Laterality: N/A;   ATRIAL FIBRILLATION ABLATION N/A 12/13/2017   Procedure: ATRIAL FIBRILLATION ABLATION;  Surgeon: Inocencio Soyla Lunger, MD;  Location: [facility];  Service: Cardiovascular;  Laterality: N/A;   BUNIONECTOMY WITH HAMMERTOE RECONSTRUCTION Left 1992  &  2008   REMOVAL HEAL SPUR/ BUNIONECTOMY AND HAMMERTOE REPAIR    CARDIOVERSION N/A 07/22/2015   Procedure: CARDIOVERSION;  Surgeon: Leim VEAR Moose, MD;  Location: [facility] ENDOSCOPY;  Service: Cardiovascular;  Laterality: N/A;   CARDIOVERSION N/A 10/06/2015   Procedure: CARDIOVERSION;  Surgeon: Wilbert JONELLE Bihari, MD;  Location: [facility] ENDOSCOPY;  Service: Cardiovascular;  Laterality: N/A;   CARDIOVERSION N/A 12/05/2016   Procedure: CARDIOVERSION;  Surgeon: Mona Vinie BROCKS, MD;  Location: [facility] ENDOSCOPY;  Service: Cardiovascular;  Laterality: N/A;   CARDIOVERSION N/A 06/29/2018   Procedure: CARDIOVERSION;  Surgeon: Alveta Aleene PARAS, MD;  Location:  [facility] ENDOSCOPY;  Service: Cardiovascular;  Laterality: N/A;   CARDIOVERSION N/A 09/20/2018   Procedure: CARDIOVERSION;  Surgeon: Raford Riggs, MD;  Location: [facility] ENDOSCOPY;  Service: Cardiovascular;  Laterality: N/A;   CARDIOVERSION N/A 03/24/2021   Procedure: CARDIOVERSION;  Surgeon: Hobart Powell BRAVO, MD;  Location: [facility] ENDOSCOPY;  Service: Cardiovascular;  Laterality: N/A;   CATARACT EXTRACTION     CIRCUMCISION N/A 11/27/2012   Procedure:  CIRCUMCISION  AND EXCISION OF GLANS PENIS;  Surgeon: Donnice Gwenyth Brooks, MD;  Location: [facility];  Service: Urology;  Laterality: N/A;   ELECTROPHYSIOLOGIC STUDY N/A 03/30/2015   Procedure: PVC Ablation;  Surgeon: Will Gladis Norton, MD;  Location: [facility];  Service: Cardiovascular;  Laterality: N/A;   HERNIA REPAIR     SQUAMOUS CELL CARCINOMA EXCISION     right hand; left shoulder (12/13/2017)   UMBILICAL HERNIA REPAIR  11/04/2005    Family History  Problem Relation Age of Onset   Coronary artery disease Mother    Stroke Father    Hypertension Father    Diabetes Father    CVA Father    Heart disease Father     Social History    Socioeconomic History   Marital status: Widowed    Spouse name: Not on file   Number of children: Not on file   Years of education: Not on file   Highest education level: Not on file  Occupational History   Occupation: Land[employer]per-Retired  Tobacco Use   Smoking status: Never   Smokeless tobacco: Never   Tobacco comments:    Never smoke 07/15/21  Vaping Use   Vaping status: Never Used  Substance and Sexual Activity   Alcohol use: Never   Drug use: Never   Sexual activity: Not Currently  Other Topics Concern   Not on file  Social History Narrative   Not on file   Social Drivers of Health   Financial Resource Strain: Not on file  Food Insecurity: Not on file  Transportation Needs: Not on file  Physical Activity: Not on file  Stress: Not on file  Social Connections: Not on file  Intimate Partner Violence: Not on file     Prior to Admission medications   Medication Sig Start Date End Date Taking? Authorizing Provider  acetaminophen  (TYLENOL ) 500 MG tablet Take 1,000 mg by mouth every 6 (six) hours as needed for moderate pain.    [provider]  allopurinol (ZYLOPRIM) 100 MG tablet Take 50 mg by mouth every other day.    [provider]  amiodarone  (PACERONE ) 200 MG tablet Take 0.5 tablets (100 mg total) by mouth daily. 03/02/22   Camnitz, Soyla Gladis, MD  amLODipine  (NORVASC ) 10 MG tablet TAKE 1 TABLET BY MOUTH EVERY DAY 08/24/20   Leverne Charlies Helling, PA-C  atorvastatin (LIPITOR) 40 MG tablet Take 40 mg by mouth daily.    [provider]  BD PEN NEEDLE NANO 2ND GEN 32G X 4 MM MISC  12/23/19   [provider]  betamethasone  valerate ointment (VALISONE ) 0.1 % 1 application 2 (two) times daily as needed (precancerous spots). 11/25/19   [provider]  Cyanocobalamin  (B-12 PO) Take 1 capsule by mouth daily.    [provider]  Dulaglutide (TRULICITY) 1.5 MG/0.5ML SOPN Inject 1.5 mg into the skin every Wednesday.    [provider]  hydrALAZINE  (APRESOLINE ) 50 MG tablet Take 1 tablet (50 mg total) by mouth 2 (two) times daily. Please contact the office to  schedule appointment for additional refills. 03/15/22   Jeffrie Oneil BROCKS, MD  KLOR-CON  M20 20 MEQ tablet TAKE 1 TABLET BY MOUTH EVERY DAY 05/18/21   Jeffrie Oneil BROCKS, MD  LANTUS  SOLOSTAR 100 UNIT/ML Solostar Pen Inject 30 Units into the skin at bedtime. 02/08/18   [provider]  losartan  (COZAAR ) 50 MG tablet TAKE 1 TABLET BY MOUTH EVERY DAY 04/01/22   Jeffrie Oneil BROCKS, MD  omeprazole (PRILOSEC) 20 MG capsule Take 20 mg by mouth daily. 06/25/18   [provider]  OneTouch Delica Lancets 33G MISC USE AS DIRECTED EVERY DAY 12/16/18   [provider]  AISHA VERIO test strip USE AS DIRECTED (TEST DAILY) 90 **E11.9 01/20/19   [provider]  oxymetazoline (AFRIN) 0.05 % nasal spray Place 1 spray into both nostrils 2 (two) times daily as needed for congestion.    [provider]  pimecrolimus (ELIDEL) 1 % cream Apply 1 application topically 2 (two) times daily as needed (psoriasis).     [provider]  spironolactone  (ALDACTONE ) 25 MG tablet Take 0.5 tablets (12.5 mg total) by mouth daily. 04/18/22   Jeffrie Oneil BROCKS, MD  tamsulosin  (FLOMAX ) 0.4 MG CAPS capsule Take 0.4 mg by mouth every evening.  03/26/16   [provider]  torsemide  (DEMADEX ) 20 MG tablet TAKE 3 TABLETS BY MOUTH EVERY DAY 05/18/21   Jeffrie Oneil BROCKS, MD  VITAMIN D  PO Take 1 capsule by mouth daily.    [provider]  NAPOLEON INHUB 100-50 MCG/DOSE AEPB Inhale 1 puff into the lungs 2 (two) times daily. 09/28/18   [provider]  XARELTO  15 MG TABS tablet TAKE 1 TABLET (15 MG TOTAL) BY MOUTH DAILY WITH SUPPER 11/26/21   Jeffrie Oneil BROCKS, MD    Allergies  Allergen Reactions   Indomethacin Other (See Comments)    dizziness    REVIEW OF SYSTEMS:  General: no fevers/chills/night sweats Eyes: no blurry vision, diplopia, or  amaurosis ENT: no sore throat or hearing loss Resp: no cough, wheezing, or hemoptysis CV: no edema or palpitations GI: no abdominal pain, nausea, vomiting, diarrhea, or constipation GU: no dysuria, frequency, or hematuria Skin: no rash Neuro: no headache, numbness, tingling, or weakness of extremities Musculoskeletal: no joint pain or swelling Heme: no bleeding, DVT, or easy bruising Endo: no polydipsia or polyuria  BP (!) 112/44   Pulse 60   Ht 5' 9 (1.753 m)   Wt 228 lb 6.4 oz (103.6 kg)   SpO2 97%   BMI 33.73 kg/m   PHYSICAL EXAM: GEN:  AO x 3 in no acute distress HEENT: normal Dentition: Normal Neck: JVP normal. +2 carotid upstrokes without bruits. No thyromegaly. Lungs: equal expansion, clear bilaterally CV: Apex is discrete and nondisplaced, RRR with very soft systolic ejection murmur Abd: soft, non-tender, non-distended; no bruit; positive bowel sounds Ext: no edema, ecchymoses, or cyanosis Vascular: 2+ femoral pulses, 2+ radial pulses       Skin: warm and dry without rash Neuro: CN II-XII grossly intact; motor and sensory grossly intact    DATA AND STUDIES:  EKG: EKG done toay demonstrates NSR  2D ECHO:  2025:  1. Left ventricular ejection fraction, by estimation, is 60 to 65%. The  left ventricle has normal function. The left ventricle has no regional  wall motion abnormalities. Left ventricular diastolic parameters were  normal.   2. Right ventricular systolic function is normal. The right ventricular  size is normal.   3. Left atrial size was  severely dilated.   4. Right atrial size was moderately dilated.   5. The mitral valve is abnormal. Trivial mitral valve regurgitation. No  evidence of mitral stenosis.   6. Tricuspid valve regurgitation is moderate.   7. Gradients similar to TTE done 09/19/22. The aortic valve is tricuspid.  There is moderate calcification of the aortic valve. There is moderate  thickening of the aortic valve. Aortic valve  regurgitation is trivial.  Moderate aortic valve stenosis.   8. The inferior vena cava is normal in size with greater than 50%  respiratory variability, suggesting right atrial pressure of 3 mmHg.   Aortic Valve: Gradients similar to TTE done 09/19/22. The aortic valve is  tricuspid. There is moderate calcification of the aortic valve. There is  moderate thickening of the aortic valve. Aortic valve regurgitation is  trivial. Moderate aortic stenosis is  present. Aortic valve mean gradient measures 25.8 mmHg. Aortic valve peak  gradient measures 48.5 mmHg. Aortic valve area, by VTI measures 1.34 cm.    2023 1. Left ventricular ejection fraction, by estimation, is 55 to 60%. The  left ventricle has normal function. The left ventricle has no regional  wall motion abnormalities. Left ventricular diastolic parameters are  consistent with Grade II diastolic  dysfunction (pseudonormalization). The average left ventricular global  longitudinal strain is -23.7 %. The global longitudinal strain is normal.   2. Right ventricular systolic function is normal. The right ventricular  size is moderately enlarged. There is moderately elevated pulmonary artery  systolic pressure. The estimated right ventricular systolic pressure is  46.7 mmHg.   3. Left atrial size was severely dilated.   4. Right atrial size was severely dilated.   5. The mitral valve is normal in structure. Mild mitral valve  regurgitation. No evidence of mitral stenosis.   6. Tricuspid valve regurgitation is moderate to severe.   7. The aortic valve is tricuspid. There is mild calcification of the  aortic valve. There is moderate thickening of the aortic valve. Aortic  valve regurgitation is not visualized. Moderate aortic valve stenosis.   8. The inferior vena cava is dilated in size with >50% respiratory  variability, suggesting right atrial pressure of 8 mmHg.   CARDIAC CATH: n/a  STS RISK CALCULATOR: pending  NHYA CLASS:  1    ASSESSMENT AND PLAN:   Nonrheumatic aortic valve stenosis - Plan: EKG 12-Lead, ECHOCARDIOGRAM COMPLETE  Persistent atrial fibrillation (HCC) - Plan: EKG 12-Lead  Secondary hypercoagulable state (HCC) - Plan: EKG 12-Lead  Type 2 diabetes mellitus with complication, without long-term current use of insulin  (HCC) - Plan: EKG 12-Lead  Hypertension associated with diabetes (HCC) - Plan: EKG 12-Lead  Hyperlipidemia associated with type 2 diabetes mellitus (HCC) - Plan: EKG 12-Lead  CKD (chronic kidney disease) stage 4, GFR 15-29 ml/min (HCC) - Plan: EKG 12-Lead  BMI 35.0-35.9,adult - Plan: EKG 12-Lead   Aortic stenosis: Will follow-up patient's echocardiogram performed today.  The patient continues to do well without significant shortness of breath or chest discomfort especially after he is lost close to 40 pounds while on Ozempic.  He has atypical lightheadedness that is not routinely occur when he goes from sitting to standing.  I had a long discussion with the patient about his potential options.  Since he is asymptomatic we will continue to monitor him.  I did speak to him about the moderate aortic stenosis continuing exercise program versus the upcoming K[employer] trial.  He would be very interested in participating in this  trial.  I will let our research personnel know about his desires.  We are hopeful that this trial will be up and running at our institution in the next few months.  Otherwise I will see you back in 6 months with an echocardiogram or earlier if needed. Persistent atrial fibrillation: Followed by EP.  Currently on Xarelto  15 mg and amiodarone  100 mg. Secondary hypercoagulable state: Continue Xarelto  15 mg. Type 2 diabetes mellitus: Continue Xarelto  15 mg, losartan  25 mg, atorvastatin 40 mg.  Start Jardiance  10 mg daily Hypertension: Continue amlodipine  5 mg, losartan  25 mg, and spironolactone  25 mg. Hyperlipidemia: Continue atorvastatin 40 mg, LDL was 40 in February  2025 CKD stage IV: Continue losartan  25 mg; start Jardiance  10 mg daily for renal protection. Elevated BMI: Continue Ozempic 1 mg q. Weekly  I spent 35 minutes reviewing all clinical data during and prior to this visit including all relevant imaging studies, laboratories, clinical information from other health systems and prior notes from both Cardiology and other specialties, interviewing the patient, conducting a complete physical examination, and coordinating care in order to formulate a comprehensive and personalized evaluation and treatment plan.   Jaianna Nicoll K Kimm Ungaro, MD  12/11/2023 11:39 AM    [facility] [address] [place], [place], [place]  [address] Phone: [phone]; Fax: [phone]

## 2023-12-11 ENCOUNTER — Ambulatory Visit: Attending: Internal Medicine | Admitting: Internal Medicine

## 2023-12-11 ENCOUNTER — Ambulatory Visit (HOSPITAL_COMMUNITY)
Admission: RE | Admit: 2023-12-11 | Discharge: 2023-12-11 | Disposition: A | Discharge status: Home / Self Care | Source: Ambulatory Visit | Attending: Cardiovascular Disease | Admitting: Cardiovascular Disease

## 2023-12-11 ENCOUNTER — Encounter: Payer: Self-pay | Admitting: Internal Medicine

## 2023-12-11 VITALS — BP 112/44 | HR 60 | Ht 69.0 in | Wt 228.4 lb

## 2023-12-11 DIAGNOSIS — N184 Chronic kidney disease, stage 4 (severe): Secondary | ICD-10-CM

## 2023-12-11 DIAGNOSIS — E118 Type 2 diabetes mellitus with unspecified complications: Secondary | ICD-10-CM | POA: Diagnosis not present

## 2023-12-11 DIAGNOSIS — E1169 Type 2 diabetes mellitus with other specified complication: Secondary | ICD-10-CM | POA: Diagnosis not present

## 2023-12-11 DIAGNOSIS — I152 Hypertension secondary to endocrine disorders: Secondary | ICD-10-CM | POA: Diagnosis not present

## 2023-12-11 DIAGNOSIS — D6869 Other thrombophilia: Secondary | ICD-10-CM | POA: Diagnosis not present

## 2023-12-11 DIAGNOSIS — J45909 Unspecified asthma, uncomplicated: Secondary | ICD-10-CM | POA: Diagnosis not present

## 2023-12-11 DIAGNOSIS — G4734 Idiopathic sleep related nonobstructive alveolar hypoventilation: Secondary | ICD-10-CM | POA: Diagnosis not present

## 2023-12-11 DIAGNOSIS — G4733 Obstructive sleep apnea (adult) (pediatric): Secondary | ICD-10-CM | POA: Diagnosis not present

## 2023-12-11 DIAGNOSIS — I350 Nonrheumatic aortic (valve) stenosis: Secondary | ICD-10-CM

## 2023-12-11 DIAGNOSIS — E1159 Type 2 diabetes mellitus with other circulatory complications: Secondary | ICD-10-CM | POA: Diagnosis not present

## 2023-12-11 DIAGNOSIS — I4819 Other persistent atrial fibrillation: Secondary | ICD-10-CM | POA: Diagnosis not present

## 2023-12-11 DIAGNOSIS — E785 Hyperlipidemia, unspecified: Secondary | ICD-10-CM

## 2023-12-11 DIAGNOSIS — Z6835 Body mass index (BMI) 35.0-35.9, adult: Secondary | ICD-10-CM

## 2023-12-11 DIAGNOSIS — I10 Essential (primary) hypertension: Secondary | ICD-10-CM | POA: Diagnosis not present

## 2023-12-11 LAB — ECHOCARDIOGRAM COMPLETE
AR max vel: 1.18 cm2
AV Area VTI: 1.08 cm2
AV Area mean vel: 1.16 cm2
AV Mean grad: 26 mmHg
AV Peak grad: 46.8 mmHg
Ao pk vel: 3.42 m/s
Area-P 1/2: 3.89 cm2
S' Lateral: 3 cm

## 2023-12-11 MED ORDER — EMPAGLIFLOZIN 10 MG PO TABS: 10.0000 mg | ORAL_TABLET | Freq: Every day | ORAL | 5 refills | Status: DC

## 2023-12-11 NOTE — Patient Instructions (Signed)
 Medication Instructions:  Your physician has recommended you make the following change in your medication:  1.) start Jardiance  10 mg - take one tablet daily before breakfast  *If you need a refill on your cardiac medications before your next appointment, please call your pharmacy*  Lab Work: none  Testing/Procedures: Your physician has requested that you have an echocardiogram. Echocardiography is a painless test that uses sound waves to create images of your heart. It provides your doctor with information about the size and shape of your heart and how well your heart's chambers and valves are working. This procedure takes approximately one hour. There are no restrictions for this procedure. Please do NOT wear cologne, perfume, aftershave, or lotions (deodorant is allowed). Please arrive 15 minutes prior to your appointment time.  Please note: We ask at that you not bring children with you during ultrasound (echo/ vascular) testing. Due to room size and safety concerns, children are not allowed in the ultrasound rooms during exams. Our front office staff cannot provide observation of children in our lobby area while testing is being conducted. An adult accompanying a patient to their appointment will only be allowed in the ultrasound room at the discretion of the ultrasound technician under special circumstances. We apologize for any inconvenience.   Follow-Up: At [facility], you and your health needs are our priority.  As part of our continuing mission to provide you with exceptional heart care, our providers are all part of one team.  This team includes your primary Cardiologist (physician) and Advanced Practice Providers or APPs (Physician Assistants and Nurse Practitioners) who all work together to provide you with the care you need, when you need it.  Your next appointment:   6 month(s)  Provider:   Arun Thukkani, MD

## 2023-12-12 DIAGNOSIS — M170 Bilateral primary osteoarthritis of knee: Secondary | ICD-10-CM | POA: Diagnosis not present

## 2023-12-20 ENCOUNTER — Ambulatory Visit: Admitting: Internal Medicine

## 2023-12-20 ENCOUNTER — Other Ambulatory Visit (HOSPITAL_COMMUNITY)

## 2024-01-15 DIAGNOSIS — G4734 Idiopathic sleep related nonobstructive alveolar hypoventilation: Secondary | ICD-10-CM | POA: Diagnosis not present

## 2024-01-15 DIAGNOSIS — G4733 Obstructive sleep apnea (adult) (pediatric): Secondary | ICD-10-CM | POA: Diagnosis not present

## 2024-01-15 DIAGNOSIS — I10 Essential (primary) hypertension: Secondary | ICD-10-CM | POA: Diagnosis not present

## 2024-01-15 DIAGNOSIS — J45909 Unspecified asthma, uncomplicated: Secondary | ICD-10-CM | POA: Diagnosis not present

## 2024-01-23 DIAGNOSIS — M109 Gout, unspecified: Secondary | ICD-10-CM | POA: Diagnosis not present

## 2024-01-23 DIAGNOSIS — N184 Chronic kidney disease, stage 4 (severe): Secondary | ICD-10-CM | POA: Diagnosis not present

## 2024-01-23 DIAGNOSIS — I129 Hypertensive chronic kidney disease with stage 1 through stage 4 chronic kidney disease, or unspecified chronic kidney disease: Secondary | ICD-10-CM | POA: Diagnosis not present

## 2024-01-23 DIAGNOSIS — I5032 Chronic diastolic (congestive) heart failure: Secondary | ICD-10-CM | POA: Diagnosis not present

## 2024-01-23 DIAGNOSIS — E1122 Type 2 diabetes mellitus with diabetic chronic kidney disease: Secondary | ICD-10-CM | POA: Diagnosis not present

## 2024-01-23 DIAGNOSIS — E785 Hyperlipidemia, unspecified: Secondary | ICD-10-CM | POA: Diagnosis not present

## 2024-01-23 DIAGNOSIS — J4530 Mild persistent asthma, uncomplicated: Secondary | ICD-10-CM | POA: Diagnosis not present

## 2024-01-23 DIAGNOSIS — I350 Nonrheumatic aortic (valve) stenosis: Secondary | ICD-10-CM | POA: Diagnosis not present

## 2024-01-23 DIAGNOSIS — E66811 Obesity, class 1: Secondary | ICD-10-CM | POA: Diagnosis not present

## 2024-01-23 LAB — PROTEIN / CREATININE RATIO, URINE
Creatinine, Urine: 31
Microalb Creat Ratio: <22.7

## 2024-01-23 LAB — MICROALBUMIN, URINE: Microalb, Ur: <0.7

## 2024-01-23 LAB — HEMOGLOBIN A1C: Hemoglobin A1C: 6.6

## 2024-01-24 DIAGNOSIS — N184 Chronic kidney disease, stage 4 (severe): Secondary | ICD-10-CM | POA: Diagnosis not present

## 2024-01-24 LAB — COMPREHENSIVE METABOLIC PANEL WITH GFR: eGFR: 27

## 2024-01-24 LAB — BASIC METABOLIC PANEL WITH GFR
CO2: 23 — AB (ref 13–22)
Chloride: 97 — AB (ref 99–108)
Creatinine: 2.4 — AB (ref 0.6–1.3)
Glucose: 114
Potassium: 4.4 meq/L (ref 3.5–5.1)
Sodium: 138 (ref 137–147)

## 2024-01-30 ENCOUNTER — Ambulatory Visit (INDEPENDENT_AMBULATORY_CARE_PROVIDER_SITE_OTHER): Admitting: Family Medicine

## 2024-01-30 ENCOUNTER — Encounter (INDEPENDENT_AMBULATORY_CARE_PROVIDER_SITE_OTHER): Payer: Self-pay | Admitting: Family Medicine

## 2024-01-30 ENCOUNTER — Encounter (INDEPENDENT_AMBULATORY_CARE_PROVIDER_SITE_OTHER): Payer: Self-pay | Admitting: Internal Medicine

## 2024-01-30 VITALS — BP 108/61 | HR 59 | Temp 97.7°F | Ht 69.0 in | Wt 222.0 lb

## 2024-01-30 DIAGNOSIS — N184 Chronic kidney disease, stage 4 (severe): Secondary | ICD-10-CM | POA: Diagnosis not present

## 2024-01-30 DIAGNOSIS — Z7985 Long-term (current) use of injectable non-insulin antidiabetic drugs: Secondary | ICD-10-CM

## 2024-01-30 DIAGNOSIS — E1159 Type 2 diabetes mellitus with other circulatory complications: Secondary | ICD-10-CM

## 2024-01-30 DIAGNOSIS — E1122 Type 2 diabetes mellitus with diabetic chronic kidney disease: Secondary | ICD-10-CM

## 2024-01-30 DIAGNOSIS — I10 Essential (primary) hypertension: Secondary | ICD-10-CM | POA: Diagnosis not present

## 2024-01-30 DIAGNOSIS — I152 Hypertension secondary to endocrine disorders: Secondary | ICD-10-CM | POA: Diagnosis not present

## 2024-01-30 DIAGNOSIS — Z7984 Long term (current) use of oral hypoglycemic drugs: Secondary | ICD-10-CM

## 2024-01-30 DIAGNOSIS — E669 Obesity, unspecified: Secondary | ICD-10-CM | POA: Diagnosis not present

## 2024-01-30 DIAGNOSIS — Z6832 Body mass index (BMI) 32.0-32.9, adult: Secondary | ICD-10-CM

## 2024-01-30 DIAGNOSIS — Z794 Long term (current) use of insulin: Secondary | ICD-10-CM | POA: Diagnosis not present

## 2024-01-30 NOTE — Assessment & Plan Note (Addendum)
 Patient brought blood sugar log today.  His fasting blood sugar has been averaging 120s (low of 105).  PM sugar average around 150. Recent A1c of 6.6 on 01/23/24. Creatinine stable and slightly lower at 2.4.  He is on ozempic weekly at 1mg  and now on jardiance  10mg .  Continue current medications and dosage.

## 2024-01-30 NOTE — Assessment & Plan Note (Signed)
 Patient brought blood sugar log today.  His fasting blood sugar has been averaging 120s (low of 105).  PM sugar average around 150. Recent A1c of 6.6 on 01/23/24. Creatinine stable and slightly lower at 2.4.  He is on ozempic weekly at 1mg  and now on jardiance  10mg .  Continue current medications and dosage.

## 2024-01-30 NOTE — Assessment & Plan Note (Signed)
 Patient to reach out to cardiology.  BP of 116/68 at recent PCP appointment and BP of 108/61.  Patient to ask cardiology if medication adjustment is needed given his occasional low BP and dizziness he is feeling intermittently.

## 2024-01-30 NOTE — Progress Notes (Signed)
 SUBJECTIVE:  Chief Complaint: Obesity  Interim History: Patient has been doing most of his usual stuff over the rest of the summer.  He isn't eating as much in comparison to how he used to eat. He is getting protein in but the rest of the food he is eating smaller portions of.  Patient has been eating a smart ones and one sausage patty and then will do beans, vegetables to mix with a chicken patty hamburger patty.  Feels full from the food he is getting in.  He will also get some fruit or applesauce in.  Occasionally he may have a cookie. He is trying to eat lunch everyday but is often eating out.  He will eat at [place], [employer] or [employer].    Mark Singh is here to discuss his progress with his obesity treatment plan. He is on the [employer] and states he is following his eating plan approximately 60 % of the time. He states he is exercising 120 minutes 5 times per week.  He is seeing increase in the amount of weights he is able to lift at the gym.    OBJECTIVE: Visit Diagnoses: Problem List Items Addressed This Visit       Cardiovascular and Mediastinum   Hypertension associated with diabetes [facility])   Patient to reach out to cardiology.  BP of 116/68 at recent PCP appointment and BP of 108/61.  Patient to ask cardiology if medication adjustment is needed given his occasional low BP and dizziness he is feeling intermittently.         Endocrine   Type 2 diabetes mellitus with stage 4 chronic kidney disease, with long-term current use of insulin  (HCC) - Primary   Patient brought blood sugar log today.  His fasting blood sugar has been averaging 120s (low of 105).  PM sugar average around 150. Recent A1c of 6.6 on 01/23/24. Creatinine stable and slightly lower at 2.4.  He is on ozempic weekly at 1mg  and now on jardiance  10mg .  Continue current medications and dosage.        Other   Obesity with starting BMI of 39.2/date 07/06/22   Other Visit Diagnoses       BMI 32.0-32.9,adult            Vitals Temp: 97.7 F (36.5 C) BP: 108/61 Pulse Rate: (!) 59 SpO2: 96 %   Anthropometric Measurements Height: 5' 9 (1.753 m) Weight: 222 lb (100.7 kg) BMI (Calculated): 32.77 Weight at Last Visit: 224 lb Weight Lost Since Last Visit: 2 Weight Gained Since Last Visit: 0 Starting Weight: 265 lb Total Weight Loss (lbs): 43 lb (19.5 kg)   Body Composition  Body Fat %: 34.6 % Fat Mass (lbs): 76.8 lbs Muscle Mass (lbs): 138.2 lbs Total Body Water (lbs): 102.2 lbs Visceral Fat Rating : 22   Other Clinical Data Today's Visit #: 17 Starting Date: 07/06/22 Comments: Pesc     ASSESSMENT AND PLAN: Assessment & Plan Type 2 diabetes mellitus with stage 4 chronic kidney disease, with long-term current use of insulin  (HCC) Patient brought blood sugar log today.  His fasting blood sugar has been averaging 120s (low of 105).  PM sugar average around 150. Recent A1c of 6.6 on 01/23/24. Creatinine stable and slightly lower at 2.4.  He is on ozempic weekly at 1mg  and now on jardiance  10mg .  Continue current medications and dosage.  Hypertension associated with diabetes [facility]) Patient to reach out to cardiology.  BP of 116/68 at recent PCP  appointment and BP of 108/61.  Patient to ask cardiology if medication adjustment is needed given his occasional low BP and dizziness he is feeling intermittently.  Obesity with starting BMI of 39.2/date 07/06/22  BMI 32.0-32.9,adult    Diet: [place] is currently in the action stage of change. As such, his goal is to continue with weight loss efforts and has agreed to the [employer].   Exercise:  Older adults should determine their level of effort for physical activity relative to their level of fitness.  Behavior Modification:  We discussed the following Behavioral Modification Strategies today: increasing lean protein intake, decreasing simple carbohydrates, increasing vegetables, meal planning and cooking strategies, and planning for  success.   Return in about 10 weeks (around 04/09/2024).   He was informed of the importance of frequent follow up visits to maximize his success with intensive lifestyle modifications for his multiple health conditions.  Attestation Statements:   Reviewed by clinician on day of visit: allergies, medications, problem list, medical history, surgical history, family history, social history, and previous encounter notes.     Adelita Cho, MD

## 2024-02-06 ENCOUNTER — Other Ambulatory Visit: Payer: Self-pay

## 2024-02-06 NOTE — Telephone Encounter (Signed)
 Please see the MyChart message reply(ies) for my assessment and plan.   My first suggestion given some of the symptoms of dizziness and low blood pressure would be to stop the hydralazine  50 mg twice daily.  Lets try this first and see how you do. Med list updated  This patient gave consent for this Medical Advice Message and is aware that it may result in a bill to [employer], as well as the possibility of receiving a bill for a co-payment or deductible. They are an established patient, but are not seeking medical advice exclusively about a problem treated during an in person or video visit in the last seven days. I did not recommend an in person or video visit within seven days of my reply.    I spent a total of 5 minutes cumulative time within 7 days through [employer].  Oneil Parchment, MD

## 2024-02-08 MED ORDER — POTASSIUM CHLORIDE CRYS ER 20 MEQ PO TBCR: 20.0000 meq | EXTENDED_RELEASE_TABLET | Freq: Every day | ORAL | 3 refills | Status: AC

## 2024-02-13 DIAGNOSIS — M170 Bilateral primary osteoarthritis of knee: Secondary | ICD-10-CM | POA: Diagnosis not present

## 2024-02-26 DIAGNOSIS — E1122 Type 2 diabetes mellitus with diabetic chronic kidney disease: Secondary | ICD-10-CM | POA: Diagnosis not present

## 2024-02-26 DIAGNOSIS — Z6841 Body Mass Index (BMI) 40.0 and over, adult: Secondary | ICD-10-CM | POA: Diagnosis not present

## 2024-02-26 DIAGNOSIS — I4819 Other persistent atrial fibrillation: Secondary | ICD-10-CM | POA: Diagnosis not present

## 2024-02-26 DIAGNOSIS — I5030 Unspecified diastolic (congestive) heart failure: Secondary | ICD-10-CM | POA: Diagnosis not present

## 2024-02-26 DIAGNOSIS — N041 Nephrotic syndrome with focal and segmental glomerular lesions: Secondary | ICD-10-CM | POA: Diagnosis not present

## 2024-02-26 DIAGNOSIS — I10 Essential (primary) hypertension: Secondary | ICD-10-CM | POA: Diagnosis not present

## 2024-02-26 DIAGNOSIS — N184 Chronic kidney disease, stage 4 (severe): Secondary | ICD-10-CM | POA: Diagnosis not present

## 2024-03-05 ENCOUNTER — Other Ambulatory Visit: Payer: Self-pay | Admitting: Cardiology

## 2024-03-06 DIAGNOSIS — D6869 Other thrombophilia: Secondary | ICD-10-CM | POA: Diagnosis not present

## 2024-03-06 DIAGNOSIS — E1142 Type 2 diabetes mellitus with diabetic polyneuropathy: Secondary | ICD-10-CM | POA: Diagnosis not present

## 2024-03-06 DIAGNOSIS — E785 Hyperlipidemia, unspecified: Secondary | ICD-10-CM | POA: Diagnosis not present

## 2024-03-06 DIAGNOSIS — I4891 Unspecified atrial fibrillation: Secondary | ICD-10-CM | POA: Diagnosis not present

## 2024-03-06 DIAGNOSIS — E669 Obesity, unspecified: Secondary | ICD-10-CM | POA: Diagnosis not present

## 2024-03-06 DIAGNOSIS — N184 Chronic kidney disease, stage 4 (severe): Secondary | ICD-10-CM | POA: Diagnosis not present

## 2024-03-06 DIAGNOSIS — Z7901 Long term (current) use of anticoagulants: Secondary | ICD-10-CM | POA: Diagnosis not present

## 2024-03-06 DIAGNOSIS — I4892 Unspecified atrial flutter: Secondary | ICD-10-CM | POA: Diagnosis not present

## 2024-03-06 DIAGNOSIS — I272 Pulmonary hypertension, unspecified: Secondary | ICD-10-CM | POA: Diagnosis not present

## 2024-03-12 DIAGNOSIS — G4733 Obstructive sleep apnea (adult) (pediatric): Secondary | ICD-10-CM | POA: Diagnosis not present

## 2024-03-20 DIAGNOSIS — Z0000 Encounter for general adult medical examination without abnormal findings: Secondary | ICD-10-CM | POA: Diagnosis not present

## 2024-04-03 ENCOUNTER — Ambulatory Visit: Attending: Cardiology | Admitting: Cardiology

## 2024-04-03 ENCOUNTER — Encounter: Payer: Self-pay | Admitting: Cardiology

## 2024-04-03 VITALS — BP 120/60 | HR 56 | Ht 69.0 in | Wt 238.1 lb

## 2024-04-03 DIAGNOSIS — Z79899 Other long term (current) drug therapy: Secondary | ICD-10-CM | POA: Diagnosis not present

## 2024-04-03 DIAGNOSIS — I4819 Other persistent atrial fibrillation: Secondary | ICD-10-CM

## 2024-04-03 DIAGNOSIS — Z8679 Personal history of other diseases of the circulatory system: Secondary | ICD-10-CM

## 2024-04-03 DIAGNOSIS — I10 Essential (primary) hypertension: Secondary | ICD-10-CM | POA: Diagnosis not present

## 2024-04-03 DIAGNOSIS — E785 Hyperlipidemia, unspecified: Secondary | ICD-10-CM | POA: Diagnosis not present

## 2024-04-03 DIAGNOSIS — I493 Ventricular premature depolarization: Secondary | ICD-10-CM | POA: Diagnosis not present

## 2024-04-03 DIAGNOSIS — I483 Typical atrial flutter: Secondary | ICD-10-CM

## 2024-04-03 DIAGNOSIS — D6869 Other thrombophilia: Secondary | ICD-10-CM

## 2024-04-03 MED ORDER — SPIRONOLACTONE 25 MG PO TABS: 12.5000 mg | ORAL_TABLET | Freq: Every day | ORAL | 3 refills | Status: AC

## 2024-04-03 NOTE — Patient Instructions (Signed)
 Medication Instructions:  Your physician has recommended you make the following change in your medication:  STOP Amlodipine    *If you need a refill on your cardiac medications before your next appointment, please call your pharmacy*  Lab Work: None ordered  If you have any lab test that is abnormal or we need to change your treatment, we will call you to review the results.  Testing/Procedures: None ordered  Follow-Up: At [facility], you and your health needs are our priority.  As part of our continuing mission to provide you with exceptional heart care, our providers are all part of one team.  This team includes your primary Cardiologist (physician) and Advanced Practice Providers or APPs (Physician Assistants and Nurse Practitioners) who all work together to provide you with the care you need, when you need it.  Your next appointment:   6 month(s)  Provider:   AFib clinic or EP APP   Thank you for choosing [facility]!!   Maeola Domino, RN [phone]

## 2024-04-03 NOTE — Progress Notes (Signed)
 Electrophysiology Office Note:   Date:  04/03/2024  ID:  Mark Singh, DOB 1946/03/16, MRN [id number]  Primary Cardiologist: Oneil Parchment, MD Primary Heart Failure: None Electrophysiologist: Jacorie Ernsberger Gladis Norton, MD      History of Present Illness:   Mark Singh is a 78 y.o. male with h/o PVCs, nephrotic syndrome, diabetes, hypertension, hyperlipidemia, obesity, pulmonary hypertension seen today for routine electrophysiology followup.   Discussed the use of AI scribe software for clinical note transcription with the patient, who gave verbal consent to proceed.  History of Present Illness Mark Singh is a 78 year old male who presents with dizziness.  He experiences dizziness, particularly when standing up or performing certain activities. The dizziness has improved since starting his medication regimen, although it persists to some extent.  He is currently taking losartan  and amlodipine , but has not taken amlodipine  for about a week due to a refill issue. He is unsure why the medication was not refilled.  He reports that his kidneys are about the same and that his nephrologist feels comfortable with his current condition.  No new symptoms or concerns beyond the dizziness.  He has noted no further episodes of atrial fibrillation.   he denies chest pain, palpitations, dyspnea, PND, orthopnea, nausea, vomiting, syncope, edema, weight gain, or early satiety.   Review of systems complete and found to be negative unless listed in HPI.   EP Information / Studies Reviewed:    EKG is ordered today. Personal review as below.  EKG Interpretation Date/Time:  Wednesday April 03 2024 16:24:18 EST Ventricular Rate:  56 PR Interval:    QRS Duration:  96 QT Interval:  424 QTC Calculation: 409 R Axis:   54  Text Interpretation: Sinus bradycardia No significant change since last tracing Confirmed by Kamauri Denardo ([id number]) on 04/03/2024 4:26:24 PM    Risk  Assessment/Calculations:    CHA2DS2-VASc Score = 4   This indicates a 4.8% annual risk of stroke. The patient's score is based upon: CHF History: 0 HTN History: 1 Diabetes History: 1 Stroke History: 0 Vascular Disease History: 0 Age Score: 2 Gender Score: 0            Physical Exam:   VS:  BP 120/60 (BP Location: Right Arm, Patient Position: Sitting, Cuff Size: Large)   Pulse (!) 56   Ht 5' 9 (1.753 m)   Wt 238 lb 1.6 oz (108 kg)   SpO2 95%   BMI 35.16 kg/m    Wt Readings from Last 3 Encounters:  04/03/24 238 lb 1.6 oz (108 kg)  01/30/24 222 lb (100.7 kg)  12/11/23 228 lb 6.4 oz (103.6 kg)     GEN: Well nourished, well developed in no acute distress NECK: No JVD; No carotid bruits CARDIAC: Regular rate and rhythm, no murmurs, rubs, gallops RESPIRATORY:  Clear to auscultation without rales, wheezing or rhonchi  ABDOMEN: Soft, non-tender, non-distended EXTREMITIES:  No edema; No deformity   ASSESSMENT AND PLAN:    1.  PVCs: Post ablation in 2016.  No obvious recurrence.  2.  Persistent atrial fibrillation: Post ablation 12/23/2017.  On amiodarone .  Remains in sinus rhythm.  No changes.  3.  Typical atrial flutter: Post ablation 03/29/2018  4.  Chronic diastolic heart failure: No volume overload  5.  CKD stage IV-V: Followed by nephrology  6.  Hypertension: Well-controlled.  He is having some dizziness upon standing.  Filomena Pokorney stop Norvasc  today.  7.  Obstructive sleep apnea: CPAP compliance encouraged  8.  Secondary hypercoagulable state: On Xarelto   9.  Aortic stenosis: Moderate on most recent echo.  Plan per primary cardiology.  Follow up with EP Team in 6 months  Signed, Nasrin Lanzo Gladis Norton, MD

## 2024-04-03 NOTE — Addendum Note (Signed)
 Addended by: GRETEL MAEOLA CROME on: 04/03/2024 04:51 PM   Modules accepted: Orders

## 2024-04-05 ENCOUNTER — Ambulatory Visit: Admitting: Cardiology

## 2024-04-16 ENCOUNTER — Ambulatory Visit (INDEPENDENT_AMBULATORY_CARE_PROVIDER_SITE_OTHER): Payer: Self-pay | Admitting: Family Medicine

## 2024-04-16 ENCOUNTER — Encounter (INDEPENDENT_AMBULATORY_CARE_PROVIDER_SITE_OTHER): Payer: Self-pay | Admitting: Family Medicine

## 2024-04-16 VITALS — BP 108/51 | HR 54 | Temp 97.6°F | Ht 69.0 in | Wt 226.0 lb

## 2024-04-16 DIAGNOSIS — Z6833 Body mass index (BMI) 33.0-33.9, adult: Secondary | ICD-10-CM | POA: Diagnosis not present

## 2024-04-16 DIAGNOSIS — I129 Hypertensive chronic kidney disease with stage 1 through stage 4 chronic kidney disease, or unspecified chronic kidney disease: Secondary | ICD-10-CM | POA: Diagnosis not present

## 2024-04-16 DIAGNOSIS — E1122 Type 2 diabetes mellitus with diabetic chronic kidney disease: Secondary | ICD-10-CM | POA: Diagnosis not present

## 2024-04-16 DIAGNOSIS — E1159 Type 2 diabetes mellitus with other circulatory complications: Secondary | ICD-10-CM

## 2024-04-16 DIAGNOSIS — E669 Obesity, unspecified: Secondary | ICD-10-CM

## 2024-04-16 DIAGNOSIS — I152 Hypertension secondary to endocrine disorders: Secondary | ICD-10-CM

## 2024-04-16 DIAGNOSIS — N184 Chronic kidney disease, stage 4 (severe): Secondary | ICD-10-CM | POA: Diagnosis not present

## 2024-04-16 DIAGNOSIS — Z7985 Long-term (current) use of injectable non-insulin antidiabetic drugs: Secondary | ICD-10-CM | POA: Diagnosis not present

## 2024-04-16 DIAGNOSIS — Z794 Long term (current) use of insulin: Secondary | ICD-10-CM

## 2024-04-16 NOTE — Progress Notes (Signed)
 SUBJECTIVE:  Chief Complaint: Obesity  Interim History: Patient felt he did really well following meal plan until Thanksgiving and then he bought some food and indulgent options that were at his house.  He was eating more cookies and drinking more Coke.  He mentions most of that indulgent food is out of the house.  Patient denies any plans in the upcoming weeks to get together with anyone or travel.  He was going to the gym even when eating more indulgently.  Food is at home that is nutritious.  Mark Singh is here to discuss his progress with his obesity treatment plan. He is on the [employer] and states he is following his eating plan approximately 40 % of the time. He states he is exercising 120 minutes 5 times per week.   OBJECTIVE: Visit Diagnoses: Problem List Items Addressed This Visit       Cardiovascular and Mediastinum   Hypertension associated with diabetes (HCC)     Endocrine   Type 2 diabetes mellitus with stage 4 chronic kidney disease, with long-term current use of insulin  (HCC) - Primary     Other   Obesity with starting BMI of 39.2/date 07/06/22   Other Visit Diagnoses       BMI 33.0-33.9,adult           Vitals Temp: 97.6 F (36.4 C) BP: (!) 108/51 Pulse Rate: (!) 54 SpO2: 97 %   Anthropometric Measurements Height: 5' 9 (1.753 m) Weight: 226 lb (102.5 kg) BMI (Calculated): 33.36 Weight at Last Visit: 222 lbs Weight Lost Since Last Visit: 0 Weight Gained Since Last Visit: 4 Starting Weight: 265 lb Total Weight Loss (lbs): 39 lb (17.7 kg)   Body Composition  Body Fat %: 35.1 % Fat Mass (lbs): 79.4 lbs Muscle Mass (lbs): 139.8 lbs Total Body Water (lbs): 101.6 lbs Visceral Fat Rating : 23   Other Clinical Data Today's Visit #: 18 Starting Date: 07/06/22 Comments: Pesc     ASSESSMENT AND PLAN: Assessment & Plan Type 2 diabetes mellitus with stage 4 chronic kidney disease, with long-term current use of insulin  (HCC) Patient bought  quite a bit of simple carbohydrate foods around Thanksgiving and his blood sugars have increased.  Blood sugar starting around 12/4 160 then 170, 147, 150, 136, 148 and 127.  Prior blood sugars have been in 120s.  Feeling tired of simple carbs now.  Hypertension associated with diabetes (HCC) Since last appointment patient is off hydralazine  and amlodipine  due to blood pressures being lower and having dizziness.  BP still lower range of normal today.  He has a BP cuff at home- will intermittently check BP at home and message cardiology after new year if BP remains lower end of normal. BMI 33.0-33.9,adult  Obesity with starting BMI of 39.2/date 07/06/22    Diet: Mark Singh is currently in the action stage of change. As such, his goal is to continue with weight loss efforts and has agreed to the [employer].   Exercise:  Older adults should determine their level of effort for physical activity relative to their level of fitness.  Behavior Modification:  We discussed the following Behavioral Modification Strategies today: increasing lean protein intake, decreasing simple carbohydrates, meal planning and cooking strategies, keeping healthy foods in the home, and planning for success.   Return in about 11 weeks (around 07/02/2024).   He was informed of the importance of frequent follow up visits to maximize his success with intensive lifestyle modifications for his multiple health conditions.  Attestation Statements:   Reviewed by clinician on day of visit: allergies, medications, problem list, medical history, surgical history, family history, social history, and previous encounter notes.  Adelita Cho, MD

## 2024-04-16 NOTE — Assessment & Plan Note (Signed)
 Since last appointment patient is off hydralazine  and amlodipine  due to blood pressures being lower and having dizziness.  BP still lower range of normal today.  He has a BP cuff at home- will intermittently check BP at home and message cardiology after new year if BP remains lower end of normal.

## 2024-04-16 NOTE — Assessment & Plan Note (Signed)
 Patient bought quite a bit of simple carbohydrate foods around Thanksgiving and his blood sugars have increased.  Blood sugar starting around 12/4 160 then 170, 147, 150, 136, 148 and 127.  Prior blood sugars have been in 120s.  Feeling tired of simple carbs now.

## 2024-04-28 ENCOUNTER — Other Ambulatory Visit: Payer: Self-pay | Admitting: Cardiology

## 2024-05-16 ENCOUNTER — Other Ambulatory Visit: Payer: Self-pay | Admitting: Cardiology

## 2024-05-16 DIAGNOSIS — I480 Paroxysmal atrial fibrillation: Secondary | ICD-10-CM

## 2024-05-30 ENCOUNTER — Other Ambulatory Visit: Payer: Self-pay | Admitting: Internal Medicine

## 2024-05-30 ENCOUNTER — Other Ambulatory Visit: Payer: Self-pay | Admitting: Cardiology

## 2024-05-30 DIAGNOSIS — I480 Paroxysmal atrial fibrillation: Secondary | ICD-10-CM

## 2024-05-30 NOTE — Progress Notes (Unsigned)
 "  Patient ID: Mark Singh MRN: [id number] DOB/AGE: 80-Aug-1947 80 y.o.  Primary Care Physician:Miller, Olam, MD Primary Cardiologist: Jeffrie Anes, MD  CC: Follow-up for aortic stenosis  FOCUSED CARDIOVASCULAR PROBLEM LIST:   Aortic stenosis AVA 1.2, MG 19, EF 55 to 60% TTE 2023 AVA 1, MG 19, V-max 3 with SVI 29, EF 55 to 60% TTE 2024 AVA 1.08, MG 26, DVI 0.29, SVI 40 EF 65 to 70% TTE August 2025 ***TTE 2026 Atrial fibrillation On Eliquis PVI 2019 Atrial flutter CTI 2019 PVC  Ablation 2016 Hypertension Hyperlipidemia LP(a) 26.3 CKD stage IV Followed by nephrology (Dr. Rayburn) Type 2 diabetes Not on insulin  OSA On CPAP BMI 35  HISTORY OF PRESENT ILLNESS: January 2024: The patient is a 79 y.o. male with the indicated medical history here for recommendations regarding his aortic stenosis and tricuspid regurgitation.  He was seen by cardiology in early December due to worsening shortness of breath.  He usually is able to walk a mile however he has been able to do this over the last several weeks.  The patient has noticed increasing fatigue and shortness of breath with walking.  He has had no presyncope or syncope.  He is also noted some increasing peripheral edema.  He denies any orthopnea.  He has had no severe bleeding or bruising episodes while on Xarelto .  He is able to do most of his activities of daily living but he likes to exercise 3-4 times a week and has noticed increasing fatigue and shortness of breath with this activity that he enjoys.  He fortunately has not required any emergency room visits or hospitalizations for breathing issues or volume status.  He is retired for 8 years.  He was in [employer].  He does not smoke.  He sees a education officer, community on a regular basis and reports good dental health.  Plan: Obtain TAVR protocol CTA and staged coronary angiography if coronary arteries are not able to be assessed on CTA.  5/24: In the interim the  patient's nephrologist was contacted regarding the possibility of obtaining a TAVR protocol CTA.  Given his poor kidney function they thought that this would greatly increase the his risk of progression to requiring renal replacement therapy.  For this reason the CT scan was deferred.  The patient joined an exercise program and feels much better.  He is able to toe walk and do most of his activities of daily living without any issues.  He fortunately has not required any emergency room visits or hospitalizations.  He tells me he feels much better than when he saw me before.  Plan: Follow-up in 9 months.  2/25: The patient returns for routine follow-up.  He was seen by his PCP recently and was doing well.  His blood pressure was well-controlled.  The patient has lost about 30 pounds on Ozempic.  He feels much better.  He no longer needs insulin .  He is able to walk on a treadmill at around 2-1/2 miles an hour without any issues.  If he goes above the speed he will feel short of breath.  He exercises with a trainer and has noticed that his dizziness is much improved.  He denies any shortness of breath at rest, significant paroxysmal nocturnal dyspnea, orthopnea.  He has had no signs or symptoms of stroke and denies any severe bleeding or bruising.  He is otherwise well and without significant cardiovascular complaints today.  Plan: Follow-up in 6 months with echocardiogram; check lipid  panel, LFTs, LP(a).  August 2025:  Patient consents to use of AI scribe. The patient's LDL was 40 and the LP(a) was not elevated.  He was seen by general cardiology in June and his losartan  was decreased to 25 mg.  No chest pain or shortness of breath. He experiences episodes of lightheadedness, particularly when looking up at a menu or after walking and then sitting down. No lightheadedness occurs when transitioning from sitting to standing or from lying down to sitting.  He has stage four kidney disease with stable kidney  function based on recent lab results. He is not on dialysis. He takes losartan , which was reduced from 50 mg to 25 mg, and his blood pressure is well-controlled.  He is on Xarelto  for anticoagulation and experiences normal bruising with no significant bleeding issues. He has lost 41 pounds with the use of Ozempic and feels better overall. He no longer requires insulin , and his last A1c was 6.7.  He has a history of sleep apnea and continues to use a CPAP machine, although it causes some sleep disturbances. He has not discussed the potential resolution of sleep apnea since his weight loss.  Plan: Follow-up in 6 months.  February 2026:  Patient consents to use of AI scribe. The patient's last echocardiogram demonstrated moderate aortic stenosis.  He called the office and due to dizziness his hydralazine  was stopped.  He was seen by EP and no changes were made to his medical regimen.  Past Medical History:  Diagnosis Date   Arthritis    hands, feet (12/13/2017)   Asthma    Atrial fibrillation (HCC)    Bradycardia    a. Repoted h/o HR in the 40s.   Constipation    Edema    a. 2D echo 11/2013: EF 55-60%, LV diastolic function parameters were normal, severely dilated LA, PASP ..   H/O: gout    right foot; on daily RX (12/13/2017)   Heart valve problem    HTN (hypertension)    Hyperlipidemia    Joint pain    Kidney disease, chronic, stage IV (GFR 15-29 ml/min) (HCC)    Myalgia and myositis, unspecified    Nephrotic syndrome    a. responder to steroid therapy by Dr. Betsey.    Obesity    Orthostatic hypotension    Penile cancer (HCC)    did circ   Pulmonary hypertension (HCC)    a. Mild pulm HTN felt 2/2 obesity.   PVC's (premature ventricular contractions)    a. Holter 05/2013 showing 18k PVCs (18% of the time).   Seasonal asthma    mild   Shoulder pain    Sleep apnea    SOB (shortness of breath)    Squamous carcinoma    right hand; left shoulder (12/13/2017)   Type 2  diabetes mellitus (HCC)     Past Surgical History:  Procedure Laterality Date   A-FLUTTER ABLATION N/A 02/23/2018   Procedure: A-FLUTTER ABLATION;  Surgeon: Inocencio Soyla Lunger, MD;  Location: [facility];  Service: Cardiovascular;  Laterality: N/A;   ATRIAL FIBRILLATION ABLATION N/A 12/13/2017   Procedure: ATRIAL FIBRILLATION ABLATION;  Surgeon: Inocencio Soyla Lunger, MD;  Location: [facility];  Service: Cardiovascular;  Laterality: N/A;   BUNIONECTOMY WITH HAMMERTOE RECONSTRUCTION Left 1992  &  2008   REMOVAL HEAL SPUR/ BUNIONECTOMY AND HAMMERTOE REPAIR    CARDIOVERSION N/A 07/22/2015   Procedure: CARDIOVERSION;  Surgeon: Leim VEAR Moose, MD;  Location: [facility] ENDOSCOPY;  Service: Cardiovascular;  Laterality: N/A;   CARDIOVERSION N/A 10/06/2015   Procedure: CARDIOVERSION;  Surgeon: Wilbert JONELLE Bihari, MD;  Location: [facility] ENDOSCOPY;  Service: Cardiovascular;  Laterality: N/A;   CARDIOVERSION N/A 12/05/2016   Procedure: CARDIOVERSION;  Surgeon: Mona Vinie BROCKS, MD;  Location: [facility] ENDOSCOPY;  Service: Cardiovascular;  Laterality: N/A;   CARDIOVERSION N/A 06/29/2018   Procedure: CARDIOVERSION;  Surgeon: Alveta Aleene PARAS, MD;  Location: [facility] ENDOSCOPY;  Service: Cardiovascular;  Laterality: N/A;   CARDIOVERSION N/A 09/20/2018   Procedure: CARDIOVERSION;  Surgeon: Raford Riggs, MD;  Location: [facility] ENDOSCOPY;  Service: Cardiovascular;  Laterality: N/A;   CARDIOVERSION N/A 03/24/2021   Procedure: CARDIOVERSION;  Surgeon: Hobart Powell BRAVO, MD;  Location: [facility] ENDOSCOPY;  Service: Cardiovascular;  Laterality: N/A;   CATARACT EXTRACTION     CIRCUMCISION N/A 11/27/2012   Procedure:  CIRCUMCISION  AND EXCISION OF GLANS PENIS;  Surgeon: Donnice Gwenyth Brooks, MD;  Location: [facility];  Service: Urology;  Laterality: N/A;   ELECTROPHYSIOLOGIC STUDY N/A 03/30/2015   Procedure: PVC Ablation;  Surgeon: Will Gladis Norton, MD;  Location: [facility];  Service:  Cardiovascular;  Laterality: N/A;   HERNIA REPAIR     SQUAMOUS CELL CARCINOMA EXCISION     right hand; left shoulder (12/13/2017)   UMBILICAL HERNIA REPAIR  11/04/2005    Family History  Problem Relation Age of Onset   Coronary artery disease Mother    Stroke Father    Hypertension Father    Diabetes Father    CVA Father    Heart disease Father     Social History   Socioeconomic History   Marital status: Widowed    Spouse name: Not on file   Number of children: Not on file   Years of education: Not on file   Highest education level: Not on file  Occupational History   Occupation: Land[employer]per-Retired  Tobacco Use   Smoking status: Never   Smokeless tobacco: Never   Tobacco comments:    Never smoke 07/15/21  Vaping Use   Vaping status: Never Used  Substance and Sexual Activity   Alcohol use: Never   Drug use: Never   Sexual activity: Not Currently  Other Topics Concern   Not on file  Social History Narrative   Not on file   Social Drivers of Health   Tobacco Use: Low Risk (04/16/2024)   Patient History    Smoking Tobacco Use: Never    Smokeless Tobacco Use: Never    Passive Exposure: Not on file  Financial Resource Strain: Not on file  Food Insecurity: Not on file  Transportation Needs: Not on file  Physical Activity: Not on file  Stress: Not on file  Social Connections: Not on file  Intimate Partner Violence: Not on file  Depression (EYV7-0): Not on file  Alcohol Screen: Not on file  Housing: Not on file  Utilities: Not on file  Health Literacy: Not on file     Prior to Admission medications   Medication Sig Start Date End Date Taking? Authorizing Provider  acetaminophen  (TYLENOL ) 500 MG tablet Take 1,000 mg by mouth every 6 (six) hours as needed for moderate pain.    [provider]  allopurinol (ZYLOPRIM) 100 MG tablet Take 50 mg by mouth every other day.    [provider]  amiodarone  (PACERONE ) 200 MG tablet Take 0.5 tablets (100  mg total) by mouth daily. 03/02/22   Camnitz, Soyla Gladis, MD  amLODipine  (NORVASC ) 10 MG tablet TAKE 1 TABLET BY  MOUTH EVERY DAY 08/24/20   Leverne Charlies Helling, PA-C  atorvastatin (LIPITOR) 40 MG tablet Take 40 mg by mouth daily.    [provider]  BD PEN NEEDLE NANO 2ND GEN 32G X 4 MM MISC  12/23/19   [provider]  betamethasone  valerate ointment (VALISONE ) 0.1 % 1 application 2 (two) times daily as needed (precancerous spots). 11/25/19   [provider]  Cyanocobalamin  (B-12 PO) Take 1 capsule by mouth daily.    [provider]  Dulaglutide (TRULICITY) 1.5 MG/0.5ML SOPN Inject 1.5 mg into the skin every Wednesday.    [provider]  hydrALAZINE  (APRESOLINE ) 50 MG tablet Take 1 tablet (50 mg total) by mouth 2 (two) times daily. Please contact the office to schedule appointment for additional refills. 03/15/22   Jeffrie Oneil BROCKS, MD  KLOR-CON  M20 20 MEQ tablet TAKE 1 TABLET BY MOUTH EVERY DAY 05/18/21   Jeffrie Oneil BROCKS, MD  LANTUS  SOLOSTAR 100 UNIT/ML Solostar Pen Inject 30 Units into the skin at bedtime. 02/08/18   [provider]  losartan  (COZAAR ) 50 MG tablet TAKE 1 TABLET BY MOUTH EVERY DAY 04/01/22   Jeffrie Oneil BROCKS, MD  omeprazole (PRILOSEC) 20 MG capsule Take 20 mg by mouth daily. 06/25/18   [provider]  OneTouch Delica Lancets 33G MISC USE AS DIRECTED EVERY DAY 12/16/18   [provider]  AISHA VERIO test strip USE AS DIRECTED (TEST DAILY) 90 **E11.9 01/20/19   [provider]  oxymetazoline (AFRIN) 0.05 % nasal spray Place 1 spray into both nostrils 2 (two) times daily as needed for congestion.    [provider]  pimecrolimus (ELIDEL) 1 % cream Apply 1 application topically 2 (two) times daily as needed (psoriasis).     [provider]  spironolactone  (ALDACTONE ) 25 MG tablet Take 0.5 tablets (12.5 mg total) by mouth daily. 04/18/22   Jeffrie Oneil BROCKS, MD  tamsulosin  (FLOMAX ) 0.4 MG CAPS capsule  Take 0.4 mg by mouth every evening.  03/26/16   [provider]  torsemide  (DEMADEX ) 20 MG tablet TAKE 3 TABLETS BY MOUTH EVERY DAY 05/18/21   Jeffrie Oneil BROCKS, MD  VITAMIN D  PO Take 1 capsule by mouth daily.    [provider]  NAPOLEON INHUB 100-50 MCG/DOSE AEPB Inhale 1 puff into the lungs 2 (two) times daily. 09/28/18   [provider]  XARELTO  15 MG TABS tablet TAKE 1 TABLET (15 MG TOTAL) BY MOUTH DAILY WITH SUPPER 11/26/21   Jeffrie Oneil BROCKS, MD    Allergies  Allergen Reactions   Indomethacin Other (See Comments)    dizziness    REVIEW OF SYSTEMS:  General: no fevers/chills/night sweats Eyes: no blurry vision, diplopia, or amaurosis ENT: no sore throat or hearing loss Resp: no cough, wheezing, or hemoptysis CV: no edema or palpitations GI: no abdominal pain, nausea, vomiting, diarrhea, or constipation GU: no dysuria, frequency, or hematuria Skin: no rash Neuro: no headache, numbness, tingling, or weakness of extremities Musculoskeletal: no joint pain or swelling Heme: no bleeding, DVT, or easy bruising Endo: no polydipsia or polyuria  There were no vitals taken for this visit.  PHYSICAL EXAM: GEN:  AO x 3 in no acute distress HEENT: normal Dentition: Normal Neck: JVP normal. +2 carotid upstrokes without bruits. No thyromegaly. Lungs: equal expansion, clear bilaterally CV: Apex is discrete and nondisplaced, RRR with very soft systolic ejection murmur Abd: soft, non-tender, non-distended; no bruit; positive bowel sounds Ext: no edema, ecchymoses, or cyanosis Vascular: 2+  femoral pulses, 2+ radial pulses       Skin: warm and dry without rash Neuro: CN II-XII grossly intact; motor and sensory grossly intact    DATA AND STUDIES:  EKG: EKG done toay demonstrates NSR  2D ECHO:  2025:  1. Left ventricular ejection fraction, by estimation, is 60 to 65%. The  left ventricle has normal function. The left ventricle has no regional  wall motion  abnormalities. Left ventricular diastolic parameters were  normal.   2. Right ventricular systolic function is normal. The right ventricular  size is normal.   3. Left atrial size was severely dilated.   4. Right atrial size was moderately dilated.   5. The mitral valve is abnormal. Trivial mitral valve regurgitation. No  evidence of mitral stenosis.   6. Tricuspid valve regurgitation is moderate.   7. Gradients similar to TTE done 09/19/22. The aortic valve is tricuspid.  There is moderate calcification of the aortic valve. There is moderate  thickening of the aortic valve. Aortic valve regurgitation is trivial.  Moderate aortic valve stenosis.   8. The inferior vena cava is normal in size with greater than 50%  respiratory variability, suggesting right atrial pressure of 3 mmHg.   Aortic Valve: Gradients similar to TTE done 09/19/22. The aortic valve is  tricuspid. There is moderate calcification of the aortic valve. There is  moderate thickening of the aortic valve. Aortic valve regurgitation is  trivial. Moderate aortic stenosis is  present. Aortic valve mean gradient measures 25.8 mmHg. Aortic valve peak  gradient measures 48.5 mmHg. Aortic valve area, by VTI measures 1.34 cm.    2023 1. Left ventricular ejection fraction, by estimation, is 55 to 60%. The  left ventricle has normal function. The left ventricle has no regional  wall motion abnormalities. Left ventricular diastolic parameters are  consistent with Grade II diastolic  dysfunction (pseudonormalization). The average left ventricular global  longitudinal strain is -23.7 %. The global longitudinal strain is normal.   2. Right ventricular systolic function is normal. The right ventricular  size is moderately enlarged. There is moderately elevated pulmonary artery  systolic pressure. The estimated right ventricular systolic pressure is  46.7 mmHg.   3. Left atrial size was severely dilated.   4. Right atrial size was  severely dilated.   5. The mitral valve is normal in structure. Mild mitral valve  regurgitation. No evidence of mitral stenosis.   6. Tricuspid valve regurgitation is moderate to severe.   7. The aortic valve is tricuspid. There is mild calcification of the  aortic valve. There is moderate thickening of the aortic valve. Aortic  valve regurgitation is not visualized. Moderate aortic valve stenosis.   8. The inferior vena cava is dilated in size with >50% respiratory  variability, suggesting right atrial pressure of 8 mmHg.   CARDIAC CATH: n/a  STS RISK CALCULATOR: pending  NHYA CLASS: ***    ASSESSMENT AND PLAN:   Nonrheumatic aortic valve stenosis  Persistent atrial fibrillation (HCC)  Hypercoagulable state due to longstanding persistent atrial fibrillation (HCC)  Type 2 diabetes mellitus with complication, without long-term current use of insulin  (HCC)  Hypertension associated with diabetes (HCC)  Hyperlipidemia associated with type 2 diabetes mellitus (HCC)  CKD (chronic kidney disease) stage 4, GFR 15-29 ml/min (HCC)  BMI 35.0-35.9,adult   Aortic stenosis: *** Persistent atrial fibrillation: Followed by EP.  Currently on Xarelto  15 mg and amiodarone  100 mg. Secondary hypercoagulable state: Continue Xarelto  15 mg. Type 2 diabetes mellitus:  Continue Xarelto  15 mg, losartan  25 mg, atorvastatin 40 mg.  Defer Jardiance  pending renal opinion Hypertension: Continue amlodipine  5 mg, losartan  25 mg, and spironolactone  25 mg. Hyperlipidemia: Continue atorvastatin 40 mg, LDL was 40 in February 2025 CKD stage IV: Continue losartan  25 mg; Defer Jardiance  pending renal opinion Elevated BMI: Continue Ozempic 1 mg q. Weekly  I spent *** minutes reviewing all clinical data during and prior to this visit including all relevant imaging studies, laboratories, clinical information from other health systems and prior notes from both Cardiology and other specialties, interviewing the  patient, conducting a complete physical examination, and coordinating care in order to formulate a comprehensive and personalized evaluation and treatment plan.   Mark Vitanza K Ivah Girardot, MD  05/30/2024 8:27 AM    [facility] [address] [place], [place], [place]  [address] Phone: [phone]; Fax: [phone]    "

## 2024-05-30 NOTE — Telephone Encounter (Signed)
 In accordance with refill protocols, please review and address the following requirements before this medication refill can be authorized:  Labs   eGFR is between 30 and 150 and within 365 days   eGFR done 01/24/24 (27)

## 2024-06-01 ENCOUNTER — Other Ambulatory Visit: Payer: Self-pay | Admitting: Cardiology

## 2024-06-03 ENCOUNTER — Ambulatory Visit (HOSPITAL_COMMUNITY)

## 2024-06-03 ENCOUNTER — Ambulatory Visit: Admitting: Internal Medicine

## 2024-06-03 DIAGNOSIS — I152 Hypertension secondary to endocrine disorders: Secondary | ICD-10-CM

## 2024-06-03 DIAGNOSIS — I4819 Other persistent atrial fibrillation: Secondary | ICD-10-CM

## 2024-06-03 DIAGNOSIS — E118 Type 2 diabetes mellitus with unspecified complications: Secondary | ICD-10-CM

## 2024-06-03 DIAGNOSIS — N184 Chronic kidney disease, stage 4 (severe): Secondary | ICD-10-CM

## 2024-06-03 DIAGNOSIS — E1169 Type 2 diabetes mellitus with other specified complication: Secondary | ICD-10-CM

## 2024-06-03 DIAGNOSIS — I4811 Longstanding persistent atrial fibrillation: Secondary | ICD-10-CM

## 2024-06-03 DIAGNOSIS — Z6835 Body mass index (BMI) 35.0-35.9, adult: Secondary | ICD-10-CM

## 2024-06-03 DIAGNOSIS — I350 Nonrheumatic aortic (valve) stenosis: Secondary | ICD-10-CM

## 2024-07-09 ENCOUNTER — Ambulatory Visit (INDEPENDENT_AMBULATORY_CARE_PROVIDER_SITE_OTHER): Admitting: Family Medicine

## 2024-07-12 ENCOUNTER — Ambulatory Visit (HOSPITAL_COMMUNITY)

## 2024-07-17 ENCOUNTER — Ambulatory Visit: Admitting: Internal Medicine
